# Patient Record
Sex: Male | Born: 1935 | Race: White | Hispanic: No | Marital: Married | Smoking: Former smoker
Health system: Southern US, Community
[De-identification: ages and names within clinical notes are randomized; demographics above are authoritative.]

## PROBLEM LIST (undated history)

## (undated) DIAGNOSIS — Z9841 Cataract extraction status, right eye: Secondary | ICD-10-CM

## (undated) DIAGNOSIS — N138 Other obstructive and reflux uropathy: Secondary | ICD-10-CM

## (undated) DIAGNOSIS — N433 Hydrocele, unspecified: Secondary | ICD-10-CM

## (undated) DIAGNOSIS — M1990 Unspecified osteoarthritis, unspecified site: Secondary | ICD-10-CM

## (undated) DIAGNOSIS — N3941 Urge incontinence: Secondary | ICD-10-CM

## (undated) DIAGNOSIS — N401 Enlarged prostate with lower urinary tract symptoms: Secondary | ICD-10-CM

## (undated) DIAGNOSIS — I499 Cardiac arrhythmia, unspecified: Secondary | ICD-10-CM

## (undated) DIAGNOSIS — I219 Acute myocardial infarction, unspecified: Secondary | ICD-10-CM

## (undated) DIAGNOSIS — E785 Hyperlipidemia, unspecified: Secondary | ICD-10-CM

## (undated) DIAGNOSIS — N289 Disorder of kidney and ureter, unspecified: Secondary | ICD-10-CM

## (undated) DIAGNOSIS — Z9842 Cataract extraction status, left eye: Secondary | ICD-10-CM

## (undated) DIAGNOSIS — I200 Unstable angina: Secondary | ICD-10-CM

## (undated) DIAGNOSIS — N189 Chronic kidney disease, unspecified: Secondary | ICD-10-CM

## (undated) DIAGNOSIS — J810 Acute pulmonary edema: Secondary | ICD-10-CM

## (undated) DIAGNOSIS — R3915 Urgency of urination: Principal | ICD-10-CM

## (undated) DIAGNOSIS — I639 Cerebral infarction, unspecified: Secondary | ICD-10-CM

## (undated) DIAGNOSIS — I10 Essential (primary) hypertension: Secondary | ICD-10-CM

## (undated) DIAGNOSIS — I509 Heart failure, unspecified: Secondary | ICD-10-CM

## (undated) DIAGNOSIS — R0600 Dyspnea, unspecified: Secondary | ICD-10-CM

## (undated) DIAGNOSIS — F419 Anxiety disorder, unspecified: Secondary | ICD-10-CM

## (undated) HISTORY — DX: Cerebral infarction, unspecified: I63.9

## (undated) HISTORY — PX: HERNIA REPAIR: SHX51

## (undated) HISTORY — DX: Benign prostatic hyperplasia with lower urinary tract symptoms: N40.1

## (undated) HISTORY — PX: VASECTOMY: SHX75

## (undated) HISTORY — PX: TONSILLECTOMY: SUR1361

## (undated) HISTORY — DX: Heart failure, unspecified: I50.9

## (undated) HISTORY — DX: Essential (primary) hypertension: I10

## (undated) HISTORY — DX: Unstable angina: I20.0

## (undated) HISTORY — DX: Hyperlipidemia, unspecified: E78.5

## (undated) HISTORY — DX: Chronic kidney disease, unspecified: N18.9

## (undated) HISTORY — DX: Acute pulmonary edema: J81.0

## (undated) HISTORY — PX: EYE SURGERY: SHX253

## (undated) HISTORY — DX: Urge incontinence: N39.41

## (undated) HISTORY — DX: Urgency of urination: R39.15

## (undated) HISTORY — DX: Hydrocele, unspecified: N43.3

## (undated) HISTORY — PX: TONSILLECTOMY: SHX5217

## (undated) HISTORY — DX: Other obstructive and reflux uropathy: N13.8

---
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
## 1993-05-27 HISTORY — PX: UMBILICAL HERNIA REPAIR: SHX196

## 2004-05-27 HISTORY — PX: ANKLE FRACTURE SURGERY: SHX122

## 2006-09-04 ENCOUNTER — Ambulatory Visit: Payer: Self-pay | Admitting: Orthopaedic Surgery

## 2010-03-29 ENCOUNTER — Emergency Department: Payer: Self-pay | Admitting: Emergency Medicine

## 2012-02-17 ENCOUNTER — Ambulatory Visit: Payer: Self-pay

## 2012-02-24 DIAGNOSIS — N138 Other obstructive and reflux uropathy: Secondary | ICD-10-CM

## 2012-02-24 HISTORY — DX: Benign prostatic hyperplasia with lower urinary tract symptoms: N13.8

## 2012-02-25 DIAGNOSIS — N433 Hydrocele, unspecified: Secondary | ICD-10-CM

## 2012-02-25 HISTORY — DX: Hydrocele, unspecified: N43.3

## 2012-04-24 DIAGNOSIS — I639 Cerebral infarction, unspecified: Secondary | ICD-10-CM

## 2012-04-24 HISTORY — DX: Cerebral infarction, unspecified: I63.9

## 2012-04-28 ENCOUNTER — Encounter (INDEPENDENT_AMBULATORY_CARE_PROVIDER_SITE_OTHER): Payer: Medicare Other | Admitting: Ophthalmology

## 2012-04-28 DIAGNOSIS — H3400 Transient retinal artery occlusion, unspecified eye: Secondary | ICD-10-CM

## 2012-04-28 DIAGNOSIS — H43819 Vitreous degeneration, unspecified eye: Secondary | ICD-10-CM

## 2012-04-28 DIAGNOSIS — H35039 Hypertensive retinopathy, unspecified eye: Secondary | ICD-10-CM

## 2012-04-28 DIAGNOSIS — H34239 Retinal artery branch occlusion, unspecified eye: Secondary | ICD-10-CM

## 2012-04-28 DIAGNOSIS — I10 Essential (primary) hypertension: Secondary | ICD-10-CM

## 2012-04-30 ENCOUNTER — Ambulatory Visit: Payer: Self-pay

## 2012-05-04 ENCOUNTER — Ambulatory Visit: Payer: Self-pay

## 2012-05-04 LAB — CREATININE, SERUM: EGFR (Non-African Amer.): 40 — ABNORMAL LOW

## 2012-05-11 ENCOUNTER — Ambulatory Visit: Payer: Self-pay

## 2012-05-28 ENCOUNTER — Inpatient Hospital Stay: Payer: Self-pay

## 2012-05-28 LAB — COMPREHENSIVE METABOLIC PANEL
Alkaline Phosphatase: 82 U/L (ref 50–136)
BUN: 23 mg/dL — ABNORMAL HIGH (ref 7–18)
Bilirubin,Total: 0.8 mg/dL (ref 0.2–1.0)
Co2: 29 mmol/L (ref 21–32)
Creatinine: 1.61 mg/dL — ABNORMAL HIGH (ref 0.60–1.30)
EGFR (African American): 45 — ABNORMAL LOW
SGOT(AST): 27 U/L (ref 15–37)
SGPT (ALT): 30 U/L (ref 12–78)
Total Protein: 7.5 g/dL (ref 6.4–8.2)

## 2012-05-28 LAB — CBC
HCT: 45.9 % (ref 40.0–52.0)
HGB: 15.6 g/dL (ref 13.0–18.0)
MCV: 92 fL (ref 80–100)
RDW: 14.3 % (ref 11.5–14.5)
WBC: 10.3 10*3/uL (ref 3.8–10.6)

## 2012-05-28 LAB — URINALYSIS, COMPLETE
Bacteria: NONE SEEN
Glucose,UR: NEGATIVE mg/dL (ref 0–75)
Hyaline Cast: 3
Leukocyte Esterase: NEGATIVE
Nitrite: NEGATIVE
Ph: 5 (ref 4.5–8.0)
Protein: NEGATIVE
RBC,UR: NONE SEEN /HPF (ref 0–5)
Specific Gravity: 1.008 (ref 1.003–1.030)
Squamous Epithelial: NONE SEEN
WBC UR: 2 /HPF (ref 0–5)

## 2012-05-28 LAB — APTT: Activated PTT: >160 secs (ref 23.6–35.9)

## 2012-05-28 LAB — PROTIME-INR: Prothrombin Time: 13.3 secs (ref 11.5–14.7)

## 2012-05-28 LAB — TROPONIN I
Troponin-I: 0.09 ng/mL — ABNORMAL HIGH
Troponin-I: 0.06 ng/mL — ABNORMAL HIGH

## 2012-05-28 LAB — CK TOTAL AND CKMB (NOT AT ARMC): CK, Total: 157 U/L (ref 35–232)

## 2012-05-29 LAB — CBC WITH DIFFERENTIAL/PLATELET
Basophil #: 0 10*3/uL (ref 0.0–0.1)
Eosinophil #: 0.1 10*3/uL (ref 0.0–0.7)
HCT: 40.5 % (ref 40.0–52.0)
HGB: 13.7 g/dL (ref 13.0–18.0)
Lymphocyte #: 2 10*3/uL (ref 1.0–3.6)
MCH: 31.1 pg (ref 26.0–34.0)
MCV: 92 fL (ref 80–100)
Monocyte #: 0.8 x10 3/mm (ref 0.2–1.0)
Monocyte %: 12 %
Neutrophil %: 55.9 %
Platelet: 158 10*3/uL (ref 150–440)

## 2012-05-29 LAB — BASIC METABOLIC PANEL
Anion Gap: 7 (ref 7–16)
Calcium, Total: 8.4 mg/dL — ABNORMAL LOW (ref 8.5–10.1)
Chloride: 108 mmol/L — ABNORMAL HIGH (ref 98–107)
Co2: 27 mmol/L (ref 21–32)
Creatinine: 1.6 mg/dL — ABNORMAL HIGH (ref 0.60–1.30)
EGFR (African American): 45 — ABNORMAL LOW
EGFR (Non-African Amer.): 39 — ABNORMAL LOW
Glucose: 96 mg/dL (ref 65–99)
Potassium: 3.7 mmol/L (ref 3.5–5.1)

## 2012-05-29 LAB — LIPID PANEL
Cholesterol: 128 mg/dL (ref 0–200)
HDL Cholesterol: 54 mg/dL (ref 40–60)

## 2012-05-29 LAB — TROPONIN I: Troponin-I: 0.06 ng/mL — ABNORMAL HIGH

## 2012-05-29 LAB — PRO B NATRIURETIC PEPTIDE: B-Type Natriuretic Peptide: 3833 pg/mL — ABNORMAL HIGH (ref 0–450)

## 2012-05-30 LAB — BASIC METABOLIC PANEL
Co2: 28 mmol/L (ref 21–32)
EGFR (African American): 44 — ABNORMAL LOW
Osmolality: 285 (ref 275–301)
Sodium: 140 mmol/L (ref 136–145)

## 2012-07-20 ENCOUNTER — Encounter (INDEPENDENT_AMBULATORY_CARE_PROVIDER_SITE_OTHER): Payer: Medicare Other | Admitting: Ophthalmology

## 2012-07-20 DIAGNOSIS — H35039 Hypertensive retinopathy, unspecified eye: Secondary | ICD-10-CM

## 2012-07-20 DIAGNOSIS — I10 Essential (primary) hypertension: Secondary | ICD-10-CM

## 2012-07-20 DIAGNOSIS — H5340 Unspecified visual field defects: Secondary | ICD-10-CM

## 2012-07-20 DIAGNOSIS — H34219 Partial retinal artery occlusion, unspecified eye: Secondary | ICD-10-CM

## 2012-09-17 ENCOUNTER — Encounter (INDEPENDENT_AMBULATORY_CARE_PROVIDER_SITE_OTHER): Payer: Medicare Other | Admitting: Ophthalmology

## 2012-09-17 DIAGNOSIS — H43819 Vitreous degeneration, unspecified eye: Secondary | ICD-10-CM

## 2012-09-17 DIAGNOSIS — H35039 Hypertensive retinopathy, unspecified eye: Secondary | ICD-10-CM

## 2012-09-17 DIAGNOSIS — H34219 Partial retinal artery occlusion, unspecified eye: Secondary | ICD-10-CM

## 2012-09-17 DIAGNOSIS — I10 Essential (primary) hypertension: Secondary | ICD-10-CM

## 2013-01-08 ENCOUNTER — Ambulatory Visit: Payer: Self-pay

## 2013-02-26 DIAGNOSIS — I200 Unstable angina: Secondary | ICD-10-CM

## 2013-02-26 DIAGNOSIS — J810 Acute pulmonary edema: Secondary | ICD-10-CM | POA: Insufficient documentation

## 2013-02-26 HISTORY — DX: Acute pulmonary edema: J81.0

## 2013-02-26 HISTORY — DX: Unstable angina: I20.0

## 2014-02-16 DIAGNOSIS — N184 Chronic kidney disease, stage 4 (severe): Secondary | ICD-10-CM | POA: Insufficient documentation

## 2014-02-16 DIAGNOSIS — N189 Chronic kidney disease, unspecified: Secondary | ICD-10-CM

## 2014-02-16 HISTORY — DX: Chronic kidney disease, unspecified: N18.9

## 2014-05-30 DIAGNOSIS — N400 Enlarged prostate without lower urinary tract symptoms: Secondary | ICD-10-CM | POA: Insufficient documentation

## 2014-05-30 DIAGNOSIS — N3941 Urge incontinence: Secondary | ICD-10-CM

## 2014-05-30 HISTORY — DX: Urge incontinence: N39.41

## 2014-09-16 NOTE — Discharge Summary (Signed)
PATIENT NAME:  Ruben Hudson, Ruben Hudson MR#:  [id number] DATE OF BIRTH:  10-Apr-1926  DATE OF ADMISSION:  05/28/2012 DATE OF DISCHARGE:  05/30/2012  DISCHARGE DIAGNOSES:   1.  Acute exacerbation of congestive heart failure.  2.  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  3.  History of duodenal ulcer.  4.  Renal insufficiency with baseline creatinine of 1.6.  5.  Hypertension.  6.  Osteoporosis.   DISCHARGE MEDICATIONS:  1.  Lasix 40 mg once daily.  2.  Atorvastatin 10 mg daily.  3.  Lisinopril 10 mg daily.  4.  Metoprolol succinate ER 25 mg daily.  5.  Multivitamin 1 tab daily.  6.  Aspirin 325 mg daily.  7.  Prilosec 20 mg daily.  8.  Flomax 0.4 mg daily.   HISTORY OF PRESENT ILLNESS: An 81 year old male who presented with acute onset shortness of breath. He was found to be hypoxic. He had a borderline elevation of troponin of 0.09. He was treated in the ED initially with aspirin, heparin drip, Lasix and nitroglycerin patch for evidence of congestive heart failure.   HOSPITAL COURSE: The patient was admitted. Cardiology was consulted. He had previously known CHF with an echocardiogram recently showing EF of 35% to 40% with anteroseptal hypokinesis, mild MR and mild AS. He was seen initially by Dr. Saralyn Pilar. It was recommended he be started on IV heparin, which he received for about 24 hours. He was continued on his beta blocker. With diuresis, he had symptomatic improvement. Trend of cardiac enzymes showed troponin of 0.06 and 0.06 on monitoring. Remainder of labs remained fairly stable. He does have chronic renal insufficiency with baseline creatinine of 1.6, which remained unchanged. On discharge, only new medication was Lasix.   DISCHARGE INSTRUCTIONS:  1.  Start Lasix 40 mg daily.  2.  Can resume all prior medicines.  3.  Follow up with Dr. Ubaldo Glassing in 1 to 2 weeks. The patient is to call to schedule appointment. 4.  Follow up with Dr. Ola Spurr in 1 to 2 weeks. The patient is to call  to schedule appointment. 5.  Follow up with Dr. Manuella Ghazi, neurology, at [facility] on January 22 as previously scheduled.    ____________________________ A. Lavone Orn, MD ams:jm D: 05/30/2012 09:22:48 ET T: 05/30/2012 15:31:51 ET JOB#: [id number]  cc: A. Lavone Orn, MD, <Dictator> Adrian Prows, MD Bartholome Bill, MD Gracy Bruins SOLUM MD ELECTRONICALLY SIGNED 06/01/2012 16:37

## 2014-09-16 NOTE — Consult Note (Signed)
PATIENT NAME:  Ruben Hudson, Ruben Hudson MR#:  [id number] DATE OF BIRTH:  1925/12/12  DATE OF CONSULTATION:  05/28/2012  REFERRING PHYSICIAN:   CONSULTING PHYSICIAN:  Isaias Cowman, MD PRIMARY CARE PHYSICIAN: Cheral Marker. Ola Spurr, MD  CHIEF COMPLAINT: Shortness of breath.   REASON FOR CONSULTATION: Consultation requested for evaluation of congestive heart failure.   HISTORY OF PRESENT ILLNESS: The patient is an 81 year old gentleman admitted at this time with symptoms consistent with congestive heart failure. The patient reports that he was in his usual state of health until approximately 2:00 a.m. when he noted shortness of breath. The patient got out of bed, sat up in a chair but continued to experience worsening shortness of breath. EMS was called. The patient was taken to [facility] ER. EKG revealed baseline left bundle branch block. The patient was in acute respiratory failure consistent with congestive heart failure. The patient was treated with intravenous furosemide with diuresis and clinical improvement. The patient did not experience any chest pain and denies chest pain. The patient was transferred to [facility] Emergency Room where he currently denies chest pain or shortness of breath. Troponin is 0.09.   PAST MEDICAL HISTORY:  1.  Status post CVA with loss of vision in the right eye.  2.  Hypertension.  3.  Chronic kidney disease.   MEDICATIONS: Aspirin 325 mg daily, atorvastatin 10 mg at bedtime, lisinopril 20 mg daily, metoprolol succinate 25 mg daily, multivitamin 1 daily, omeprazole 20 mg daily, Flomax 0.4 mg daily.   SOCIAL HISTORY: The patient has a remote tobacco abuse history. He is married, lives with his wife. He is a retired Chief Financial Officer.   FAMILY HISTORY: No immediate family history for coronary disease or myocardial infarction.   REVIEW OF SYSTEMS:    CONSTITUTIONAL: No fever or chills.  EYES: Decreased vision in right eye as stated above.  EARS: No hearing loss.   RESPIRATORY: Shortness of breath and orthopnea as described above.  CARDIOVASCULAR: No chest pain.  GASTROINTESTINAL: No nausea, vomiting or diarrhea.  GENITOURINARY: No dysuria or hematuria.  ENDOCRINE: No polyuria or polydipsia.  INTEGUMENTARY: No rash.  MUSCULOSKELETAL: No arthralgias or myalgias.  NEUROLOGICAL: Loss of vision in right eye as described above.  PSYCHOLOGICAL: No depression or anxiety.   PHYSICAL EXAMINATION:  VITAL SIGNS: Blood pressure 148/60, pulse 75, respirations 20, temperature 97.6, pulse oximetry 98%.  HEENT: Pupils equal and reactive to light and accommodation.  NECK: Supple without thyromegaly.  LUNGS: Clear.  HEART: Normal JVP. Normal PMI. Regular rate and rhythm. Normal S1, S2. No appreciable gallop, murmur or rub.  ABDOMEN: Soft and nontender.  EXTREMITIES: Pulses were intact bilaterally.  MUSCULOSKELETAL: Normal muscle tone.  NEUROLOGICAL: The patient is alert and oriented x 3. Motor and sensory both grossly intact.   IMPRESSION: This is an 81 year old gentleman with recent loss of vision in right eye presumed to be due to cerebrovascular accident, with recent echocardiogram showing moderately reduced left ventricular function with a left ventricular ejection fraction of 35% to 40%, who presents with symptoms consistent with congestive heart failure which appear to have resolved after intravenous dose of furosemide, with borderline elevated troponin in the absence of chest pain, ECG shows baseline left bundle branch block. Discussed at length with the patient and the patient's wife about conservative versus aggressive approach. At this time, will pursue initial conservative management, continue angiotensin-converting enzyme inhibitor and beta blocker with the addition of maintenance furosemide. May consider further cardiac workup as outpatient in 1 to 2 weeks per Dr.  Fath.   RECOMMENDATIONS:  1.  Agree with overall current therapy.  2.  Continue heparin for  24 to 48 hours.  3.  Continue diuresis with furosemide.  4.  Up-titrate metoprolol succinate.  5.  Defer initial invasive strategy.    ____________________________ Isaias Cowman, MD ap:jm D: 05/28/2012 16:25:43 ET T: 05/28/2012 18:24:16 ET JOB#: [id number]  cc: Isaias Cowman, MD, <Dictator> Isaias Cowman MD ELECTRONICALLY SIGNED 06/12/2012 8:47

## 2014-09-16 NOTE — H&P (Signed)
PATIENT NAME:  Ruben, Hudson MR#:  [id number] DATE OF BIRTH:  Apr 04, 1926  DATE OF ADMISSION:  05/28/2012  PRIMARY CARE PHYSICIAN: Ruben Prows, MD  CARDIOLOGIST: Ruben Bill, MD  CHIEF COMPLAINT: Shortness of breath.   HISTORY OF PRESENT ILLNESS: This is an 81 year old man who last night started having trouble breathing. He could not sleep. He got up around 3 or 4:00 a.m. It got worse and worse. He was gurgling on secretions. His respiratory rate got faster and faster. He called 911. He was taken over to [facility]. There he was given aspirin, heparin drip, Lasix and nitro patch and felt to be in acute respiratory failure and treated for congestive heart failure and found to have an elevated troponin. Since his cardiologist was here, and ER to ER transfer was set up. The patient did not complain of any chest pain, no sweating and no nausea or vomiting. He just feels tired now. His breathing is much improved. Here in the Emergency Room, he had a borderline troponin of 0.09 and when I walked in he was on 4 liters of oxygen, now breathing more comfortably.   PAST MEDICAL HISTORY: 1. 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 2. History of duodenal ulcer in the past.  3. Varicocele. 4. Chronic kidney disease. 5. Hypertension. 6. Osteoporosis.   PAST SURGICAL HISTORY:  1. Cataracts. 2. Right ankle surgery. 3. Hernia surgery. 4. Vasectomy.   ALLERGIES: No known drug allergies.   MEDICATIONS: (As per prescription writer)  1. Aspirin 325 mg daily.  2. Atorvastatin 10 mg at bedtime.  3. Lisinopril 20 mg daily.  4. Metoprolol tartrate ER 25 mg daily.  5. Multivitamin daily.  6. Omeprazole 20 mg daily.  7. Flomax 0.4 mg daily.   SOCIAL HISTORY: Quit smoking 58 years ago. Does drink a shot of alcohol per day. 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   FAMILY HISTORY: Father died at 62 of heart related issues. Mother died about age 18 of Alzheimer's.   REVIEW OF  SYSTEMS:   CONSTITUTIONAL: Positive for fatigue. No fever, chills, or sweats. No weight loss. No weight gain.   EYES: Right eye CVA with decreased vision.   EARS, NOSE, MOUTH, AND THROAT: Positive for postnasal drip and nasal congestion. No sore throat. No difficulty swallowing.   CARDIOVASCULAR: No chest pain. Occasional palpitations.   RESPIRATORY: Positive for shortness of breath. No cough. No sputum. No hemoptysis.   GASTROINTESTINAL: No nausea. No vomiting. No abdominal pain. No diarrhea. No constipation. No bright red blood per rectum. No melena.  GENITOURINARY: Constant going to the bathroom and dribbling.   MUSCULOSKELETAL: Positive for cramp in the right leg.   INTEGUMENTARY: Positive for itching on his back.   NEUROLOGIC: No fainting or blackouts.   PSYCHIATRIC: No anxiety or depression.   ENDOCRINE: No thyroid problems.   HEMATOLOGIC/LYMPHATIC: No anemia. No easy bruising or bleeding.   PHYSICAL EXAMINATION:  VITAL SIGNS: On presentation pulse was 73, respirations 18, blood pressure 127/77 and pulse oximetry 99% on 4 liters of oxygen.   GENERAL: Now currently no respiratory distress.   EYES: Conjunctivae and lids normal. Pupils equal, round, and reactive to light. Extraocular muscles intact. No nystagmus.   EARS, NOSE, MOUTH, AND THROAT: Tympanic membranes no erythema. Nasal mucosa no erythema. Throat no erythema. No exudate seen. Lips and gums no lesions.   NECK: No JVD. No bruits. No lymphadenopathy. No thyromegaly. No thyroid nodules palpated.  RESPIRATORY: Decreased breath sounds in bilateral bases. No rhonchi, rales or wheeze heard. Currently not using accessory muscles to breathe.   CARDIOVASCULAR: S1, S2 normal. No gallops or rubs heard. II/VI systolic ejection murmur. Carotid upstroke 2+ bilaterally. No bruits. Dorsalis pedis pulses 2+ bilaterally. No edema of the lower extremities.   ABDOMEN: Soft, nontender. No organosplenomegaly. Normoactive bowel  sounds. No masses felt.   LYMPHATIC: No lymph nodes in the neck.   MUSCULOSKELETAL: No clubbing. No cyanosis on oxygen. No edema.   SKIN: No rashes or ulcers seen.   NEUROLOGIC: Cranial nerves II through XII grossly intact. Deep tendon reflexes 2+ bilateral lower extremities.   PSYCHIATRIC: The patient is oriented to person, place and time.   LABORATORY AND RADIOLOGICAL DATA: Chest x-ray showed hyperinflation. No infiltrate.  PTT greater than 160. Glucose 102, BUN 23, creatinine 1.61, sodium 143, potassium 3.8, chloride 108, CO2 29 and calcium 8.9. Liver function tests normal range. White blood cell count 10.3, hemoglobin and hematocrit 15.6 and 45.9 and platelet count 199. INR 1.0. Troponin borderline at 0.09.   Urinalysis: 1+ blood, otherwise negative.   EKG: Left bundle branch block.   ASSESSMENT AND PLAN: 1. Acute respiratory failure on presentation to [facility]. Arrived here at [facility] on 4 liters of oxygen. We will try to taper off oxygen since saturations are better now.  2. Acute systolic congestive heart failure. The patient did have an echocardiogram recently at Ruben Hudson office and showed an EF of 35% to 40% with anteroseptal hypokinesis, right ventricular systolic pressure elevated, mild to moderate MR and mild aortic stenosis. We will give IV Lasix 20 mg IV 2 times a day, continue Toprol and lisinopril and continue to monitor clinically.  3. Elevated troponin. We will cycle cardiac enzymes. Could be secondary to the respiratory failure and heart failure.  4. Chronic kidney disease. Creatinine at [facility] Hudson office recently was 1.6; right now it is 1.61. We will continue to watch closely with diuresis.  5. Hypertension. Blood pressure is currently controlled. Nitro paste was added for the heart failure also. We will continue to watch blood pressure.  6. History of cerebrovascular accident with right visual loss. Continue aspirin.         7. Hudson consultation will be obtained from [facility]. I will not repeat an echocardiogram since it was recently done in their office.  TIME SPENT ON ADMISSION: 55 minutes.  ____________________________ Tana Conch. Leslye Peer, MD rjw:sb D: 05/28/2012 13:43:45 ET T: 05/28/2012 14:11:55 ET JOB#: [id number]  cc: Tana Conch. Leslye Peer, MD, <Dictator> Cheral Marker. Ola Spurr, MD Javier Docker Ubaldo Glassing, MD Marisue Brooklyn MD ELECTRONICALLY SIGNED 05/28/2012 19:09

## 2014-09-16 NOTE — Consult Note (Signed)
Brief Consult Note: Diagnosis: CHF, borderline elevated troponin, no CP.   Patient was seen by consultant.   Consult note dictated.   Comments: REC  Agree with current therapy, cont hep 24-48h, uptitrate metop succ, cont diuresis, defer initial invasive strategy.  Electronic Signatures: Isaias Cowman (MD)  (Signed 02-Jan-14 16:26)  Authored: Brief Consult Note   Last Updated: 02-Jan-14 16:26 by Isaias Cowman (MD)

## 2015-10-31 ENCOUNTER — Telehealth: Payer: Self-pay | Admitting: Radiology

## 2015-10-31 ENCOUNTER — Ambulatory Visit (INDEPENDENT_AMBULATORY_CARE_PROVIDER_SITE_OTHER): Payer: Medicare Other | Admitting: Urology

## 2015-10-31 ENCOUNTER — Encounter: Payer: Self-pay | Admitting: Urology

## 2015-10-31 VITALS — BP 116/60 | HR 71 | Ht 69.0 in | Wt 151.0 lb

## 2015-10-31 DIAGNOSIS — N183 Chronic kidney disease, stage 3 unspecified: Secondary | ICD-10-CM

## 2015-10-31 DIAGNOSIS — N400 Enlarged prostate without lower urinary tract symptoms: Secondary | ICD-10-CM | POA: Diagnosis not present

## 2015-10-31 DIAGNOSIS — N433 Hydrocele, unspecified: Secondary | ICD-10-CM

## 2015-10-31 DIAGNOSIS — N528 Other male erectile dysfunction: Secondary | ICD-10-CM

## 2015-10-31 DIAGNOSIS — I639 Cerebral infarction, unspecified: Secondary | ICD-10-CM | POA: Insufficient documentation

## 2015-10-31 DIAGNOSIS — I10 Essential (primary) hypertension: Secondary | ICD-10-CM

## 2015-10-31 DIAGNOSIS — E785 Hyperlipidemia, unspecified: Secondary | ICD-10-CM

## 2015-10-31 DIAGNOSIS — R3915 Urgency of urination: Secondary | ICD-10-CM

## 2015-10-31 DIAGNOSIS — N529 Male erectile dysfunction, unspecified: Secondary | ICD-10-CM

## 2015-10-31 DIAGNOSIS — N471 Phimosis: Secondary | ICD-10-CM | POA: Insufficient documentation

## 2015-10-31 DIAGNOSIS — I509 Heart failure, unspecified: Secondary | ICD-10-CM

## 2015-10-31 HISTORY — DX: Hyperlipidemia, unspecified: E78.5

## 2015-10-31 HISTORY — DX: Essential (primary) hypertension: I10

## 2015-10-31 HISTORY — DX: Urgency of urination: R39.15

## 2015-10-31 HISTORY — DX: Heart failure, unspecified: I50.9

## 2015-10-31 NOTE — Progress Notes (Addendum)
10/31/2015 9:49 AM   Ruben Hudson 05/14/1926 [id number]  Referring provider: No referring provider defined for this encounter.  Chief Complaint  Patient presents with  . Phimosis    New Patient    HPI:  1 - Lower Urinary Tract Sympotms -  Pt with well controlled modest obstructive LUTS on daily tamsulosin. Now minimal bother.   2 - Stage 3 Chronic Kidney Disease - Cr 1.5 / GFR 40s by PCP labs 2017. Prior renal imaging w/o hydro  3 - Phimosis - pt with increasing bother from moderate phimosis x few years. Interfearing with hygeine and ability to void from standing. Exam with approx 56mm open phimotic ring. He is bothored and wants therapy.  4 - Left > Right Hydroceles - about 500 mL left, 100 mL right estimated vol hydroceles present x years. Remote h/o left inguinal hernia repair. Prior US many years ago that he thinks confirmed hydrocle but not sure. Also significnat bother and has to wear compression underwear in order to do yard work. Also wante repair.  5 - Erectile Dysfunction  - years of progressive decline in ability to achieve and maintain erection. Unaided gets essentially zero erection even with intense stimulation. Libido excellent. No prior therapy. After discussion of options h e opts for observation.   PMH sig for CHF/Lasix (not limiting whatsoever). His PCP is Adrian Prows MD with [facility].   Today  " Ruben Hudson " is seen as new patient for above.    PMH: No past medical history on file.  Surgical History: No past surgical history on file.  Home Medications:    Medication List    Notice  As of 10/31/2015  9:49 AM   You have not been prescribed any medications.      Allergies: Allergies not on file  Family History: No family history on file.  Social History:  has no tobacco, alcohol, and drug history on file.     Review of Systems  Gastrointestinal (upper)  : Negative for upper GI symptoms  Gastrointestinal (lower) : Negative for lower GI  symptoms  Constitutional : Negative for symptoms  Skin: Negative for skin symptoms  Eyes: Negative for eye symptoms  Ear/Nose/Throat : Negative for Ear/Nose/Throat symptoms  Hematologic/Lymphatic: Negative for Hematologic/Lymphatic symptoms  Cardiovascular : Negative for cardiovascular symptoms  Respiratory : Negative for respiratory symptoms  Endocrine: Negative for endocrine symptoms  Musculoskeletal: Negative for musculoskeletal symptoms  Neurological: Negative for neurological symptoms  Psychologic: Negative for psychiatric symptoms    Physical Exam: There were no vitals taken for this visit.  Constitutional:  Alert and oriented, No acute distress. Very vigorous for age.  HEENT: [place] AT, moist mucus membranes.  Trachea midline, no masses. Cardiovascular: No clubbing, cyanosis, or edema. Respiratory: Normal respiratory effort, no increased work of breathing. GI: Abdomen is soft, nontender, nondistended, no abdominal masses GU: No CVA tenderness. Phiosis with 80mm ring. Good hygeine at present. Left > Right hydroceles. No palpable firm scortal masses or bowel contents.  Skin: No rashes, bruises or suspicious lesions. Lymph: No cervical or inguinal adenopathy. Neurologic: Grossly intact, no focal deficits, moving all 4 extremities. Psychiatric: Normal mood and affect.  Laboratory Data: Lab Results  Component Value Date   WBC 6.7 05/29/2012   HGB 13.7 05/29/2012   HCT 40.5 05/29/2012   MCV 92 05/29/2012   PLT 158 05/29/2012    Lab Results  Component Value Date   CREATININE 1.63* 05/30/2012    No results found for: PSA  No results  found for: TESTOSTERONE  No results found for: HGBA1C  Urinalysis    Component Value Date/Time   COLORURINE Straw 05/28/2012 1120   APPEARANCEUR Clear 05/28/2012 1120   LABSPEC 1.008 05/28/2012 1120   PHURINE 5.0 05/28/2012 1120   GLUCOSEU Negative 05/28/2012 1120   HGBUR 1+ 05/28/2012 1120   BILIRUBINUR Negative  05/28/2012 1120   KETONESUR Negative 05/28/2012 1120   PROTEINUR Negative 05/28/2012 1120   NITRITE Negative 05/28/2012 1120   LEUKOCYTESUR Negative 05/28/2012 1120      Assessment & Plan:    1 - Lower Urinary Tract Sympotms -  Well controlled on tamsulosin, continue.   2 - Stage 3 Chronic Kidney Disease - likely medical renal disease. Reinforced importance of BP control.  3 - Phimosis - discussed optoins of observation / steroid cream v. Dorsal slit v. circ and he wants circ. I agree given his level of bother. Risks, benefits, alternatives, peri-op course discussed in detail as well as possible chance in penile sensation. Will stop ASA piror as for primary prevention.   4 - Left > Right Hydroceles - discussed observation, cleroterapy, hydrocelectomy and he would like bilateral hydrocelectomy at time of circ above. i agree. Given size discussed he will likely have peri-op penrose drain to be removed in ofice 3-5 days post-op and he is agreeable. [place] prior to confirm etiology.   5 - Erectile Dysfunction  - severe. Discussed frankly that it would likely take injeciton meds (trimix) or surgery with prosthesis to allow coitus. He declines.   6 - RTC few days after circumcision / hydrocelectomy.       No Follow-up on file.  Alexis Frock, MD  [facility] [address] [place], [place] [address] [phone]    SCROTAL ULTRASOUND: Surface probe images obtained of scrotum. Confirms large left, small right hydrocele. NO hernias / bowel contents. NO large varicose veins.

## 2015-10-31 NOTE — Telephone Encounter (Signed)
Notified pt's wife of surgery scheduled 11/13/15, pre-admit testing appt on 6/9 @11 :15 and to call Friday prior to surgery for arrival time to SDS. Wife voices understanding.

## 2015-11-02 ENCOUNTER — Ambulatory Visit: Payer: Medicare Other

## 2015-11-03 ENCOUNTER — Encounter
Admission: RE | Admit: 2015-11-03 | Discharge: 2015-11-03 | Disposition: A | Discharge status: Home / Self Care | Payer: Medicare Other | Source: Ambulatory Visit | Attending: Urology | Admitting: Urology

## 2015-11-03 DIAGNOSIS — Z01812 Encounter for preprocedural laboratory examination: Secondary | ICD-10-CM | POA: Diagnosis present

## 2015-11-03 HISTORY — DX: Cataract extraction status, right eye: Z98.41

## 2015-11-03 HISTORY — DX: Cataract extraction status, left eye: Z98.42

## 2015-11-03 LAB — CBC
HCT: 39.9 % — ABNORMAL LOW (ref 40.0–52.0)
Hemoglobin: 13.2 g/dL (ref 13.0–18.0)
MCH: 31.3 pg (ref 26.0–34.0)
MCHC: 33 g/dL (ref 32.0–36.0)
MCV: 95 fL (ref 80.0–100.0)
PLATELETS: 167 10*3/uL (ref 150–440)
RBC: 4.21 MIL/uL — AB (ref 4.40–5.90)
RDW: 15 % — ABNORMAL HIGH (ref 11.5–14.5)
WBC: 5.2 10*3/uL (ref 3.8–10.6)

## 2015-11-03 LAB — BASIC METABOLIC PANEL
Anion gap: 6 (ref 5–15)
BUN: 28 mg/dL — AB (ref 6–20)
CO2: 28 mmol/L (ref 22–32)
Calcium: 9.6 mg/dL (ref 8.9–10.3)
Chloride: 106 mmol/L (ref 101–111)
Creatinine, Ser: 1.73 mg/dL — ABNORMAL HIGH (ref 0.61–1.24)
GFR calc Af Amer: 39 mL/min — ABNORMAL LOW (ref >60)
GFR, EST NON AFRICAN AMERICAN: 34 mL/min — AB (ref >60)
GLUCOSE: 97 mg/dL (ref 65–99)
POTASSIUM: 5.5 mmol/L — AB (ref 3.5–5.1)
Sodium: 140 mmol/L (ref 135–145)

## 2015-11-03 NOTE — Pre-Procedure Instructions (Signed)
Sinus bradycardia Nonspecific intraventricular block Cannot rule out Septal infarct , age undetermined T wave abnormality, consider inferior ischemia Abnormal ECG No previous ECGs available I reviewed and concur with this report. Electronically signed [id number] MD, KEN ([id number]) on 11/23/2014 8:39:38 PM   58 Hr Holter Monitor7/10/2014  [place]  Result Narrative  Procedure:  48 hour Holter monitor  Indication:  Syncope  Ordering Provider:  Sydnee Levans, MD Interpreting Physician:  Sydnee Levans, MD  Clinical History: 82 y.o. year old male  Hookup Date/Time: 11/17/2014/    Total Duration Recorded: 48 hours 44 minutes  Findings: The minimum heart rate was approximately 52 bpm The maximum heart rate was approximately 114 bpm  The average heart rate was 67 bpm.   There were approximately 3000 premature ventricular beat(s) There were 1 ventricular run(s) noted. The longest ventricular run was 4 beats  There were approximately 300 supraventricular beat(s) There were 1 SVT run(s) noted The longest SVT run was 5 beats at 12:55   Reported symptoms:  No symptoms reported  IMPRESSION:  Sinus rhythm with frequent PACs PVCs.  No sustained arrhythmias.  No pauses.  No symptoms reported. Sydnee Levans, MD

## 2015-11-03 NOTE — Pre-Procedure Instructions (Signed)
Spoke with Ruben Hudson at Dr. Blane Ohara office to confirm the medical clearance has been received, yes it had.

## 2015-11-03 NOTE — Pre-Procedure Instructions (Signed)
Pt's potassium level= 5.5.  Spoke with Dr. Kayleen Memos, medical clearance requested.  Clearance and lab results faxed to Dr. Blane Ohara office.

## 2015-11-03 NOTE — Patient Instructions (Signed)
  Your procedure is scheduled [id number] June 19 , 2017. Report to Same Day Surgery. To find out your arrival time please call [phone] between 1PM - 3PM on Friday November 10, 2015.  Remember: Instructions that are not followed completely may result in serious medical risk, up to and including death, or upon the discretion of your surgeon and anesthesiologist your surgery may need to be rescheduled.    _x___ 1. Do not eat food or drink liquids after midnight. No gum chewing or hard candies.     _x___ 2. No Alcohol for 24 hours before or after surgery.   ____ 3. Bring all medications with you on the day of surgery if instructed.    __x__ 4. Notify your doctor if there is any change in your medical condition     (cold, fever, infections).     Do not wear jewelry, make-up, hairpins, clips or nail polish.  Do not wear lotions, powders, or perfumes. You may wear deodorant.  Do not shave 48 hours prior to surgery. Men may shave face and neck.  Do not bring valuables to the hospital.    [facility] is not responsible for any belongings or valuables.               Contacts, dentures or bridgework may not be worn into surgery.  Leave your suitcase in the car. After surgery it may be brought to your room.  For patients admitted to the hospital, discharge time is determined by your treatment team.   Patients discharged the day of surgery will not be allowed to drive home.    Please read over the following fact sheets that you were given:   [facility] Preparing for Surgery  _x___ Take these medicines the morning of surgery with A SIP OF WATER:    1. atorvastatin (LIPITOR)  2. lisinopril (PRINIVIL,ZESTRIL)  3.   4.  5.  6.  ____ Fleet Enema (as directed)   _x___ Use CHG Soap as directed on instruction sheet  ____ Use inhalers on the day of surgery and bring to hospital day of surgery  ____ Stop metformin 2 days prior to surgery    ____ Take 1/2 of usual insulin dose the night  before surgery and none on the morning of  surgery.   __x__ Stop aspirin 7 days prior to surgery per Dr. Ubaldo Glassing.  ____ Stop Anti-inflammatories such as Advil, Aleve, Ibuprofen, Motrin, Naproxen,  Naprosyn, Goodies powders or aspirin products.   ____ Stop supplements until after surgery.    ____ Bring C-Pap to the hospital.

## 2015-11-03 NOTE — Pre-Procedure Instructions (Signed)
Refaxed medical clearance to Dr. Blane Ohara office, did not receive the 1st fax.

## 2015-11-06 DIAGNOSIS — E875 Hyperkalemia: Secondary | ICD-10-CM | POA: Insufficient documentation

## 2015-11-09 NOTE — Pre-Procedure Instructions (Signed)
RECHECK POTASSIUM 11/08/15 4.5 BY DR Ola Spurr

## 2015-11-10 MED ORDER — LACTATED RINGERS IV SOLN: INTRAVENOUS | Status: DC

## 2015-11-10 NOTE — Pre-Procedure Instructions (Signed)
Called Dr. Bethanne Ginger office for update on cardiac clearance, [facility] is waiting for Dr. Ubaldo Glassing to sign clearance then she will fax it to PAT today.

## 2015-11-10 NOTE — Pre-Procedure Instructions (Signed)
Good exercise tolerance with no exercise-induced arrhythmia or  ischemia.  Moderate reduced LV function EF 32% with a fixed inferior  defect and no reversible ischemia.  Moderate risk study. Result Narrative  CARDIOLOGY DEPARTMENT [facility] [address] [place], [place]  [address] [phone]  Procedure: Exercise Myocardial Perfusion Imaging   ONE day procedure  Indication: Pre-op exam Plan: NM myocardial perfusion SPECT multiple (stress        and rest), ECG stress test only  Chronic systolic congestive heart failure (CMS-HCC) Plan: NM myocardial perfusion SPECT multiple (stress        and rest), ECG stress test only  Ordering Physician:   Dr. Bartholome Bill   Clinical History: 82 y.o. year old male Vitals: Height: 4 in  Weight: 147 lb Cardiac risk factors include:    CVA, CHF, Previous MI and HTN    Procedure: The patient performed treadmill exercise using a Bruce protocol for 4:00  minutes. The exercise test was stopped due to fatigue.  Blood pressure  response was normal.   Rest HR: 63bpm Rest BP: 122/34mmHg Max HR: 125bpm Max BP: 150/56mmHg Mets:     5.20 % MAX HR:   93%  Stress Test Administered by: Oswald Hillock, CMA  ECG Interpretation: Rest ECG:  normal sinus rhythm, none Stress ECG:  sinus tachycardia, nonspecific ST-T wave changes Recovery ECG:  normal sinus rhythm ECG Interpretation:  negative, not assessable due to baseline changes.   Administrations This Visit    technetium Tc40m sestamibi (CARDIOLITE) injection [id number] millicurie    Admin Date Action Dose Route Administered By      [id number] Given [id number] millicurie Intravenous Scott N Goard, CNMT            technetium Tc15m sestamibi (CARDIOLITE) injection 0000000 millicurie    Admin Date Action Dose Route Administered By      [id number] Given 0000000 millicurie Intravenous Scott N Goard, CNMT              Gated post-stress perfusion imaging was  performed 30 minutes after stress.  Rest images were performed 30 minutes after injection.  Gated LV Analysis:  TID:  1.10  LVEF= 32%  FINDINGS: Regional wall motion:  demonstrates  hypokinesis of the Inferior wall. The overall quality of the study is good.   Artifacts noted: no Left ventricular cavity: normal.  Perfusion Analysis:  SPECT images demonstrate small perfusion abnormality  of mild intensity is present in the inferior region on the stress images.   No redistribution   Status     ECG 12-lead6/01/2016  [place]  Component Name Value Range  Vent Rate (bpm) 60   PR Interval (msec) 170   QRS Interval (msec) 144   QT Interval (msec) 456   QTc (msec) 456    Result Narrative  Sinus rhythm with premature atrial complexes Left bundle branch block Abnormal ECG When compared with ECG of 16-Nov-2014 15:11, premature atrial complexes are now present I reviewed and concur with this report. Electronically signed [id number] MD, KEN [id number]) on 11/06/2015 4:47:06 PM    Status Results Details

## 2015-11-10 NOTE — Pre-Procedure Instructions (Signed)
Both cardiac and medical clearances are on the front of pt's chart.

## 2015-11-13 ENCOUNTER — Encounter: Payer: Self-pay | Admitting: *Deleted

## 2015-11-13 ENCOUNTER — Ambulatory Visit: Payer: Medicare Other | Admitting: Anesthesiology

## 2015-11-13 ENCOUNTER — Ambulatory Visit
Admission: RE | Admit: 2015-11-13 | Discharge: 2015-11-13 | Disposition: A | Discharge status: Home / Self Care | Payer: Medicare Other | Source: Ambulatory Visit | Attending: Urology | Admitting: Urology

## 2015-11-13 ENCOUNTER — Encounter
Admission: RE | Disposition: A | Discharge status: Home / Self Care | Payer: Self-pay | Source: Ambulatory Visit | Attending: Urology

## 2015-11-13 DIAGNOSIS — N183 Chronic kidney disease, stage 3 (moderate): Secondary | ICD-10-CM | POA: Diagnosis not present

## 2015-11-13 DIAGNOSIS — I130 Hypertensive heart and chronic kidney disease with heart failure and stage 1 through stage 4 chronic kidney disease, or unspecified chronic kidney disease: Secondary | ICD-10-CM | POA: Diagnosis not present

## 2015-11-13 DIAGNOSIS — Z87891 Personal history of nicotine dependence: Secondary | ICD-10-CM | POA: Diagnosis not present

## 2015-11-13 DIAGNOSIS — Z79899 Other long term (current) drug therapy: Secondary | ICD-10-CM | POA: Diagnosis not present

## 2015-11-13 DIAGNOSIS — N529 Male erectile dysfunction, unspecified: Secondary | ICD-10-CM | POA: Insufficient documentation

## 2015-11-13 DIAGNOSIS — Z79891 Long term (current) use of opiate analgesic: Secondary | ICD-10-CM | POA: Diagnosis not present

## 2015-11-13 DIAGNOSIS — I509 Heart failure, unspecified: Secondary | ICD-10-CM | POA: Diagnosis not present

## 2015-11-13 DIAGNOSIS — Z8673 Personal history of transient ischemic attack (TIA), and cerebral infarction without residual deficits: Secondary | ICD-10-CM | POA: Diagnosis not present

## 2015-11-13 DIAGNOSIS — N471 Phimosis: Secondary | ICD-10-CM | POA: Insufficient documentation

## 2015-11-13 DIAGNOSIS — N433 Hydrocele, unspecified: Secondary | ICD-10-CM | POA: Insufficient documentation

## 2015-11-13 DIAGNOSIS — Z7982 Long term (current) use of aspirin: Secondary | ICD-10-CM | POA: Diagnosis not present

## 2015-11-13 HISTORY — PX: HYDROCELE EXCISION: SHX482

## 2015-11-13 HISTORY — PX: CIRCUMCISION: SHX1350

## 2015-11-13 LAB — POCT I-STAT 4, (NA,K, GLUC, HGB,HCT)
GLUCOSE: 94 mg/dL (ref 65–99)
HCT: 42 % (ref 39.0–52.0)
Hemoglobin: 14.3 g/dL (ref 13.0–17.0)
Potassium: 4.7 mmol/L (ref 3.5–5.1)
Sodium: 141 mmol/L (ref 135–145)

## 2015-11-13 SURGERY — CIRCUMCISION, ADULT
Anesthesia: General | Wound class: Clean Contaminated

## 2015-11-13 MED ORDER — HYDROCODONE-ACETAMINOPHEN 5-325 MG PO TABS: 1.0000 | ORAL_TABLET | Freq: Four times a day (QID) | ORAL | Status: DC | PRN

## 2015-11-13 MED ORDER — HYDROCODONE-ACETAMINOPHEN 5-325 MG PO TABS
1.0000 | ORAL_TABLET | Freq: Four times a day (QID) | ORAL | Status: DC | PRN
  Administered 2015-11-13: 1 via ORAL

## 2015-11-13 MED ORDER — FENTANYL CITRATE (PF) 100 MCG/2ML IJ SOLN
INTRAMUSCULAR | Status: DC | PRN
  Administered 2015-11-13 (×2): 25 ug via INTRAVENOUS
  Administered 2015-11-13: 50 ug via INTRAVENOUS
  Administered 2015-11-13 (×2): 25 ug via INTRAVENOUS

## 2015-11-13 MED ORDER — BUPIVACAINE HCL (PF) 0.5 % IJ SOLN
INTRAMUSCULAR | Status: AC
  Filled 2015-11-13: qty 30

## 2015-11-13 MED ORDER — FENTANYL CITRATE (PF) 100 MCG/2ML IJ SOLN: 25.0000 ug | INTRAMUSCULAR | Status: DC | PRN

## 2015-11-13 MED ORDER — LIDOCAINE HCL (PF) 1 % IJ SOLN
INTRAMUSCULAR | Status: AC
  Filled 2015-11-13: qty 30

## 2015-11-13 MED ORDER — CLINDAMYCIN PHOSPHATE 900 MG/50ML IV SOLN
INTRAVENOUS | Status: AC
  Filled 2015-11-13: qty 50

## 2015-11-13 MED ORDER — LIDOCAINE HCL 1 % IJ SOLN
INTRAMUSCULAR | Status: DC | PRN
  Administered 2015-11-13: 20 mL

## 2015-11-13 MED ORDER — ONDANSETRON HCL 4 MG/2ML IJ SOLN: 4.0000 mg | Freq: Once | INTRAMUSCULAR | Status: DC | PRN

## 2015-11-13 MED ORDER — PROPOFOL 10 MG/ML IV BOLUS
INTRAVENOUS | Status: DC | PRN
  Administered 2015-11-13: 150 mg via INTRAVENOUS

## 2015-11-13 MED ORDER — FAMOTIDINE 20 MG PO TABS
ORAL_TABLET | ORAL | Status: AC
  Filled 2015-11-13: qty 1

## 2015-11-13 MED ORDER — HYDROCODONE-ACETAMINOPHEN 5-325 MG PO TABS
ORAL_TABLET | ORAL | Status: AC
  Filled 2015-11-13: qty 1

## 2015-11-13 MED ORDER — ONDANSETRON HCL 4 MG/2ML IJ SOLN
INTRAMUSCULAR | Status: DC | PRN
  Administered 2015-11-13: 4 mg via INTRAVENOUS

## 2015-11-13 MED ORDER — SODIUM CHLORIDE 0.9 % IV SOLN
INTRAVENOUS | Status: DC
  Administered 2015-11-13: 11:00:00 via INTRAVENOUS

## 2015-11-13 MED ORDER — CLINDAMYCIN PHOSPHATE 900 MG/50ML IV SOLN
900.0000 mg | Freq: Once | INTRAVENOUS | Status: AC
  Administered 2015-11-13: 900 mg via INTRAVENOUS

## 2015-11-13 MED ORDER — DOCUSATE SODIUM 100 MG PO CAPS: 100.0000 mg | ORAL_CAPSULE | Freq: Two times a day (BID) | ORAL | Status: DC

## 2015-11-13 MED ORDER — DEXAMETHASONE SODIUM PHOSPHATE 10 MG/ML IJ SOLN
INTRAMUSCULAR | Status: DC | PRN
  Administered 2015-11-13: 10 mg via INTRAVENOUS

## 2015-11-13 MED ORDER — EPHEDRINE SULFATE 50 MG/ML IJ SOLN
INTRAMUSCULAR | Status: DC | PRN
  Administered 2015-11-13: 5 mg via INTRAVENOUS
  Administered 2015-11-13: 10 mg via INTRAVENOUS

## 2015-11-13 MED ORDER — FAMOTIDINE 20 MG PO TABS
20.0000 mg | ORAL_TABLET | Freq: Once | ORAL | Status: AC
  Administered 2015-11-13: 20 mg via ORAL

## 2015-11-13 MED ORDER — PHENYLEPHRINE HCL 10 MG/ML IJ SOLN
INTRAMUSCULAR | Status: DC | PRN
  Administered 2015-11-13: 100 ug via INTRAVENOUS
  Administered 2015-11-13: 50 ug via INTRAVENOUS

## 2015-11-13 SURGICAL SUPPLY — 38 items
BLADE SURG 15 STRL LF DISP TIS (BLADE) ×2 IMPLANT
BLADE SURG 15 STRL SS (BLADE) ×1
CANISTER SUCT 1200ML W/VALVE (MISCELLANEOUS) ×3 IMPLANT
CHLORAPREP W/TINT 26ML (MISCELLANEOUS) ×3 IMPLANT
DRAIN PENROSE 1/4X12 LTX (DRAIN) ×3 IMPLANT
DRAPE LAPAROTOMY 77X122 PED (DRAPES) ×3 IMPLANT
ELECT CAUTERY NEEDLE TIP 1.0 (MISCELLANEOUS) ×3
ELECT REM PT RETURN 9FT ADLT (ELECTROSURGICAL) ×3
ELECTRODE CAUTERY NEDL TIP 1.0 (MISCELLANEOUS) ×2 IMPLANT
ELECTRODE REM PT RTRN 9FT ADLT (ELECTROSURGICAL) ×2 IMPLANT
GAUZE FLUFF 18X24 1PLY STRL (GAUZE/BANDAGES/DRESSINGS) IMPLANT
GAUZE PETROLATUM 1 X8 (GAUZE/BANDAGES/DRESSINGS) IMPLANT
GAUZE SPONGE 4X4 12PLY STRL (GAUZE/BANDAGES/DRESSINGS) ×3 IMPLANT
GAUZE STRETCH 2X75IN STRL (MISCELLANEOUS) IMPLANT
GLOVE BIO SURGEON STRL SZ 6.5 (GLOVE) ×3 IMPLANT
GLOVE BIO SURGEON STRL SZ7 (GLOVE) ×3 IMPLANT
GOWN STRL REUS W/ TWL LRG LVL3 (GOWN DISPOSABLE) ×4 IMPLANT
GOWN STRL REUS W/TWL LRG LVL3 (GOWN DISPOSABLE) ×2
KIT RM TURNOVER STRD PROC AR (KITS) ×3 IMPLANT
LABEL OR SOLS (LABEL) IMPLANT
LIQUID BAND (GAUZE/BANDAGES/DRESSINGS) ×3 IMPLANT
NEEDLE HYPO 25X1 1.5 SAFETY (NEEDLE) ×3 IMPLANT
NS IRRIG 500ML POUR BTL (IV SOLUTION) ×3 IMPLANT
PACK BASIN MINOR ARMC (MISCELLANEOUS) ×3 IMPLANT
PREP PVP WINGED SPONGE (MISCELLANEOUS) IMPLANT
SOL PREP PVP 2OZ (MISCELLANEOUS)
SOLUTION PREP PVP 2OZ (MISCELLANEOUS) IMPLANT
SPONGE XRAY 4X4 16PLY STRL (MISCELLANEOUS) ×3 IMPLANT
SUPPORETR ATHLETIC LG (MISCELLANEOUS) ×2 IMPLANT
SUPPORTER ATHLETIC LG (MISCELLANEOUS) ×3
SUT CHROMIC 3 0 SH 27 (SUTURE) ×6 IMPLANT
SUT ETHILON 3-0 FS-10 30 BLK (SUTURE)
SUT VIC AB 3-0 SH 27 (SUTURE) ×2
SUT VIC AB 3-0 SH 27X BRD (SUTURE) ×4 IMPLANT
SUT VIC AB 4-0 SH 27 (SUTURE) ×1
SUT VIC AB 4-0 SH 27XANBCTRL (SUTURE) ×2 IMPLANT
SUTURE EHLN 3-0 FS-10 30 BLK (SUTURE) IMPLANT
SYRINGE 10CC LL (SYRINGE) ×3 IMPLANT

## 2015-11-13 NOTE — Interval H&P Note (Signed)
History and Physical Interval Note:  11/13/2015 11:05 AM  Ruben Pandy Sr.  has presented today for surgery, with the diagnosis of PHIMOSIS,BILATERAL HYDROCELE  The various methods of treatment have been discussed with the patient and family. After consideration of risks, benefits and other options for treatment, the patient has consented to  Procedure(s): CIRCUMCISION ADULT (N/A) HYDROCELECTOMY ADULT (N/A) as a surgical intervention .  The patient's history has been reviewed, patient examined, no change in status, stable for surgery.  I have reviewed the patient's chart and labs.  Questions were answered to the patient's satisfaction.    RRR CTAB  Hollice Espy

## 2015-11-13 NOTE — Op Note (Signed)
Date of procedure: 11/13/2015  Preoperative diagnosis:  1. Phimosis 2. Bilateral hydrocele   Postoperative diagnosis:  1. same   Procedure: 1. Bilateral hydrocelectomy 2. Circumcision  Surgeon: Hollice Espy, MD  Anesthesia: General  Complications: None  Intraoperative findings: 300 cc left hydrocele, 75 cc right hydrocele, significant phimosis  EBL: minimal  Specimens: none  Drains: penrose drain in dependent scrotum  Indication: Ruben Rashed Sr. is a 82 y.o. patient with bilateral hydroceles, left greater than right and severe phimosis..  After reviewing the management options for treatment, he elected to proceed with the above surgical procedure(s). We have discussed the potential benefits and risks of the procedure, side effects of the proposed treatment, the likelihood of the patient achieving the goals of the procedure, and any potential problems that might occur during the procedure or recuperation. Informed consent has been obtained.  Description of procedure:  The patient was taken to the operating room and general anesthesia was induced.  The patient was placed in the supine position, prepped and draped in the usual sterile fashion, and preoperative antibiotics were administered. A preoperative time-out was performed.   At this point in time, the scrotum was carefully inspected. This revealed a large left-sided hydrocele along with a smaller right-sided hydrocele. On exam, there is no evidence of hernias bilaterally and the hydrocele sacs appeared to be noncommunicating. He did also have a severe phimosis and the glans was unable to be exposed.  Attention was first turned to the scrotum. One percent lidocaine was instilled along the median raphae for local anesthetic. Approximately 5 cm long incision was made in the midline along the raphae and the incision was carried down through the dartos layers towards the left hydrocele sac. The left testicle and hydrocele sac was  delivered into the field and the hydrocele layers were peeled off until only a thin, blue fluid containing sac was appreciated. A knife is used to incise the hydrocele sac at which time 300 cc of straw-colored fluid was evacuated. The hydrocele sac was opened superiorly and the edges of the sac were excised. A small portion of a long looping tortuous vas was intimately adjacent to the thickened area of the hydrocele sac and incidentally excised.  Hemostasis was achieved by oversewing the surface of the cut hydrocele sac with a 3-0 Vicryl the majority of the areas.  The remainder of hydrocele sac was cauterized using Bovie electrocautery. Care was taken to ensure that there was no injury to the testicular vasculature and the testicle itself appeared normal and well perfused throughout. Careful hemostasis was then achieved within the left hemiscrotum and bleeding areas of the dartos were fulgurated. Next, the septum was opened and the right testicle with hydrocele sac was then delivered through the incision. Again, blunt dissection was used to dissect out the hydrocele sac. It was then incised and 75 cc of straw-colored fluid was evacuated. Given the relatively small size of the sac, the sac was everted and the edges were sewn together using 3-0 Vicryl suture ensuring that the opening for the cord and cord structures was widely patent in order to avoid any vascular compromise. This testicle also appeared normal and well perfused. He was returned back into its normal anatomic position within the right hemiscrotum after adequate hemostasis was achieved. A small incision was created within the deep tendon scrotum and a Penrose drain was tunneled into the dependent portion of the scrotum. Finally, the doctors there was close using running 3-0 Vicryl suture. The skin  was closed using accommodation of Vicryl and chromic simple interrupted sutures.  Next, attention was turned to performing the circumcision.  At the  beginning of the case, a dorsal penile block along with a ring block was performed using 1% lidocaine. The foreskin was dilated bluntly using forceps in order to expose the glans. Additional Betadine solution was applied in a copious amount of smegma was cleaned from the coronal margin. 2 ring incisions were created one approximately 1 cm below the coronal margin and one at the mid shaft. The foreskin was then removed in a sleevelike fashion with care to avoid any injury to the underlying structures. Careful hemostasis was achieved with Bovie electrocautery. The skin edges were then brought together and closed using a series of simple interrupted 3-0 chromic sutures. The U stitch was used at the frenulum. Cosmesis was excellent. Both of the wounds were then cleaned and dried. Dermabond was applied to the scrotal incision. The circumcision was dressed using Vaseline solution, Coban, and cons form. Scrotal fluffs and a scrotal support device was applied. He was then reversed from anesthesia and taken to PACU in stable condition. There were no common locations of this case.   Pathology specimens including hydrocele sac and foreskin were not sent given concern for pathologic process.  Plan: Patient has a follow-up on Friday for drain removal. He will then be seen in the proximal one month for wound check.  Hollice Espy, M.D.

## 2015-11-13 NOTE — Discharge Instructions (Signed)
Circumcision Information and Post Care Instructions  Preparation: Pubic Hair There is no need to completely shave your pubic hair but it is desirable to trim it fairly short. Trim your pubic hair a few days in advance of the operation to allow time for the cut ends to soften again. Hygiene On the morning of the circumcision ensure that you take a good bath or shower and pay particular attention to your genitals. Retract your foreskin as far as you can and clean well under it. Immediately before the time of the procedure empty your bowels and bladder.   After-care: After the procedure your whole penis will be swollen and look very bruised. This is a normal effect of both the injected anaesthetic and the handling it necessarily receives during the operation. These will gradually reduce over the next week or two. Underwear If you normally wear boxers you may find that they give insufficient support immediately post procedure. You may wish to consider some form of briefs which will hold your penis in position to provide support and reduce the friction The Bandage The bandage will normally be wound tightly around the penis. Leave for 48hrs and keep clean and dry.    Promoting Healing Do not apply any antiseptic cream to your penis, nor add any antiseptic to bath water. Though they do help to kill germs, most are corrosive to new skin and actually slow down healing. In the rare cases where an infection develops, see a doctor as soon as possible. Smoking can delay healing and place you at higher risk of infection. You should quit or significantly reduce the amount of cigarettes you smoke prior to and after procedure.  Pain Killers Everyone reacts differently in respect of pain. For most people circumcision will not be truly painful, but a degree of discomfort is to be expected during the first few days. If you choose to take pain killing tablets like Tylenol then follow the instructions precisely.  Do not take more than the recommended maximum dose. Do not take Aspirin or any Aspirin based product since these thin the blood and have an anti-clotting action which can increase bleeding from a wound.  The Stitches Stitches need to remain in place long enough for the cut edges to knit together but not so long as to allow the skin around them to fully heal. In practice this usually means they should remain for between 1 and 2 weeks. Although the doctor will normally use soluble (or self-dissolving) stitches and will dissolve/ fall out on their own.    Time off School or Work There is no absolute need to take time off school or work after circumcision, but you may find it very hard to concentrate on work for the first few days and so may find it useful to take a week off. A week (or even two) off work is very desirable if you do heavy lifting or if your job keeps you seated and unable to move around freely for long periods. You should naturally avoid energetic or contact sports, sexual activity, cycling and swimming until your circumcision has fully healed.     Hydrocelectomy, Care After Refer to this sheet in the next few weeks. These instructions provide you with information about caring for yourself after your procedure. Your health care provider may also give you more specific instructions. Your treatment has been planned according to current medical practices, but problems sometimes occur. Call your health care provider if you have any problems or  questions after your procedure. WHAT TO EXPECT AFTER THE PROCEDURE After your procedure, it is common for the pouch that holds your testicles (scrotum) to be painful, swollen, and bruised. HOME CARE INSTRUCTIONS Bathing  Ask your health care provider when you can shower, take baths, or go swimming.  If you were told to wear an athletic support strap, take it off when you shower or take a bath. Incision Care  Follow instructions from your  health care provider about how to take care of your incision. Make sure you:  Wash your hands with soap and water before you change your bandage (dressing). If soap and water are not available, use hand sanitizer.  Change your dressing as told by your health care provider.  Leave stitches (sutures) in place.  Check your incision and scrotum every day for signs of infection. Check for:  More redness, swelling, or pain.  Blood or fluid.  Warmth.  Pus or a bad smell. Managing Pain, Stiffness, and Swelling  If directed, apply ice to the injured area:  Put ice in a plastic bag.  Place a towel between your skin and the bag.  Leave the ice on for 20 minutes, 2-3 times per day. Driving  Do not drive for 24 hours if you received a sedative.  Do not drive or operate heavy machinery while taking prescription pain medicine.  Ask your health care provider when it is safe to drive. Activity  Do not do any activities that require great strength and energy (are vigorous) for as long as told by your health care provider.  Return to your normal activities as told by your health care provider. Ask your health care provider what activities are safe for you.  Do not lift anything that is heavier than 10 lb (4.5 kg) until your health care provider says that it is safe. General Instructions  Take over-the-counter and prescription medicines only as told by your health care provider.  Keep all follow-up visits as told by your health care provider. This is important.  If you were given an athletic support strap, wear it as told by your health care provider.  If you had a drain put in during the procedure, you will need to return to have it removed. SEEK MEDICAL CARE IF:  Your pain gets worse.  You have more redness, swelling, or pain around your scrotum.  You have blood or fluid coming from your scrotum.  Your incision feels warm to the touch.  You have pus or a bad smell coming from  your scrotum.  You have a fever.   You may resume Aspirin in 7 days.    Hollice Espy, MD   AMBULATORY SURGERY  DISCHARGE INSTRUCTIONS   1) The drugs that you were given will stay in your system until tomorrow so for the next 24 hours you should not:  A) Drive an automobile B) Make any legal decisions C) Drink any alcoholic beverage   2) You may resume regular meals tomorrow.  Today it is better to start with liquids and gradually work up to solid foods.  You may eat anything you prefer, but it is better to start with liquids, then soup and crackers, and gradually work up to solid foods.   3) Please notify your doctor immediately if you have any unusual bleeding, trouble breathing, redness and pain at the surgery site, drainage, fever, or pain not relieved by medication.    4) Additional Instructions:  Additional Instructions: ° ° ° ° ° ° ° °Please contact your physician with any problems or Same Day Surgery at [phone], Monday through Friday 6 am to 4 pm, or [place] [facility] number at [phone]. ° ° ° ° °

## 2015-11-13 NOTE — Anesthesia Postprocedure Evaluation (Signed)
Anesthesia Post Note  Patient: Arul Barthol Sr.  Procedure(s) Performed: Procedure(s) (LRB): CIRCUMCISION ADULT (N/A) HYDROCELECTOMY ADULT (Bilateral)  Patient location during evaluation: PACU Anesthesia Type: General Level of consciousness: awake and alert Pain management: pain level controlled Vital Signs Assessment: post-procedure vital signs reviewed and stable Respiratory status: spontaneous breathing, nonlabored ventilation, respiratory function stable and patient connected to nasal cannula oxygen Cardiovascular status: blood pressure returned to baseline and stable Postop Assessment: no signs of nausea or vomiting Anesthetic complications: no    Last Vitals:  Filed Vitals:   11/13/15 1413 11/13/15 1500  BP: 126/54 128/54  Pulse: 73 74  Temp: 36.2 C   Resp: 14 14    Last Pain:  Filed Vitals:   11/13/15 1515  PainSc: 3                  Darionna Banke S

## 2015-11-13 NOTE — Transfer of Care (Signed)
Immediate Anesthesia Transfer of Care Note  Patient: Ruben Vink Sr.  Procedure(s) Performed: Procedure(s): CIRCUMCISION ADULT (N/A) HYDROCELECTOMY ADULT (Bilateral)  Patient Location: PACU  Anesthesia Type:General  Level of Consciousness: patient cooperative and lethargic  Airway & Oxygen Therapy: Patient Spontanous Breathing and Patient connected to face mask oxygen  Post-op Assessment: Report given to RN and Post -op Vital signs reviewed and stable  Post vital signs: Reviewed and stable  Last Vitals:  Filed Vitals:   11/13/15 1038 11/13/15 1325  BP: 116/57 108/43  Pulse: 64 83  Temp: 36.6 C 36.3 C  Resp: 16 17    Last Pain:  Filed Vitals:   11/13/15 1327  PainSc: 4          Complications: No apparent anesthesia complications

## 2015-11-13 NOTE — Anesthesia Preprocedure Evaluation (Addendum)
Anesthesia Evaluation  Patient identified by MRN, date of birth, ID band Patient awake    Reviewed: Allergy & Precautions, NPO status , Patient's Chart, lab work & pertinent test results, reviewed documented beta blocker date and time   Airway Mallampati: II  TM Distance: >3 FB     Dental  (+) Chipped, Missing   Pulmonary former smoker,           Cardiovascular hypertension, Pt. on medications + angina +CHF       Neuro/Psych CVA    GI/Hepatic   Endo/Other    Renal/GU Renal InsufficiencyRenal disease     Musculoskeletal   Abdominal   Peds  Hematology   Anesthesia Other Findings Denies chest pain or other cardiac symptoms. Active man. Did not have a CVA with weakness, but did have a TIA with eye problems. Has LBBB. Several missing teeth.  Reproductive/Obstetrics                           Anesthesia Physical Anesthesia Plan  ASA: III  Anesthesia Plan: General   Post-op Pain Management:    Induction: Intravenous  Airway Management Planned: LMA  Additional Equipment:   Intra-op Plan:   Post-operative Plan:   Informed Consent: I have reviewed the patients History and Physical, chart, labs and discussed the procedure including the risks, benefits and alternatives for the proposed anesthesia with the patient or authorized representative who has indicated his/her understanding and acceptance.     Plan Discussed with: CRNA  Anesthesia Plan Comments:         Anesthesia Quick Evaluation

## 2015-11-13 NOTE — OR Nursing (Signed)
300 ml of fluid from hydrocele drained.

## 2015-11-13 NOTE — Anesthesia Procedure Notes (Signed)
Procedure Name: LMA Insertion Date/Time: 11/13/2015 11:36 AM Performed by: Jonna Clark Pre-anesthesia Checklist: Patient identified, Patient being monitored, Timeout performed, Emergency Drugs available and Suction available Patient Re-evaluated:Patient Re-evaluated prior to inductionOxygen Delivery Method: Circle system utilized Preoxygenation: Pre-oxygenation with 100% oxygen Intubation Type: IV induction Ventilation: Mask ventilation without difficulty LMA: LMA inserted LMA Size: 3.5 Tube type: Oral Number of attempts: 1 Placement Confirmation: positive ETCO2 and breath sounds checked- equal and bilateral Tube secured with: Tape Dental Injury: Teeth and Oropharynx as per pre-operative assessment

## 2015-11-13 NOTE — H&P (View-Only) (Signed)
10/31/2015 9:49 AM   Ruben Hudson January 11, 1926 [id number]  Referring provider: No referring provider defined for this encounter.  Chief Complaint  Patient presents with  . Phimosis    New Patient    HPI:  1 - Lower Urinary Tract Sympotms -  Pt with well controlled modest obstructive LUTS on daily tamsulosin. Now minimal bother.   2 - Stage 3 Chronic Kidney Disease - Cr 1.5 / GFR 40s by PCP labs 2017. Prior renal imaging w/o hydro  3 - Phimosis - pt with increasing bother from moderate phimosis x few years. Interfearing with hygeine and ability to void from standing. Exam with approx 51mm open phimotic ring. He is bothored and wants therapy.  4 - Left > Right Hydroceles - about 500 mL left, 100 mL right estimated vol hydroceles present x years. Remote h/o left inguinal hernia repair. Prior US many years ago that he thinks confirmed hydrocle but not sure. Also significnat bother and has to wear compression underwear in order to do yard work. Also wante repair.  5 - Erectile Dysfunction  - years of progressive decline in ability to achieve and maintain erection. Unaided gets essentially zero erection even with intense stimulation. Libido excellent. No prior therapy. After discussion of options h e opts for observation.   PMH sig for CHF/Lasix (not limiting whatsoever). His PCP is Adrian Prows MD with [facility].   Today  " Ruben Hudson " is seen as new patient for above.    PMH: No past medical history on file.  Surgical History: No past surgical history on file.  Home Medications:    Medication List    Notice  As of 10/31/2015  9:49 AM   You have not been prescribed any medications.      Allergies: Allergies not on file  Family History: No family history on file.  Social History:  has no tobacco, alcohol, and drug history on file.     Review of Systems  Gastrointestinal (upper)  : Negative for upper GI symptoms  Gastrointestinal (lower) : Negative for lower GI  symptoms  Constitutional : Negative for symptoms  Skin: Negative for skin symptoms  Eyes: Negative for eye symptoms  Ear/Nose/Throat : Negative for Ear/Nose/Throat symptoms  Hematologic/Lymphatic: Negative for Hematologic/Lymphatic symptoms  Cardiovascular : Negative for cardiovascular symptoms  Respiratory : Negative for respiratory symptoms  Endocrine: Negative for endocrine symptoms  Musculoskeletal: Negative for musculoskeletal symptoms  Neurological: Negative for neurological symptoms  Psychologic: Negative for psychiatric symptoms    Physical Exam: There were no vitals taken for this visit.  Constitutional:  Alert and oriented, No acute distress. Very vigorous for age.  HEENT: [place] AT, moist mucus membranes.  Trachea midline, no masses. Cardiovascular: No clubbing, cyanosis, or edema. Respiratory: Normal respiratory effort, no increased work of breathing. GI: Abdomen is soft, nontender, nondistended, no abdominal masses GU: No CVA tenderness. Phiosis with 39mm ring. Good hygeine at present. Left > Right hydroceles. No palpable firm scortal masses or bowel contents.  Skin: No rashes, bruises or suspicious lesions. Lymph: No cervical or inguinal adenopathy. Neurologic: Grossly intact, no focal deficits, moving all 4 extremities. Psychiatric: Normal mood and affect.  Laboratory Data: Lab Results  Component Value Date   WBC 6.7 05/29/2012   HGB 13.7 05/29/2012   HCT 40.5 05/29/2012   MCV 92 05/29/2012   PLT 158 05/29/2012    Lab Results  Component Value Date   CREATININE 1.63* 05/30/2012    No results found for: PSA  No results  found for: TESTOSTERONE  No results found for: HGBA1C  Urinalysis    Component Value Date/Time   COLORURINE Straw 05/28/2012 1120   APPEARANCEUR Clear 05/28/2012 1120   LABSPEC 1.008 05/28/2012 1120   PHURINE 5.0 05/28/2012 1120   GLUCOSEU Negative 05/28/2012 1120   HGBUR 1+ 05/28/2012 1120   BILIRUBINUR Negative  05/28/2012 1120   KETONESUR Negative 05/28/2012 1120   PROTEINUR Negative 05/28/2012 1120   NITRITE Negative 05/28/2012 1120   LEUKOCYTESUR Negative 05/28/2012 1120      Assessment & Plan:    1 - Lower Urinary Tract Sympotms -  Well controlled on tamsulosin, continue.   2 - Stage 3 Chronic Kidney Disease - likely medical renal disease. Reinforced importance of BP control.  3 - Phimosis - discussed optoins of observation / steroid cream v. Dorsal slit v. circ and he wants circ. I agree given his level of bother. Risks, benefits, alternatives, peri-op course discussed in detail as well as possible chance in penile sensation. Will stop ASA piror as for primary prevention.   4 - Left > Right Hydroceles - discussed observation, cleroterapy, hydrocelectomy and he would like bilateral hydrocelectomy at time of circ above. i agree. Given size discussed he will likely have peri-op penrose drain to be removed in ofice 3-5 days post-op and he is agreeable. [place] prior to confirm etiology.   5 - Erectile Dysfunction  - severe. Discussed frankly that it would likely take injeciton meds (trimix) or surgery with prosthesis to allow coitus. He declines.   6 - RTC few days after circumcision / hydrocelectomy.       No Follow-up on file.  Alexis Frock, MD  [facility] [address] [place], [place] [address] [phone]    SCROTAL ULTRASOUND: Surface probe images obtained of scrotum. Confirms large left, small right hydrocele. NO hernias / bowel contents. NO large varicose veins.

## 2015-11-17 ENCOUNTER — Ambulatory Visit (INDEPENDENT_AMBULATORY_CARE_PROVIDER_SITE_OTHER): Payer: Medicare Other | Admitting: Urology

## 2015-11-17 ENCOUNTER — Encounter: Payer: Self-pay | Admitting: Urology

## 2015-11-17 VITALS — BP 98/62 | HR 83 | Ht 69.0 in | Wt 145.9 lb

## 2015-11-17 DIAGNOSIS — R351 Nocturia: Secondary | ICD-10-CM

## 2015-11-17 DIAGNOSIS — Z9889 Other specified postprocedural states: Secondary | ICD-10-CM

## 2015-11-17 LAB — URINALYSIS, COMPLETE
BILIRUBIN UA: NEGATIVE
GLUCOSE, UA: NEGATIVE
Nitrite, UA: NEGATIVE
PROTEIN UA: NEGATIVE
RBC, UA: NEGATIVE
SPEC GRAV UA: 1.015 (ref 1.005–1.030)
Urobilinogen, Ur: 0.2 mg/dL (ref 0.2–1.0)
pH, UA: 5.5 (ref 5.0–7.5)

## 2015-11-17 LAB — MICROSCOPIC EXAMINATION
Bacteria, UA: NONE SEEN
Epithelial Cells (non renal): NONE SEEN /hpf (ref 0–10)

## 2015-11-20 LAB — CULTURE, URINE COMPREHENSIVE

## 2015-11-21 ENCOUNTER — Telehealth: Payer: Self-pay

## 2015-11-21 ENCOUNTER — Ambulatory Visit: Payer: Self-pay

## 2015-11-21 DIAGNOSIS — N390 Urinary tract infection, site not specified: Secondary | ICD-10-CM

## 2015-11-21 NOTE — Telephone Encounter (Signed)
Spoke with pt in reference to ucx results. Pt will RTC today at 2 for cath specimen.

## 2015-11-21 NOTE — Telephone Encounter (Signed)
-----   Message from Nori Riis, PA-C sent at 11/20/2015  4:41 PM EDT ----- Patient will more than one organism.  He will need a CATH UA for culture.

## 2015-11-22 ENCOUNTER — Ambulatory Visit (INDEPENDENT_AMBULATORY_CARE_PROVIDER_SITE_OTHER): Payer: Medicare Other | Admitting: Urology

## 2015-11-22 VITALS — BP 102/50 | HR 68 | Ht 69.0 in | Wt 146.0 lb

## 2015-11-22 DIAGNOSIS — Z9889 Other specified postprocedural states: Secondary | ICD-10-CM | POA: Diagnosis not present

## 2015-11-22 DIAGNOSIS — N359 Urethral stricture, unspecified: Secondary | ICD-10-CM | POA: Diagnosis not present

## 2015-11-22 DIAGNOSIS — R351 Nocturia: Secondary | ICD-10-CM | POA: Diagnosis not present

## 2015-11-22 DIAGNOSIS — IMO0002 Reserved for concepts with insufficient information to code with codable children: Secondary | ICD-10-CM

## 2015-11-22 LAB — URINALYSIS, COMPLETE
BILIRUBIN UA: NEGATIVE
Glucose, UA: NEGATIVE
LEUKOCYTES UA: NEGATIVE
Nitrite, UA: NEGATIVE
PH UA: 5.5 (ref 5.0–7.5)
Protein, UA: NEGATIVE
RBC, UA: NEGATIVE
SPEC GRAV UA: 1.02 (ref 1.005–1.030)
UUROB: 0.2 mg/dL (ref 0.2–1.0)

## 2015-11-22 LAB — MICROSCOPIC EXAMINATION: BACTERIA UA: NONE SEEN

## 2015-11-23 NOTE — Progress Notes (Signed)
11/17/2015 11:36 AM   Ruben Pandy Sr. [birth year] [id number]  Referring provider: No referring provider defined for this encounter.  Chief Complaint  Patient presents with  . Routine Post Op    Circ    HPI: Patient is an 82 year old Caucasian male who is status post circumcision and bilateral hydrocelectomy performed on 11/13/2015 by Dr. Erlene Quan.  Background history Patient was found to have moderate phimosis with a proximate 6 mm opening the phimotic ring. He was having his urine stream spray in several directions and wanted therapy. He was also having bilateral scrotal swelling which was becoming bothersome and he wanted to undergo therapy for this condition as well.  Patient states his postoperative course was uneventful and as expected. He has not experienced purulent drainage or significant erythema.  He is having scrotal tenderness, but it is not unbearable.  He is not experiencing dysuria, gross hematuria or suprapubic pain.  He states he has noted an increase in nocturia since the surgery.  He has not noted significant drainage from his scrotal drain.  His UA today was unremarkable.  PMH: Past Medical History  Diagnosis Date  . Unstable angina pectoris ([place]) 02/26/2013  . BP (high blood pressure) 10/31/2015  . Acute pulmonary edema ([place]) 02/26/2013  . Hydrocele 02/25/2012  . Benign prostatic hyperplasia with urinary obstruction 02/24/2012  . Urinary urgency 10/31/2015  . Urge incontinence of urine 05/30/2014  . HLD (hyperlipidemia) 10/31/2015  . Congestive heart failure ([place]) 10/31/2015  . Cerebrovascular accident (CVA) ([place]) 04/24/2012  . Chronic kidney disease 02/16/2014    stage 2  . Cataract extraction status of left eye   . Cataract extraction status of right eye     Surgical History: Past Surgical History  Procedure Laterality Date  . Vasectomy    . Varicocelectomy  1948  . Tonsillectomy  1950's  . Umbilical hernia repair    . Ankle fracture surgery Right   .  Tonsillectomy    . Hernia repair    . Eye surgery    . Circumcision N/A 11/13/2015    Procedure: CIRCUMCISION ADULT;  Surgeon: Hollice Espy, MD;  Location: [facility] ORS;  Service: Urology;  Laterality: N/A;  . Hydrocele excision Bilateral 11/13/2015    Procedure: HYDROCELECTOMY ADULT;  Surgeon: Hollice Espy, MD;  Location: [facility] ORS;  Service: Urology;  Laterality: Bilateral;    Home Medications:    Medication List       This list is accurate as of: 11/17/15 11:59 PM.  Always use your most recent med list.               aspirin 325 MG tablet  Take 325 mg by mouth daily. Reported on 11/17/2015     atorvastatin 10 MG tablet  Commonly known as:  LIPITOR  Take 10 mg by mouth. In am.     CALCIUM 500/D 500-200 MG-UNIT tablet  Generic drug:  calcium-vitamin D  Take 1 tablet by mouth daily with breakfast.     docusate sodium 100 MG capsule  Commonly known as:  COLACE  Take 1 capsule (100 mg total) by mouth 2 (two) times daily.     furosemide 40 MG tablet  Commonly known as:  LASIX  Take 20 mg by mouth daily. In am.     HYDROcodone-acetaminophen 5-325 MG tablet  Commonly known as:  NORCO/VICODIN  Take 1-2 tablets by mouth every 6 (six) hours as needed for moderate pain.     lisinopril 10 MG tablet  Commonly known  as:  PRINIVIL,ZESTRIL  Take 10 mg by mouth daily. In am.     MULTI-VITAMINS Tabs  Take by mouth. In am.     tamsulosin 0.4 MG Caps capsule  Commonly known as:  FLOMAX  Take 0.4 mg by mouth. In am.        Allergies: No Known Allergies  Family History: Family History  Problem Relation Age of Onset  . Kidney cancer Neg Hx   . Prostate cancer Neg Hx     Social History:  reports that he quit smoking about 61 years ago. He does not have any smokeless tobacco history on file. He reports that he drinks about 4.8 oz of alcohol per week. He reports that he does not use illicit drugs.  ROS: UROLOGY Frequent Urination?: Yes Hard to postpone urination?:  No Burning/pain with urination?: No Get up at night to urinate?: Yes Leakage of urine?: No Urine stream starts and stops?: No Trouble starting stream?: No Do you have to strain to urinate?: No Blood in urine?: No Urinary tract infection?: No Sexually transmitted disease?: No Injury to kidneys or bladder?: No Painful intercourse?: No Weak stream?: No Erection problems?: No Penile pain?: No  Gastrointestinal Nausea?: No Vomiting?: No Indigestion/heartburn?: Yes Diarrhea?: No Constipation?: No  Constitutional Fever: No Night sweats?: No Weight loss?: Yes Fatigue?: No  Skin Skin rash/lesions?: No Itching?: No  Eyes Blurred vision?: No Double vision?: No  Ears/Nose/Throat Sore throat?: No Sinus problems?: No  Hematologic/Lymphatic Swollen glands?: No Easy bruising?: No  Cardiovascular Leg swelling?: No Chest pain?: No  Respiratory Cough?: Yes Shortness of breath?: No  Endocrine Excessive thirst?: No  Musculoskeletal Back pain?: No Joint pain?: No  Neurological Headaches?: No Dizziness?: No  Psychologic Depression?: No Anxiety?: No  Physical Exam: BP 98/62 mmHg  Pulse 83  Ht 5\' 9"  (1.753 m)  Wt 145 lb 14.4 oz (66.18 kg)  BMI 21.54 kg/m2  Constitutional: Well nourished. Alert and oriented, No acute distress. HEENT: [place] AT, moist mucus membranes. Trachea midline, no masses. Cardiovascular: No clubbing, cyanosis, or edema. Respiratory: Normal respiratory effort, no increased work of breathing. GI: Abdomen is soft, non tender, non distended, no abdominal masses. Liver and spleen not palpable.  No hernias appreciated.  Stool sample for occult testing is not indicated.   GU: No CVA tenderness.  No bladder fullness or masses.  Patient with circumcised phallus. Coronal sutures are clean and dry.  No penile swelling.  Urethral meatus is patent.  No penile discharge. No penile lesions or rashes. Scrotum with bruising, no lesions, cysts, rashes and/or  edema.  Sutures are clean and dry.  Drain is out of the scrotum and hanging by securing sutures.  Testicles are located scrotally bilaterally. No masses are appreciated in the testicles. Left and right epididymis are normal. Rectal: Deferred. Skin: No rashes, bruises or suspicious lesions. Lymph: No cervical or inguinal adenopathy. Neurologic: Grossly intact, no focal deficits, moving all 4 extremities. Psychiatric: Normal mood and affect.  Laboratory Data: Lab Results  Component Value Date   WBC 5.2 11/03/2015   HGB 14.3 11/13/2015   HCT 42.0 11/13/2015   MCV 95.0 11/03/2015   PLT 167 11/03/2015    Lab Results  Component Value Date   CREATININE 1.73* 11/03/2015       Component Value Date/Time   CHOL 128 05/29/2012 0421   HDL 54 05/29/2012 0421   VLDL 10 05/29/2012 0421   LDLCALC 64 05/29/2012 0421    Lab Results  Component Value Date  AST 27 05/28/2012   Lab Results  Component Value Date   ALT 30 05/28/2012    Urinalysis Results for orders placed or performed in visit on 11/17/15  CULTURE, URINE COMPREHENSIVE  Result Value Ref Range   Urine Culture, Comprehensive Final report    Result 1 Comment   Microscopic Examination  Result Value Ref Range   WBC, UA 0-5 0 -  5 /hpf   RBC, UA 0-2 0 -  2 /hpf   Epithelial Cells (non renal) None seen 0 - 10 /hpf   Mucus, UA Present (A) Not Estab.   Bacteria, UA None seen None seen/Few  Urinalysis, Complete  Result Value Ref Range   Specific Gravity, UA 1.015 1.005 - 1.030   pH, UA 5.5 5.0 - 7.5   Color, UA Yellow Yellow   Appearance Ur Clear Clear   Leukocytes, UA 1+ (A) Negative   Protein, UA Negative Negative/Trace   Glucose, UA Negative Negative   Ketones, UA Trace (A) Negative   RBC, UA Negative Negative   Bilirubin, UA Negative Negative   Urobilinogen, Ur 0.2 0.2 - 1.0 mg/dL   Nitrite, UA Negative Negative   Microscopic Examination See below:      Assessment & Plan:    1. Post-operative state:   Patient  is status post circumcision and bilateral hydrocelectomy.  His postoperative course has been uneventful and as expected.  Drain sutures are cut and drain and suture are removed.  2. Nocturia:   Patient has noticed an increase in nocturia since his operation.  His UA is unremarkable, but I will send the urine for culture to rule out possible infection as a cause of the new onset of nocturia.    - Urinalysis, Complete - CULTURE, URINE COMPREHENSIVE   Return in about 1 week (around 11/24/2015) for wound recheck.  These notes generated with voice recognition software. I apologize for typographical errors.  Zara Council, [facility] [address], [place], [place] [address] [phone]

## 2015-11-24 LAB — CULTURE, URINE COMPREHENSIVE

## 2015-11-25 ENCOUNTER — Encounter: Payer: Self-pay | Admitting: Urology

## 2015-11-25 NOTE — Progress Notes (Signed)
7:26 PM   Ruben Watry Sr. Aug 03, 1925 [id number]  Referring provider: Leonel Ramsay, MD [place], [place] [address]  Chief Complaint  Patient presents with  . Routine Post Op    HPI: Patient is an 82 year old Caucasian male who is status post circumcision and bilateral hydrocelectomy performed on 11/13/2015 by Dr. Erlene Quan.  Background history Patient was found to have moderate phimosis with a proximate 6 mm opening the phimotic ring. He was having his urine stream spray in several directions and wanted therapy. He was also having bilateral scrotal swelling which was becoming bothersome and he wanted to undergo therapy for this condition as well. Patient states his postoperative course was uneventful and as expected. He has not experienced purulent drainage or significant erythema.  He is having scrotal tenderness, but it is not unbearable.  He is not experiencing dysuria, gross hematuria or suprapubic pain.  He states he has noted an increase in nocturia since the surgery.  He has not noted significant drainage from his scrotal drain. His UA today was unremarkable.  His urine culture returned as multiple species identified.  Today, he is still experiencing urgency,  nocturia and a weak urinary stream.  He is still having to sit to urinate in order not to miss the toilet bowl.  He states his nocturia has improved. He is now only getting up once nightly.  His catheter UA specimen is unremarkable.    PMH: Past Medical History  Diagnosis Date  . Unstable angina pectoris ([place]) 02/26/2013  . BP (high blood pressure) 10/31/2015  . Acute pulmonary edema ([place]) 02/26/2013  . Hydrocele 02/25/2012  . Benign prostatic hyperplasia with urinary obstruction 02/24/2012  . Urinary urgency 10/31/2015  . Urge incontinence of urine 05/30/2014  . HLD (hyperlipidemia) 10/31/2015  . Congestive heart failure ([place]) 10/31/2015  . Cerebrovascular accident (CVA) ([place]) 04/24/2012  . Chronic kidney  disease 02/16/2014    stage 2  . Cataract extraction status of left eye   . Cataract extraction status of right eye     Surgical History: Past Surgical History  Procedure Laterality Date  . Vasectomy    . Varicocelectomy  1948  . Tonsillectomy  1950's  . Umbilical hernia repair    . Ankle fracture surgery Right   . Tonsillectomy    . Hernia repair    . Eye surgery    . Circumcision N/A 11/13/2015    Procedure: CIRCUMCISION ADULT;  Surgeon: Hollice Espy, MD;  Location: [facility] ORS;  Service: Urology;  Laterality: N/A;  . Hydrocele excision Bilateral 11/13/2015    Procedure: HYDROCELECTOMY ADULT;  Surgeon: Hollice Espy, MD;  Location: [facility] ORS;  Service: Urology;  Laterality: Bilateral;    Home Medications:    Medication List       This list is accurate as of: 11/22/15 11:59 PM.  Always use your most recent med list.               aspirin 325 MG tablet  Take 325 mg by mouth daily. Reported on 11/17/2015     atorvastatin 10 MG tablet  Commonly known as:  LIPITOR  Take 10 mg by mouth. In am.     CALCIUM 500/D 500-200 MG-UNIT tablet  Generic drug:  calcium-vitamin D  Take 1 tablet by mouth daily with breakfast.     docusate sodium 100 MG capsule  Commonly known as:  COLACE  Take 1 capsule (100 mg total) by mouth 2 (two) times daily.  furosemide 40 MG tablet  Commonly known as:  LASIX  Take 20 mg by mouth daily. In am.     lisinopril 10 MG tablet  Commonly known as:  PRINIVIL,ZESTRIL  Take 10 mg by mouth daily. In am.     MULTI-VITAMINS Tabs  Take by mouth. In am.     tamsulosin 0.4 MG Caps capsule  Commonly known as:  FLOMAX  Take 0.4 mg by mouth. In am.        Allergies: No Known Allergies  Family History: Family History  Problem Relation Age of Onset  . Kidney cancer Neg Hx   . Prostate cancer Neg Hx     Social History:  reports that he quit smoking about 61 years ago. He does not have any smokeless tobacco history on file. He reports that he  drinks about 4.8 oz of alcohol per week. He reports that he does not use illicit drugs.  ROS: UROLOGY Frequent Urination?: No Hard to postpone urination?: Yes Burning/pain with urination?: No Get up at night to urinate?: Yes Leakage of urine?: No Urine stream starts and stops?: No Trouble starting stream?: No Do you have to strain to urinate?: No Blood in urine?: No Urinary tract infection?: No Sexually transmitted disease?: No Injury to kidneys or bladder?: No Painful intercourse?: No Weak stream?: Yes Erection problems?: No Penile pain?: No  Gastrointestinal Nausea?: No Vomiting?: No Indigestion/heartburn?: Yes Diarrhea?: No Constipation?: No  Constitutional Fever: No Night sweats?: No Weight loss?: Yes Fatigue?: No  Skin Skin rash/lesions?: No Itching?: No  Eyes Blurred vision?: No Double vision?: No  Ears/Nose/Throat Sore throat?: No Sinus problems?: No  Hematologic/Lymphatic Swollen glands?: No Easy bruising?: Yes  Cardiovascular Leg swelling?: No Chest pain?: No  Respiratory Cough?: No Shortness of breath?: No  Endocrine Excessive thirst?: No  Musculoskeletal Back pain?: No Joint pain?: No  Neurological Headaches?: No Dizziness?: No  Psychologic Depression?: No Anxiety?: No  Physical Exam: BP 102/50 mmHg  Pulse 68  Ht 5\' 9"  (1.753 m)  Wt 146 lb (66.225 kg)  BMI 21.55 kg/m2  Constitutional: Well nourished. Alert and oriented, No acute distress. HEENT: [place] AT, moist mucus membranes. Trachea midline, no masses. Cardiovascular: No clubbing, cyanosis, or edema. Respiratory: Normal respiratory effort, no increased work of breathing. GI: Abdomen is soft, non tender, non distended, no abdominal masses. Liver and spleen not palpable.  No hernias appreciated.  Stool sample for occult testing is not indicated.   GU: No CVA tenderness.  No bladder fullness or masses.  Patient with circumcised phallus. Coronal sutures are clean and dry.   No penile swelling.  Urethral meatus is patent.  No penile discharge. No penile lesions or rashes. Scrotum with bruising, no lesions, cysts, rashes and/or edema.  Sutures are clean and dry.  Testicles are located scrotally bilaterally. No masses are appreciated in the testicles. Left and right epididymis are normal. Rectal: Deferred. Skin: No rashes, bruises or suspicious lesions. Lymph: No cervical or inguinal adenopathy. Neurologic: Grossly intact, no focal deficits, moving all 4 extremities. Psychiatric: Normal mood and affect.  Laboratory Data: Lab Results  Component Value Date   WBC 5.2 11/03/2015   HGB 14.3 11/13/2015   HCT 42.0 11/13/2015   MCV 95.0 11/03/2015   PLT 167 11/03/2015    Lab Results  Component Value Date   CREATININE 1.73* 11/03/2015       Component Value Date/Time   CHOL 128 05/29/2012 0421   HDL 54 05/29/2012 0421   VLDL 10 05/29/2012 0421  [place] 64 05/29/2012 0421    Lab Results  Component Value Date   AST 27 05/28/2012   Lab Results  Component Value Date   ALT 30 05/28/2012    Urinalysis Results for orders placed or performed in visit on 11/22/15  CULTURE, URINE COMPREHENSIVE  Result Value Ref Range   Urine Culture, Comprehensive Final report    Result 1 Comment   Microscopic Examination  Result Value Ref Range   WBC, UA 0-5 0 -  5 /hpf   RBC, UA 0-2 0 -  2 /hpf   Epithelial Cells (non renal) 0-10 0 - 10 /hpf   Bacteria, UA None seen None seen/Few  Urinalysis, Complete  Result Value Ref Range   Specific Gravity, UA 1.020 1.005 - 1.030   pH, UA 5.5 5.0 - 7.5   Color, UA Yellow Yellow   Appearance Ur Clear Clear   Leukocytes, UA Negative Negative   Protein, UA Negative Negative/Trace   Glucose, UA Negative Negative   Ketones, UA 1+ (A) Negative   RBC, UA Negative Negative   Bilirubin, UA Negative Negative   Urobilinogen, Ur 0.2 0.2 - 1.0 mg/dL   Nitrite, UA Negative Negative   Microscopic Examination See below:    In and Out  Catheterization  Patient is present today for a I & O catheterization due to a repeat culture. Patient was cleaned and prepped in a sterile fashion with betadine and Lidocaine 2% jelly was instilled into the urethra.  A 14 FR cath was inserted no complications were noted , 50 ml of urine return was noted, urine was yellow in color. A clean urine sample was collected for culture. Bladder was drained  and catheter was removed with out difficulty.    Preformed by: Zara Council, PA-C  Assessment & Plan:    1. Post-operative state:   Patient is status post circumcision and bilateral hydrocelectomy.  His postoperative course has been uneventful and as expected.    2. Nocturia:   Patient has noticed an increase in nocturia since his operation.  His UA was unremarkable.  The urine culture him and straight multiple species. He is catheted today for a specimen.      - Urinalysis, Complete - CULTURE, URINE COMPREHENSIVE  3. Meatal stenosis:   Patient's meatus was starting to fuse together. I was able to separate the membranes using the straight catheter while is obtaining the UA. I have instructed the patient to pull apart the meatus daily to prevent it from fusing.     Return in about 1 week (around 11/29/2015) for wound recheck.  These notes generated with voice recognition software. I apologize for typographical errors.  Zara Council, [facility] [address] [place], [place] [address] [phone]

## 2015-11-27 ENCOUNTER — Telehealth: Payer: Self-pay

## 2015-11-27 NOTE — Telephone Encounter (Signed)
-----   Message from Nori Riis, PA-C sent at 11/25/2015  7:27 PM EDT ----- Result the patient that his urine culture is negative. I will see him on the fifth.

## 2015-11-27 NOTE — Telephone Encounter (Signed)
Spoke with pt in reference to -ucx. Pt voiced understanding.  

## 2015-11-29 ENCOUNTER — Ambulatory Visit (INDEPENDENT_AMBULATORY_CARE_PROVIDER_SITE_OTHER): Payer: Medicare Other | Admitting: Urology

## 2015-11-29 ENCOUNTER — Encounter: Payer: Self-pay | Admitting: Urology

## 2015-11-29 VITALS — BP 90/49 | HR 73 | Ht 69.0 in | Wt 142.9 lb

## 2015-11-29 DIAGNOSIS — R351 Nocturia: Secondary | ICD-10-CM

## 2015-11-29 DIAGNOSIS — N359 Urethral stricture, unspecified: Secondary | ICD-10-CM | POA: Diagnosis not present

## 2015-11-29 DIAGNOSIS — Z9889 Other specified postprocedural states: Secondary | ICD-10-CM | POA: Diagnosis not present

## 2015-11-29 DIAGNOSIS — IMO0002 Reserved for concepts with insufficient information to code with codable children: Secondary | ICD-10-CM

## 2015-11-29 MED ORDER — BETAMETHASONE DIPROPIONATE 0.05 % EX CREA: TOPICAL_CREAM | Freq: Two times a day (BID) | CUTANEOUS | Status: DC

## 2015-11-29 NOTE — Progress Notes (Signed)
11:24 AM   Ruben Pandy Sr. [birth year] [id number]  Referring provider: Leonel Ramsay, MD [place], [place] [address]  Chief Complaint  Patient presents with  . Wound Check    HPI: Patient is an 82 year old WM who is status post circumcision and bilateral hydrocelectomy performed on 11/13/2015 by Dr. Erlene Quan.  Background history Patient was found to have moderate phimosis with a proximate 6 mm opening the phimotic ring. He was having his urine stream spray in several directions and wanted therapy. He was also having bilateral scrotal swelling which was becoming bothersome and he wanted to undergo therapy for this condition as well. Patient states his postoperative course was uneventful and as expected. He has not experienced purulent drainage or significant erythema.  He is having scrotal tenderness, but it is not unbearable.  He is not experiencing dysuria, gross hematuria or suprapubic pain.  He states he has noted an increase in nocturia since the surgery.  He has not noted significant drainage from his scrotal drain. His UA today was unremarkable.  His urine culture returned as multiple species identified.  Today, he is still experiencing a weak urinary stream.  He is still having to sit to urinate in order not to miss the toilet bowl.  He states his nocturia has improved. He is now only getting up once nightly.  He has been prying the meatus aprt.    He has had scant drainage from the scrotum over the last week.  He is not having scrotal redness, pain or purulent drainage.  He has not had fevers, chills, nausea or vomiting.    PMH: Past Medical History  Diagnosis Date  . Unstable angina pectoris ([place]) 02/26/2013  . BP (high blood pressure) 10/31/2015  . Acute pulmonary edema ([place]) 02/26/2013  . Hydrocele 02/25/2012  . Benign prostatic hyperplasia with urinary obstruction 02/24/2012  . Urinary urgency 10/31/2015  . Urge incontinence of urine 05/30/2014  . HLD  (hyperlipidemia) 10/31/2015  . Congestive heart failure ([place]) 10/31/2015  . Cerebrovascular accident (CVA) ([place]) 04/24/2012  . Chronic kidney disease 02/16/2014    stage 2  . Cataract extraction status of left eye   . Cataract extraction status of right eye     Surgical History: Past Surgical History  Procedure Laterality Date  . Vasectomy    . Varicocelectomy  1948  . Tonsillectomy  1950's  . Umbilical hernia repair    . Ankle fracture surgery Right   . Tonsillectomy    . Hernia repair    . Eye surgery    . Circumcision N/A 11/13/2015    Procedure: CIRCUMCISION ADULT;  Surgeon: Hollice Espy, MD;  Location: [facility] ORS;  Service: Urology;  Laterality: N/A;  . Hydrocele excision Bilateral 11/13/2015    Procedure: HYDROCELECTOMY ADULT;  Surgeon: Hollice Espy, MD;  Location: [facility] ORS;  Service: Urology;  Laterality: Bilateral;    Home Medications:    Medication List       This list is accurate as of: 11/29/15 11:24 AM.  Always use your most recent med list.               aspirin 325 MG tablet  Take 325 mg by mouth daily. Reported on 11/17/2015     atorvastatin 10 MG tablet  Commonly known as:  LIPITOR  Take 10 mg by mouth. In am.     betamethasone dipropionate 0.05 % cream  Commonly known as:  DIPROLENE  Apply topically 2 (two) times daily.  CALCIUM 500/D 500-200 MG-UNIT tablet  Generic drug:  calcium-vitamin D  Take 1 tablet by mouth daily with breakfast.     docusate sodium 100 MG capsule  Commonly known as:  COLACE  Take 1 capsule (100 mg total) by mouth 2 (two) times daily.     furosemide 40 MG tablet  Commonly known as:  LASIX  Take 20 mg by mouth daily. In am.     lisinopril 10 MG tablet  Commonly known as:  PRINIVIL,ZESTRIL  Take 10 mg by mouth daily. In am.     MULTI-VITAMINS Tabs  Take by mouth. In am.     tamsulosin 0.4 MG Caps capsule  Commonly known as:  FLOMAX  Take 0.4 mg by mouth. In am.        Allergies: No Known Allergies  Family  History: Family History  Problem Relation Age of Onset  . Kidney cancer Neg Hx   . Prostate cancer Neg Hx     Social History:  reports that he quit smoking about 61 years ago. He does not have any smokeless tobacco history on file. He reports that he drinks about 4.8 oz of alcohol per week. He reports that he does not use illicit drugs.  ROS: UROLOGY Frequent Urination?: No Hard to postpone urination?: Yes Burning/pain with urination?: No Get up at night to urinate?: No Leakage of urine?: No Urine stream starts and stops?: No Trouble starting stream?: No Do you have to strain to urinate?: No Blood in urine?: No Urinary tract infection?: No Sexually transmitted disease?: No Injury to kidneys or bladder?: No Painful intercourse?: No Weak stream?: No Erection problems?: No Penile pain?: No  Gastrointestinal Nausea?: No Vomiting?: No Indigestion/heartburn?: Yes Diarrhea?: No Constipation?: No  Constitutional Fever: No Night sweats?: No Weight loss?: Yes Fatigue?: No  Skin Skin rash/lesions?: No Itching?: No  Eyes Blurred vision?: No Double vision?: No  Ears/Nose/Throat Sore throat?: No Sinus problems?: No  Hematologic/Lymphatic Swollen glands?: No Easy bruising?: No  Cardiovascular Leg swelling?: No Chest pain?: No  Respiratory Cough?: No Shortness of breath?: No  Endocrine Excessive thirst?: No  Musculoskeletal Back pain?: No Joint pain?: Yes  Neurological Headaches?: No Dizziness?: Yes  Psychologic Depression?: No Anxiety?: No  Physical Exam: BP 90/49 mmHg  Pulse 73  Ht 5\' 9"  (1.753 m)  Wt 142 lb 14.4 oz (64.819 kg)  BMI 21.09 kg/m2  Constitutional: Well nourished. Alert and oriented, No acute distress. HEENT: [place] AT, moist mucus membranes. Trachea midline, no masses. Cardiovascular: No clubbing, cyanosis, or edema. Respiratory: Normal respiratory effort, no increased work of breathing. GI: Abdomen is soft, non tender, non  distended, no abdominal masses. Liver and spleen not palpable.  No hernias appreciated.  Stool sample for occult testing is not indicated.   GU: No CVA tenderness.  No bladder fullness or masses.  Patient with circumcised phallus. Coronal sutures are clean and dry.  No penile swelling.  Urethral meatus is patent.  No penile discharge. No penile lesions or rashes. Scrotum with bruising, no lesions, cysts, rashes and/or edema.  Sutures are clean and dry.  A 5 mm opening is noted in the scrotal suture.  Wound is clean and dry.  Testicles are located scrotally bilaterally. No masses are appreciated in the testicles. Left and right epididymis are normal. Rectal: Deferred. Skin: No rashes, bruises or suspicious lesions. Lymph: No cervical or inguinal adenopathy. Neurologic: Grossly intact, no focal deficits, moving all 4 extremities. Psychiatric: Normal mood and affect.  Laboratory Data: Lab  Results  Component Value Date   WBC 5.2 11/03/2015   HGB 14.3 11/13/2015   HCT 42.0 11/13/2015   MCV 95.0 11/03/2015   PLT 167 11/03/2015    Lab Results  Component Value Date   CREATININE 1.73* 11/03/2015       Component Value Date/Time   CHOL 128 05/29/2012 0421   HDL 54 05/29/2012 0421   VLDL 10 05/29/2012 0421   LDLCALC 64 05/29/2012 0421    Lab Results  Component Value Date   AST 27 05/28/2012   Lab Results  Component Value Date   ALT 30 05/28/2012     Assessment & Plan:    1. Post-operative state:   Patient is status post circumcision and bilateral hydrocelectomy.  His postoperative course has been uneventful and as expected.  He will RTC in one month for recheck.    2. Nocturia:   Resolved.    3. Meatal stenosis:   Patient's meatus was starting to fuse together.  I have instructed the patient to pull apart the meatus daily to prevent it from fusing.  I have prescribed Diprolene cream to apply to the head of the penis twice daily.     Return in about 1 month (around 12/30/2015) for  wound recheck.  These notes generated with voice recognition software. I apologize for typographical errors.  Zara Council, [facility] [address] [place], [place] [address] [phone]

## 2015-12-29 ENCOUNTER — Ambulatory Visit (INDEPENDENT_AMBULATORY_CARE_PROVIDER_SITE_OTHER): Payer: Medicare Other | Admitting: Urology

## 2015-12-29 ENCOUNTER — Encounter: Payer: Self-pay | Admitting: Urology

## 2015-12-29 VITALS — BP 119/57 | HR 60 | Ht 69.0 in | Wt 146.7 lb

## 2015-12-29 DIAGNOSIS — Z9889 Other specified postprocedural states: Secondary | ICD-10-CM

## 2015-12-29 MED ORDER — CLINDAMYCIN HCL 300 MG PO CAPS: 300.0000 mg | ORAL_CAPSULE | Freq: Three times a day (TID) | ORAL | 0 refills | Status: DC

## 2015-12-29 NOTE — Progress Notes (Signed)
12:22 PM   Ruben Bido Sr. 1925/12/20 [id number]  Referring provider: Leonel Ramsay, MD [place],  [id number]  Chief Complaint  Patient presents with  . Wound Check    1 month follow up    HPI: Patient is an 82 year old WM who is status post circumcision and bilateral hydrocelectomy performed on 11/13/2015 by Dr. Erlene Quan.  Background history Patient was found to have moderate phimosis with a proximate 6 mm opening the phimotic ring. He was having his urine stream spray in several directions and wanted therapy. He was also having bilateral scrotal swelling which was becoming bothersome and he wanted to undergo therapy for this condition as well. Patient states his postoperative course was uneventful and as expected. He has not experienced purulent drainage or significant erythema.  He is having scrotal tenderness, but it is not unbearable.  He is not experiencing dysuria, gross hematuria or suprapubic pain.  He states he has noted an increase in nocturia since the surgery.  He has not noted significant drainage from his scrotal drain. His UA today was unremarkable.  His urine culture returned as multiple species identified.  Today, he has had more drainage from the scrotum over the last week.  He has been working outside [employer].  He is not having scrotal redness, pain or purulent drainage.  He has not had fevers, chills, nausea or vomiting.    PMH: Past Medical History:  Diagnosis Date  . Acute pulmonary edema ([place]) 02/26/2013  . Benign prostatic hyperplasia with urinary obstruction 02/24/2012  . BP (high blood pressure) 10/31/2015  . Cataract extraction status of left eye   . Cataract extraction status of right eye   . Cerebrovascular accident (CVA) ([place]) 04/24/2012  . Chronic kidney disease 02/16/2014   stage 2  . Congestive heart failure ([place]) 10/31/2015  . HLD (hyperlipidemia) 10/31/2015  . Hydrocele 02/25/2012  . Unstable angina  pectoris ([place]) 02/26/2013  . Urge incontinence of urine 05/30/2014  . Urinary urgency 10/31/2015    Surgical History: Past Surgical History:  Procedure Laterality Date  . ANKLE FRACTURE SURGERY Right   . CIRCUMCISION N/A 11/13/2015   Procedure: CIRCUMCISION ADULT;  Surgeon: Hollice Espy, MD;  Location: [facility] ORS;  Service: Urology;  Laterality: N/A;  . EYE SURGERY    . HERNIA REPAIR    . HYDROCELE EXCISION Bilateral 11/13/2015   Procedure: HYDROCELECTOMY ADULT;  Surgeon: Hollice Espy, MD;  Location: [facility] ORS;  Service: Urology;  Laterality: Bilateral;  . TONSILLECTOMY  1950's  . TONSILLECTOMY    . UMBILICAL HERNIA REPAIR    . VARICOCELECTOMY  1948  . VASECTOMY      Home Medications:    Medication List       Accurate as of 12/29/15 12:22 PM. Always use your most recent med list.          aspirin EC 81 MG tablet Take 81 mg by mouth.   atorvastatin 10 MG tablet Commonly known as:  LIPITOR Take 10 mg by mouth. In am.   betamethasone dipropionate 0.05 % cream Commonly known as:  DIPROLENE Apply topically 2 (two) times daily.   CALCIUM 500/D 500-200 MG-UNIT tablet Generic drug:  calcium-vitamin D Take 1 tablet by mouth daily with breakfast.   Calcium-Vitamin D 500-125 MG-UNIT Tabs Take by mouth.   clindamycin 300 MG capsule Commonly known as:  CLEOCIN Take 1 capsule (300 mg total) by mouth 3 (three) times daily.   docusate sodium  100 MG capsule Commonly known as:  COLACE Take 1 capsule (100 mg total) by mouth 2 (two) times daily.   furosemide 40 MG tablet Commonly known as:  LASIX Take 20 mg by mouth daily. In am.   lisinopril 10 MG tablet Commonly known as:  PRINIVIL,ZESTRIL Take 10 mg by mouth daily. In am.   MULTI-VITAMINS Tabs Take by mouth. In am.   tamsulosin 0.4 MG Caps capsule Commonly known as:  FLOMAX Take 0.4 mg by mouth. In am.   Trospium Chloride 60 MG Cp24 Take 60 mg by mouth.       Allergies: No Known Allergies  Family  History: Family History  Problem Relation Age of Onset  . Kidney cancer Neg Hx   . Prostate cancer Neg Hx     Social History:  reports that he quit smoking about 61 years ago. He does not have any smokeless tobacco history on file. He reports that he drinks about 4.8 oz of alcohol per week . He reports that he does not use drugs.  ROS: UROLOGY Frequent Urination?: No Hard to postpone urination?: No Burning/pain with urination?: No Get up at night to urinate?: No Leakage of urine?: No Urine stream starts and stops?: No Trouble starting stream?: No Do you have to strain to urinate?: No Blood in urine?: No Urinary tract infection?: No Sexually transmitted disease?: No Injury to kidneys or bladder?: No Painful intercourse?: No Weak stream?: No Erection problems?: Yes Penile pain?: No  Gastrointestinal Nausea?: No Vomiting?: No Indigestion/heartburn?: Yes Diarrhea?: No Constipation?: No  Constitutional Fever: No Night sweats?: No Weight loss?: No Fatigue?: No  Skin Skin rash/lesions?: No Itching?: No  Eyes Blurred vision?: No Double vision?: No  Ears/Nose/Throat Sore throat?: No Sinus problems?: No  Hematologic/Lymphatic Swollen glands?: No Easy bruising?: No  Cardiovascular Leg swelling?: No Chest pain?: No  Respiratory Cough?: No Shortness of breath?: No  Endocrine Excessive thirst?: No  Musculoskeletal Back pain?: No Joint pain?: Yes  Neurological Headaches?: No Dizziness?: No  Psychologic Depression?: No Anxiety?: No  Physical Exam: BP (!) 119/57   Pulse 60   Ht 5\' 9"  (1.753 m)   Wt 146 lb 11.2 oz (66.5 kg)   BMI 21.66 kg/m   Constitutional: Well nourished. Alert and oriented, No acute distress. HEENT: [place] AT, moist mucus membranes. Trachea midline, no masses. Cardiovascular: No clubbing, cyanosis, or edema. Respiratory: Normal respiratory effort, no increased work of breathing. GI: Abdomen is soft, non tender, non distended,  no abdominal masses. Liver and spleen not palpable.  No hernias appreciated.  Stool sample for occult testing is not indicated.   GU: No CVA tenderness.  No bladder fullness or masses.  Patient with circumcised phallus. Coronal sutures absorbed.  No penile swelling.  Urethral meatus is patent.  No penile discharge. No penile lesions or rashes. Scrotum with bruising, no lesions, cysts, rashes and/or edema.  Right scrotum is enlarged, but it is non tender.  A 5 mm opening is noted in the scrotal suture.  A probe is inserted into the opening and dark blood is expelled.  There is a hematoma located in the right scrotum, 20 mm x 20 mm.  Testicles are located scrotally bilaterally. No masses are appreciated in the testicles. Left and right epididymis are normal. Rectal: Deferred. Skin: No rashes, bruises or suspicious lesions. Lymph: No cervical or inguinal adenopathy. Neurologic: Grossly intact, no focal deficits, moving all 4 extremities. Psychiatric: Normal mood and affect.  Laboratory Data: Lab Results  Component Value  Date   WBC 5.2 11/03/2015   HGB 14.3 11/13/2015   HCT 42.0 11/13/2015   MCV 95.0 11/03/2015   PLT 167 11/03/2015    Lab Results  Component Value Date   CREATININE 1.73 (H) 11/03/2015       Component Value Date/Time   CHOL 128 05/29/2012 0421   HDL 54 05/29/2012 0421   VLDL 10 05/29/2012 0421   LDLCALC 64 05/29/2012 0421    Lab Results  Component Value Date   AST 27 05/28/2012   Lab Results  Component Value Date   ALT 30 05/28/2012     Assessment & Plan:    1. Post-operative state:   Patient is status post circumcision and bilateral hydrocelectomy.  A hematoma has developed.  He is instructed to keep the area open and covered in [place].  He is not to take baths or soak in the tub.  A prescription for Clindamycin is sent to his pharmacy.  He will report any purulent drainage or fevers.  He will return in 2 weeks for a wound recheck.    2. Meatal stenosis:   Not  addressed at this visit.    Return in about 2 weeks (around 01/12/2016) for wound recheck.  These notes generated with voice recognition software. I apologize for typographical errors.  Zara Council, [facility] [address], [place], [place] [address] [phone]

## 2016-01-15 ENCOUNTER — Encounter: Payer: Self-pay | Admitting: Urology

## 2016-01-15 ENCOUNTER — Ambulatory Visit (INDEPENDENT_AMBULATORY_CARE_PROVIDER_SITE_OTHER): Payer: Medicare Other | Admitting: Urology

## 2016-01-15 VITALS — BP 114/63 | HR 66 | Ht 69.0 in | Wt 147.2 lb

## 2016-01-15 DIAGNOSIS — Z9889 Other specified postprocedural states: Secondary | ICD-10-CM

## 2016-01-15 DIAGNOSIS — N528 Other male erectile dysfunction: Secondary | ICD-10-CM

## 2016-01-15 DIAGNOSIS — N529 Male erectile dysfunction, unspecified: Secondary | ICD-10-CM

## 2016-01-15 NOTE — Progress Notes (Signed)
11:49 AM   Ruben Pandy Sr. 1926/03/09 [id number]  Referring provider: Leonel Ramsay, MD [place], [place] [address]  Chief Complaint  Patient presents with  . Routine Post Op    2 week follow up Circumscision    HPI: Patient is an 82 year old WM who is status post circumcision and bilateral hydrocelectomy performed on 11/13/2015 by Dr. Erlene Quan.  Background history Patient was found to have moderate phimosis with a proximate 6 mm opening the phimotic ring. He was having his urine stream spray in several directions and wanted therapy. He was also having bilateral scrotal swelling which was becoming bothersome and he wanted to undergo therapy for this condition as well. Patient states his postoperative course was uneventful and as expected. He has not experienced purulent drainage or significant erythema.  He is having scrotal tenderness, but it is not unbearable.  He is not experiencing dysuria, gross hematuria or suprapubic pain.  He states he has noted an increase in nocturia since the surgery.  He has not noted significant drainage from his scrotal drain. His UA today was unremarkable.  His urine culture returned as multiple species identified.  Today, he states that he has not had any more drainage from the scrotum since his last visit.  He has been following his restrictions.  He is not having scrotal redness, pain or purulent drainage.  He has not had fevers, chills, nausea or vomiting.    He is also complaining of erectile dysfunction. He states he has not been able to have an erection for several years and he would like to pursue treatment for this condition.  He believes he is tried Viagra in the past with some success.  He denied any curvature to erections or pain with erections in the past.  He is no longer having spontaneous erections at night.  PMH: Past Medical History:  Diagnosis Date  . Acute pulmonary  edema ([place]) 02/26/2013  . Benign prostatic hyperplasia with urinary obstruction 02/24/2012  . BP (high blood pressure) 10/31/2015  . Cataract extraction status of left eye   . Cataract extraction status of right eye   . Cerebrovascular accident (CVA) ([place]) 04/24/2012  . Chronic kidney disease 02/16/2014   stage 2  . Congestive heart failure ([place]) 10/31/2015  . HLD (hyperlipidemia) 10/31/2015  . Hydrocele 02/25/2012  . Unstable angina pectoris ([place]) 02/26/2013  . Urge incontinence of urine 05/30/2014  . Urinary urgency 10/31/2015    Surgical History: Past Surgical History:  Procedure Laterality Date  . ANKLE FRACTURE SURGERY Right   . CIRCUMCISION N/A 11/13/2015   Procedure: CIRCUMCISION ADULT;  Surgeon: Hollice Espy, MD;  Location: [facility] ORS;  Service: Urology;  Laterality: N/A;  . EYE SURGERY    . HERNIA REPAIR    . HYDROCELE EXCISION Bilateral 11/13/2015   Procedure: HYDROCELECTOMY ADULT;  Surgeon: Hollice Espy, MD;  Location: [facility] ORS;  Service: Urology;  Laterality: Bilateral;  . TONSILLECTOMY  1950's  . TONSILLECTOMY    . UMBILICAL HERNIA REPAIR    . VARICOCELECTOMY  1948  . VASECTOMY      Home Medications:    Medication List       Accurate as of 01/15/16 11:49 AM. Always use your most recent med list.          aspirin 325 MG tablet Take 325 mg by mouth daily.   aspirin EC 81 MG tablet Take 81 mg by mouth.   atorvastatin  10 MG tablet Commonly known as:  LIPITOR Take 10 mg by mouth. In am.   betamethasone dipropionate 0.05 % cream Commonly known as:  DIPROLENE Apply topically 2 (two) times daily.   CALCIUM 500/D 500-200 MG-UNIT tablet Generic drug:  calcium-vitamin D Take 1 tablet by mouth daily with breakfast.   clindamycin 300 MG capsule Commonly known as:  CLEOCIN Take 1 capsule (300 mg total) by mouth 3 (three) times daily.   docusate sodium 100 MG capsule Commonly known as:  COLACE Take 1 capsule (100 mg total) by mouth 2 (two) times daily.   furosemide  40 MG tablet Commonly known as:  LASIX Take 20 mg by mouth daily. In am.   lisinopril 10 MG tablet Commonly known as:  PRINIVIL,ZESTRIL Take 10 mg by mouth daily. In am.   MULTI-VITAMINS Tabs Take by mouth. In am.   tamsulosin 0.4 MG Caps capsule Commonly known as:  FLOMAX Take 0.4 mg by mouth. In am.   Trospium Chloride 60 MG Cp24 Take 60 mg by mouth.       Allergies: No Known Allergies  Family History: Family History  Problem Relation Age of Onset  . Kidney cancer Neg Hx   . Prostate cancer Neg Hx     Social History:  reports that he quit smoking about 61 years ago. He does not have any smokeless tobacco history on file. He reports that he drinks about 4.8 oz of alcohol per week . He reports that he does not use drugs.  ROS: UROLOGY Frequent Urination?: No Hard to postpone urination?: No Burning/pain with urination?: No Get up at night to urinate?: Yes Leakage of urine?: No Urine stream starts and stops?: No Trouble starting stream?: No Do you have to strain to urinate?: No Blood in urine?: No Urinary tract infection?: No Sexually transmitted disease?: No Injury to kidneys or bladder?: No Painful intercourse?: No Weak stream?: No Erection problems?: Yes Penile pain?: No  Gastrointestinal Nausea?: No Vomiting?: No Indigestion/heartburn?: No Diarrhea?: No Constipation?: No  Constitutional Fever: No Night sweats?: No Weight loss?: No Fatigue?: No  Skin Skin rash/lesions?: No Itching?: No  Eyes Blurred vision?: No Double vision?: No  Ears/Nose/Throat Sore throat?: No Sinus problems?: No  Hematologic/Lymphatic Swollen glands?: No Easy bruising?: Yes  Cardiovascular Leg swelling?: No Chest pain?: No  Respiratory Cough?: No Shortness of breath?: No  Endocrine Excessive thirst?: No  Musculoskeletal Back pain?: No Joint pain?: Yes  Neurological Headaches?: No Dizziness?: No  Psychologic Depression?: No Anxiety?:  No  Physical Exam: BP 114/63   Pulse 66   Ht 5\' 9"  (1.753 m)   Wt 147 lb 3.2 oz (66.8 kg)   BMI 21.74 kg/m   Constitutional: Well nourished. Alert and oriented, No acute distress. HEENT: [place] AT, moist mucus membranes. Trachea midline, no masses. Cardiovascular: No clubbing, cyanosis, or edema. Respiratory: Normal respiratory effort, no increased work of breathing. GI: Abdomen is soft, non tender, non distended, no abdominal masses. Liver and spleen not palpable.  No hernias appreciated.  Stool sample for occult testing is not indicated.   GU: No CVA tenderness.  No bladder fullness or masses.  Patient with circumcised phallus.   No penile swelling.  Urethral meatus is patent.  No penile discharge. No penile lesions or rashes. Scrotum with no bruising, no lesions, cysts, rashes and/or edema.  Right scrotum is enlarged, but it is non tender.  No opening is noted in the scrotal suture.  There is no hematoma is noted.  Testicles  are located scrotally bilaterally. No masses are appreciated in the testicles. Left and right epididymis are normal. Rectal: Deferred. Skin: No rashes, bruises or suspicious lesions. Lymph: No cervical or inguinal adenopathy. Neurologic: Grossly intact, no focal deficits, moving all 4 extremities. Psychiatric: Normal mood and affect.  Laboratory Data: Lab Results  Component Value Date   WBC 5.2 11/03/2015   HGB 14.3 11/13/2015   HCT 42.0 11/13/2015   MCV 95.0 11/03/2015   PLT 167 11/03/2015    Lab Results  Component Value Date   CREATININE 1.73 (H) 11/03/2015       Component Value Date/Time   CHOL 128 05/29/2012 0421   HDL 54 05/29/2012 0421   VLDL 10 05/29/2012 0421   LDLCALC 64 05/29/2012 0421    Lab Results  Component Value Date   AST 27 05/28/2012   Lab Results  Component Value Date   ALT 30 05/28/2012     Assessment & Plan:    1. Post-operative state:   Patient is status post circumcision and bilateral hydrocelectomy.  A hematoma has  resolved.   He will return in one month for a wound recheck.    2. Erectile dysfunction  - will give a retrial of Viagra, 100 mg samples are given  - RTC in one month for SHIM score and exam   Return in about 1 month (around 02/15/2016) for SHIM and exam.  These notes generated with voice recognition software. I apologize for typographical errors.  Zara Council, [facility] [address], [place], [place] [address] [phone]

## 2016-02-15 ENCOUNTER — Encounter: Payer: Self-pay | Admitting: Urology

## 2016-02-15 ENCOUNTER — Ambulatory Visit (INDEPENDENT_AMBULATORY_CARE_PROVIDER_SITE_OTHER): Payer: Medicare Other | Admitting: Urology

## 2016-02-15 VITALS — BP 109/55 | HR 66 | Ht 69.0 in | Wt 147.7 lb

## 2016-02-15 DIAGNOSIS — Z9889 Other specified postprocedural states: Secondary | ICD-10-CM | POA: Diagnosis not present

## 2016-02-15 DIAGNOSIS — N529 Male erectile dysfunction, unspecified: Secondary | ICD-10-CM

## 2016-02-15 DIAGNOSIS — N528 Other male erectile dysfunction: Secondary | ICD-10-CM

## 2016-02-15 MED ORDER — BETAMETHASONE DIPROPIONATE 0.05 % EX CREA: TOPICAL_CREAM | Freq: Two times a day (BID) | CUTANEOUS | 0 refills | Status: DC

## 2016-02-15 NOTE — Progress Notes (Signed)
2:45 PM   Ruben Lantigua Sr. 1926/05/15 [id number]  Referring provider: Leonel Ramsay, MD [place] [place], [place] [address]  Chief Complaint  Patient presents with  . Urinary Urgency    Follow up    HPI: Patient is an 82 year old WM who is status post circumcision and bilateral hydrocelectomy performed on 11/13/2015 by Dr. Erlene Quan who also complains of erectile dysfunction who presents today for a one month follow up.    Background history Patient was found to have moderate phimosis with a proximate 6 mm opening the phimotic ring. He was having his urine stream spray in several directions and wanted therapy. He was also having bilateral scrotal swelling which was becoming bothersome and he wanted to undergo therapy for this condition as well. Patient states his postoperative course was uneventful and as expected. He has not experienced purulent drainage or significant erythema.  He is having scrotal tenderness, but it is not unbearable.  He is not experiencing dysuria, gross hematuria or suprapubic pain.  He states he has noted an increase in nocturia since the surgery.  He has not noted significant drainage from his scrotal drain. His UA today was unremarkable.  His urine culture returned as multiple species identified.  He was found to have a hematoma about one month ago.  Today, he states that he has not had any more drainage from the scrotum since his last visit.  He has been following his restrictions.  He is not having scrotal redness, pain or purulent drainage.  He has not had fevers, chills, nausea or vomiting.    His SHIM score is 21, which is no ED.   Patient answered the questions on the SHIM regarding the last time he had sex and how that experience had been for him  He actually has been having difficulty with erections for several years.   His major complaint is no erections.   His libido is preserved.   His risk  factors for ED are age, BPH, stroke, HTN, HLD, CHF and blood pressure medications.   He denies any painful erections or curvatures with his erections.   He has tried Viagra in the past with success.  He was given Viagra samples one month ago, but he has not taken them.        SHIM    Row Name 02/15/16 1421         SHIM: Over the last 6 months:   How do you rate your confidence that you could get and keep an erection? Very Low     When you had erections with sexual stimulation, how often were your erections hard enough for penetration (entering your partner)? Almost Always or Always     During sexual intercourse, how often were you able to maintain your erection after you had penetrated (entered) your partner? Not Difficult     During sexual intercourse, how difficult was it to maintain your erection to completion of intercourse? Not Difficult     When you attempted sexual intercourse, how often was it satisfactory for you? Not Difficult       SHIM Total Score   SHIM 21        Score: 1-7 Severe ED 8-11 Moderate ED 12-16 Mild-Moderate ED 17-21 Mild ED 22-25 No ED  PMH: Past Medical History:  Diagnosis Date  . Acute pulmonary edema ([place]) 02/26/2013  . Benign prostatic hyperplasia with urinary obstruction 02/24/2012  . BP (  high blood pressure) 10/31/2015  . Cataract extraction status of left eye   . Cataract extraction status of right eye   . Cerebrovascular accident (CVA) ([place]) 04/24/2012  . Chronic kidney disease 02/16/2014   stage 2  . Congestive heart failure ([place]) 10/31/2015  . HLD (hyperlipidemia) 10/31/2015  . Hydrocele 02/25/2012  . Unstable angina pectoris ([place]) 02/26/2013  . Urge incontinence of urine 05/30/2014  . Urinary urgency 10/31/2015    Surgical History: Past Surgical History:  Procedure Laterality Date  . ANKLE FRACTURE SURGERY Right   . CIRCUMCISION N/A 11/13/2015   Procedure: CIRCUMCISION ADULT;  Surgeon: Hollice Espy, MD;  Location: [facility] ORS;  Service: Urology;   Laterality: N/A;  . EYE SURGERY    . HERNIA REPAIR    . HYDROCELE EXCISION Bilateral 11/13/2015   Procedure: HYDROCELECTOMY ADULT;  Surgeon: Hollice Espy, MD;  Location: [facility] ORS;  Service: Urology;  Laterality: Bilateral;  . TONSILLECTOMY  1950's  . TONSILLECTOMY    . UMBILICAL HERNIA REPAIR    . VARICOCELECTOMY  1948  . VASECTOMY      Home Medications:    Medication List       Accurate as of 02/15/16  2:45 PM. Always use your most recent med list.          aspirin 325 MG tablet Take 325 mg by mouth daily.   atorvastatin 10 MG tablet Commonly known as:  LIPITOR Take 10 mg by mouth. In am.   betamethasone dipropionate 0.05 % cream Commonly known as:  DIPROLENE Apply topically 2 (two) times daily.   betamethasone dipropionate 0.05 % cream Commonly known as:  DIPROLENE Apply topically 2 (two) times daily.   CALCIUM 500/D 500-200 MG-UNIT tablet Generic drug:  calcium-vitamin D Take 1 tablet by mouth daily with breakfast.   clindamycin 300 MG capsule Commonly known as:  CLEOCIN Take 1 capsule (300 mg total) by mouth 3 (three) times daily.   docusate sodium 100 MG capsule Commonly known as:  COLACE Take 1 capsule (100 mg total) by mouth 2 (two) times daily.   furosemide 40 MG tablet Commonly known as:  LASIX Take 20 mg by mouth daily. In am.   lisinopril 10 MG tablet Commonly known as:  PRINIVIL,ZESTRIL Take 10 mg by mouth daily. In am.   MULTI-VITAMINS Tabs Take by mouth. In am.   tamsulosin 0.4 MG Caps capsule Commonly known as:  FLOMAX Take 0.4 mg by mouth. In am.   Trospium Chloride 60 MG Cp24 Take 60 mg by mouth.       Allergies: No Known Allergies  Family History: Family History  Problem Relation Age of Onset  . Kidney cancer Neg Hx   . Prostate cancer Neg Hx     Social History:  reports that he quit smoking about 61 years ago. He has never used smokeless tobacco. He reports that he drinks about 4.8 oz of alcohol per week . He reports  that he does not use drugs.  ROS: UROLOGY Frequent Urination?: No Hard to postpone urination?: No Burning/pain with urination?: No Get up at night to urinate?: No Leakage of urine?: No Urine stream starts and stops?: No Trouble starting stream?: No Do you have to strain to urinate?: No Blood in urine?: No Urinary tract infection?: No Sexually transmitted disease?: No Injury to kidneys or bladder?: No Painful intercourse?: No Weak stream?: No Erection problems?: No Penile pain?: No  Gastrointestinal Nausea?: No Vomiting?: No Indigestion/heartburn?: Yes Diarrhea?: No Constipation?: No  Constitutional Fever: No  Night sweats?: No Weight loss?: No Fatigue?: No  Skin Skin rash/lesions?: No Itching?: No  Eyes Blurred vision?: No Double vision?: No  Ears/Nose/Throat Sore throat?: No Sinus problems?: No  Hematologic/Lymphatic Swollen glands?: No Easy bruising?: No  Cardiovascular Leg swelling?: No Chest pain?: No  Respiratory Cough?: Yes Shortness of breath?: No  Endocrine Excessive thirst?: No  Musculoskeletal Back pain?: No Joint pain?: No  Neurological Headaches?: No Dizziness?: No  Psychologic Depression?: No Anxiety?: No  Physical Exam: BP (!) 109/55 (BP Location: Left Arm, Patient Position: Sitting, Cuff Size: Normal)   Pulse 66   Ht 5\' 9"  (1.753 m)   Wt 147 lb 11.2 oz (67 kg)   BMI 21.81 kg/m   Constitutional: Well nourished. Alert and oriented, No acute distress. HEENT: [place] AT, moist mucus membranes. Trachea midline, no masses. Cardiovascular: No clubbing, cyanosis, or edema. Respiratory: Normal respiratory effort, no increased work of breathing. GI: Abdomen is soft, non tender, non distended, no abdominal masses. Liver and spleen not palpable.  No hernias appreciated.  Stool sample for occult testing is not indicated.   GU: No CVA tenderness.  No bladder fullness or masses.  Patient with circumcised phallus.   No penile swelling.   Urethral meatus is patent.  No penile discharge. No penile lesions or rashes. Scrotum with no bruising, no lesions, cysts, rashes and/or edema.  Right scrotum is enlarged, but it is non tender.  No opening is noted in the scrotal suture.  There is no hematoma is noted.  Testicles are located scrotally bilaterally. No masses are appreciated in the testicles. Left and right epididymis are normal. Rectal: Deferred. Skin: No rashes, bruises or suspicious lesions. Lymph: No cervical or inguinal adenopathy. Neurologic: Grossly intact, no focal deficits, moving all 4 extremities. Psychiatric: Normal mood and affect.  Laboratory Data: Lab Results  Component Value Date   WBC 5.2 11/03/2015   HGB 14.3 11/13/2015   HCT 42.0 11/13/2015   MCV 95.0 11/03/2015   PLT 167 11/03/2015    Lab Results  Component Value Date   CREATININE 1.73 (H) 11/03/2015       Component Value Date/Time   CHOL 128 05/29/2012 0421   HDL 54 05/29/2012 0421   VLDL 10 05/29/2012 0421   LDLCALC 64 05/29/2012 0421    Lab Results  Component Value Date   AST 27 05/28/2012   Lab Results  Component Value Date   ALT 30 05/28/2012     Assessment & Plan:    1. Post-operative state:   Patient is status post circumcision and bilateral hydrocelectomy.  A hematoma has resolved.   Follow up prn.    2. Erectile dysfunction  - will give a retrial of Viagra, 100 mg samples are given  - RTC prn   Return if symptoms worsen or fail to improve.  These notes generated with voice recognition software. I apologize for typographical errors.  Zara Council, [facility] [address], [place], [place] [address] [phone]

## 2016-08-23 ENCOUNTER — Inpatient Hospital Stay
Admission: EM | Admit: 2016-08-23 | Discharge: 2016-08-25 | DRG: 291 | Disposition: A | Discharge status: Home / Self Care | Payer: Medicare Other | Attending: Internal Medicine | Admitting: Internal Medicine

## 2016-08-23 ENCOUNTER — Emergency Department: Payer: Medicare Other

## 2016-08-23 ENCOUNTER — Encounter: Payer: Self-pay | Admitting: Emergency Medicine

## 2016-08-23 DIAGNOSIS — I420 Dilated cardiomyopathy: Secondary | ICD-10-CM | POA: Diagnosis present

## 2016-08-23 DIAGNOSIS — N179 Acute kidney failure, unspecified: Secondary | ICD-10-CM | POA: Diagnosis not present

## 2016-08-23 DIAGNOSIS — E785 Hyperlipidemia, unspecified: Secondary | ICD-10-CM | POA: Diagnosis not present

## 2016-08-23 DIAGNOSIS — J9601 Acute respiratory failure with hypoxia: Secondary | ICD-10-CM | POA: Diagnosis present

## 2016-08-23 DIAGNOSIS — N139 Obstructive and reflux uropathy, unspecified: Secondary | ICD-10-CM | POA: Diagnosis present

## 2016-08-23 DIAGNOSIS — R7989 Other specified abnormal findings of blood chemistry: Secondary | ICD-10-CM

## 2016-08-23 DIAGNOSIS — R778 Other specified abnormalities of plasma proteins: Secondary | ICD-10-CM

## 2016-08-23 DIAGNOSIS — Z7982 Long term (current) use of aspirin: Secondary | ICD-10-CM

## 2016-08-23 DIAGNOSIS — I4891 Unspecified atrial fibrillation: Secondary | ICD-10-CM | POA: Diagnosis not present

## 2016-08-23 DIAGNOSIS — N183 Chronic kidney disease, stage 3 unspecified: Secondary | ICD-10-CM

## 2016-08-23 DIAGNOSIS — N401 Enlarged prostate with lower urinary tract symptoms: Secondary | ICD-10-CM | POA: Diagnosis present

## 2016-08-23 DIAGNOSIS — D696 Thrombocytopenia, unspecified: Secondary | ICD-10-CM

## 2016-08-23 DIAGNOSIS — I509 Heart failure, unspecified: Secondary | ICD-10-CM

## 2016-08-23 DIAGNOSIS — Z79899 Other long term (current) drug therapy: Secondary | ICD-10-CM

## 2016-08-23 DIAGNOSIS — I130 Hypertensive heart and chronic kidney disease with heart failure and stage 1 through stage 4 chronic kidney disease, or unspecified chronic kidney disease: Principal | ICD-10-CM | POA: Diagnosis present

## 2016-08-23 DIAGNOSIS — I5023 Acute on chronic systolic (congestive) heart failure: Secondary | ICD-10-CM | POA: Diagnosis not present

## 2016-08-23 DIAGNOSIS — I248 Other forms of acute ischemic heart disease: Secondary | ICD-10-CM | POA: Diagnosis present

## 2016-08-23 DIAGNOSIS — N189 Chronic kidney disease, unspecified: Secondary | ICD-10-CM

## 2016-08-23 DIAGNOSIS — I2510 Atherosclerotic heart disease of native coronary artery without angina pectoris: Secondary | ICD-10-CM | POA: Diagnosis present

## 2016-08-23 DIAGNOSIS — Z7983 Long term (current) use of bisphosphonates: Secondary | ICD-10-CM

## 2016-08-23 DIAGNOSIS — Z8673 Personal history of transient ischemic attack (TIA), and cerebral infarction without residual deficits: Secondary | ICD-10-CM

## 2016-08-23 DIAGNOSIS — Z87891 Personal history of nicotine dependence: Secondary | ICD-10-CM

## 2016-08-23 LAB — BRAIN NATRIURETIC PEPTIDE: B Natriuretic Peptide: 494 pg/mL — ABNORMAL HIGH (ref 0.0–100.0)

## 2016-08-23 LAB — TROPONIN I
TROPONIN I: 0.03 ng/mL — AB (ref <0.03)
TROPONIN I: 0.03 ng/mL — AB (ref <0.03)

## 2016-08-23 LAB — CBC
HCT: 41 % (ref 40.0–52.0)
Hemoglobin: 13.7 g/dL (ref 13.0–18.0)
MCH: 31.7 pg (ref 26.0–34.0)
MCHC: 33.4 g/dL (ref 32.0–36.0)
MCV: 94.7 fL (ref 80.0–100.0)
PLATELETS: 145 10*3/uL — AB (ref 150–440)
RBC: 4.33 MIL/uL — AB (ref 4.40–5.90)
RDW: 15.4 % — ABNORMAL HIGH (ref 11.5–14.5)
WBC: 5.4 10*3/uL (ref 3.8–10.6)

## 2016-08-23 LAB — BASIC METABOLIC PANEL
Anion gap: 7 (ref 5–15)
BUN: 34 mg/dL — ABNORMAL HIGH (ref 6–20)
CALCIUM: 9.1 mg/dL (ref 8.9–10.3)
CO2: 24 mmol/L (ref 22–32)
CREATININE: 2.1 mg/dL — AB (ref 0.61–1.24)
Chloride: 108 mmol/L (ref 101–111)
GFR, EST AFRICAN AMERICAN: 31 mL/min — AB (ref >60)
GFR, EST NON AFRICAN AMERICAN: 26 mL/min — AB (ref >60)
Glucose, Bld: 100 mg/dL — ABNORMAL HIGH (ref 65–99)
Potassium: 4.9 mmol/L (ref 3.5–5.1)
SODIUM: 139 mmol/L (ref 135–145)

## 2016-08-23 LAB — TSH: TSH: 2.29 u[IU]/mL (ref 0.350–4.500)

## 2016-08-23 MED ORDER — FUROSEMIDE 10 MG/ML IJ SOLN
20.0000 mg | Freq: Once | INTRAMUSCULAR | Status: AC
Start: 2016-08-23 — End: 2016-08-23
  Administered 2016-08-23: 20 mg via INTRAVENOUS
  Filled 2016-08-23: qty 4

## 2016-08-23 MED ORDER — FUROSEMIDE 10 MG/ML IJ SOLN: 40.0000 mg | Freq: Two times a day (BID) | INTRAMUSCULAR | Status: DC

## 2016-08-23 MED ORDER — SODIUM CHLORIDE 0.9 % IV SOLN: 250.0000 mL | INTRAVENOUS | Status: DC | PRN

## 2016-08-23 MED ORDER — SODIUM CHLORIDE 0.9% FLUSH
3.0000 mL | Freq: Two times a day (BID) | INTRAVENOUS | Status: DC
  Administered 2016-08-24 – 2016-08-25 (×3): 3 mL via INTRAVENOUS

## 2016-08-23 MED ORDER — TAMSULOSIN HCL 0.4 MG PO CAPS
0.4000 mg | ORAL_CAPSULE | Freq: Every day | ORAL | Status: DC
  Administered 2016-08-24 – 2016-08-25 (×2): 0.4 mg via ORAL
  Filled 2016-08-23 (×2): qty 1

## 2016-08-23 MED ORDER — ATORVASTATIN CALCIUM 10 MG PO TABS
10.0000 mg | ORAL_TABLET | Freq: Every day | ORAL | Status: DC
  Administered 2016-08-24: 10 mg via ORAL
  Filled 2016-08-23: qty 1

## 2016-08-23 MED ORDER — APIXABAN 2.5 MG PO TABS
2.5000 mg | ORAL_TABLET | Freq: Two times a day (BID) | ORAL | Status: DC
  Administered 2016-08-23 – 2016-08-25 (×4): 2.5 mg via ORAL
  Filled 2016-08-23 (×4): qty 1

## 2016-08-23 MED ORDER — ACETAMINOPHEN 325 MG PO TABS: 650.0000 mg | ORAL_TABLET | Freq: Four times a day (QID) | ORAL | Status: DC | PRN

## 2016-08-23 MED ORDER — SODIUM CHLORIDE 0.9% FLUSH: 3.0000 mL | INTRAVENOUS | Status: DC | PRN

## 2016-08-23 MED ORDER — SODIUM CHLORIDE 0.9% FLUSH
3.0000 mL | Freq: Two times a day (BID) | INTRAVENOUS | Status: DC
  Administered 2016-08-23 – 2016-08-25 (×3): 3 mL via INTRAVENOUS

## 2016-08-23 MED ORDER — ALPRAZOLAM 0.25 MG PO TABS
0.2500 mg | ORAL_TABLET | Freq: Every evening | ORAL | Status: DC | PRN
  Administered 2016-08-24: 0.25 mg via ORAL
  Filled 2016-08-23: qty 1

## 2016-08-23 MED ORDER — ONDANSETRON HCL 4 MG/2ML IJ SOLN
4.0000 mg | Freq: Four times a day (QID) | INTRAMUSCULAR | Status: DC | PRN
Start: 2016-08-23 — End: 2016-08-25

## 2016-08-23 MED ORDER — CARVEDILOL 3.125 MG PO TABS
3.1250 mg | ORAL_TABLET | Freq: Two times a day (BID) | ORAL | Status: DC
  Administered 2016-08-24 – 2016-08-25 (×3): 3.125 mg via ORAL
  Filled 2016-08-23 (×3): qty 1

## 2016-08-23 MED ORDER — ONDANSETRON HCL 4 MG PO TABS: 4.0000 mg | ORAL_TABLET | Freq: Four times a day (QID) | ORAL | Status: DC | PRN

## 2016-08-23 MED ORDER — ACETAMINOPHEN 650 MG RE SUPP: 650.0000 mg | Freq: Four times a day (QID) | RECTAL | Status: DC | PRN

## 2016-08-23 MED ORDER — ASPIRIN EC 81 MG PO TBEC
81.0000 mg | DELAYED_RELEASE_TABLET | Freq: Every day | ORAL | Status: DC
  Administered 2016-08-24 – 2016-08-25 (×2): 81 mg via ORAL
  Filled 2016-08-23 (×2): qty 1

## 2016-08-23 MED ORDER — FUROSEMIDE 10 MG/ML IJ SOLN
20.0000 mg | Freq: Two times a day (BID) | INTRAMUSCULAR | Status: DC
  Administered 2016-08-24 – 2016-08-25 (×3): 20 mg via INTRAVENOUS
  Filled 2016-08-23 (×3): qty 2

## 2016-08-23 NOTE — ED Notes (Signed)
Pt up to bathroom in room with no difficulty, distress or shortness of breath.

## 2016-08-23 NOTE — Progress Notes (Signed)
Patient arrived to 2A Room 249. Patient denies pain and all questions answered. Patient oriented to unit and Fall Safety Plan signed. Skin assessment completed with Wendelyn Breslow RN and skin intact. A&Ox4, VSS, and Afib on verified tele-box #[id number]. Wife at bedside and patient is independent in the room. Nursing staff will continue to monitor for any changes in patient status. Earleen Reaper, RN

## 2016-08-23 NOTE — ED Notes (Signed)
Pt ambulated around nursing station, pt's HR afib, irregular and ranges 100-140. Pt reports increased shortness of breath upon exertion, oxygen sat down to 88% on RA upon exertion.

## 2016-08-23 NOTE — H&P (Signed)
[place] [facility] [place] NAME: Bladyn Tipps    MR#:  [id number]  DATE OF BIRTH:  13-May-1926  DATE OF ADMISSION:  08/23/2016  PRIMARY CARE PHYSICIAN: Leonel Ramsay, MD   REQUESTING/REFERRING PHYSICIAN:   CHIEF COMPLAINT:   Chief Complaint  Patient presents with  . Shortness of Breath    HISTORY OF PRESENT ILLNESS: Ruben Hudson  is a 83 y.o. male with a known history of Cardiomyopathy with ejection fraction of 15% as per March 2018. Echocardiogram, prior 32%. As per my review in June 2202, chronic systolic CHF, CAD stage III, BPH, hypertension, cataract, stroke, hyperlipidemia, who presents to the hospital with complaints of shortness of breath, inability to sleep flat, sleeping in the chair for the past 1-2 weeks. Patient endorses PND, feet and ankle edema. His shortness of breath is getting worse, he gained approximately 6 pounds since February, he lost his appetite. He feels somewhat dizzy, presyncopal. On arrival to emergency room, he was noted to be in atrial fibrillation, which is new, but rate controlled. His chest x-ray revealed bilateral pleural effusions. Troponin was noted to be mildly elevated. Hospitalist services were contacted for admission. Patient denies any chest pain.   PAST MEDICAL HISTORY:   Past Medical History:  Diagnosis Date  . Acute pulmonary edema ([place]) 02/26/2013  . Benign prostatic hyperplasia with urinary obstruction 02/24/2012  . BP (high blood pressure) 10/31/2015  . Cataract extraction status of left eye   . Cataract extraction status of right eye   . Cerebrovascular accident (CVA) ([place]) 04/24/2012  . Chronic kidney disease 02/16/2014   stage 2  . Congestive heart failure ([place]) 10/31/2015  . HLD (hyperlipidemia) 10/31/2015  . Hydrocele 02/25/2012  . Unstable angina pectoris ([place]) 02/26/2013  . Urge incontinence of urine 05/30/2014  . Urinary urgency 10/31/2015    PAST SURGICAL HISTORY: Past Surgical  History:  Procedure Laterality Date  . ANKLE FRACTURE SURGERY Right   . CIRCUMCISION N/A 11/13/2015   Procedure: CIRCUMCISION ADULT;  Surgeon: Hollice Espy, MD;  Location: [facility] ORS;  Service: Urology;  Laterality: N/A;  . EYE SURGERY    . HERNIA REPAIR    . HYDROCELE EXCISION Bilateral 11/13/2015   Procedure: HYDROCELECTOMY ADULT;  Surgeon: Hollice Espy, MD;  Location: [facility] ORS;  Service: Urology;  Laterality: Bilateral;  . TONSILLECTOMY  1950's  . TONSILLECTOMY    . UMBILICAL HERNIA REPAIR    . VARICOCELECTOMY  1948  . VASECTOMY      SOCIAL HISTORY:  Social History  Substance Use Topics  . Smoking status: Former Smoker    Quit date: 11/03/1954  . Smokeless tobacco: Never Used  . Alcohol use 4.8 oz/week    7 Shots of liquor, 1 Standard drinks or equivalent per week     Comment: 1 shot of gin a day.    FAMILY HISTORY:  Family History  Problem Relation Age of Onset  . Kidney cancer Neg Hx   . Prostate cancer Neg Hx     DRUG ALLERGIES: No Known Allergies  Review of Systems  Constitutional: Negative for chills, fever and weight loss.  HENT: Negative for congestion.   Eyes: Positive for blurred vision. Negative for double vision.  Respiratory: Positive for cough, sputum production, shortness of breath and wheezing.   Cardiovascular: Positive for orthopnea, leg swelling and PND. Negative for chest pain and palpitations.  Gastrointestinal: Positive for constipation, nausea and vomiting. Negative for abdominal pain, blood in stool and diarrhea.  Genitourinary: Negative for dysuria, frequency, hematuria and urgency.  Musculoskeletal: Negative for falls.  Neurological: Positive for weakness. Negative for dizziness, tremors, focal weakness and headaches.  Endo/Heme/Allergies: Does not bruise/bleed easily.  Psychiatric/Behavioral: Negative for depression. The patient does not have insomnia.     MEDICATIONS AT HOME:  Prior to Admission medications   Medication Sig Start Date  End Date Taking? Authorizing Provider  ALPRAZolam Duanne Moron) 0.25 MG tablet Take 0.25 mg by mouth at bedtime as needed for sleep. 08/19/16  Yes Historical Provider, MD  aspirin 325 MG tablet Take 325 mg by mouth daily.   Yes Historical Provider, MD  atorvastatin (LIPITOR) 10 MG tablet Take 10 mg by mouth. In am.   Yes Historical Provider, MD  calcium-vitamin D (CALCIUM 500/D) 500-200 MG-UNIT tablet Take 1 tablet by mouth daily with breakfast.    Yes Historical Provider, MD  carvedilol (COREG) 3.125 MG tablet Take 3.125 mg by mouth 2 (two) times daily with a meal. 08/19/16  Yes Historical Provider, MD  furosemide (LASIX) 20 MG tablet Take 20 mg by mouth daily. In am.   Yes Historical Provider, MD  lisinopril (PRINIVIL,ZESTRIL) 5 MG tablet Take 5 mg by mouth daily. In am.    Yes Historical Provider, MD  Multiple Vitamin (MULTI-VITAMINS) TABS Take by mouth. In am.   Yes Historical Provider, MD  tamsulosin (FLOMAX) 0.4 MG CAPS capsule Take 0.4 mg by mouth. In am.   Yes Historical Provider, MD  betamethasone dipropionate (DIPROLENE) 0.05 % cream Apply topically 2 (two) times daily. Patient not taking: Reported on 02/15/2016 11/29/15   Larene [place] A McGowan, PA-C  betamethasone dipropionate (DIPROLENE) 0.05 % cream Apply topically 2 (two) times daily. Patient not taking: Reported on 08/23/2016 02/15/16   Larene [place] A McGowan, PA-C  clindamycin (CLEOCIN) 300 MG capsule Take 1 capsule (300 mg total) by mouth 3 (three) times daily. Patient not taking: Reported on 02/15/2016 12/29/15   Larene [place] A McGowan, PA-C  docusate sodium (COLACE) 100 MG capsule Take 1 capsule (100 mg total) by mouth 2 (two) times daily. Patient not taking: Reported on 02/15/2016 11/13/15   Hollice Espy, MD      PHYSICAL EXAMINATION:   VITAL SIGNS: Blood pressure 99/75, pulse (!) 115, temperature 97.2 F (36.2 C), temperature source Oral, resp. rate (!) 25, weight 66.7 kg (147 lb), SpO2 100 %.  GENERAL:  83 y.o.-year-old patient lying in the bed  with no acute distress.  EYES: Pupils equal, round, reactive to light and accommodation. No scleral icterus. Extraocular muscles intact.  HEENT: Head atraumatic, normocephalic. Oropharynx and nasopharynx clear.  NECK:  Supple, positive for jugular venous distention. No thyroid enlargement, no tenderness.  LUNGS: Diminished breath sounds bilaterally, no wheezing, but bilateral basilar rales,rhonchi and crepitations noted. Intermittent use of accessory muscles of respiration.  CARDIOVASCULAR: S1, S2 normal. No murmurs, rubs, or gallops.  ABDOMEN: Soft, nontender, nondistended. Bowel sounds present. No organomegaly or mass.  EXTREMITIES: 2+ lower shin , ankle and pedal edema, no cyanosis, or clubbing.  NEUROLOGIC: Cranial nerves II through XII are intact. Muscle strength 5/5 in all extremities. Sensation intact. Gait not checked.  PSYCHIATRIC: The patient is alert and oriented x 2. Intermittently agitated SKIN: No obvious rash, lesion, or ulcer.   LABORATORY PANEL:   CBC  Recent Labs Lab 08/23/16 1436  WBC 5.4  HGB 13.7  HCT 41.0  PLT 145*  MCV 94.7  MCH 31.7  MCHC 33.4  RDW 15.4*   ------------------------------------------------------------------------------------------------------------------  Chemistries   Recent Labs Lab 08/23/16  1436  NA 139  K 4.9  CL 108  CO2 24  GLUCOSE 100*  BUN 34*  CREATININE 2.10*  CALCIUM 9.1   ------------------------------------------------------------------------------------------------------------------  Cardiac Enzymes  Recent Labs Lab 08/23/16 1436  TROPONINI 0.03*   ------------------------------------------------------------------------------------------------------------------  RADIOLOGY: Dg Chest 2 View  Result Date: 08/23/2016 CLINICAL DATA:  Dyspnea, history of CHF EXAM: CHEST  2 VIEW COMPARISON:  05/28/2012 CXR FINDINGS: There is cardiomegaly with aortic atherosclerosis. Mild interstitial edema with small bilateral  pleural effusions and adjacent bibasilar atelectasis is noted. No acute osseous abnormality. IMPRESSION: Cardiomegaly with aortic atherosclerosis. Mild interstitial edema with small bilateral pleural effusions and adjacent atelectasis. Electronically Signed   By: Ashley Royalty M.D.   On: 08/23/2016 15:02    EKG: Orders placed or performed during the hospital encounter of 08/23/16  . ED EKG  . ED EKG  EKG in the emergency room revealed A. fib at 90 bpm, normal axis, left bundle branch block, nonspecific ST-T changes  IMPRESSION AND PLAN:  Active Problems:   Acute on chronic systolic CHF (congestive heart failure) (HCC)   CKD (chronic kidney disease), stage III   Elevated troponin   Thrombocytopenia ([place])  #1. Acute on chronic systolic CHF, admitted to inpatient medical floor, initiate him on Lasix intravenously, following in's and outs, weight, continue oxygen therapy as needed, wean off oxygen as tolerated #2 CK D stage III, followed with diuresis #3. Atrial fibrillation, rate controlled on beta blocker, get cardiologist involved for recommendations, since the patient's blood pressure is relatively low, even a small dose of diuretics, start Eliquis #4. Elevated troponin, likely demand ischemia, continue aspirin, Coreg, statin, unable to use nitroglycerin due to hypotension   #5 thrombocytopenia, follow with therapy  All the records are reviewed and case discussed with ED provider. Management plans discussed with the patient, family and they are in agreement.  CODE STATUS: Code Status History    This patient does not have a recorded code status. Please follow your organizational policy for patients in this situation.       TOTAL TIME TAKING CARE OF THIS PATIENT50  minutes.    Theodoro Grist M.D on 08/23/2016 at 7:18 PM  Between 7am to 6pm - Pager - [phone] After 6pm go to [email or web address] - password [email or web address] [facility]  Office  [phone]  CC: Primary  care physician; Leonel Ramsay, MD

## 2016-08-23 NOTE — Progress Notes (Signed)
ANTICOAGULATION CONSULT NOTE - Initial Consult  Pharmacy Consult for apixaban Indication: atrial fibrillation  No Known Allergies  Patient Measurements: Height: 5\' 9"  (175.3 cm) Weight: 147 lb (66.7 kg) IBW/kg (Calculated) : 70.7 Heparin Dosing Weight: 66.7kg  Vital Signs: Temp: 97.2 F (36.2 C) (03/30 1434) Temp Source: Oral (03/30 1434) BP: 99/75 (03/30 1909) Pulse Rate: 115 (03/30 1909)  Labs:  Recent Labs  08/23/16 1436  HGB 13.7  HCT 41.0  PLT 145*  CREATININE 2.10*  TROPONINI 0.03*    Estimated Creatinine Clearance: 22.9 mL/min (A) (by C-G formula based on SCr of 2.1 mg/dL (H)).   Medical History: Past Medical History:  Diagnosis Date  . Acute pulmonary edema ([place]) 02/26/2013  . Benign prostatic hyperplasia with urinary obstruction 02/24/2012  . BP (high blood pressure) 10/31/2015  . Cataract extraction status of left eye   . Cataract extraction status of right eye   . Cerebrovascular accident (CVA) ([place]) 04/24/2012  . Chronic kidney disease 02/16/2014   stage 2  . Congestive heart failure ([place]) 10/31/2015  . HLD (hyperlipidemia) 10/31/2015  . Hydrocele 02/25/2012  . Unstable angina pectoris ([place]) 02/26/2013  . Urge incontinence of urine 05/30/2014  . Urinary urgency 10/31/2015    Assessment: 89yoM who presented with SOB found to have atrial fibrillation. Patient not previously on anticoagulation.   Baseline CBC ordered.   Goal of Therapy:  Monitor platelets by anticoagulation protocol: Yes   Plan:  Will initiate apixaban 2.5mg  PO BID based on patients age >85 and SCR >1.5.  CBC ordered for tomorrow.   Loree Fee, PharmD 08/23/2016,7:52 PM

## 2016-08-23 NOTE — ED Provider Notes (Signed)
[facility] Emergency Department Provider Note  ____________________________________________  Time seen: Approximately 5:55 PM  I have reviewed the triage vital signs and the nursing notes.   HISTORY  Chief Complaint Shortness of Breath   HPI Ruben Kimball Appleby. is a 83 y.o. male a history of CHF (EF of 15%), chronic kidney disease, hypertension, hyperlipidemia who presents for evaluation of shortness of breath. Patient has had progressively worsening shortness of breath over the course of the last 2 weeks. Markedly worse last night. Has been sleeping in his recliner, has had orthopnea, lower extremity edema. He denies chest pain. No URI symptoms, no fever or chills, no cough or congestion. No abdominal pain nausea, vomiting, diarrhea. Patient was seen by his cardiologist this week with a repeat echocardiogram 4 days ago showing EF of 15% which was down from 32% from 2017. He endorses compliance with his Lasix. Has been urinating normal.  Past Medical History:  Diagnosis Date  . Acute pulmonary edema ([place]) 02/26/2013  . Benign prostatic hyperplasia with urinary obstruction 02/24/2012  . BP (high blood pressure) 10/31/2015  . Cataract extraction status of left eye   . Cataract extraction status of right eye   . Cerebrovascular accident (CVA) ([place]) 04/24/2012  . Chronic kidney disease 02/16/2014   stage 2  . Congestive heart failure ([place]) 10/31/2015  . HLD (hyperlipidemia) 10/31/2015  . Hydrocele 02/25/2012  . Unstable angina pectoris ([place]) 02/26/2013  . Urge incontinence of urine 05/30/2014  . Urinary urgency 10/31/2015    Patient Active Problem List   Diagnosis Date Noted  . Hyperkalemia 11/06/2015  . Urinary urgency 10/31/2015  . Chronic kidney disease, stage 3 10/31/2015  . Congestive heart failure ([place]) 10/31/2015  . Hyperlipidemia 10/31/2015  . BP (high blood pressure) 10/31/2015  . Cerebrovascular accident (CVA) ([place]) 10/31/2015  . Phimosis 10/31/2015  .  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 Benign fibroma of prostate 05/30/2014  . Urge incontinence of urine 05/30/2014  . Chronic kidney disease 02/16/2014  . Acute pulmonary edema ([place]) 02/26/2013  . Unstable angina pectoris ([place]) 02/26/2013  . Hydrocele 02/25/2012  . Benign prostatic hyperplasia with urinary obstruction 02/24/2012    Past Surgical History:  Procedure Laterality Date  . ANKLE FRACTURE SURGERY Right   . CIRCUMCISION N/A 11/13/2015   Procedure: CIRCUMCISION ADULT;  Surgeon: Hollice Espy, MD;  Location: [facility] ORS;  Service: Urology;  Laterality: N/A;  . EYE SURGERY    . HERNIA REPAIR    . HYDROCELE EXCISION Bilateral 11/13/2015   Procedure: HYDROCELECTOMY ADULT;  Surgeon: Hollice Espy, MD;  Location: [facility] ORS;  Service: Urology;  Laterality: Bilateral;  . TONSILLECTOMY  1950's  . TONSILLECTOMY    . UMBILICAL HERNIA REPAIR    . VARICOCELECTOMY  1948  . VASECTOMY      Prior to Admission medications   Medication Sig Start Date End Date Taking? Authorizing Provider  ALPRAZolam Duanne Moron) 0.25 MG tablet Take 0.25 mg by mouth at bedtime as needed for sleep. 08/19/16  Yes Historical Provider, MD  aspirin 325 MG tablet Take 325 mg by mouth daily.   Yes Historical Provider, MD  atorvastatin (LIPITOR) 10 MG tablet Take 10 mg by mouth. In am.   Yes Historical Provider, MD  calcium-vitamin D (CALCIUM 500/D) 500-200 MG-UNIT tablet Take 1 tablet by mouth daily with breakfast.    Yes Historical Provider, MD  carvedilol (COREG) 3.125 MG tablet Take 3.125 mg by mouth 2 (two) times daily with a meal. 08/19/16  Yes  Historical Provider, MD  furosemide (LASIX) 20 MG tablet Take 20 mg by mouth daily. In am.   Yes Historical Provider, MD  lisinopril (PRINIVIL,ZESTRIL) 5 MG tablet Take 5 mg by mouth daily. In am.    Yes Historical Provider, MD  Multiple Vitamin (MULTI-VITAMINS) TABS Take by mouth. In am.   Yes Historical Provider, MD  tamsulosin (FLOMAX) 0.4 MG CAPS capsule Take  0.4 mg by mouth. In am.   Yes Historical Provider, MD  betamethasone dipropionate (DIPROLENE) 0.05 % cream Apply topically 2 (two) times daily. Patient not taking: Reported on 02/15/2016 11/29/15   Larene Beach A McGowan, PA-C  betamethasone dipropionate (DIPROLENE) 0.05 % cream Apply topically 2 (two) times daily. Patient not taking: Reported on 08/23/2016 02/15/16   Larene Beach A McGowan, PA-C  clindamycin (CLEOCIN) 300 MG capsule Take 1 capsule (300 mg total) by mouth 3 (three) times daily. Patient not taking: Reported on 02/15/2016 12/29/15   Larene Beach A McGowan, PA-C  docusate sodium (COLACE) 100 MG capsule Take 1 capsule (100 mg total) by mouth 2 (two) times daily. Patient not taking: Reported on 02/15/2016 11/13/15   Hollice Espy, MD    Allergies Patient has no known allergies.  Family History  Problem Relation Age of Onset  . Kidney cancer Neg Hx   . Prostate cancer Neg Hx     Social History Social History  Substance Use Topics  . Smoking status: Former Smoker    Quit date: 11/03/1954  . Smokeless tobacco: Never Used  . Alcohol use 4.8 oz/week    7 Shots of liquor, 1 Standard drinks or equivalent per week     Comment: 1 shot of gin a day.    Review of Systems  Constitutional: Negative for fever. Eyes: Negative for visual changes. ENT: Negative for sore throat. Neck: No neck pain  Cardiovascular: Negative for chest pain. Respiratory: + shortness of breath, orthopnea Gastrointestinal: Negative for abdominal pain, vomiting or diarrhea. Genitourinary: Negative for dysuria. Musculoskeletal: Negative for back pain. + b/l LE edema Skin: Negative for rash. Neurological: Negative for headaches, weakness or numbness. Psych: No SI or HI  ____________________________________________   PHYSICAL EXAM:  VITAL SIGNS: ED Triage Vitals  Enc Vitals Group     BP 08/23/16 1434 (!) 95/53     Pulse Rate 08/23/16 1434 79     Resp 08/23/16 1434 (!) 24     Temp 08/23/16 1434 97.2 F (36.2 C)      Temp Source 08/23/16 1434 Oral     SpO2 08/23/16 1434 95 %     Weight 08/23/16 1434 147 lb (66.7 kg)     Height --      Head Circumference --      Peak Flow --      Pain Score 08/23/16 1433 0     Pain Loc --      Pain Edu? --      Excl. in [place]? --     Constitutional: Alert and oriented. Well appearing and in no apparent distress. HEENT:      Head: Normocephalic and atraumatic.         Eyes: Conjunctivae are normal. Sclera is non-icteric. EOMI. PERRL      Mouth/Throat: Mucous membranes are moist.       Neck: Supple with no signs of meningismus. Cardiovascular: Irregularly irregular rhythm with tachycardic rate. No murmurs, gallops, or rubs. 2+ symmetrical distal pulses are present in all extremities. No JVD. Respiratory: Increased work of breathing, crackles on the left base, no  wheezing  Gastrointestinal: Soft, non tender, and non distended with positive bowel sounds. No rebound or guarding. Musculoskeletal: 1+ pitting edema bilateral lower extremities Neurologic: Normal speech and language. Face is symmetric. Moving all extremities. No gross focal neurologic deficits are appreciated. Skin: Skin is warm, dry and intact. No rash noted. Psychiatric: Mood and affect are normal. Speech and behavior are normal.  ____________________________________________   LABS (all labs ordered are listed, but only abnormal results are displayed)  Labs Reviewed  BASIC METABOLIC PANEL - Abnormal; Notable for the following:       Result Value   Glucose, Bld 100 (*)    BUN 34 (*)    Creatinine, Ser 2.10 (*)    GFR calc non Af Amer 26 (*)    GFR calc Af Amer 31 (*)    All other components within normal limits  CBC - Abnormal; Notable for the following:    RBC 4.33 (*)    RDW 15.4 (*)    Platelets 145 (*)    All other components within normal limits  TROPONIN I - Abnormal; Notable for the following:    Troponin I 0.03 (*)    All other components within normal limits  BRAIN NATRIURETIC PEPTIDE  - Abnormal; Notable for the following:    B Natriuretic Peptide 494.0 (*)    All other components within normal limits   ____________________________________________  EKG  ED ECG REPORT I, Rudene Re, the attending physician, personally viewed and interpreted this ECG.  Atrial fibrillation, rate of 90, prolonged QTC, left bundle branch block, no concordant ST elevations. A. fib is new when compared to prior. Left bundle branch block is old when compared 2014. ____________________________________________  RADIOLOGY  CXR: Cardiomegaly with aortic atherosclerosis. Mild interstitial edema with small bilateral pleural effusions and adjacent atelectasis. ____________________________________________   PROCEDURES  Procedure(s) performed: None Procedures Critical Care performed: yes  CRITICAL CARE Performed by: Rudene Re  ?  Total critical care time: 35 min  Critical care time was exclusive of separately billable procedures and treating other patients.  Critical care was necessary to treat or prevent imminent or life-threatening deterioration.  Critical care was time spent personally by me on the following activities: development of treatment plan with patient and/or surrogate as well as nursing, discussions with consultants, evaluation of patient's response to treatment, examination of patient, obtaining history from patient or surrogate, ordering and performing treatments and interventions, ordering and review of laboratory studies, ordering and review of radiographic studies, pulse oximetry and re-evaluation of patient's condition.  ____________________________________________   INITIAL IMPRESSION / ASSESSMENT AND PLAN / ED COURSE  83 y.o. male a history of CHF (EF of 15%), chronic kidney disease, hypertension, hyperlipidemia who presents for evaluation of shortness of breath, orthopnea lower extremity edema. Patient has crackles on exam. New A. fib. Chest x-ray  concerning for cardiomegaly, bilateral pleural effusions, pulmonary edema. Patient ambulated with heart rate of 40 and oxygenation dropping to 88% on room air. Patient has had a new echo done 4 days ago showing new EF of 15%. Patient's ventricular rate is well controlled at rest. Patient is on coreg. Will give lasix 20 mg IV and admit to hospitalist for further diuresis and management of new afib.     Pertinent labs & imaging results that were available during my care of the patient were reviewed by me and considered in my medical decision making (see chart for details).    ____________________________________________   FINAL CLINICAL IMPRESSION(S) / ED DIAGNOSES  Final  diagnoses:  Acute respiratory failure with hypoxia (HCC)  Acute on chronic congestive heart failure, unspecified congestive heart failure type ([place])  Acute renal failure superimposed on chronic kidney disease, unspecified CKD stage, unspecified acute renal failure type (HCC)  Atrial fibrillation, unspecified type ([place])      NEW MEDICATIONS STARTED DURING THIS VISIT:  New Prescriptions   No medications on file     Note:  This document was prepared using Dragon voice recognition software and may include unintentional dictation errors.    Rudene Re, MD 08/23/16 [phone]

## 2016-08-23 NOTE — ED Triage Notes (Signed)
Pt to ed with c/o sob intermittently.  Denies chest pain, but states labored respirations at rest and with any activity.

## 2016-08-24 DIAGNOSIS — I130 Hypertensive heart and chronic kidney disease with heart failure and stage 1 through stage 4 chronic kidney disease, or unspecified chronic kidney disease: Secondary | ICD-10-CM | POA: Diagnosis not present

## 2016-08-24 DIAGNOSIS — J9601 Acute respiratory failure with hypoxia: Secondary | ICD-10-CM | POA: Diagnosis not present

## 2016-08-24 LAB — BASIC METABOLIC PANEL
ANION GAP: 8 (ref 5–15)
BUN: 34 mg/dL — ABNORMAL HIGH (ref 6–20)
CALCIUM: 9.2 mg/dL (ref 8.9–10.3)
CO2: 25 mmol/L (ref 22–32)
CREATININE: 2.12 mg/dL — AB (ref 0.61–1.24)
Chloride: 107 mmol/L (ref 101–111)
GFR, EST AFRICAN AMERICAN: 30 mL/min — AB (ref >60)
GFR, EST NON AFRICAN AMERICAN: 26 mL/min — AB (ref >60)
GLUCOSE: 90 mg/dL (ref 65–99)
Potassium: 4.2 mmol/L (ref 3.5–5.1)
Sodium: 140 mmol/L (ref 135–145)

## 2016-08-24 LAB — CBC
HCT: 41.8 % (ref 40.0–52.0)
HEMOGLOBIN: 14 g/dL (ref 13.0–18.0)
MCH: 31.5 pg (ref 26.0–34.0)
MCHC: 33.6 g/dL (ref 32.0–36.0)
MCV: 93.7 fL (ref 80.0–100.0)
PLATELETS: 153 10*3/uL (ref 150–440)
RBC: 4.45 MIL/uL (ref 4.40–5.90)
RDW: 15.3 % — ABNORMAL HIGH (ref 11.5–14.5)
WBC: 5.9 10*3/uL (ref 3.8–10.6)

## 2016-08-24 LAB — GLUCOSE, CAPILLARY: GLUCOSE-CAPILLARY: 86 mg/dL (ref 65–99)

## 2016-08-24 LAB — TROPONIN I
TROPONIN I: 0.03 ng/mL — AB (ref <0.03)
Troponin I: 0.04 ng/mL (ref <0.03)

## 2016-08-24 NOTE — Progress Notes (Signed)
[facility] [place] NAME: Ruben Hudson    MR#:  [id number]  DATE OF BIRTH:  November 21, 1925  SUBJECTIVE:  CHIEF COMPLAINT:   Chief Complaint  Patient presents with  . Shortness of Breath   The patient still complains of shortness of breath, cough and orthopnea. Leg edema is better. REVIEW OF SYSTEMS:  Review of Systems  Constitutional: Negative for chills, fever and malaise/fatigue.  HENT: Negative for congestion.   Eyes: Negative for blurred vision and double vision.  Respiratory: Positive for cough and shortness of breath. Negative for hemoptysis, sputum production and stridor.   Cardiovascular: Positive for orthopnea. Negative for chest pain, palpitations, claudication and leg swelling.  Gastrointestinal: Negative for abdominal pain, blood in stool, constipation, diarrhea, melena, nausea and vomiting.  Genitourinary: Negative for dysuria and hematuria.  Musculoskeletal: Negative for back pain.  Neurological: Negative for dizziness, focal weakness, loss of consciousness and weakness.  Psychiatric/Behavioral: Negative for depression. The patient is not nervous/anxious.     DRUG ALLERGIES:  No Known Allergies VITALS:  Blood pressure (!) 108/54, pulse 76, temperature 97.9 F (36.6 C), resp. rate 14, height 5\' 9"  (1.753 m), weight 148 lb 4.8 oz (67.3 kg), SpO2 100 %. PHYSICAL EXAMINATION:  Physical Exam  Constitutional: He is oriented to person, place, and time and well-developed, well-nourished, and in no distress.  HENT:  Head: Normocephalic.  Mouth/Throat: Oropharynx is clear and moist.  Eyes: Conjunctivae and EOM are normal.  Neck: Normal range of motion. Neck supple. No JVD present. No tracheal deviation present.  Cardiovascular: Normal rate, regular rhythm and normal heart sounds.  Exam reveals no gallop.   No murmur heard. Pulmonary/Chest: Effort normal. No respiratory distress. He has no wheezes. He has rales.  Abdominal:  Soft. Bowel sounds are normal. He exhibits no distension. There is no tenderness.  Musculoskeletal: Normal range of motion. He exhibits no edema or tenderness.  Neurological: He is alert and oriented to person, place, and time. No cranial nerve deficit.  Skin: No rash noted. No erythema.  Psychiatric: Affect normal.   LABORATORY PANEL:  Male CBC  Recent Labs Lab 08/24/16 0740  WBC 5.9  HGB 14.0  HCT 41.8  PLT 153   ------------------------------------------------------------------------------------------------------------------ Chemistries   Recent Labs Lab 08/24/16 0740  NA 140  K 4.2  CL 107  CO2 25  GLUCOSE 90  BUN 34*  CREATININE 2.12*  CALCIUM 9.2   RADIOLOGY:  Dg Chest 2 View  Result Date: 08/23/2016 CLINICAL DATA:  Dyspnea, history of CHF EXAM: CHEST  2 VIEW COMPARISON:  05/28/2012 CXR FINDINGS: There is cardiomegaly with aortic atherosclerosis. Mild interstitial edema with small bilateral pleural effusions and adjacent bibasilar atelectasis is noted. No acute osseous abnormality. IMPRESSION: Cardiomegaly with aortic atherosclerosis. Mild interstitial edema with small bilateral pleural effusions and adjacent atelectasis. Electronically Signed   By: Ashley Royalty M.D.   On: 08/23/2016 15:02   ASSESSMENT AND PLAN:   #1. Acute on chronic systolic CHF, EF 60%. Continue  Lasix intravenously bid, following in's and outs, weight, continue oxygen therapy as needed. Continue diuresis per Dr. Saralyn Pilar.  #2 ARF on CKD stage III, follow up BMP while on diuresis #3. Atrial fibrillation, rate controlled on beta blocker, started Eliquis. Agree with Eliquis 2.5 mg twice a day per Dr. Saralyn Pilar.  #4. Elevated troponin, likely demand ischemia, continue aspirin, Coreg, statin, unable to use nitroglycerin due to hypotension    #5 thrombocytopenia, stable.  All the records are reviewed  and case discussed with Care Management/Social Worker. Management plans discussed with the  patient, wife and son and they are in agreement.  CODE STATUS: Full Code  TOTAL TIME TAKING CARE OF THIS PATIENT: 38 minutes.   More than 50% of the time was spent in counseling/coordination of care: YES  POSSIBLE D/C IN 2 DAYS, DEPENDING ON CLINICAL CONDITION.   [name] M.D on 08/24/2016 at 1:53 PM  Between 7am to 6pm - Pager - [phone]  After 6pm go to [email or web address] - Technical brewer [facility]  Office  [phone]  CC: Primary care physician; FITZGERALD, DAVID Mamie Nick, MD  Note: This dictation was prepared with Dragon dictation along with smaller phrase technology. Any transcriptional errors that result from this process are unintentional.

## 2016-08-24 NOTE — Consult Note (Signed)
[facility]  CARDIOLOGY CONSULT NOTE  Patient ID: Ruben Pandy Sr. MRN: [id number] DOB/AGE: 09/26/1925 83 y.o.  Admit date: 08/23/2016 Referring Physician Bridgett Larsson Primary Physician [facility] Primary Cardiologist Fath Reason for Consultation Congestive heart failure  HPI: 83 year old gentleman referred for evaluation of congestive heart failure. The patient has known history of nonischemic dilated cardiomyopathy and chronic systolic congestive heart failure. ETT sestamibi study 16/04/2016 revealed LVEF 32% with mild inferior scar without ischemia. 2-D echocardiogram 08/19/2016 revealed dilated cardiomyopathy, with LVEF less than 15%. The patient presents to [facility] with one to two-week history of increasing shortness of breath, peripheral edema, orthopnea, and weight gain. The patient denies chest pain. Admission labs were notable for negative troponin of 0.03. ECG revealed atrial fibrillation with left bundle-branch block at a rate of 90 bpm. The patient has no prior history of atrial fibrillation. He denies palpitations. Patient has a chadsvasc of 6, and was started on Eliquis for stroke prevention.  Review of systems complete and found to be negative unless listed above     Past Medical History:  Diagnosis Date  . Acute pulmonary edema ([place]) 02/26/2013  . Benign prostatic hyperplasia with urinary obstruction 02/24/2012  . BP (high blood pressure) 10/31/2015  . Cataract extraction status of left eye   . Cataract extraction status of right eye   . Cerebrovascular accident (CVA) ([place]) 04/24/2012  . Chronic kidney disease 02/16/2014   stage 2  . Congestive heart failure ([place]) 10/31/2015  . HLD (hyperlipidemia) 10/31/2015  . Hydrocele 02/25/2012  . Unstable angina pectoris ([place]) 02/26/2013  . Urge incontinence of urine 05/30/2014  . Urinary urgency 10/31/2015    Past Surgical History:  Procedure Laterality Date  . ANKLE FRACTURE SURGERY Right   . CIRCUMCISION N/A 11/13/2015   Procedure:  CIRCUMCISION ADULT;  Surgeon: Hollice Espy, MD;  Location: [facility] ORS;  Service: Urology;  Laterality: N/A;  . EYE SURGERY    . HERNIA REPAIR    . HYDROCELE EXCISION Bilateral 11/13/2015   Procedure: HYDROCELECTOMY ADULT;  Surgeon: Hollice Espy, MD;  Location: [facility] ORS;  Service: Urology;  Laterality: Bilateral;  . TONSILLECTOMY  1950's  . TONSILLECTOMY    . UMBILICAL HERNIA REPAIR    . VARICOCELECTOMY  1948  . VASECTOMY      Prescriptions Prior to Admission  Medication Sig Dispense Refill Last Dose  . ALPRAZolam (XANAX) 0.25 MG tablet Take 0.25 mg by mouth at bedtime as needed for sleep.  2 08/22/2016 at 2000  . aspirin 325 MG tablet Take 325 mg by mouth daily.   08/23/2016 at 0800  . atorvastatin (LIPITOR) 10 MG tablet Take 10 mg by mouth. In am.   08/23/2016 at 0800  . calcium-vitamin D (CALCIUM 500/D) 500-200 MG-UNIT tablet Take 1 tablet by mouth daily with breakfast.    Past Week at Unknown time  . carvedilol (COREG) 3.125 MG tablet Take 3.125 mg by mouth 2 (two) times daily with a meal.  11 08/23/2016 at 0800  . furosemide (LASIX) 20 MG tablet Take 20 mg by mouth daily. In am.   08/23/2016 at 0800  . lisinopril (PRINIVIL,ZESTRIL) 5 MG tablet Take 5 mg by mouth daily. In am.    08/23/2016 at 0800  . Multiple Vitamin (MULTI-VITAMINS) TABS Take by mouth. In am.   Past Week at Unknown time  . tamsulosin (FLOMAX) 0.4 MG CAPS capsule Take 0.4 mg by mouth. In am.   08/23/2016 at 0800  . betamethasone dipropionate (DIPROLENE) 0.05 % cream Apply topically 2 (two)  times daily. (Patient not taking: Reported on 02/15/2016) 30 g 0 Not Taking  . betamethasone dipropionate (DIPROLENE) 0.05 % cream Apply topically 2 (two) times daily. (Patient not taking: Reported on 08/23/2016) 30 g 0 Completed Course at Unknown time  . clindamycin (CLEOCIN) 300 MG capsule Take 1 capsule (300 mg total) by mouth 3 (three) times daily. (Patient not taking: Reported on 02/15/2016) 30 capsule 0 Not Taking  . docusate sodium  (COLACE) 100 MG capsule Take 1 capsule (100 mg total) by mouth 2 (two) times daily. (Patient not taking: Reported on 02/15/2016) 60 capsule 0 Not Taking   Social History   Social History  . Marital status: Married    Spouse name: N/A  . Number of children: N/A  . Years of education: N/A   Occupational History  . Not on file.   Social History Main Topics  . Smoking status: Former Smoker    Quit date: 11/03/1954  . Smokeless tobacco: Never Used  . Alcohol use 4.8 oz/week    7 Shots of liquor, 1 Standard drinks or equivalent per week     Comment: 1 shot of gin a day.  . Drug use: No  . Sexual activity: Not on file   Other Topics Concern  . Not on file   Social History Narrative  . No narrative on file    Family History  Problem Relation Age of Onset  . Kidney cancer Neg Hx   . Prostate cancer Neg Hx       Review of systems complete and found to be negative unless listed above      PHYSICAL EXAM  General: Well developed, well nourished, in no acute distress HEENT:  Normocephalic and atramatic Neck:  No JVD.  Lungs: Clear bilaterally to auscultation and percussion. Heart: HRRR . Normal S1 and S2 without gallops or murmurs.  Abdomen: Bowel sounds are positive, abdomen soft and non-tender  Msk:  Back normal, normal gait. Normal strength and tone for age. Extremities: No clubbing, cyanosis or edema.   Neuro: Alert and oriented X 3. Psych:  Good affect, responds appropriately  Labs:   Lab Results  Component Value Date   WBC 5.9 08/24/2016   HGB 14.0 08/24/2016   HCT 41.8 08/24/2016   MCV 93.7 08/24/2016   PLT 153 08/24/2016    Recent Labs Lab 08/24/16 0740  NA 140  K 4.2  CL 107  CO2 25  BUN 34*  CREATININE 2.12*  CALCIUM 9.2  GLUCOSE 90   Lab Results  Component Value Date   CKTOTAL 157 05/28/2012   CKMB 4.3 (H) 05/28/2012   TROPONINI 0.03 (HH) 08/24/2016    Lab Results  Component Value Date   CHOL 128 05/29/2012   Lab Results  Component  Value Date   HDL 54 05/29/2012   Lab Results  Component Value Date   LDLCALC 64 05/29/2012   Lab Results  Component Value Date   TRIG 52 05/29/2012   No results found for: CHOLHDL No results found for: LDLDIRECT    Radiology: Dg Chest 2 View  Result Date: 08/23/2016 CLINICAL DATA:  Dyspnea, history of CHF EXAM: CHEST  2 VIEW COMPARISON:  05/28/2012 CXR FINDINGS: There is cardiomegaly with aortic atherosclerosis. Mild interstitial edema with small bilateral pleural effusions and adjacent bibasilar atelectasis is noted. No acute osseous abnormality. IMPRESSION: Cardiomegaly with aortic atherosclerosis. Mild interstitial edema with small bilateral pleural effusions and adjacent atelectasis. Electronically Signed   By: Ashley Royalty M.D.   On:  08/23/2016 15:02    EKG: Atrial fibrillation with left bundle-branch block  ASSESSMENT AND PLAN:   1. Acute on chronic systolic congestive heart failure, improved after initial diuresis 2. Known nonischemic dilated cardiomyopathy 3. Atrial fibrillation, rate controlled, chadsVasc 6  Recommendations  1. Agree with overall current therapy 2. Continue diuresis 3. Carefully monitor renal status 4. Agree with Eliquis 2.5 mg twice a day 5. Defer further cardiac diagnostics at this time   Signed: Isaias Cowman MD,PhD, [facility] 08/24/2016, 9:03 AM

## 2016-08-25 DIAGNOSIS — Z87891 Personal history of nicotine dependence: Secondary | ICD-10-CM | POA: Diagnosis not present

## 2016-08-25 DIAGNOSIS — N183 Chronic kidney disease, stage 3 (moderate): Secondary | ICD-10-CM | POA: Diagnosis present

## 2016-08-25 DIAGNOSIS — I420 Dilated cardiomyopathy: Secondary | ICD-10-CM | POA: Diagnosis present

## 2016-08-25 DIAGNOSIS — N179 Acute kidney failure, unspecified: Secondary | ICD-10-CM | POA: Diagnosis present

## 2016-08-25 DIAGNOSIS — N401 Enlarged prostate with lower urinary tract symptoms: Secondary | ICD-10-CM | POA: Diagnosis present

## 2016-08-25 DIAGNOSIS — I5023 Acute on chronic systolic (congestive) heart failure: Secondary | ICD-10-CM | POA: Diagnosis present

## 2016-08-25 DIAGNOSIS — I248 Other forms of acute ischemic heart disease: Secondary | ICD-10-CM | POA: Diagnosis present

## 2016-08-25 DIAGNOSIS — I4891 Unspecified atrial fibrillation: Secondary | ICD-10-CM | POA: Diagnosis present

## 2016-08-25 DIAGNOSIS — I130 Hypertensive heart and chronic kidney disease with heart failure and stage 1 through stage 4 chronic kidney disease, or unspecified chronic kidney disease: Secondary | ICD-10-CM | POA: Diagnosis present

## 2016-08-25 DIAGNOSIS — J9601 Acute respiratory failure with hypoxia: Secondary | ICD-10-CM | POA: Diagnosis present

## 2016-08-25 DIAGNOSIS — Z7983 Long term (current) use of bisphosphonates: Secondary | ICD-10-CM | POA: Diagnosis not present

## 2016-08-25 DIAGNOSIS — Z79899 Other long term (current) drug therapy: Secondary | ICD-10-CM | POA: Diagnosis not present

## 2016-08-25 DIAGNOSIS — Z8673 Personal history of transient ischemic attack (TIA), and cerebral infarction without residual deficits: Secondary | ICD-10-CM | POA: Diagnosis not present

## 2016-08-25 DIAGNOSIS — I2510 Atherosclerotic heart disease of native coronary artery without angina pectoris: Secondary | ICD-10-CM | POA: Diagnosis present

## 2016-08-25 DIAGNOSIS — D696 Thrombocytopenia, unspecified: Secondary | ICD-10-CM | POA: Diagnosis present

## 2016-08-25 DIAGNOSIS — E785 Hyperlipidemia, unspecified: Secondary | ICD-10-CM | POA: Diagnosis present

## 2016-08-25 DIAGNOSIS — Z7982 Long term (current) use of aspirin: Secondary | ICD-10-CM | POA: Diagnosis not present

## 2016-08-25 DIAGNOSIS — N139 Obstructive and reflux uropathy, unspecified: Secondary | ICD-10-CM | POA: Diagnosis present

## 2016-08-25 LAB — BASIC METABOLIC PANEL
Anion gap: 8 (ref 5–15)
BUN: 31 mg/dL — AB (ref 6–20)
CHLORIDE: 108 mmol/L (ref 101–111)
CO2: 24 mmol/L (ref 22–32)
Calcium: 8.8 mg/dL — ABNORMAL LOW (ref 8.9–10.3)
Creatinine, Ser: 1.92 mg/dL — ABNORMAL HIGH (ref 0.61–1.24)
GFR, EST AFRICAN AMERICAN: 34 mL/min — AB (ref >60)
GFR, EST NON AFRICAN AMERICAN: 30 mL/min — AB (ref >60)
Glucose, Bld: 83 mg/dL (ref 65–99)
POTASSIUM: 3.7 mmol/L (ref 3.5–5.1)
SODIUM: 140 mmol/L (ref 135–145)

## 2016-08-25 LAB — MAGNESIUM: MAGNESIUM: 2.1 mg/dL (ref 1.7–2.4)

## 2016-08-25 LAB — HEMOGLOBIN A1C
Hgb A1c MFr Bld: 5.7 % — ABNORMAL HIGH (ref 4.8–5.6)
MEAN PLASMA GLUCOSE: 117 mg/dL

## 2016-08-25 LAB — GLUCOSE, CAPILLARY: GLUCOSE-CAPILLARY: 84 mg/dL (ref 65–99)

## 2016-08-25 MED ORDER — ASPIRIN 81 MG PO TBEC: 81.0000 mg | DELAYED_RELEASE_TABLET | Freq: Every day | ORAL | 2 refills | Status: DC

## 2016-08-25 MED ORDER — FUROSEMIDE 20 MG PO TABS: 20.0000 mg | ORAL_TABLET | Freq: Two times a day (BID) | ORAL | 2 refills | Status: DC

## 2016-08-25 MED ORDER — APIXABAN 2.5 MG PO TABS: 2.5000 mg | ORAL_TABLET | Freq: Two times a day (BID) | ORAL | 2 refills | Status: DC

## 2016-08-25 MED ORDER — FUROSEMIDE 20 MG PO TABS: 20.0000 mg | ORAL_TABLET | Freq: Every day | ORAL | 2 refills | Status: DC

## 2016-08-25 NOTE — Progress Notes (Signed)
[facility]  SUBJECTIVE: I feel better   Vitals:   08/24/16 1718 08/24/16 1957 08/25/16 0358 08/25/16 0808  BP: 108/63 101/71 (!) 102/58 110/61  Pulse: 85 (!) 163 94 97  Resp:  18 18 12   Temp:  97.6 F (36.4 C) 97.3 F (36.3 C) 97.7 F (36.5 C)  TempSrc:  Oral Oral Oral  SpO2:  96% 98% 99%  Weight:   67.2 kg (148 lb 3.2 oz)   Height:         Intake/Output Summary (Last 24 hours) at 08/25/16 1023 Last data filed at 08/25/16 0900  Gross per 24 hour  Intake              480 ml  Output             1750 ml  Net            -1270 ml      PHYSICAL EXAM  General: Well developed, well nourished, in no acute distress HEENT:  Normocephalic and atramatic Neck:  No JVD.  Lungs: Clear bilaterally to auscultation and percussion. Heart: HRRR . Normal S1 and S2 without gallops or murmurs.  Abdomen: Bowel sounds are positive, abdomen soft and non-tender  Msk:  Back normal, normal gait. Normal strength and tone for age. Extremities: No clubbing, cyanosis or edema.   Neuro: Alert and oriented X 3. Psych:  Good affect, responds appropriately   LABS: Basic Metabolic Panel:  Recent Labs  08/24/16 0740 08/25/16 0521  NA 140 140  K 4.2 3.7  CL 107 108  CO2 25 24  GLUCOSE 90 83  BUN 34* 31*  CREATININE 2.12* 1.92*  CALCIUM 9.2 8.8*  MG  --  2.1   Liver Function Tests: No results for input(s): AST, ALT, ALKPHOS, BILITOT, PROT, ALBUMIN in the last 72 hours. No results for input(s): LIPASE, AMYLASE in the last 72 hours. CBC:  Recent Labs  08/23/16 1436 08/24/16 0740  WBC 5.4 5.9  HGB 13.7 14.0  HCT 41.0 41.8  MCV 94.7 93.7  PLT 145* 153   Cardiac Enzymes:  Recent Labs  08/23/16 2003 08/24/16 0131 08/24/16 0740  TROPONINI 0.03* 0.04* 0.03*   BNP: Invalid input(s): POCBNP D-Dimer: No results for input(s): DDIMER in the last 72 hours. Hemoglobin A1C: No results for input(s): HGBA1C in the last 72 hours. Fasting Lipid Panel: No results for input(s): CHOL,  HDL, LDLCALC, TRIG, CHOLHDL, LDLDIRECT in the last 72 hours. Thyroid Function Tests:  Recent Labs  08/23/16 2005  TSH 2.290   Anemia Panel: No results for input(s): VITAMINB12, FOLATE, FERRITIN, TIBC, IRON, RETICCTPCT in the last 72 hours.  Dg Chest 2 View  Result Date: 08/23/2016 CLINICAL DATA:  Dyspnea, history of CHF EXAM: CHEST  2 VIEW COMPARISON:  05/28/2012 CXR FINDINGS: There is cardiomegaly with aortic atherosclerosis. Mild interstitial edema with small bilateral pleural effusions and adjacent bibasilar atelectasis is noted. No acute osseous abnormality. IMPRESSION: Cardiomegaly with aortic atherosclerosis. Mild interstitial edema with small bilateral pleural effusions and adjacent atelectasis. Electronically Signed   By: Ashley Royalty M.D.   On: 08/23/2016 15:02     Echo   TELEMETRY: Atrial fibrillation:  ASSESSMENT AND PLAN:  Active Problems:   Acute on chronic systolic CHF (congestive heart failure) (HCC)   CKD (chronic kidney disease), stage III   Elevated troponin   Thrombocytopenia (HCC)    1. Acute on chronic systolic congestive heart failure, improved after diuresis 2. Known nonischemic dilated cardiomyopathy 3. Atrial fibrillation, rate  controlled, chasVasc 6, on Eliquis  Recommendations  1. Agree with current therapy 2. Continue Eliquis for stroke prevention 3. Defer further cardiac diagnostics at this time 4. Follow-up with Dr. Ubaldo Glassing  Sign off for now, please call if any questions   Isaias Cowman, MD, PhD, [facility] 08/25/2016 10:23 AM

## 2016-08-25 NOTE — Care Management Note (Signed)
Case Management Note  Patient Details  Name: Tavaras Goody Sr. MRN: [id number] Date of Birth: 09-14-1925  Subjective/Objective:      Eliquis coupon was provided for Mr Haynesworth.              Action/Plan:   Expected Discharge Date:  08/25/16               Expected Discharge Plan:     In-House Referral:     Discharge planning Services     Post Acute Care Choice:    Choice offered to:     DME Arranged:    DME Agency:     HH Arranged:    HH Agency:     Status of Service:     If discussed at [employer], dates discussed:    Additional Comments:  Charlee Squibb A, RN 08/25/2016, 11:18 AM

## 2016-08-25 NOTE — Progress Notes (Signed)
Discharged to home with his wife. Instructions given about taking eliquis, including the patient ed sheet.  He is aware of the importance of weighing himself daily for the CHF.

## 2016-08-25 NOTE — Discharge Instructions (Signed)
Heart healthy diet

## 2016-08-25 NOTE — Discharge Summary (Signed)
[facility] NAME: Ruben Hudson    MR#:  [id number]  DATE OF BIRTH:  12/18/1925  DATE OF ADMISSION:  08/23/2016   ADMITTING PHYSICIAN: Theodoro Grist, MD  DATE OF DISCHARGE: 08/25/2016  PRIMARY CARE PHYSICIAN: Leonel Ramsay, MD   ADMISSION DIAGNOSIS:  Acute respiratory failure with hypoxia (HCC) [J96.01] Acute on chronic congestive heart failure, unspecified congestive heart failure type ([place]) [I50.9] Atrial fibrillation, unspecified type (HCC) [I48.91] Acute renal failure superimposed on chronic kidney disease, unspecified CKD stage, unspecified acute renal failure type ([place]) [N17.9, N18.9] DISCHARGE DIAGNOSIS:  Active Problems:   Acute on chronic systolic CHF (congestive heart failure) (HCC)   CKD (chronic kidney disease), stage III   Elevated troponin   Thrombocytopenia ([place])  SECONDARY DIAGNOSIS:   Past Medical History:  Diagnosis Date  . Acute pulmonary edema ([place]) 02/26/2013  . Benign prostatic hyperplasia with urinary obstruction 02/24/2012  . BP (high blood pressure) 10/31/2015  . Cataract extraction status of left eye   . Cataract extraction status of right eye   . Cerebrovascular accident (CVA) ([place]) 04/24/2012  . Chronic kidney disease 02/16/2014   stage 2  . Congestive heart failure ([place]) 10/31/2015  . HLD (hyperlipidemia) 10/31/2015  . Hydrocele 02/25/2012  . Unstable angina pectoris ([place]) 02/26/2013  . Urge incontinence of urine 05/30/2014  . Urinary urgency 10/31/2015   HOSPITAL COURSE:   #1. Acute on chronic systolic CHF, EF 02%. The patient has been treated with  Lasix intravenously bid, changed to 20 mg by mouth twice a day, Continue diuresis per Dr. Saralyn Pilar.  #2 ARF on CKD stage III, follow up BMP while on diuresis. Improving to near baseline. #3. Atrial fibrillation, rate controlled on beta blocker, started Eliquis. Agree with Eliquis 2.5 mg twice a day per Dr. Saralyn Pilar. The benefits and side effect of  bleeding of Eliquis were explained.  #4. Elevated troponin, likely demand ischemia, continue aspirin, Coreg, statin, unable to use nitroglycerin due to hypotension BP is normal.  #5 thrombocytopenia, stable.  DISCHARGE CONDITIONS:  Stable, discharge to home today. CONSULTS OBTAINED:  Treatment Team:  Teodoro Spray, MD Isaias Cowman, MD DRUG ALLERGIES:  No Known Allergies DISCHARGE MEDICATIONS:   Allergies as of 08/25/2016   No Known Allergies     Medication List    STOP taking these medications   aspirin 325 MG tablet Replaced by:  aspirin 81 MG EC tablet   clindamycin 300 MG capsule Commonly known as:  CLEOCIN     TAKE these medications   ALPRAZolam 0.25 MG tablet Commonly known as:  XANAX Take 0.25 mg by mouth at bedtime as needed for sleep.   apixaban 2.5 MG Tabs tablet Commonly known as:  ELIQUIS Take 1 tablet (2.5 mg total) by mouth 2 (two) times daily.   aspirin 81 MG EC tablet Take 1 tablet (81 mg total) by mouth daily. Replaces:  aspirin 325 MG tablet   atorvastatin 10 MG tablet Commonly known as:  LIPITOR Take 10 mg by mouth. In am.   betamethasone dipropionate 0.05 % cream Commonly known as:  DIPROLENE Apply topically 2 (two) times daily.   betamethasone dipropionate 0.05 % cream Commonly known as:  DIPROLENE Apply topically 2 (two) times daily.   CALCIUM 500/D 500-200 MG-UNIT tablet Generic drug:  calcium-vitamin D Take 1 tablet by mouth daily with breakfast. Notes to patient:  None given today   carvedilol 3.125 MG tablet Commonly known as:  COREG Take 3.125  mg by mouth 2 (two) times daily with a meal.   docusate sodium 100 MG capsule Commonly known as:  COLACE Take 1 capsule (100 mg total) by mouth 2 (two) times daily.   furosemide 20 MG tablet Commonly known as:  LASIX Take 1 tablet (20 mg total) by mouth 2 (two) times daily. What changed:  when to take this  additional instructions   lisinopril 5 MG tablet Commonly  known as:  PRINIVIL,ZESTRIL Take 5 mg by mouth daily. In am. Notes to patient:  When you get home   MULTI-VITAMINS Tabs Take by mouth. In am. Notes to patient:  None given today   tamsulosin 0.4 MG Caps capsule Commonly known as:  FLOMAX Take 0.4 mg by mouth. In am.        DISCHARGE INSTRUCTIONS:  See AVS.  If you experience worsening of your admission symptoms, develop shortness of breath, life threatening emergency, suicidal or homicidal thoughts you must seek medical attention immediately by calling 911 or calling your MD immediately  if symptoms less severe.  You Must read complete instructions/literature along with all the possible adverse reactions/side effects for all the Medicines you take and that have been prescribed to you. Take any new Medicines after you have completely understood and accpet all the possible adverse reactions/side effects.   Please note  You were cared for by a hospitalist during your hospital stay. If you have any questions about your discharge medications or the care you received while you were in the hospital after you are discharged, you can call the unit and asked to speak with the hospitalist on call if the hospitalist that took care of you is not available. Once you are discharged, your primary care physician will handle any further medical issues. Please note that NO REFILLS for any discharge medications will be authorized once you are discharged, as it is imperative that you return to your primary care physician (or establish a relationship with a primary care physician if you do not have one) for your aftercare needs so that they can reassess your need for medications and monitor your lab values.    On the day of Discharge:  VITAL SIGNS:  Blood pressure 110/61, pulse 97, temperature 97.7 F (36.5 C), temperature source Oral, resp. rate 12, height 5\' 9"  (1.753 m), weight 148 lb 3.2 oz (67.2 kg), SpO2 99 %. PHYSICAL EXAMINATION:  GENERAL:  83  y.o.-year-old patient lying in the bed with no acute distress.  EYES: Pupils equal, round, reactive to light and accommodation. No scleral icterus. Extraocular muscles intact.  HEENT: Head atraumatic, normocephalic. Oropharynx and nasopharynx clear.  NECK:  Supple, no jugular venous distention. No thyroid enlargement, no tenderness.  LUNGS: Normal breath sounds bilaterally, no wheezing, rales,rhonchi or crepitation. No use of accessory muscles of respiration.  CARDIOVASCULAR: S1, S2 normal. No murmurs, rubs, or gallops.  ABDOMEN: Soft, non-tender, non-distended. Bowel sounds present. No organomegaly or mass.  EXTREMITIES: No pedal edema, cyanosis, or clubbing.  NEUROLOGIC: Cranial nerves II through XII are intact. Muscle strength 5/5 in all extremities. Sensation intact. Gait not checked.  PSYCHIATRIC: The patient is alert and oriented x 3.  SKIN: No obvious rash, lesion, or ulcer.  DATA REVIEW:   CBC  Recent Labs Lab 08/24/16 0740  WBC 5.9  HGB 14.0  HCT 41.8  PLT 153    Chemistries   Recent Labs Lab 08/25/16 0521  NA 140  K 3.7  CL 108  CO2 24  GLUCOSE 83  BUN 31*  CREATININE 1.92*  CALCIUM 8.8*  MG 2.1     Microbiology Results  Results for orders placed or performed in visit on 11/22/15  CULTURE, URINE COMPREHENSIVE     Status: None   Collection Time: 11/22/15  3:14 PM  Result Value Ref Range Status   Urine Culture, Comprehensive Final report  Final   Result 1 Comment  Final    Comment: No growth in 36 - 48 hours.  Microscopic Examination     Status: None   Collection Time: 11/22/15  3:14 PM  Result Value Ref Range Status   WBC, UA 0-5 0 - 5 /hpf Final   RBC, UA 0-2 0 - 2 /hpf Final   Epithelial Cells (non renal) 0-10 0 - 10 /hpf Final   Bacteria, UA None seen None seen/Few Final    RADIOLOGY:  No results found.   Management plans discussed with the patient, his wife and they are in agreement.  CODE STATUS: Full Code   TOTAL TIME TAKING CARE OF  THIS PATIENT: 35 minutes.    [name] M.D on 08/25/2016 at 11:35 AM  Between 7am to 6pm - Pager - [phone]  After 6pm go to [email or web address] - [employer] [place] [facility]  Office  [phone]  CC: Primary care physician; FITZGERALD, DAVID Mamie Nick, MD   Note: This dictation was prepared with Dragon dictation along with smaller phrase technology. Any transcriptional errors that result from this process are unintentional.

## 2016-08-26 ENCOUNTER — Telehealth: Payer: Self-pay

## 2016-08-26 NOTE — Telephone Encounter (Signed)
-----   Message from Alisa Graff, [place] sent at 08/26/2016  8:54 AM EDT ----- Regarding: Please call Contact: [phone] Discharged 4/1

## 2016-09-04 NOTE — Telephone Encounter (Signed)
Spoke with patients wife and she states that they just saw the cardiologist and he has a lot going on right now. She states that she has our number and will call back if they wish to establish care once things settle down.

## 2016-09-23 DIAGNOSIS — R0602 Shortness of breath: Secondary | ICD-10-CM | POA: Insufficient documentation

## 2016-11-20 ENCOUNTER — Emergency Department
Admission: EM | Admit: 2016-11-20 | Discharge: 2016-11-20 | Disposition: A | Discharge status: Home / Self Care | Payer: Medicare Other | Attending: Emergency Medicine | Admitting: Emergency Medicine

## 2016-11-20 ENCOUNTER — Encounter: Payer: Self-pay | Admitting: Emergency Medicine

## 2016-11-20 DIAGNOSIS — Z79899 Other long term (current) drug therapy: Secondary | ICD-10-CM | POA: Insufficient documentation

## 2016-11-20 DIAGNOSIS — R1030 Lower abdominal pain, unspecified: Secondary | ICD-10-CM | POA: Diagnosis present

## 2016-11-20 DIAGNOSIS — I5023 Acute on chronic systolic (congestive) heart failure: Secondary | ICD-10-CM | POA: Diagnosis not present

## 2016-11-20 DIAGNOSIS — R339 Retention of urine, unspecified: Secondary | ICD-10-CM | POA: Diagnosis not present

## 2016-11-20 DIAGNOSIS — N183 Chronic kidney disease, stage 3 (moderate): Secondary | ICD-10-CM | POA: Insufficient documentation

## 2016-11-20 DIAGNOSIS — Z7901 Long term (current) use of anticoagulants: Secondary | ICD-10-CM | POA: Diagnosis not present

## 2016-11-20 DIAGNOSIS — Z87891 Personal history of nicotine dependence: Secondary | ICD-10-CM | POA: Diagnosis not present

## 2016-11-20 DIAGNOSIS — Z7982 Long term (current) use of aspirin: Secondary | ICD-10-CM | POA: Diagnosis not present

## 2016-11-20 DIAGNOSIS — R338 Other retention of urine: Secondary | ICD-10-CM

## 2016-11-20 LAB — BASIC METABOLIC PANEL
ANION GAP: 12 (ref 5–15)
BUN: 44 mg/dL — AB (ref 6–20)
CHLORIDE: 98 mmol/L — AB (ref 101–111)
CO2: 24 mmol/L (ref 22–32)
Calcium: 9.4 mg/dL (ref 8.9–10.3)
Creatinine, Ser: 2.28 mg/dL — ABNORMAL HIGH (ref 0.61–1.24)
GFR calc Af Amer: 28 mL/min — ABNORMAL LOW (ref >60)
GFR, EST NON AFRICAN AMERICAN: 24 mL/min — AB (ref >60)
Glucose, Bld: 119 mg/dL — ABNORMAL HIGH (ref 65–99)
POTASSIUM: 4.1 mmol/L (ref 3.5–5.1)
SODIUM: 134 mmol/L — AB (ref 135–145)

## 2016-11-20 LAB — URINALYSIS, COMPLETE (UACMP) WITH MICROSCOPIC
Bacteria, UA: NONE SEEN
Bilirubin Urine: NEGATIVE
Glucose, UA: NEGATIVE mg/dL
HGB URINE DIPSTICK: NEGATIVE
Ketones, ur: NEGATIVE mg/dL
LEUKOCYTES UA: NEGATIVE
Nitrite: NEGATIVE
Protein, ur: NEGATIVE mg/dL
SPECIFIC GRAVITY, URINE: 1.013 (ref 1.005–1.030)
SQUAMOUS EPITHELIAL / LPF: NONE SEEN
pH: 5 (ref 5.0–8.0)

## 2016-11-20 LAB — CBC
HCT: 42.5 % (ref 40.0–52.0)
Hemoglobin: 14.3 g/dL (ref 13.0–18.0)
MCH: 31.4 pg (ref 26.0–34.0)
MCHC: 33.6 g/dL (ref 32.0–36.0)
MCV: 93.4 fL (ref 80.0–100.0)
Platelets: 157 10*3/uL (ref 150–440)
RBC: 4.55 MIL/uL (ref 4.40–5.90)
RDW: 16.2 % — ABNORMAL HIGH (ref 11.5–14.5)
WBC: 10.6 10*3/uL (ref 3.8–10.6)

## 2016-11-20 NOTE — ED Triage Notes (Signed)
Patient presents to the ED with dysuria and urinary frequency with halting urination that began at 2am today.  Patient also reports diarrhea that started after using a suppository due to constipation.  Patient reports feeling very uncomfortable with difficulty urinating.

## 2016-11-20 NOTE — ED Notes (Signed)
Bladder scanned patient and results were greater than 56ml.  MD notified.  Will continue to monitor.

## 2016-11-20 NOTE — ED Notes (Signed)
Patient and family given information regarding home catheter care.  [name] and patient verbalized understanding.

## 2016-11-20 NOTE — ED Notes (Signed)
Hooked patient up to 2 liters of O2 and up to the monitor.

## 2016-11-20 NOTE — ED Provider Notes (Signed)
[facility] Emergency Department Provider Note   ____________________________________________    I have reviewed the triage vital signs and the nursing notes.   HISTORY  Chief Complaint Urinary Retention and Diarrhea     HPI Ruben Albornoz Sr. is a 83 y.o. male who presents with complaints of difficulty urinating. Patient reports he woke up this morning with pressure in his lower abdomen and unable to urinate. Patient reports at baseline he has a weak stream and has some difficulty urinating but has never not been able to before. He reports a history of BPH. No nausea or vomiting. Had an episode of loose stool but he attributes this to using a laxative. No fevers or chills.  Past Medical History:  Diagnosis Date  . Acute pulmonary edema ([place]) 02/26/2013  . Benign prostatic hyperplasia with urinary obstruction 02/24/2012  . BP (high blood pressure) 10/31/2015  . Cataract extraction status of left eye   . Cataract extraction status of right eye   . Cerebrovascular accident (CVA) ([place]) 04/24/2012  . Chronic kidney disease 02/16/2014   stage 2  . Congestive heart failure ([place]) 10/31/2015  . HLD (hyperlipidemia) 10/31/2015  . Hydrocele 02/25/2012  . Unstable angina pectoris ([place]) 02/26/2013  . Urge incontinence of urine 05/30/2014  . Urinary urgency 10/31/2015    Patient Active Problem List   Diagnosis Date Noted  . Acute on chronic systolic CHF (congestive heart failure) ([place]) 08/23/2016  . CKD (chronic kidney disease), stage III 08/23/2016  . Elevated troponin 08/23/2016  . Thrombocytopenia ([place]) 08/23/2016  . Hyperkalemia 11/06/2015  . Urinary urgency 10/31/2015  . Chronic kidney disease, stage 3 10/31/2015  . Congestive heart failure ([place]) 10/31/2015  . Hyperlipidemia 10/31/2015  . BP (high blood pressure) 10/31/2015  . Cerebrovascular accident (CVA) ([place]) 10/31/2015  . Phimosis 10/31/2015  . 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 Benign  fibroma of prostate 05/30/2014  . Urge incontinence of urine 05/30/2014  . Chronic kidney disease 02/16/2014  . Acute pulmonary edema ([place]) 02/26/2013  . Unstable angina pectoris ([place]) 02/26/2013  . Hydrocele 02/25/2012  . Benign prostatic hyperplasia with urinary obstruction 02/24/2012    Past Surgical History:  Procedure Laterality Date  . ANKLE FRACTURE SURGERY Right   . CIRCUMCISION N/A 11/13/2015   Procedure: CIRCUMCISION ADULT;  Surgeon: Hollice Espy, MD;  Location: [facility] ORS;  Service: Urology;  Laterality: N/A;  . EYE SURGERY    . HERNIA REPAIR    . HYDROCELE EXCISION Bilateral 11/13/2015   Procedure: HYDROCELECTOMY ADULT;  Surgeon: Hollice Espy, MD;  Location: [facility] ORS;  Service: Urology;  Laterality: Bilateral;  . TONSILLECTOMY  1950's  . TONSILLECTOMY    . UMBILICAL HERNIA REPAIR    . VARICOCELECTOMY  1948  . VASECTOMY      Prior to Admission medications   Medication Sig Start Date End Date Taking? Authorizing Provider  ALPRAZolam Duanne Moron) 0.25 MG tablet Take 0.25 mg by mouth at bedtime as needed for sleep. 08/19/16   [provider]  apixaban (ELIQUIS) 2.5 MG TABS tablet Take 1 tablet (2.5 mg total) by mouth 2 (two) times daily. 08/25/16   [name], MD  aspirin EC 81 MG EC tablet Take 1 tablet (81 mg total) by mouth daily. 08/25/16   [name], MD  atorvastatin (LIPITOR) 10 MG tablet Take 10 mg by mouth. In am.    [provider]  betamethasone dipropionate (DIPROLENE) 0.05 % cream Apply topically 2 (two) times daily. Patient not taking: Reported  on 02/15/2016 11/29/15   Zara Council A, PA-C  betamethasone dipropionate (DIPROLENE) 0.05 % cream Apply topically 2 (two) times daily. Patient not taking: Reported on 08/23/2016 02/15/16   Zara Council A, PA-C  calcium-vitamin D (CALCIUM 500/D) 500-200 MG-UNIT tablet Take 1 tablet by mouth daily with breakfast.     [provider]  carvedilol (COREG) 3.125 MG tablet Take 3.125 mg by mouth 2 (two)  times daily with a meal. 08/19/16   [provider]  docusate sodium (COLACE) 100 MG capsule Take 1 capsule (100 mg total) by mouth 2 (two) times daily. Patient not taking: Reported on 02/15/2016 11/13/15   Hollice Espy, MD  furosemide (LASIX) 20 MG tablet Take 1 tablet (20 mg total) by mouth 2 (two) times daily. 08/25/16   [name], MD  lisinopril (PRINIVIL,ZESTRIL) 5 MG tablet Take 5 mg by mouth daily. In am.     [provider]  Multiple Vitamin (MULTI-VITAMINS) TABS Take by mouth. In am.    [provider]  tamsulosin (FLOMAX) 0.4 MG CAPS capsule Take 0.4 mg by mouth. In am.    [provider]     Allergies Patient has no known allergies.  Family History  Problem Relation Age of Onset  . Kidney cancer Neg Hx   . Prostate cancer Neg Hx     Social History Social History  Substance Use Topics  . Smoking status: Former Smoker    Quit date: 11/03/1954  . Smokeless tobacco: Never Used  . Alcohol use 4.8 oz/week    7 Shots of liquor, 1 Standard drinks or equivalent per week     Comment: 1 shot of gin a day.    Review of Systems  Constitutional: No fever/chills Eyes: No visual changes.  ENT: No sore throat. Cardiovascular: Denies chest pain. Respiratory: Denies shortness of breath. Gastrointestinal: No abdominal pain.  No nausea, no vomiting.   Genitourinary: As above Musculoskeletal: Negative for back pain. Skin: Negative for rash. Neurological: Negative for headaches or weakness   ____________________________________________   PHYSICAL EXAM:  VITAL SIGNS: ED Triage Vitals  Enc Vitals Group     BP 11/20/16 1158 107/71     Pulse Rate 11/20/16 1158 (!) 133     Resp 11/20/16 1158 18     Temp 11/20/16 1158 98.4 F (36.9 C)     Temp Source 11/20/16 1158 Axillary     SpO2 11/20/16 1158 97 %     Weight 11/20/16 1202 68.8 kg (151 lb 9.6 oz)     Height 11/20/16 1202 1.753 m (5\' 9" )     Head Circumference --      Peak Flow --       Pain Score 11/20/16 1157 5     Pain Loc --      Pain Edu? --      Excl. in [place]? --     Constitutional: Alert and oriented. No acute distress. Pleasant and interactive  Nose: No congestion/rhinnorhea. Mouth/Throat: Mucous membranes are moist.    Cardiovascular: Irregular rhythm. Grossly normal heart sounds.  Good peripheral circulation. Respiratory: Normal respiratory effort.  No retractions. Lungs CTAB. Gastrointestinal: Soft and nontender. No distention.  No CVA tenderness. Genitourinary: deferred Musculoskeletal: No lower extremity tenderness nor edema.  Warm and well perfused Neurologic:  Normal speech and language. No gross focal neurologic deficits are appreciated.  Skin:  Skin is warm, dry and intact. No rash noted. Psychiatric: Mood and affect are normal. Speech and behavior are normal.  ____________________________________________  LABS (all labs ordered are listed, but only abnormal results are displayed)  Labs Reviewed  URINALYSIS, COMPLETE (UACMP) WITH MICROSCOPIC - Abnormal; Notable for the following:       Result Value   Color, Urine YELLOW (*)    APPearance HAZY (*)    All other components within normal limits  BASIC METABOLIC PANEL - Abnormal; Notable for the following:    Sodium 134 (*)    Chloride 98 (*)    Glucose, Bld 119 (*)    BUN 44 (*)    Creatinine, Ser 2.28 (*)    GFR calc non Af Amer 24 (*)    GFR calc Af Amer 28 (*)    All other components within normal limits  CBC - Abnormal; Notable for the following:    RDW 16.2 (*)    All other components within normal limits   ____________________________________________  EKG  None ____________________________________________  RADIOLOGY  None ____________________________________________   PROCEDURES  Procedure(s) performed: No    Critical Care performed: No ____________________________________________   INITIAL IMPRESSION / ASSESSMENT AND PLAN / ED COURSE  Pertinent labs & imaging  results that were available during my care of the patient were reviewed by me and considered in my medical decision making (see chart for details).  C scan shows greater than 500 cc in bladder, patient unable to urinate. We will place Foley catheter, check urinalysis and kidney function  Kidney function is not significantly changed from prior. Patient had relief with Foley insertion. Patient trained on how to use leg bag by RN, he will follow up with urology next week. No evidence of infection on urinalysis    ____________________________________________   FINAL CLINICAL IMPRESSION(S) / ED DIAGNOSES  Final diagnoses:  Acute urinary retention      NEW MEDICATIONS STARTED DURING THIS VISIT:  New Prescriptions   No medications on file     Note:  This document was prepared using Dragon voice recognition software and may include unintentional dictation errors.    Lavonia Drafts, MD 11/20/16 1430

## 2016-11-22 ENCOUNTER — Telehealth: Payer: Self-pay | Admitting: Urology

## 2016-11-22 NOTE — Telephone Encounter (Signed)
Pt was in the ER last weekend and has a foley.  His wife called and wanted to schedule an appt next week for a foley removal.  Please advise.

## 2016-11-22 NOTE — Telephone Encounter (Signed)
Spoke to spouse. Advised pt would need to put on provider's schedule since last OV was 02/15/2016.

## 2016-11-28 ENCOUNTER — Ambulatory Visit (INDEPENDENT_AMBULATORY_CARE_PROVIDER_SITE_OTHER): Payer: Medicare Other | Admitting: Urology

## 2016-11-28 ENCOUNTER — Encounter: Payer: Self-pay | Admitting: Urology

## 2016-11-28 VITALS — BP 82/55 | HR 118 | Ht 69.0 in | Wt 155.0 lb

## 2016-11-28 DIAGNOSIS — R338 Other retention of urine: Secondary | ICD-10-CM | POA: Diagnosis not present

## 2016-11-28 DIAGNOSIS — N401 Enlarged prostate with lower urinary tract symptoms: Secondary | ICD-10-CM | POA: Diagnosis not present

## 2016-11-28 LAB — BLADDER SCAN AMB NON-IMAGING: SCAN RESULT: 45

## 2016-11-28 NOTE — Progress Notes (Signed)
Catheter Removal  Patient is present today for a catheter removal.  68ml of water was drained from the balloon. A 16FR foley cath was removed from the bladder no complications were noted . Patient tolerated well.  Preformed by: Elberta Leatherwood, CMA  Follow up/ Additional notes: Return this afternoon for PVR

## 2016-11-28 NOTE — Progress Notes (Signed)
11/28/2016 11:41 AM   Ruben Pandy Sr. 10-01-1925 [id number]  Referring provider: Leonel Ramsay, MD [place],  [id number]  Chief Complaint  Patient presents with  . Urinary Retention    HPI: The patient is an 83 year old gentleman with a past medical history of BPH on tamsulosin who presents today for follow-up after having a Foley catheter placed for urinary retention for a volume of 500 cc. He notes that initially he presented to the emergency department because he had a strain to urinate and it was uncomfortable. Only small amounts would come out. She felt like his bladder was also full. He has never had this problem prior to this event. He notes that at baseline he has minimal nocturia with a good urinary stream. He feels that he empties his bladder. He has been on Flomax for many years. His dose was increased to 0.8 mg at the time that he developed acute urinary retention. He has no previous history of urinary retention.  An attempt was made in the office to instill fluid in his bladder and have the patient undergo a trial of void. However, he had a bladder spasm and was unable to have a true fill and pull study to ensure he is now at able to empty his bladder.      PMH: Past Medical History:  Diagnosis Date  . Acute pulmonary edema ([place]) 02/26/2013  . Benign prostatic hyperplasia with urinary obstruction 02/24/2012  . BP (high blood pressure) 10/31/2015  . Cataract extraction status of left eye   . Cataract extraction status of right eye   . Cerebrovascular accident (CVA) ([place]) 04/24/2012  . Chronic kidney disease 02/16/2014   stage 2  . Congestive heart failure ([place]) 10/31/2015  . HLD (hyperlipidemia) 10/31/2015  . Hydrocele 02/25/2012  . Unstable angina pectoris ([place]) 02/26/2013  . Urge incontinence of urine 05/30/2014  . Urinary urgency 10/31/2015    Surgical History: Past Surgical History:  Procedure Laterality Date  . ANKLE FRACTURE SURGERY Right    . CIRCUMCISION N/A 11/13/2015   Procedure: CIRCUMCISION ADULT;  Surgeon: Hollice Espy, MD;  Location: [facility] ORS;  Service: Urology;  Laterality: N/A;  . EYE SURGERY    . HERNIA REPAIR    . HYDROCELE EXCISION Bilateral 11/13/2015   Procedure: HYDROCELECTOMY ADULT;  Surgeon: Hollice Espy, MD;  Location: [facility] ORS;  Service: Urology;  Laterality: Bilateral;  . TONSILLECTOMY  1950's  . TONSILLECTOMY    . UMBILICAL HERNIA REPAIR    . VARICOCELECTOMY  1948  . VASECTOMY      Home Medications:  Allergies as of 11/28/2016   No Known Allergies     Medication List       Accurate as of 11/28/16 11:41 AM. Always use your most recent med list.          ALPRAZolam 0.25 MG tablet Commonly known as:  XANAX Take 0.25 mg by mouth at bedtime as needed for sleep.   apixaban 2.5 MG Tabs tablet Commonly known as:  ELIQUIS Take 1 tablet (2.5 mg total) by mouth 2 (two) times daily.   aspirin 81 MG EC tablet Take 1 tablet (81 mg total) by mouth daily.   atorvastatin 10 MG tablet Commonly known as:  LIPITOR Take 10 mg by mouth. In am.   betamethasone dipropionate 0.05 % cream Commonly known as:  DIPROLENE Apply topically 2 (two) times daily.   betamethasone dipropionate 0.05 % cream Commonly known as:  DIPROLENE Apply topically 2 (two)  times daily.   CALCIUM 500/D 500-200 MG-UNIT tablet Generic drug:  calcium-vitamin D Take 1 tablet by mouth daily with breakfast.   carvedilol 3.125 MG tablet Commonly known as:  COREG Take 3.125 mg by mouth 2 (two) times daily with a meal.   docusate sodium 100 MG capsule Commonly known as:  COLACE Take 1 capsule (100 mg total) by mouth 2 (two) times daily.   furosemide 20 MG tablet Commonly known as:  LASIX Take 1 tablet (20 mg total) by mouth 2 (two) times daily.   lisinopril 5 MG tablet Commonly known as:  PRINIVIL,ZESTRIL Take 5 mg by mouth daily. In am.   MULTI-VITAMINS Tabs Take by mouth. In am.   tamsulosin 0.4 MG Caps  capsule Commonly known as:  FLOMAX Take 0.4 mg by mouth. In am.       Allergies: No Known Allergies  Family History: Family History  Problem Relation Age of Onset  . Kidney cancer Neg Hx   . Prostate cancer Neg Hx     Social History:  reports that he quit smoking about 62 years ago. He has never used smokeless tobacco. He reports that he drinks about 4.8 oz of alcohol per week . He reports that he does not use drugs.  ROS: UROLOGY Frequent Urination?: Yes Hard to postpone urination?: Yes Burning/pain with urination?: Yes Get up at night to urinate?: Yes Leakage of urine?: Yes Urine stream starts and stops?: No Trouble starting stream?: No Do you have to strain to urinate?: No Blood in urine?: No Urinary tract infection?: No Sexually transmitted disease?: No Injury to kidneys or bladder?: No Painful intercourse?: No Weak stream?: No Erection problems?: No Penile pain?: No  Gastrointestinal Nausea?: No Vomiting?: No Indigestion/heartburn?: No Diarrhea?: No Constipation?: No  Constitutional Fever: No Night sweats?: No Weight loss?: No Fatigue?: No  Skin Skin rash/lesions?: No Itching?: No  Eyes Blurred vision?: No Double vision?: No  Ears/Nose/Throat Sore throat?: No Sinus problems?: No  Hematologic/Lymphatic Swollen glands?: No Easy bruising?: No  Cardiovascular Leg swelling?: No Chest pain?: No  Respiratory Cough?: No Shortness of breath?: No  Endocrine Excessive thirst?: No  Musculoskeletal Back pain?: No Joint pain?: No  Neurological Headaches?: No Dizziness?: No  Psychologic Depression?: No Anxiety?: No  Physical Exam: BP (!) 82/55 (BP Location: Left Arm, Patient Position: Sitting, Cuff Size: Normal)   Pulse (!) 118   Ht 5\' 9"  (1.753 m)   Wt 155 lb (70.3 kg)   BMI 22.89 kg/m   Constitutional:  Alert and oriented, No acute distress. HEENT: [place] AT, moist mucus membranes.  Trachea midline, no masses. Cardiovascular: No  clubbing, cyanosis, or edema. Respiratory: Normal respiratory effort, no increased work of breathing. GI: Abdomen is soft, nontender, nondistended, no abdominal masses GU: No CVA tenderness.  Skin: No rashes, bruises or suspicious lesions. Lymph: No cervical or inguinal adenopathy. Neurologic: Grossly intact, no focal deficits, moving all 4 extremities. Psychiatric: Normal mood and affect.  Laboratory Data: Lab Results  Component Value Date   WBC 10.6 11/20/2016   HGB 14.3 11/20/2016   HCT 42.5 11/20/2016   MCV 93.4 11/20/2016   PLT 157 11/20/2016    Lab Results  Component Value Date   CREATININE 2.28 (H) 11/20/2016    No results found for: PSA  No results found for: TESTOSTERONE  Lab Results  Component Value Date   HGBA1C 5.7 (H) 08/23/2016    Urinalysis    Component Value Date/Time   COLORURINE YELLOW (A) 11/20/2016 1207  APPEARANCEUR HAZY (A) 11/20/2016 1207   APPEARANCEUR Clear 11/22/2015 1514   LABSPEC 1.013 11/20/2016 1207   LABSPEC 1.008 05/28/2012 1120   PHURINE 5.0 11/20/2016 1207   GLUCOSEU NEGATIVE 11/20/2016 1207   GLUCOSEU Negative 05/28/2012 1120   HGBUR NEGATIVE 11/20/2016 [place] 11/20/2016 1207   BILIRUBINUR Negative 11/22/2015 1514   BILIRUBINUR Negative 05/28/2012 1120   [place] 11/20/2016 [place] 11/20/2016 1207   NITRITE NEGATIVE 11/20/2016 [place] 11/20/2016 1207   LEUKOCYTESUR Negative 11/22/2015 1514   LEUKOCYTESUR Negative 05/28/2012 1120     Assessment & Plan:    1. BPH The patient will return this afternoon for a PVR. He will continue his Flomax 0.8 mg daily. Assuming his PVR is normal, he can follow up with [place] annually.  Nickie Retort, MD  [facility] [address], [place], [place] [address] [phone]

## 2016-12-23 ENCOUNTER — Telehealth: Payer: Self-pay | Admitting: Urology

## 2016-12-23 NOTE — Telephone Encounter (Signed)
Pt was seen by Pilar Jarvis and took catheter on 7/5.  Pt had blood in urine this past Saturday.  He still has some blood in urine.  Please call pt's spouse, Vickii Chafe.  [phone]

## 2016-12-25 NOTE — Telephone Encounter (Signed)
Spoke with patient's wife and she states patient passes random small clots at times and is having no other urinary symptoms. Denies dysuria, frequency, fever. She was told that he should increase water intake and if symptoms persist or worsen to call the office and schedule a nurse visit urine drop off. [name] verbalized agreement with this plan

## 2017-03-09 ENCOUNTER — Inpatient Hospital Stay
Admission: EM | Admit: 2017-03-09 | Discharge: 2017-03-20 | DRG: 393 | Disposition: A | Discharge status: Home Health | Payer: Medicare Other | Attending: Specialist | Admitting: Specialist

## 2017-03-09 ENCOUNTER — Encounter: Payer: Self-pay | Admitting: *Deleted

## 2017-03-09 ENCOUNTER — Emergency Department: Payer: Medicare Other

## 2017-03-09 ENCOUNTER — Inpatient Hospital Stay: Payer: Medicare Other

## 2017-03-09 DIAGNOSIS — Z7189 Other specified counseling: Secondary | ICD-10-CM | POA: Diagnosis not present

## 2017-03-09 DIAGNOSIS — N184 Chronic kidney disease, stage 4 (severe): Secondary | ICD-10-CM | POA: Diagnosis present

## 2017-03-09 DIAGNOSIS — N186 End stage renal disease: Secondary | ICD-10-CM | POA: Diagnosis not present

## 2017-03-09 DIAGNOSIS — Z515 Encounter for palliative care: Secondary | ICD-10-CM | POA: Diagnosis present

## 2017-03-09 DIAGNOSIS — I4891 Unspecified atrial fibrillation: Secondary | ICD-10-CM | POA: Diagnosis not present

## 2017-03-09 DIAGNOSIS — N139 Obstructive and reflux uropathy, unspecified: Secondary | ICD-10-CM

## 2017-03-09 DIAGNOSIS — J9611 Chronic respiratory failure with hypoxia: Secondary | ICD-10-CM | POA: Diagnosis present

## 2017-03-09 DIAGNOSIS — E785 Hyperlipidemia, unspecified: Secondary | ICD-10-CM | POA: Diagnosis present

## 2017-03-09 DIAGNOSIS — I5023 Acute on chronic systolic (congestive) heart failure: Secondary | ICD-10-CM | POA: Diagnosis present

## 2017-03-09 DIAGNOSIS — I447 Left bundle-branch block, unspecified: Secondary | ICD-10-CM | POA: Diagnosis present

## 2017-03-09 DIAGNOSIS — I480 Paroxysmal atrial fibrillation: Secondary | ICD-10-CM | POA: Diagnosis present

## 2017-03-09 DIAGNOSIS — I429 Cardiomyopathy, unspecified: Secondary | ICD-10-CM | POA: Diagnosis present

## 2017-03-09 DIAGNOSIS — Z992 Dependence on renal dialysis: Secondary | ICD-10-CM

## 2017-03-09 DIAGNOSIS — J9690 Respiratory failure, unspecified, unspecified whether with hypoxia or hypercapnia: Secondary | ICD-10-CM

## 2017-03-09 DIAGNOSIS — Z9981 Dependence on supplemental oxygen: Secondary | ICD-10-CM

## 2017-03-09 DIAGNOSIS — Z7901 Long term (current) use of anticoagulants: Secondary | ICD-10-CM

## 2017-03-09 DIAGNOSIS — I679 Cerebrovascular disease, unspecified: Secondary | ICD-10-CM | POA: Diagnosis present

## 2017-03-09 DIAGNOSIS — K420 Umbilical hernia with obstruction, without gangrene: Principal | ICD-10-CM | POA: Diagnosis present

## 2017-03-09 DIAGNOSIS — N170 Acute kidney failure with tubular necrosis: Secondary | ICD-10-CM | POA: Diagnosis present

## 2017-03-09 DIAGNOSIS — N183 Chronic kidney disease, stage 3 unspecified: Secondary | ICD-10-CM | POA: Diagnosis present

## 2017-03-09 DIAGNOSIS — K56609 Unspecified intestinal obstruction, unspecified as to partial versus complete obstruction: Secondary | ICD-10-CM | POA: Diagnosis present

## 2017-03-09 DIAGNOSIS — I130 Hypertensive heart and chronic kidney disease with heart failure and stage 1 through stage 4 chronic kidney disease, or unspecified chronic kidney disease: Secondary | ICD-10-CM | POA: Diagnosis present

## 2017-03-09 DIAGNOSIS — N179 Acute kidney failure, unspecified: Secondary | ICD-10-CM

## 2017-03-09 DIAGNOSIS — Z87891 Personal history of nicotine dependence: Secondary | ICD-10-CM

## 2017-03-09 DIAGNOSIS — E8770 Fluid overload, unspecified: Secondary | ICD-10-CM

## 2017-03-09 DIAGNOSIS — R3915 Urgency of urination: Secondary | ICD-10-CM | POA: Diagnosis present

## 2017-03-09 DIAGNOSIS — R579 Shock, unspecified: Secondary | ICD-10-CM | POA: Diagnosis present

## 2017-03-09 DIAGNOSIS — R601 Generalized edema: Secondary | ICD-10-CM | POA: Diagnosis not present

## 2017-03-09 DIAGNOSIS — E43 Unspecified severe protein-calorie malnutrition: Secondary | ICD-10-CM | POA: Diagnosis not present

## 2017-03-09 DIAGNOSIS — F419 Anxiety disorder, unspecified: Secondary | ICD-10-CM | POA: Diagnosis present

## 2017-03-09 DIAGNOSIS — Z66 Do not resuscitate: Secondary | ICD-10-CM | POA: Diagnosis present

## 2017-03-09 DIAGNOSIS — R748 Abnormal levels of other serum enzymes: Secondary | ICD-10-CM | POA: Diagnosis present

## 2017-03-09 DIAGNOSIS — Z4659 Encounter for fitting and adjustment of other gastrointestinal appliance and device: Secondary | ICD-10-CM

## 2017-03-09 DIAGNOSIS — Z7982 Long term (current) use of aspirin: Secondary | ICD-10-CM

## 2017-03-09 DIAGNOSIS — R040 Epistaxis: Secondary | ICD-10-CM | POA: Diagnosis not present

## 2017-03-09 DIAGNOSIS — R0902 Hypoxemia: Secondary | ICD-10-CM

## 2017-03-09 DIAGNOSIS — N401 Enlarged prostate with lower urinary tract symptoms: Secondary | ICD-10-CM | POA: Diagnosis present

## 2017-03-09 DIAGNOSIS — Z8673 Personal history of transient ischemic attack (TIA), and cerebral infarction without residual deficits: Secondary | ICD-10-CM

## 2017-03-09 DIAGNOSIS — I482 Chronic atrial fibrillation: Secondary | ICD-10-CM | POA: Diagnosis not present

## 2017-03-09 DIAGNOSIS — R410 Disorientation, unspecified: Secondary | ICD-10-CM | POA: Diagnosis not present

## 2017-03-09 DIAGNOSIS — I959 Hypotension, unspecified: Secondary | ICD-10-CM

## 2017-03-09 LAB — URINALYSIS, COMPLETE (UACMP) WITH MICROSCOPIC
BILIRUBIN URINE: NEGATIVE
GLUCOSE, UA: NEGATIVE mg/dL
KETONES UR: NEGATIVE mg/dL
LEUKOCYTES UA: NEGATIVE
Nitrite: NEGATIVE
PH: 5 (ref 5.0–8.0)
Protein, ur: 30 mg/dL — AB
SPECIFIC GRAVITY, URINE: 1.014 (ref 1.005–1.030)
SQUAMOUS EPITHELIAL / LPF: NONE SEEN

## 2017-03-09 LAB — PROTIME-INR
INR: 1.29
Prothrombin Time: 16 seconds — ABNORMAL HIGH (ref 11.4–15.2)

## 2017-03-09 LAB — BLOOD GAS, ARTERIAL
ACID-BASE DEFICIT: 0.6 mmol/L (ref 0.0–2.0)
Bicarbonate: 24.9 mmol/L (ref 20.0–28.0)
FIO2: 0.28
O2 SAT: 96.3 %
Patient temperature: 37
pCO2 arterial: 43 mmHg (ref 32.0–48.0)
pH, Arterial: 7.37 (ref 7.350–7.450)
pO2, Arterial: 87 mmHg (ref 83.0–108.0)

## 2017-03-09 LAB — COMPREHENSIVE METABOLIC PANEL
ALK PHOS: 105 U/L (ref 38–126)
ALT: 23 U/L (ref 17–63)
AST: 38 U/L (ref 15–41)
Albumin: 4.1 g/dL (ref 3.5–5.0)
Anion gap: 15 (ref 5–15)
BILIRUBIN TOTAL: 2.7 mg/dL — AB (ref 0.3–1.2)
BUN: 56 mg/dL — AB (ref 6–20)
CALCIUM: 9.9 mg/dL (ref 8.9–10.3)
CHLORIDE: 100 mmol/L — AB (ref 101–111)
CO2: 26 mmol/L (ref 22–32)
CREATININE: 2.59 mg/dL — AB (ref 0.61–1.24)
GFR, EST AFRICAN AMERICAN: 24 mL/min — AB (ref >60)
GFR, EST NON AFRICAN AMERICAN: 20 mL/min — AB (ref >60)
Glucose, Bld: 141 mg/dL — ABNORMAL HIGH (ref 65–99)
Potassium: 4.6 mmol/L (ref 3.5–5.1)
Sodium: 141 mmol/L (ref 135–145)
TOTAL PROTEIN: 8.3 g/dL — AB (ref 6.5–8.1)

## 2017-03-09 LAB — CBC
HCT: 41.9 % (ref 40.0–52.0)
Hemoglobin: 13.8 g/dL (ref 13.0–18.0)
MCH: 31.7 pg (ref 26.0–34.0)
MCHC: 32.9 g/dL (ref 32.0–36.0)
MCV: 96.5 fL (ref 80.0–100.0)
Platelets: 140 10*3/uL — ABNORMAL LOW (ref 150–440)
RBC: 4.35 MIL/uL — AB (ref 4.40–5.90)
RDW: 16.3 % — AB (ref 11.5–14.5)
WBC: 6.4 10*3/uL (ref 3.8–10.6)

## 2017-03-09 LAB — LIPASE, BLOOD: Lipase: 37 U/L (ref 11–51)

## 2017-03-09 LAB — TYPE AND SCREEN
ABO/RH(D): A POS
ANTIBODY SCREEN: NEGATIVE

## 2017-03-09 LAB — LACTIC ACID, PLASMA
LACTIC ACID, VENOUS: 3 mmol/L — AB (ref 0.5–1.9)
LACTIC ACID, VENOUS: 3.3 mmol/L — AB (ref 0.5–1.9)

## 2017-03-09 LAB — MRSA PCR SCREENING: MRSA by PCR: NEGATIVE

## 2017-03-09 LAB — GLUCOSE, CAPILLARY: GLUCOSE-CAPILLARY: 146 mg/dL — AB (ref 65–99)

## 2017-03-09 LAB — TROPONIN I: Troponin I: 0.05 ng/mL (ref <0.03)

## 2017-03-09 LAB — PHOSPHORUS: PHOSPHORUS: 6 mg/dL — AB (ref 2.5–4.6)

## 2017-03-09 LAB — MAGNESIUM: Magnesium: 2.6 mg/dL — ABNORMAL HIGH (ref 1.7–2.4)

## 2017-03-09 LAB — BRAIN NATRIURETIC PEPTIDE: B NATRIURETIC PEPTIDE 5: 3413 pg/mL — AB (ref 0.0–100.0)

## 2017-03-09 LAB — APTT: APTT: 48 s — AB (ref 24–36)

## 2017-03-09 MED ORDER — HEPARIN SODIUM (PORCINE) 5000 UNIT/ML IJ SOLN
5000.0000 [IU] | Freq: Three times a day (TID) | INTRAMUSCULAR | Status: DC
  Administered 2017-03-09 – 2017-03-13 (×12): 5000 [IU] via SUBCUTANEOUS
  Filled 2017-03-09 (×12): qty 1

## 2017-03-09 MED ORDER — FENTANYL CITRATE (PF) 100 MCG/2ML IJ SOLN
12.5000 ug | Freq: Once | INTRAMUSCULAR | Status: AC
  Administered 2017-03-09: 12.5 ug via INTRAVENOUS
  Filled 2017-03-09: qty 2

## 2017-03-09 MED ORDER — FENTANYL CITRATE (PF) 100 MCG/2ML IJ SOLN: 25.0000 ug | INTRAMUSCULAR | Status: DC | PRN

## 2017-03-09 MED ORDER — BENZOCAINE 20 % MT SOLN
OROMUCOSAL | Status: AC
  Filled 2017-03-09: qty 5

## 2017-03-09 MED ORDER — ONDANSETRON HCL 4 MG PO TABS: 4.0000 mg | ORAL_TABLET | Freq: Four times a day (QID) | ORAL | Status: DC | PRN

## 2017-03-09 MED ORDER — AMIODARONE HCL IN DEXTROSE 360-4.14 MG/200ML-% IV SOLN
60.0000 mg/h | INTRAVENOUS | Status: AC
  Administered 2017-03-09 (×2): 60 mg/h via INTRAVENOUS
  Filled 2017-03-09 (×2): qty 200

## 2017-03-09 MED ORDER — CARVEDILOL 3.125 MG PO TABS
3.1250 mg | ORAL_TABLET | Freq: Two times a day (BID) | ORAL | Status: DC
  Administered 2017-03-10: 3.125 mg via ORAL
  Filled 2017-03-09 (×2): qty 1

## 2017-03-09 MED ORDER — ONDANSETRON HCL 4 MG/2ML IJ SOLN
4.0000 mg | Freq: Once | INTRAMUSCULAR | Status: AC
  Administered 2017-03-09: 4 mg via INTRAVENOUS
  Filled 2017-03-09: qty 2

## 2017-03-09 MED ORDER — FUROSEMIDE 10 MG/ML IJ SOLN
20.0000 mg | Freq: Once | INTRAMUSCULAR | Status: AC
  Administered 2017-03-09: 20 mg via INTRAVENOUS
  Filled 2017-03-09: qty 4

## 2017-03-09 MED ORDER — PANTOPRAZOLE SODIUM 40 MG IV SOLR
40.0000 mg | INTRAVENOUS | Status: DC
  Administered 2017-03-09: 40 mg via INTRAVENOUS
  Filled 2017-03-09: qty 40

## 2017-03-09 MED ORDER — TAMSULOSIN HCL 0.4 MG PO CAPS
0.4000 mg | ORAL_CAPSULE | Freq: Every day | ORAL | Status: DC
  Administered 2017-03-10 – 2017-03-12 (×3): 0.4 mg via ORAL
  Filled 2017-03-09 (×4): qty 1

## 2017-03-09 MED ORDER — FUROSEMIDE 10 MG/ML IJ SOLN
40.0000 mg | Freq: Two times a day (BID) | INTRAMUSCULAR | Status: DC
  Filled 2017-03-09: qty 4

## 2017-03-09 MED ORDER — AMIODARONE HCL IN DEXTROSE 360-4.14 MG/200ML-% IV SOLN
30.0000 mg/h | INTRAVENOUS | Status: DC
  Administered 2017-03-10 – 2017-03-12 (×5): 30 mg/h via INTRAVENOUS
  Filled 2017-03-09 (×5): qty 200

## 2017-03-09 MED ORDER — AMIODARONE LOAD VIA INFUSION
150.0000 mg | Freq: Once | INTRAVENOUS | Status: AC
  Administered 2017-03-09: 150 mg via INTRAVENOUS
  Filled 2017-03-09: qty 83.34

## 2017-03-09 MED ORDER — ONDANSETRON HCL 4 MG/2ML IJ SOLN
4.0000 mg | Freq: Four times a day (QID) | INTRAMUSCULAR | Status: DC | PRN
  Administered 2017-03-17 – 2017-03-18 (×2): 4 mg via INTRAVENOUS
  Filled 2017-03-09 (×2): qty 2

## 2017-03-09 NOTE — ED Notes (Signed)
2nd surgeon at bedside

## 2017-03-09 NOTE — ED Notes (Signed)
After multiple attempts by 3 different nurses, NGT was replaced to right nare; pt tolerated all very well

## 2017-03-09 NOTE — ED Provider Notes (Signed)
[facility] Emergency Department Provider Note  ____________________________________________   First MD Initiated Contact with Patient 03/09/17 1658     (approximate)  I have reviewed the triage vital signs and the nursing notes.   HISTORY  Chief Complaint Abdominal Pain   HPI Ruben Schrieber Sr. is a 83 y.o. male here for evaluation of abdominal pain. Reports he began having severe abdominal pain this morning, associated with vomiting up some coffee ground-like stuff. He also reports that he's been slightly short of breath for a few days, he's had increase his Lasix.  Of note he is on eliquis. Has a history of A. fib. He reports the reason he came is not for the shortness of breath which has been relatively stable, but rather for significantly increasing abdominal pain with vomiting. He is also noticed his abdomen feels swollen  reports pain is 10 out of 10 in the mid-upper abdomen. Particular around the bellybutton area.   Past Medical History:  Diagnosis Date  . Acute pulmonary edema ([place]) 02/26/2013  . Benign prostatic hyperplasia with urinary obstruction 02/24/2012  . BP (high blood pressure) 10/31/2015  . Cataract extraction status of left eye   . Cataract extraction status of right eye   . Cerebrovascular accident (CVA) ([place]) 04/24/2012  . Chronic kidney disease 02/16/2014   stage 2  . Congestive heart failure ([place]) 10/31/2015  . HLD (hyperlipidemia) 10/31/2015  . Hydrocele 02/25/2012  . Unstable angina pectoris ([place]) 02/26/2013  . Urge incontinence of urine 05/30/2014  . Urinary urgency 10/31/2015    Patient Active Problem List   Diagnosis Date Noted  . Atrial fibrillation with RVR ([place]) 03/09/2017  . Recurrent umbilical hernia with incarceration   . Small bowel obstruction ([place])   . Acute on chronic systolic CHF (congestive heart failure) ([place]) 08/23/2016  . CKD (chronic kidney disease), stage III ([place]) 08/23/2016  . Elevated troponin 08/23/2016  .  Thrombocytopenia ([place]) 08/23/2016  . Hyperkalemia 11/06/2015  . Urinary urgency 10/31/2015  . Chronic kidney disease, stage 3 ([place]) 10/31/2015  . Congestive heart failure ([place]) 10/31/2015  . Hyperlipidemia 10/31/2015  . BP (high blood pressure) 10/31/2015  . Cerebrovascular accident (CVA) ([place]) 10/31/2015  . Phimosis 10/31/2015  . 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 Benign fibroma of prostate 05/30/2014  . Urge incontinence of urine 05/30/2014  . Chronic kidney disease 02/16/2014  . Acute pulmonary edema ([place]) 02/26/2013  . Unstable angina pectoris ([place]) 02/26/2013  . Hydrocele 02/25/2012  . Benign prostatic hyperplasia with urinary obstruction 02/24/2012    Past Surgical History:  Procedure Laterality Date  . ANKLE FRACTURE SURGERY Right   . CIRCUMCISION N/A 11/13/2015   Procedure: CIRCUMCISION ADULT;  Surgeon: Hollice Espy, MD;  Location: [facility] ORS;  Service: Urology;  Laterality: N/A;  . EYE SURGERY    . HERNIA REPAIR    . HYDROCELE EXCISION Bilateral 11/13/2015   Procedure: HYDROCELECTOMY ADULT;  Surgeon: Hollice Espy, MD;  Location: [facility] ORS;  Service: Urology;  Laterality: Bilateral;  . TONSILLECTOMY  1950's  . TONSILLECTOMY    . UMBILICAL HERNIA REPAIR    . VARICOCELECTOMY  1948  . VASECTOMY      Prior to Admission medications   Medication Sig Start Date End Date Taking? Authorizing Provider  ALPRAZolam Duanne Moron) 0.25 MG tablet Take 0.25 mg by mouth at bedtime as needed for sleep. 08/19/16  Yes [provider]  apixaban (ELIQUIS) 2.5 MG TABS tablet Take 1 tablet (2.5 mg total) by mouth 2 (two)  times daily. 08/25/16  Yes [name], MD  aspirin EC 81 MG EC tablet Take 1 tablet (81 mg total) by mouth daily. 08/25/16  Yes [name], MD  atorvastatin (LIPITOR) 10 MG tablet Take 10 mg by mouth daily.    Yes [provider]  carvedilol (COREG) 3.125 MG tablet Take 3.125 mg by mouth 2 (two) times daily with a meal. 08/19/16  Yes [provider]  ENTRESTO 24-26 MG Take 1 tablet by mouth every 12 (twelve) hours. 02/01/17  Yes [provider]  furosemide (LASIX) 20 MG tablet Take 1 tablet (20 mg total) by mouth 2 (two) times daily. Patient taking differently: Take 40 mg by mouth daily.  08/25/16  Yes [name], MD  KLOR-CON M10 10 MEQ tablet Take 10 mEq by mouth daily. 03/01/17  Yes [provider]  tamsulosin (FLOMAX) 0.4 MG CAPS capsule Take 0.4 mg by mouth. In am.   Yes [provider]  betamethasone dipropionate (DIPROLENE) 0.05 % cream Apply topically 2 (two) times daily. Patient not taking: Reported on 02/15/2016 11/29/15   Zara Council A, PA-C  betamethasone dipropionate (DIPROLENE) 0.05 % cream Apply topically 2 (two) times daily. Patient not taking: Reported on 08/23/2016 02/15/16   Zara Council A, PA-C  docusate sodium (COLACE) 100 MG capsule Take 1 capsule (100 mg total) by mouth 2 (two) times daily. Patient not taking: Reported on 02/15/2016 11/13/15   Hollice Espy, MD    Allergies Patient has no known allergies.  Family History  Problem Relation Age of Onset  . Kidney cancer Neg Hx   . Prostate cancer Neg Hx     Social History Social History  Substance Use Topics  . Smoking status: Former Smoker    Quit date: 11/03/1954  . Smokeless tobacco: Never Used  . Alcohol use 4.8 oz/week    7 Shots of liquor, 1 Standard drinks or equivalent per week     Comment: 1 shot of gin a day.    Review of Systems Constitutional: No fever/chills Eyes: No visual changes. ENT: No sore throat. Cardiovascular: Denies chest pain. Respiratory: Denies shortness of breath. Gastrointestinal: No abdominal pain.  No nausea, no vomiting.  No diarrhea.  No constipation. Genitourinary: Negative for dysuria. Musculoskeletal: Negative for back pain. Skin: Negative for rash. Neurological: Negative for headaches, focal weakness or  numbness.    ____________________________________________   PHYSICAL EXAM:  VITAL SIGNS: ED Triage Vitals  Enc Vitals Group     BP 03/09/17 1626 (!) 125/48     Pulse Rate 03/09/17 1626 (!) 123     Resp 03/09/17 1626 (!) 22     Temp 03/09/17 1626 97.6 F (36.4 C)     Temp Source 03/09/17 1626 Oral     SpO2 03/09/17 1626 (!) 85 %     Weight 03/09/17 1628 152 lb (68.9 kg)     Height 03/09/17 1628 5\' 9"  (1.753 m)     Head Circumference --      Peak Flow --      Pain Score 03/09/17 1631 8     Pain Loc --      Pain Edu? --      Excl. in [place]? --     Constitutional: Alert and oriented. Well appearing and in no acute distress. Eyes: Conjunctivae are normal. Head: Atraumatic. Nose: No congestion/rhinnorhea. Mouth/Throat: Mucous membranes are moist. Neck: No stridor.   Cardiovascular: Normal rate, regular rhythm. Grossly normal heart sounds.  Good peripheral circulation. Respiratory: Normal respiratory  effort.  No retractions. Lungs CTAB. Gastrointestinal: Soft and nontender. No distention. Musculoskeletal: No lower extremity tenderness nor edema. Neurologic:  Normal speech and language. No gross focal neurologic deficits are appreciated.  Skin:  Skin is warm, dry and intact. No rash noted. Psychiatric: Mood and affect are normal. Speech and behavior are normal.  ____________________________________________   LABS (all labs ordered are listed, but only abnormal results are displayed)  Labs Reviewed  COMPREHENSIVE METABOLIC PANEL - Abnormal; Notable for the following:       Result Value   Chloride 100 (*)    Glucose, Bld 141 (*)    BUN 56 (*)    Creatinine, Ser 2.59 (*)    Total Protein 8.3 (*)    Total Bilirubin 2.7 (*)    GFR calc non Af Amer 20 (*)    GFR calc Af Amer 24 (*)    All other components within normal limits  CBC - Abnormal; Notable for the following:    RBC 4.35 (*)    RDW 16.3 (*)    Platelets 140 (*)    All other components within normal limits   URINALYSIS, COMPLETE (UACMP) WITH MICROSCOPIC - Abnormal; Notable for the following:    Color, Urine AMBER (*)    APPearance HAZY (*)    Hgb urine dipstick SMALL (*)    Protein, ur 30 (*)    Bacteria, UA RARE (*)    All other components within normal limits  TROPONIN I - Abnormal; Notable for the following:    Troponin I 0.05 (*)    All other components within normal limits  PROTIME-INR - Abnormal; Notable for the following:    Prothrombin Time 16.0 (*)    All other components within normal limits  APTT - Abnormal; Notable for the following:    aPTT 48 (*)    All other components within normal limits  BRAIN NATRIURETIC PEPTIDE - Abnormal; Notable for the following:    B Natriuretic Peptide 3,413.0 (*)    All other components within normal limits  LACTIC ACID, PLASMA - Abnormal; Notable for the following:    Lactic Acid, Venous 3.0 (*)    All other components within normal limits  LIPASE, BLOOD  BLOOD GAS, ARTERIAL  LACTIC ACID, PLASMA  TYPE AND SCREEN  TYPE AND SCREEN   ____________________________________________  EKG  reviewed and interpreted by me at 1640 Ventricular rate 150 QRS 100 QTC 5:30 atrial fibrillation with underlying left bundle branch block.rapid ventricular response ____________________________________________  RADIOLOGY  Ct Abdomen Pelvis Wo Contrast  Result Date: 03/09/2017 CLINICAL DATA:  83 y/o M; lower abdominal pain with 1 episode of vomiting and coffee-ground emesis. EXAM: CT ABDOMEN AND PELVIS WITHOUT CONTRAST TECHNIQUE: Multidetector CT imaging of the abdomen and pelvis was performed following the standard protocol without IV contrast. COMPARISON:  03/29/2010 CT of the abdomen and pelvis. FINDINGS: Lower chest: Mild coronary artery calcification. Mitral annular calcification. Mild cardiomegaly. Small bilateral pleural effusions. Hepatobiliary: No focal liver abnormality is seen. Mild decreased attenuation of liver. No gallstones, gallbladder wall  thickening, or biliary dilatation. Pancreas: Unremarkable. No pancreatic ductal dilatation or surrounding inflammatory changes. Spleen: Normal in size without focal abnormality. Adrenals/Urinary Tract: Normal adrenal glands. Punctate densities in the left kidney may represent nonobstructing nephrolithiasis. No hydronephrosis. Normal bladder. Stomach/Bowel: Small bowel obstruction with transition of bowel caliber and a small paraumbilical hernia where there is herniation of small bowel. The hernia neck measures 14 mm (series 6, image 61 and series 2 image 50 [id number]).  Normal colon. Normal stomach. Vascular/Lymphatic: Aortic atherosclerosis. No enlarged abdominal or pelvic lymph nodes. Reproductive: Prostate enlargement. Other: Small volume of ascites. Musculoskeletal: No fracture is seen. Multilevel degenerative changes of the spine greatest at L5-S1 where there is moderate disc space narrowing. IMPRESSION: 1. Small bowel obstruction with transition in bowel caliber at a small paraumbilical hernia where there is herniation of a short loop of small bowel. 2. Small volume of ascites. 3. Small bilateral pleural effusions. 4. Cardiomegaly.  Coronary and aortic calcific atherosclerosis. 5. Mild decrease in liver attenuation may represent steatosis or vascular congestion from heart failure. Electronically Signed   By: Kristine Garbe M.D.   On: 03/09/2017 17:40   Dg Abd 1 View  Result Date: 03/09/2017 CLINICAL DATA:  Nasogastric tube placement. EXAM: ABDOMEN - 1 VIEW COMPARISON:  Abdominal CT 03/09/2017 FINDINGS: Enteric catheter projects over the mid upper abdomen, tip in uncertain location. Abdominal ascites. Bilateral pleural effusions. IMPRESSION: Uncertain position of the enteric catheter. Electronically Signed   By: Fidela Salisbury M.D.   On: 03/09/2017 19:05   Dg Chest Portable 1 View  Result Date: 03/09/2017 CLINICAL DATA:  83 y/o  M; abdominal pain and mild hypoxia. EXAM: PORTABLE CHEST 1  VIEW COMPARISON:  08/23/2016 chest radiograph FINDINGS: Stable cardiomegaly given projection and technique. Aortic atherosclerosis with calcification. Small bilateral pleural effusions and bibasilar opacities. Pulmonary vascular congestion. No acute osseous abnormality is evident. IMPRESSION: Stable cardiomegaly. Small bilateral pleural effusions and bibasilar opacities probably representing associated atelectasis. Pulmonary vascular congestion. Electronically Signed   By: Kristine Garbe M.D.   On: 03/09/2017 17:47   reviewed, notable for small bowel obstruction as well as multiple other findings. Please see reports by radiology. ____________________________________________   PROCEDURES  Procedure(s) performed: None  Procedures  Critical Care performed: Yes, see critical care note(s)  CRITICAL CARE Performed by: Delman Kitten   Total critical care time: 50 minutes  Critical care time was exclusive of separately billable procedures and treating other patients.  Critical care was necessary to treat or prevent imminent or life-threatening deterioration.  Critical care was time spent personally by me on the following activities: development of treatment plan with patient and/or surrogate as well as nursing, discussions with consultants, evaluation of patient's response to treatment, examination of patient, obtaining history from patient or surrogate, ordering and performing treatments and interventions, ordering and review of laboratory studies, ordering and review of radiographic studies, pulse oximetry and re-evaluation of patient's condition.  patient presents with an acute abdomen, significant tachycardia, A. fib with RVR. Patient required complex medical care including a stat surgical consultation and CT with associated bowel obstruction ____________________________________________   INITIAL IMPRESSION / ASSESSMENT AND PLAN / ED COURSE  Pertinent labs & imaging results that  were available during my care of the patient were reviewed by me and considered in my medical decision making (see chart for details).  acute abdomen detected by exam. General surgery was called and CT ordered. Patient was seen by general surgery after return from CT, his concern for acute bowel obstruction.  ----------------------------------------- 6:29 PM on 03/09/2017 -----------------------------------------  An patient heart rate is improved slightly after pain medication. He does report his pain is starting to improve. General surgery is seen and evaluated the patient, they're planning to take the patient to the operating room and have requested medical consultation for recommendations regarding the patient's other comorbidities including his A. fib with RVR. At this point, initial surgery of diltiazem, the patient has mild hypotension at times, and  it also appears to be volume overloaded. This will present significant challenge and I suspect for operative repair, and at this point is unclear given he has no associated chest pain and no shortness of breath while at rest I am hesitant to provide rate controlling medication. Suspect his A. fib RVR is likely reactive to CHF, volume overload, and also pain associated bowel obstruction. We'll defer treatment for this and to the hospitalist service and general surgery who are admitting the patient with anticipated disposition to the ICU  Clinical Course as of Mar 09 2099  Sun Mar 09, 2017  1610 internal medicine consult placed with Dr. Lazarus Salines. Requested internal medicine consult and requested general surgery of the patient pending probable operative repair  [MQ]  1907 Dr. Josefa Half reviewed with Dr. Josefa Half, recommends bolus and infusion of IV amiodaroneand initiate Lasix for diuresis.  [MQ]  1923 Dr. Rosana Hoes of general surgery and Dr. Claria Dice (medicine) in room with patient now.  [MQ]    Clinical Course User Index [MQ] Delman Kitten, MD      ____________________________________________   FINAL CLINICAL IMPRESSION(S) / ED DIAGNOSES  Final diagnoses:  Small bowel obstruction ([place])  Hypervolemia, unspecified hypervolemia type  Hypoxia      NEW MEDICATIONS STARTED DURING THIS VISIT:  New Prescriptions   No medications on file     Note:  This document was prepared using Dragon voice recognition software and may include unintentional dictation errors.     Delman Kitten, MD 03/09/17 2101

## 2017-03-09 NOTE — ED Notes (Signed)
Md notified of tropinin 0.05

## 2017-03-09 NOTE — H&P (Signed)
PCP:   Leonel Ramsay, MD   Chief Complaint:  Abdominal pain  HPI: This is a 83 year old male who developed abdominal pain yesterday.pain comes in waves, is described as crampy sharp. He reports mild nausea and vomiting some of it was coffee-ground. He denies any diarrhea. He states he did pass gas earlier today. With these not had a bowel movement in 2 days. He denies any fever but had some chills yesterday. He is short of breath but this is chronic. He is on home oxygen. He has increased lethargic. He finally came to the ER. History provided by the patient as well as his wife is present at bedside. The patient was found to have small bowel obstruction with herniation, atrial fibrillation with RVR and borderline hypotension. The hospitalist have been asked to admit.  Review of Systems:  The patient denies anorexia, fever, weight loss,, vision loss, decreased hearing, hoarseness, chest pain, syncope, dyspnea on exertion, peripheral edema, balance deficits, hemoptysis, nausea, vomiting, abdominal pain, melena, hematochezia, severe indigestion/heartburn, hematuria, incontinence, genital sores, muscle weakness, suspicious skin lesions, transient blindness, difficulty walking, depression, unusual weight change, abnormal bleeding, enlarged lymph nodes, angioedema, and breast masses.  Past Medical History: Past Medical History:  Diagnosis Date  . Acute pulmonary edema ([place]) 02/26/2013  . Benign prostatic hyperplasia with urinary obstruction 02/24/2012  . BP (high blood pressure) 10/31/2015  . Cataract extraction status of left eye   . Cataract extraction status of right eye   . Cerebrovascular accident (CVA) ([place]) 04/24/2012  . Chronic kidney disease 02/16/2014   stage 2  . Congestive heart failure ([place]) 10/31/2015  . HLD (hyperlipidemia) 10/31/2015  . Hydrocele 02/25/2012  . Unstable angina pectoris ([place]) 02/26/2013  . Urge incontinence of urine 05/30/2014  . Urinary urgency 10/31/2015   Past Surgical  History:  Procedure Laterality Date  . ANKLE FRACTURE SURGERY Right   . CIRCUMCISION N/A 11/13/2015   Procedure: CIRCUMCISION ADULT;  Surgeon: Hollice Espy, MD;  Location: [facility] ORS;  Service: Urology;  Laterality: N/A;  . EYE SURGERY    . HERNIA REPAIR    . HYDROCELE EXCISION Bilateral 11/13/2015   Procedure: HYDROCELECTOMY ADULT;  Surgeon: Hollice Espy, MD;  Location: [facility] ORS;  Service: Urology;  Laterality: Bilateral;  . TONSILLECTOMY  1950's  . TONSILLECTOMY    . UMBILICAL HERNIA REPAIR    . VARICOCELECTOMY  1948  . VASECTOMY      Medications: Prior to Admission medications   Medication Sig Start Date End Date Taking? Authorizing Provider  ALPRAZolam Duanne Moron) 0.25 MG tablet Take 0.25 mg by mouth at bedtime as needed for sleep. 08/19/16  Yes [provider]  apixaban (ELIQUIS) 2.5 MG TABS tablet Take 1 tablet (2.5 mg total) by mouth 2 (two) times daily. 08/25/16  Yes [name], MD  aspirin EC 81 MG EC tablet Take 1 tablet (81 mg total) by mouth daily. 08/25/16  Yes [name], MD  atorvastatin (LIPITOR) 10 MG tablet Take 10 mg by mouth daily.    Yes [provider]  carvedilol (COREG) 3.125 MG tablet Take 3.125 mg by mouth 2 (two) times daily with a meal. 08/19/16  Yes [provider]  ENTRESTO 24-26 MG Take 1 tablet by mouth every 12 (twelve) hours. 02/01/17  Yes [provider]  furosemide (LASIX) 20 MG tablet Take 1 tablet (20 mg total) by mouth 2 (two) times daily. Patient taking differently: Take 40 mg by mouth daily.  08/25/16  Yes [name], MD  KLOR-CON M10 10  MEQ tablet Take 10 mEq by mouth daily. 03/01/17  Yes [provider]  tamsulosin (FLOMAX) 0.4 MG CAPS capsule Take 0.4 mg by mouth. In am.   Yes [provider]  betamethasone dipropionate (DIPROLENE) 0.05 % cream Apply topically 2 (two) times daily. Patient not taking: Reported on 02/15/2016 11/29/15   Zara Council A, PA-C  betamethasone dipropionate (DIPROLENE) 0.05 %  cream Apply topically 2 (two) times daily. Patient not taking: Reported on 08/23/2016 02/15/16   Zara Council A, PA-C  docusate sodium (COLACE) 100 MG capsule Take 1 capsule (100 mg total) by mouth 2 (two) times daily. Patient not taking: Reported on 02/15/2016 11/13/15   Hollice Espy, MD    Allergies:  No Known Allergies  Social History:  reports that he quit smoking about 62 years ago. He has never used smokeless tobacco. He reports that he drinks about 4.8 oz of alcohol per week . He reports that he does not use drugs.  Family History: Family History  Problem Relation Age of Onset  . Kidney cancer Neg Hx   . Prostate cancer Neg Hx     Physical Exam: Vitals:   03/09/17 1700 03/09/17 1816 03/09/17 1930 03/09/17 2019  BP: 106/72 (!) 126/96 115/90 104/84  Pulse: (!) 135 (!) 135  (!) 101  Resp: (!) 28 (!) 22  (!) 33  Temp:      TempSrc:      SpO2: 99% 99%  99%  Weight:      Height:        General:  Alert and oriented times three, well developed and nourished, no acute distress Eyes: PERRLA, pink conjunctiva, no scleral icterus ENT: Moist oral mucosa, neck supple, no thyromegaly Lungs: clear to ascultation, no wheeze, no crackles, no use of accessory muscles Cardiovascular: irregular rate and rhythm, no regurgitation, no gallops, no murmurs. No carotid bruits, no JVD Abdomen: soft, no bowel sounds, mild nonspecific tenderness to palpation, somewhat distended, GU: not examined Neuro: CN II - XII grossly intact, sensation intact Musculoskeletal: strength 5/5 all extremities, no clubbing, cyanosis or edema Skin: no rash, no subcutaneous crepitation, no decubitus Psych: appropriate patient   Labs on Admission:   Recent Labs  03/09/17 1634  NA 141  K 4.6  CL 100*  CO2 26  GLUCOSE 141*  BUN 56*  CREATININE 2.59*  CALCIUM 9.9    Recent Labs  03/09/17 1634  AST 38  ALT 23  ALKPHOS 105  BILITOT 2.7*  PROT 8.3*  ALBUMIN 4.1    Recent Labs  03/09/17 1634   LIPASE 37    Recent Labs  03/09/17 1634  WBC 6.4  HGB 13.8  HCT 41.9  MCV 96.5  PLT 140*    Recent Labs  03/09/17 1634  TROPONINI 0.05*   Invalid input(s): POCBNP No results for input(s): DDIMER in the last 72 hours. No results for input(s): HGBA1C in the last 72 hours. No results for input(s): CHOL, HDL, LDLCALC, TRIG, CHOLHDL, LDLDIRECT in the last 72 hours. No results for input(s): TSH, T4TOTAL, T3FREE, THYROIDAB in the last 72 hours.  Invalid input(s): FREET3 No results for input(s): VITAMINB12, FOLATE, FERRITIN, TIBC, IRON, RETICCTPCT in the last 72 hours.  Micro Results: No results found for this or any previous visit (from the past 240 hour(s)).   Radiological Exams on Admission: Ct Abdomen Pelvis Wo Contrast  Result Date: 03/09/2017 CLINICAL DATA:  83 y/o M; lower abdominal pain with 1 episode of vomiting and coffee-ground emesis. EXAM: CT ABDOMEN  AND PELVIS WITHOUT CONTRAST TECHNIQUE: Multidetector CT imaging of the abdomen and pelvis was performed following the standard protocol without IV contrast. COMPARISON:  03/29/2010 CT of the abdomen and pelvis. FINDINGS: Lower chest: Mild coronary artery calcification. Mitral annular calcification. Mild cardiomegaly. Small bilateral pleural effusions. Hepatobiliary: No focal liver abnormality is seen. Mild decreased attenuation of liver. No gallstones, gallbladder wall thickening, or biliary dilatation. Pancreas: Unremarkable. No pancreatic ductal dilatation or surrounding inflammatory changes. Spleen: Normal in size without focal abnormality. Adrenals/Urinary Tract: Normal adrenal glands. Punctate densities in the left kidney may represent nonobstructing nephrolithiasis. No hydronephrosis. Normal bladder. Stomach/Bowel: Small bowel obstruction with transition of bowel caliber and a small paraumbilical hernia where there is herniation of small bowel. The hernia neck measures 14 mm (series 6, image 61 and series 2 image 50  07-1951). Normal colon. Normal stomach. Vascular/Lymphatic: Aortic atherosclerosis. No enlarged abdominal or pelvic lymph nodes. Reproductive: Prostate enlargement. Other: Small volume of ascites. Musculoskeletal: No fracture is seen. Multilevel degenerative changes of the spine greatest at L5-S1 where there is moderate disc space narrowing. IMPRESSION: 1. Small bowel obstruction with transition in bowel caliber at a small paraumbilical hernia where there is herniation of a short loop of small bowel. 2. Small volume of ascites. 3. Small bilateral pleural effusions. 4. Cardiomegaly.  Coronary and aortic calcific atherosclerosis. 5. Mild decrease in liver attenuation may represent steatosis or vascular congestion from heart failure. Electronically Signed   By: Kristine Garbe M.D.   On: 03/09/2017 17:40   Dg Abd 1 View  Result Date: 03/09/2017 CLINICAL DATA:  Nasogastric tube placement. EXAM: ABDOMEN - 1 VIEW COMPARISON:  Abdominal CT 03/09/2017 FINDINGS: Enteric catheter projects over the mid upper abdomen, tip in uncertain location. Abdominal ascites. Bilateral pleural effusions. IMPRESSION: Uncertain position of the enteric catheter. Electronically Signed   By: Fidela Salisbury M.D.   On: 03/09/2017 19:05   Dg Chest Portable 1 View  Result Date: 03/09/2017 CLINICAL DATA:  83 y/o  M; abdominal pain and mild hypoxia. EXAM: PORTABLE CHEST 1 VIEW COMPARISON:  08/23/2016 chest radiograph FINDINGS: Stable cardiomegaly given projection and technique. Aortic atherosclerosis with calcification. Small bilateral pleural effusions and bibasilar opacities. Pulmonary vascular congestion. No acute osseous abnormality is evident. IMPRESSION: Stable cardiomegaly. Small bilateral pleural effusions and bibasilar opacities probably representing associated atelectasis. Pulmonary vascular congestion. Electronically Signed   By: Kristine Garbe M.D.   On: 03/09/2017 17:47    Assessment/Plan Present on  Admission: . Atrial fibrillation with RVR ([place]) -admit to stepdown -Amiodarone drip ordered -Cardiology consulted -Patient eliquis been held in anticipation of possible surgery -High surgical risk  . SBO (small bowel obstruction) (HCC) -nothing by mouth, no IV fluid hydration. NG tube to low wall suction -Surgery seen patient in the ER. Patient herniation was reduced at bedside -fentanyl when necessary pain  . Acute on chronic systolic CHF (congestive heart failure) (HCC) -IV Lasix, hold parameters ordered. Daily weights, strict I's and O's  Elevated troponin -this is chronic, at baseline. We'll repeat a troponin level in a.m.  Marland Kitchen acute on Chronic kidney disease, stage 3 (HCC) -monitor closely as patient's receiving IV Lasix -if significant decline, may need nephrology consult  . Hyperlipidemia -home medications held  [place], Inara Dike 03/09/2017, 9:18 PM

## 2017-03-09 NOTE — ED Notes (Addendum)
surgeon at bedside as well as RT to draw ABG

## 2017-03-09 NOTE — ED Notes (Signed)
NGT placement adjusted at bedside by Dr Ferrel Logan, inserted slightly further into nare

## 2017-03-09 NOTE — ED Notes (Signed)
Lab called with lactic acid result of 3.0; Dr Rosana Hoes at bedside when call received and notified of same; no new orders given at this time;

## 2017-03-09 NOTE — Progress Notes (Signed)
Patient seen and examined, admitted to ICU, reports complete resolution of abdominal pain and denies N/V, fever/chills, CP, or SOB, though uncertain whether passing flatus. NG tube reinserted and secured. BP 104/84 (BP Location: Right Arm)   Pulse (!) 101   Temp 97.6 F (36.4 C) (Oral)   Resp (!) 33   Ht 5\' 9"  (1.753 m)   Wt 152 lb (68.9 kg)   SpO2 99%   BMI 22.45 kg/m  Abdomen is soft, NT, and mildly distended (unchanged from ED at admission), umbilical hernia remains reduced. Patient continues to be confused (baseline per patient's wife). Remains tachycardic and mildly hypotensive. Continue medical management, supportive care.  -- Marilynne Drivers Rosana Hoes, MD, [place]: [facility] - Partnering for exceptional care. Office: [phone]

## 2017-03-09 NOTE — ED Notes (Signed)
Admitting MD at bedside; pt's NGT noted to be no longer placed, hanging off the side of the bed; will replace per MD order

## 2017-03-09 NOTE — ED Triage Notes (Signed)
PT to ED reporting lower abd pain since yesterday with one episode of vomiting with coffee ground emesis. Pt also reports he has been unable to have a BM today which is abnormal for him. Pt reports he is currently on a blood thinner.   Pt also arrived to ED on his own oxygen at [facility] was 85% when placed on the monitor. PT place don 3L and is currently at 98%. Pt has hx of afib but is also tachycardic at 146 on the monitor.

## 2017-03-09 NOTE — Progress Notes (Signed)
Patient ID: Curly Mackowski Sr., male   DOB: 14-Oct-1925, 83 y.o.   MRN: [id number]       [place] Hospital Day(s): 0.   Interval History: Patient seen and re examined and the hernia was able to be reduced after pain medication was given. Patient refers some pain relieve after the reduction. Interval placement of NGT, Foley and new labs was done.   Vital signs in last 24 hours: [min-max] current  Temp:  [97.6 F (36.4 C)] 97.6 F (36.4 C) (10/14 1626) Pulse Rate:  [123-135] 135 (10/14 1816) Resp:  [22-28] 22 (10/14 1816) BP: (106-126)/(48-96) 126/96 (10/14 1816) SpO2:  [85 %-99 %] 99 % (10/14 1816) Weight:  [68.9 kg (152 lb)] 68.9 kg (152 lb) (10/14 1632)     Height: 5\' 9"  (175.3 cm) Weight: 68.9 kg (152 lb) BMI (Calculated): 22.44    Physical Exam:   Gastrointestinal: Umbilical hernia reduced, decreased tenderness. Still dull to palpation and distended.   Labs:  CBC Latest Ref Rng & Units 03/09/2017 11/20/2016 08/24/2016  WBC 3.8 - 10.6 K/uL 6.4 10.6 5.9  Hemoglobin 13.0 - 18.0 g/dL 13.8 14.3 14.0  Hematocrit 40.0 - 52.0 % 41.9 42.5 41.8  Platelets 150 - 440 K/uL 140(L) 157 153   CMP Latest Ref Rng & Units 03/09/2017 11/20/2016 08/25/2016  Glucose 65 - 99 mg/dL 141(H) 119(H) 83  BUN 6 - 20 mg/dL 56(H) 44(H) 31(H)  Creatinine 0.61 - 1.24 mg/dL 2.59(H) 2.28(H) 1.92(H)  Sodium 135 - 145 mmol/L 141 134(L) 140  Potassium 3.5 - 5.1 mmol/L 4.6 4.1 3.7  Chloride 101 - 111 mmol/L 100(L) 98(L) 108  CO2 22 - 32 mmol/L 26 24 24   Calcium 8.9 - 10.3 mg/dL 9.9 9.4 8.8(L)  Total Protein 6.5 - 8.1 g/dL 8.3(H) - -  Total Bilirubin 0.3 - 1.2 mg/dL 2.7(H) - -  Alkaline Phos 38 - 126 U/L 105 - -  AST 15 - 41 U/L 38 - -  ALT 17 - 63 U/L 23 - -    Assessment/Plan:  Patient with umbilical hernia with small bowel obstruction was able to reduce the hernia. Patient and his wife were oriented about the diagnosis and the high risk of having surgery at this moment due to the multiple  comorbidity and their exacerbation. Patient was started on Amiodarone drip for rate control, diuresis was started by ED physician for the pleural effusions, NGT is at suction and will follow with serial abdominal exam and his clinical progress. Conversation with the wife was clear that the risk of surgery for this patient are extremely high and patient will be treated for all his acute conditions so if the patient needs surgery to be able to take him in his best shape. Will follow up closely.   All of the above findings and recommendations were discussed with patient's wife, and the medical team, and all questions were answered to her who expressed satisfaction.

## 2017-03-09 NOTE — ED Notes (Signed)
Respiratory called for abg.

## 2017-03-09 NOTE — ED Triage Notes (Signed)
FIRST NURSE NOTE-coffee ground emesis, on eliquis.

## 2017-03-09 NOTE — Consult Note (Signed)
PULMONARY / CRITICAL CARE MEDICINE   Name: Ruben Brisbin Sr. MRN: [id number] DOB: Jan 27, 1926    ADMISSION DATE:  03/09/2017   CONSULTATION DATE:  03/09/2017  REFERRING MD:  Dr. Claria Dice  REASON: SBO and hypotension  CHIEF COMPLAINT:  Abdominal pain x 1 day  HISTORY OF PRESENT ILLNESS:   This is an 83 y/o male with a PMH as indicated below who presented to the ED with complaints of abdominal pain and vomiting. Symptoms associated with constipation. Patient has a h/o umbilical hernia. Hernia was prominent upon ED presentation. He has been seen by surgery and the hernia was reduced. He now has an NGT to LIS. Reports improvement in symptoms.  His SPO2 is stable on 3L [place]. Denies chest pain, palpitations, and dyspnea. He is confused at baseline.   PAST MEDICAL HISTORY :  He  has a past medical history of Acute pulmonary edema ([place]) (02/26/2013); Benign prostatic hyperplasia with urinary obstruction (02/24/2012); BP (high blood pressure) (10/31/2015); Cataract extraction status of left eye; Cataract extraction status of right eye; Cerebrovascular accident (CVA) ([place]) (04/24/2012); Chronic kidney disease (02/16/2014); Congestive heart failure ([place]) (10/31/2015); HLD (hyperlipidemia) (10/31/2015); Hydrocele (02/25/2012); Unstable angina pectoris ([place]) (02/26/2013); Urge incontinence of urine (05/30/2014); and Urinary urgency (10/31/2015).  PAST SURGICAL HISTORY: He  has a past surgical history that includes Vasectomy; Varicocelectomy (1948); Tonsillectomy (1950's); Umbilical hernia repair; Ankle fracture surgery (Right); Tonsillectomy; Hernia repair; Eye surgery; Circumcision (N/A, 11/13/2015); and Hydrocele surgery (Bilateral, 11/13/2015).  No Known Allergies  No current facility-administered medications on file prior to encounter.    Current Outpatient Prescriptions on File Prior to Encounter  Medication Sig  . ALPRAZolam (XANAX) 0.25 MG tablet Take 0.25 mg by mouth at bedtime as needed for sleep.  Marland Kitchen apixaban  (ELIQUIS) 2.5 MG TABS tablet Take 1 tablet (2.5 mg total) by mouth 2 (two) times daily.  Marland Kitchen aspirin EC 81 MG EC tablet Take 1 tablet (81 mg total) by mouth daily.  Marland Kitchen atorvastatin (LIPITOR) 10 MG tablet Take 10 mg by mouth daily.   . carvedilol (COREG) 3.125 MG tablet Take 3.125 mg by mouth 2 (two) times daily with a meal.  . furosemide (LASIX) 20 MG tablet Take 1 tablet (20 mg total) by mouth 2 (two) times daily. (Patient taking differently: Take 40 mg by mouth daily. )  . tamsulosin (FLOMAX) 0.4 MG CAPS capsule Take 0.4 mg by mouth. In am.  . betamethasone dipropionate (DIPROLENE) 0.05 % cream Apply topically 2 (two) times daily. (Patient not taking: Reported on 02/15/2016)  . betamethasone dipropionate (DIPROLENE) 0.05 % cream Apply topically 2 (two) times daily. (Patient not taking: Reported on 08/23/2016)  . docusate sodium (COLACE) 100 MG capsule Take 1 capsule (100 mg total) by mouth 2 (two) times daily. (Patient not taking: Reported on 02/15/2016)    FAMILY HISTORY:  His has no family status information on file.    SOCIAL HISTORY: He  reports that he quit smoking about 62 years ago. He has never used smokeless tobacco. He reports that he drinks about 4.8 oz of alcohol per week . He reports that he does not use drugs.  REVIEW OF SYSTEMS:   Constitutional: Negative for fever and chills.  HENT: Negative for congestion and rhinorrhea.  Eyes: Negative for redness and visual disturbance.  Respiratory: Negative for shortness of breath and wheezing.  Cardiovascular: Negative for chest pain and palpitations.  Gastrointestinal: Positive for nausea , anorexia, vomiting and abdominal pain but negative for loose stools Genitourinary: Negative for dysuria and  urgency.  Endocrine: Denies polyuria, polyphagia and heat intolerance Musculoskeletal: Negative for myalgias and arthralgias.  Skin: Negative for pallor and wound.  Neurological: Negative for dizziness and headaches   SUBJECTIVE:   VITAL  SIGNS: BP 104/84 (BP Location: Right Arm)   Pulse (!) 101   Temp 97.6 F (36.4 C) (Oral)   Resp (!) 33   Ht 5\' 9"  (1.753 m)   Wt 152 lb (68.9 kg)   SpO2 99%   BMI 22.45 kg/m   HEMODYNAMICS:    VENTILATOR SETTINGS:    INTAKE / OUTPUT: No intake/output data recorded.  PHYSICAL EXAMINATION: General:  NAD Neuro:  AAOX 3, CN intact, no deficits HEENT:  [place]/AT, NGT, PERRLA, oral mucosa dry, trachea midline Cardiovascular:  Irregular, S1/S2, no MRG, +2 pulses, no edema Lungs:  CTAB Abdomen: mildly distended, normal bowel sounds, + pain with palpation in BL lower quadrants, hypoactive bowel sounds Musculoskeletal:  +ROM, no deformities Skin:  Warm and dry  LABS:  BMET  Recent Labs Lab 03/09/17 1634  NA 141  K 4.6  CL 100*  CO2 26  BUN 56*  CREATININE 2.59*  GLUCOSE 141*    Electrolytes  Recent Labs Lab 03/09/17 1634  CALCIUM 9.9  MG 2.6*  PHOS 6.0*    CBC  Recent Labs Lab 03/09/17 1634  WBC 6.4  HGB 13.8  HCT 41.9  PLT 140*    Coag's  Recent Labs Lab 03/09/17 1654  APTT 48*  INR 1.29    Sepsis Markers  Recent Labs Lab 03/09/17 1840 03/09/17 2156  LATICACIDVEN 3.0* 3.3*    ABG  Recent Labs Lab 03/09/17 1855  PHART 7.37  PCO2ART 43  PO2ART 87    Liver Enzymes  Recent Labs Lab 03/09/17 1634  AST 38  ALT 23  ALKPHOS 105  BILITOT 2.7*  ALBUMIN 4.1    Cardiac Enzymes  Recent Labs Lab 03/09/17 1634  TROPONINI 0.05*    Glucose  Recent Labs Lab 03/09/17 2154  GLUCAP 146*    Imaging Ct Abdomen Pelvis Wo Contrast  Result Date: 03/09/2017 CLINICAL DATA:  83 y/o M; lower abdominal pain with 1 episode of vomiting and coffee-ground emesis. EXAM: CT ABDOMEN AND PELVIS WITHOUT CONTRAST TECHNIQUE: Multidetector CT imaging of the abdomen and pelvis was performed following the standard protocol without IV contrast. COMPARISON:  03/29/2010 CT of the abdomen and pelvis. FINDINGS: Lower chest: Mild coronary artery  calcification. Mitral annular calcification. Mild cardiomegaly. Small bilateral pleural effusions. Hepatobiliary: No focal liver abnormality is seen. Mild decreased attenuation of liver. No gallstones, gallbladder wall thickening, or biliary dilatation. Pancreas: Unremarkable. No pancreatic ductal dilatation or surrounding inflammatory changes. Spleen: Normal in size without focal abnormality. Adrenals/Urinary Tract: Normal adrenal glands. Punctate densities in the left kidney may represent nonobstructing nephrolithiasis. No hydronephrosis. Normal bladder. Stomach/Bowel: Small bowel obstruction with transition of bowel caliber and a small paraumbilical hernia where there is herniation of small bowel. The hernia neck measures 14 mm (series 6, image 61 and series 2 image 50 07-1953). Normal colon. Normal stomach. Vascular/Lymphatic: Aortic atherosclerosis. No enlarged abdominal or pelvic lymph nodes. Reproductive: Prostate enlargement. Other: Small volume of ascites. Musculoskeletal: No fracture is seen. Multilevel degenerative changes of the spine greatest at L5-S1 where there is moderate disc space narrowing. IMPRESSION: 1. Small bowel obstruction with transition in bowel caliber at a small paraumbilical hernia where there is herniation of a short loop of small bowel. 2. Small volume of ascites. 3. Small bilateral pleural effusions. 4. Cardiomegaly.  Coronary  and aortic calcific atherosclerosis. 5. Mild decrease in liver attenuation may represent steatosis or vascular congestion from heart failure. Electronically Signed   By: Kristine Garbe M.D.   On: 03/09/2017 17:40   Dg Abd 1 View  Result Date: 03/09/2017 CLINICAL DATA:  Nasogastric tube placement. EXAM: ABDOMEN - 1 VIEW COMPARISON:  Abdominal CT 03/09/2017 FINDINGS: Enteric catheter projects over the mid upper abdomen, tip in uncertain location. Abdominal ascites. Bilateral pleural effusions. IMPRESSION: Uncertain position of the enteric catheter.  Electronically Signed   By: Fidela Salisbury M.D.   On: 03/09/2017 19:05   Dg Chest Portable 1 View  Result Date: 03/09/2017 CLINICAL DATA:  83 y/o  M; abdominal pain and mild hypoxia. EXAM: PORTABLE CHEST 1 VIEW COMPARISON:  08/23/2016 chest radiograph FINDINGS: Stable cardiomegaly given projection and technique. Aortic atherosclerosis with calcification. Small bilateral pleural effusions and bibasilar opacities. Pulmonary vascular congestion. No acute osseous abnormality is evident. IMPRESSION: Stable cardiomegaly. Small bilateral pleural effusions and bibasilar opacities probably representing associated atelectasis. Pulmonary vascular congestion. Electronically Signed   By: Kristine Garbe M.D.   On: 03/09/2017 17:47   Dg Abd Portable 1 View  Result Date: 03/09/2017 CLINICAL DATA:  Nasogastric tube placement EXAM: PORTABLE ABDOMEN - 1 VIEW COMPARISON:  None. FINDINGS: The nasogastric tube extends into the stomach with tip in the region of the mid gastric body. IMPRESSION: Nasogastric tube extends into the stomach. Electronically Signed   By: Andreas Newport M.D.   On: 03/09/2017 21:19    STUDIES:  None  CULTURES: none  ANTIBIOTICS: none  SIGNIFICANT EVENTS: 10/14>admitted  LINES/TUBES: PIVs  DISCUSSION: 83 y/o male presenting with a SBO, afib with rvr and shock  ASSESSMENT  Small bowel obstruction AFib with RVR BL pleural effusions Hypotension  PLAN Hemodynamics per ICU protocol Amio gtt Surgery following Cardiology consulted Hold diuretics 2/2 hypotension Prn fentanyl for pain Zofran for N/V Resume all home medications Keep NPO GI and DVT prophylaxis  FAMILY  - Updates: Patient and wife updated at bedside   - Inter-disciplinary family meet or Palliative Care meeting due by:  day 7  Magdalene S. [facility] ANP-BC Pulmonary and [place] Pager [phone] or [phone]  03/09/2017, 11:06 PM   PCCM ATTENDING  ATTESTATION:  I have evaluated patient with the APP [place], reviewed database in its entirety and discussed care plan in detail. In addition, this patient was discussed on multidisciplinary rounds.   Important exam findings: Mildly confused NAD HEENT WNL No JVD ntoed Chest clear IRIR, rate controlled, no M Abdomen mildly distended, NABS No LE edema No focal neuro deficits  CXR: small bilateral effusions  Major problems addressed by PCCM team: SBO due to incarcerated umbilical hernia, now reduced Severe cardiomyopathy Chronic atrial fibrillation Borderline hypotension - discussed with Dr. Ubaldo Glassing who reports that his BP always tends to run low  PLAN/REC: Discussed with Dr.Pabon  Hold further Lasix for now Continue amiodarone infusion - transition to by mouth when able Advance diet as tolerated Transfer to telemetry  After transfer, PCCM will sign off. Please call if we can be of further assistance    Merton Border, MD PCCM service Mobile [phone] Pager [phone] 03/10/2017 3:14 PM

## 2017-03-09 NOTE — Consult Note (Signed)
SURGICAL CONSULTATION NOTE   HISTORY OF PRESENT ILLNESS (HPI):  83 y.o. male with past medical history of Afib on Xarelto, CHF, Chronic Kidney disease stage 3, presented to [facility] ED for evaluation of abdominal pain. Patient reports being in his usual state of health until yesterday when he started with abdominal pain on the lower abdomen. The pain has been getting worse and radiating through the midline to the epigastric area. Pain was associated with one episode of vomiting. Right now the pain is generalized but most severe around the periumbilical area. Wife refers he had umbilical hernia repair ~5 years ago. She refers patient never complaint of recurrence after the surgery. Denies fever or chills. Patient has been recently treated for worsening kidney function.    Surgery is consulted by Dr. Jacqualine Code in this context for evaluation and management of abdominal pain.  PAST MEDICAL HISTORY (PMH):  Past Medical History:  Diagnosis Date  . Acute pulmonary edema ([place]) 02/26/2013  . Benign prostatic hyperplasia with urinary obstruction 02/24/2012  . BP (high blood pressure) 10/31/2015  . Cataract extraction status of left eye   . Cataract extraction status of right eye   . Cerebrovascular accident (CVA) ([place]) 04/24/2012  . Chronic kidney disease 02/16/2014   stage 2  . Congestive heart failure ([place]) 10/31/2015  . HLD (hyperlipidemia) 10/31/2015  . Hydrocele 02/25/2012  . Unstable angina pectoris ([place]) 02/26/2013  . Urge incontinence of urine 05/30/2014  . Urinary urgency 10/31/2015     PAST SURGICAL HISTORY [facility]):  Past Surgical History:  Procedure Laterality Date  . ANKLE FRACTURE SURGERY Right   . CIRCUMCISION N/A 11/13/2015   Procedure: CIRCUMCISION ADULT;  Surgeon: Hollice Espy, MD;  Location: [facility] ORS;  Service: Urology;  Laterality: N/A;  . EYE SURGERY    . HERNIA REPAIR    . HYDROCELE EXCISION Bilateral 11/13/2015   Procedure: HYDROCELECTOMY ADULT;  Surgeon: Hollice Espy, MD;  Location: [facility]  ORS;  Service: Urology;  Laterality: Bilateral;  . TONSILLECTOMY  1950's  . TONSILLECTOMY    . UMBILICAL HERNIA REPAIR    . VARICOCELECTOMY  1948  . VASECTOMY       MEDICATIONS:  Prior to Admission medications   Medication Sig Start Date End Date Taking? Authorizing Provider  ALPRAZolam Duanne Moron) 0.25 MG tablet Take 0.25 mg by mouth at bedtime as needed for sleep. 08/19/16  Yes [provider]  apixaban (ELIQUIS) 2.5 MG TABS tablet Take 1 tablet (2.5 mg total) by mouth 2 (two) times daily. 08/25/16  Yes [name], MD  aspirin EC 81 MG EC tablet Take 1 tablet (81 mg total) by mouth daily. 08/25/16  Yes [name], MD  atorvastatin (LIPITOR) 10 MG tablet Take 10 mg by mouth daily.    Yes [provider]  carvedilol (COREG) 3.125 MG tablet Take 3.125 mg by mouth 2 (two) times daily with a meal. 08/19/16  Yes [provider]  ENTRESTO 24-26 MG Take 1 tablet by mouth every 12 (twelve) hours. 02/01/17  Yes [provider]  furosemide (LASIX) 20 MG tablet Take 1 tablet (20 mg total) by mouth 2 (two) times daily. Patient taking differently: Take 40 mg by mouth daily.  08/25/16  Yes [name], MD  KLOR-CON M10 10 MEQ tablet Take 10 mEq by mouth daily. 03/01/17  Yes [provider]  tamsulosin (FLOMAX) 0.4 MG CAPS capsule Take 0.4 mg by mouth. In am.   Yes [provider]  betamethasone dipropionate (DIPROLENE) 0.05 % cream Apply topically 2 (  two) times daily. Patient not taking: Reported on 02/15/2016 11/29/15   Zara Council A, PA-C  betamethasone dipropionate (DIPROLENE) 0.05 % cream Apply topically 2 (two) times daily. Patient not taking: Reported on 08/23/2016 02/15/16   Zara Council A, PA-C  docusate sodium (COLACE) 100 MG capsule Take 1 capsule (100 mg total) by mouth 2 (two) times daily. Patient not taking: Reported on 02/15/2016 11/13/15   Hollice Espy, MD     ALLERGIES:  No Known Allergies   SOCIAL HISTORY:  Social History   Social  History  . Marital status: Married    Spouse name: N/A  . Number of children: N/A  . Years of education: N/A   Occupational History  . Not on file.   Social History Main Topics  . Smoking status: Former Smoker    Quit date: 11/03/1954  . Smokeless tobacco: Never Used  . Alcohol use 4.8 oz/week    7 Shots of liquor, 1 Standard drinks or equivalent per week     Comment: 1 shot of gin a day.  . Drug use: No  . Sexual activity: Not on file   Other Topics Concern  . Not on file   Social History Narrative  . No narrative on file     FAMILY HISTORY:  Family History  Problem Relation Age of Onset  . Kidney cancer Neg Hx   . Prostate cancer Neg Hx      REVIEW OF SYSTEMS:  Constitutional: Positive for weight loss. Negative fever, chills, or sweats  Eyes: denies any other vision changes, history of eye injury  ENT: denies sore throat, hearing problems  Respiratory: positive for shortness of breath. Negative for wheezing  Cardiovascular: denies chest pain. Positive for palpitations.   Gastrointestinal: Positive for abdominal pain abdominal pain, N/V as per HPI Genitourinary: denies burning with urination. Positive for urinary frequency Musculoskeletal: denies any other joint pains or cramps  Skin: denies any other rashes or skin discolorations  Neurological: denies any other headache, dizziness, weakness  Psychiatric: denies any other depression, anxiety   All other review of systems were negative   VITAL SIGNS:  Temp:  [97.6 F (36.4 C)] 97.6 F (36.4 C) (10/14 1626) Pulse Rate:  [123-135] 135 (10/14 1816) Resp:  [22-28] 22 (10/14 1816) BP: (106-126)/(48-96) 126/96 (10/14 1816) SpO2:  [85 %-99 %] 99 % (10/14 1816) Weight:  [68.9 kg (152 lb)] 68.9 kg (152 lb) (10/14 1632)     Height: 5\' 9"  (175.3 cm) Weight: 68.9 kg (152 lb) BMI (Calculated): 22.44    PHYSICAL EXAM:  Constitutional:  -- Awake, alert, and oriented x3. In acute pain and distress  Eyes:  --  No scleral  icterus  Ear, nose, and throat:  -- Bilateral jugular venous distension  Pulmonary:  -- Bibasilar crackles  -- Equal breath sounds bilaterally -- Labored breathing at rest Cardiovascular:  -- Irregular rate and rythm Gastrointestinal:  -- Abdomen tense, tender, distended, guarding and rebound tenderness -- No abdominal masses appreciated, pulsatile or otherwise  --Umbilical hernia with incarcerated content  Extremities:  --no cyanosis Neuro -- Motor function: intact and symmetric -- Sensation: intact and symmetric   Labs:  CBC Latest Ref Rng & Units 03/09/2017 11/20/2016 08/24/2016  WBC 3.8 - 10.6 K/uL 6.4 10.6 5.9  Hemoglobin 13.0 - 18.0 g/dL 13.8 14.3 14.0  Hematocrit 40.0 - 52.0 % 41.9 42.5 41.8  Platelets 150 - 440 K/uL 140(L) 157 153   CMP Latest Ref Rng & Units 03/09/2017 11/20/2016 08/25/2016  Glucose  65 - 99 mg/dL 141(H) 119(H) 83  BUN 6 - 20 mg/dL 56(H) 44(H) 31(H)  Creatinine 0.61 - 1.24 mg/dL 2.59(H) 2.28(H) 1.92(H)  Sodium 135 - 145 mmol/L 141 134(L) 140  Potassium 3.5 - 5.1 mmol/L 4.6 4.1 3.7  Chloride 101 - 111 mmol/L 100(L) 98(L) 108  CO2 22 - 32 mmol/L 26 24 24   Calcium 8.9 - 10.3 mg/dL 9.9 9.4 8.8(L)  Total Protein 6.5 - 8.1 g/dL 8.3(H) - -  Total Bilirubin 0.3 - 1.2 mg/dL 2.7(H) - -  Alkaline Phos 38 - 126 U/L 105 - -  AST 15 - 41 U/L 38 - -  ALT 17 - 63 U/L 23 - -   Coagulation:  Lab Results  Component Value Date   INR 1.29 03/09/2017   INR 1.0 05/28/2012   APTT 48 (H) 03/09/2017   APTT 116.6 (H) 05/29/2012   Cardiac markers:  Lab Results  Component Value Date   CKMB 4.3 (H) 05/28/2012   ABGs: No results found for: PH  Imaging studies:  I reviewed the Images of the CT scan and patient was found with an small bowel obstruction with a transition point on the umbilical hernia.   Assessment/Plan: (ICD-10's: K42.0) 83 y.o. male with incarcerated umbilical hernia, complicated by pertinent comorbidities including afib on Xarelto, CHF, chronic kidney  disease among other. On physical exam there is acute abdomen most likely to strangulation of the bowel loop inside the umbilical hernia. Will discuss with family member about the treatment alternatives including surgery and its increased risks in this patient.    - NGT  - Foley Catheter  - IM evaluation for heart rate control and recommendations regarding multiple medical conditions.   - If family agree with surgery will contact ICU for critical care management after surgery.

## 2017-03-10 ENCOUNTER — Inpatient Hospital Stay: Payer: Medicare Other

## 2017-03-10 DIAGNOSIS — I482 Chronic atrial fibrillation: Secondary | ICD-10-CM

## 2017-03-10 DIAGNOSIS — K56609 Unspecified intestinal obstruction, unspecified as to partial versus complete obstruction: Secondary | ICD-10-CM

## 2017-03-10 DIAGNOSIS — I959 Hypotension, unspecified: Secondary | ICD-10-CM

## 2017-03-10 LAB — CBC
HCT: 35.9 % — ABNORMAL LOW (ref 40.0–52.0)
HEMOGLOBIN: 12 g/dL — AB (ref 13.0–18.0)
MCH: 31.9 pg (ref 26.0–34.0)
MCHC: 33.3 g/dL (ref 32.0–36.0)
MCV: 95.8 fL (ref 80.0–100.0)
Platelets: 109 10*3/uL — ABNORMAL LOW (ref 150–440)
RBC: 3.75 MIL/uL — ABNORMAL LOW (ref 4.40–5.90)
RDW: 15.9 % — AB (ref 11.5–14.5)
WBC: 5.3 10*3/uL (ref 3.8–10.6)

## 2017-03-10 LAB — BASIC METABOLIC PANEL
ANION GAP: 12 (ref 5–15)
BUN: 71 mg/dL — ABNORMAL HIGH (ref 6–20)
CALCIUM: 9 mg/dL (ref 8.9–10.3)
CHLORIDE: 103 mmol/L (ref 101–111)
CO2: 26 mmol/L (ref 22–32)
CREATININE: 3 mg/dL — AB (ref 0.61–1.24)
GFR calc Af Amer: 20 mL/min — ABNORMAL LOW (ref >60)
GFR calc non Af Amer: 17 mL/min — ABNORMAL LOW (ref >60)
GLUCOSE: 149 mg/dL — AB (ref 65–99)
Potassium: 4.5 mmol/L (ref 3.5–5.1)
Sodium: 141 mmol/L (ref 135–145)

## 2017-03-10 LAB — APTT: aPTT: 53 seconds — ABNORMAL HIGH (ref 24–36)

## 2017-03-10 LAB — PROCALCITONIN: Procalcitonin: <0.1 ng/mL

## 2017-03-10 LAB — PROTIME-INR
INR: 1.35
Prothrombin Time: 16.6 seconds — ABNORMAL HIGH (ref 11.4–15.2)

## 2017-03-10 MED ORDER — DIPHENHYDRAMINE HCL 50 MG/ML IJ SOLN
INTRAMUSCULAR | Status: AC
  Administered 2017-03-10: 25 mg via INTRAVENOUS
  Filled 2017-03-10: qty 1

## 2017-03-10 MED ORDER — HALOPERIDOL LACTATE 5 MG/ML IJ SOLN
INTRAMUSCULAR | Status: AC
  Filled 2017-03-10: qty 1

## 2017-03-10 MED ORDER — DIPHENHYDRAMINE HCL 50 MG/ML IJ SOLN
25.0000 mg | Freq: Once | INTRAMUSCULAR | Status: AC
  Administered 2017-03-10: 25 mg via INTRAVENOUS

## 2017-03-10 MED ORDER — SODIUM CHLORIDE 0.9 % IV BOLUS (SEPSIS)
250.0000 mL | Freq: Once | INTRAVENOUS | Status: AC
  Administered 2017-03-10: 250 mL via INTRAVENOUS

## 2017-03-10 MED ORDER — HALOPERIDOL LACTATE 5 MG/ML IJ SOLN
2.0000 mg | Freq: Once | INTRAMUSCULAR | Status: AC
  Administered 2017-03-10: 2 mg via INTRAVENOUS

## 2017-03-10 NOTE — Progress Notes (Signed)
Patient seen and examined, continues to deny abdominal pain, N/V, fever/chills, CP, or SOB, though remains uncertain whether passing flatus (none witnessed by patient's wife and RN at bedside). NG tube remains secured, draining very little. Patient's cardiac rhythm has been variable overnight with persistent tachycardia and hypotension, though better controlled than at time of admission. BP (!) 68/55 (BP Location: Right Arm)   Pulse (!) 115   Temp 97.6 F (36.4 C) (Oral)   Resp (!) 30   Ht 5\' 9"  (1.753 m)   Wt 152 lb (68.9 kg)   SpO2 (!) 87%   BMI 22.45 kg/m  Abdomen is soft, NT, and ND with umbilical hernia remaining reduced. Continue medical management, supportive care. Signs/symptoms of hernia, bowel obstruction, and incarceration discussed with wife, who expresses understanding, and strategies for self-reduction of patient's umbilical hernia were reviewed. No plans for surgical intervention at this time.  -- Marilynne Drivers Rosana Hoes, MD, [place]: [facility] - Partnering for exceptional care. Office: [phone]

## 2017-03-10 NOTE — Consult Note (Signed)
[place]  CARDIOLOGY CONSULT NOTE  Patient ID: Ruben Garringer Sr. MRN: [id number] DOB/AGE: 1925/06/12 83 y.o.  Admit date: 03/09/2017 Referring Physician Dr. Benjie Karvonen Primary Physician  Dr. Ola Spurr Primary Cardiologist Dr. Ubaldo Glassing Reason for Consultation afib  HPI: Pt is a 83 yo male with history of cardiomyopathy with ef of 10-15%, history of cva, history of afib treated with apixaban for anticoagulation, CKD, systolic chf treated with entresto, carvedilol and furosemide and prn zaroxolyn, who was admitted with probable sbo and hypotension. He developed abdominal pain and presented to the er where he was noted to be in afib with rvr. CT of the abdomen and pelvis done for vomiting with probable coffee ground emesis showed sbo and small paraumbilical hernia, ascites, small pleural effusions. CXR showed small bilateral pleural effusions.  SBP is currently 80-85. He is currently on an amiodarone drip, and has been diiuresed. He was taken off of carvedilol and entresto due to hypotension. Receiving rehydration. His NG tube has been removed and he is tolerating clear liquids at present. Ventricular rate is low 100;s at present. He denies chest pain or abdominal pian at present. Nausea has improved.   Review of Systems  HENT: Negative.   Eyes: Negative.   Respiratory: Positive for shortness of breath.   Cardiovascular: Positive for leg swelling.  Gastrointestinal: Positive for abdominal pain, nausea and vomiting.  Genitourinary: Negative.   Musculoskeletal: Negative.   Skin: Negative.   Neurological: Positive for weakness.  Endo/Heme/Allergies: Negative.   Psychiatric/Behavioral: Negative.     Past Medical History:  Diagnosis Date  . Acute pulmonary edema ([place]) 02/26/2013  . Benign prostatic hyperplasia with urinary obstruction 02/24/2012  . BP (high blood pressure) 10/31/2015  . Cataract extraction status of left eye   . Cataract extraction  status of right eye   . Cerebrovascular accident (CVA) ([place]) 04/24/2012  . Chronic kidney disease 02/16/2014   stage 2  . Congestive heart failure ([place]) 10/31/2015  . HLD (hyperlipidemia) 10/31/2015  . Hydrocele 02/25/2012  . Unstable angina pectoris ([place]) 02/26/2013  . Urge incontinence of urine 05/30/2014  . Urinary urgency 10/31/2015    Family History  Problem Relation Age of Onset  . Kidney cancer Neg Hx   . Prostate cancer Neg Hx     Social History   Social History  . Marital status: Married    Spouse name: N/A  . Number of children: N/A  . Years of education: N/A   Occupational History  . Not on file.   Social History Main Topics  . Smoking status: Former Smoker    Quit date: 11/03/1954  . Smokeless tobacco: Never Used  . Alcohol use 4.8 oz/week    7 Shots of liquor, 1 Standard drinks or equivalent per week     Comment: 1 shot of gin a day.  . Drug use: No  . Sexual activity: Not on file   Other Topics Concern  . Not on file   Social History Narrative  . No narrative on file    Past Surgical History:  Procedure Laterality Date  . ANKLE FRACTURE SURGERY Right   . CIRCUMCISION N/A 11/13/2015   Procedure: CIRCUMCISION ADULT;  Surgeon: Hollice Espy, MD;  Location: [facility] ORS;  Service: Urology;  Laterality: N/A;  . EYE SURGERY    . HERNIA REPAIR    . HYDROCELE EXCISION Bilateral 11/13/2015   Procedure: HYDROCELECTOMY ADULT;  Surgeon: Hollice Espy, MD;  Location: [facility] ORS;  Service: Urology;  Laterality: Bilateral;  . TONSILLECTOMY  1950's  . TONSILLECTOMY    . UMBILICAL HERNIA REPAIR    . VARICOCELECTOMY  1948  . VASECTOMY       Prescriptions Prior to Admission  Medication Sig Dispense Refill Last Dose  . ALPRAZolam (XANAX) 0.25 MG tablet Take 0.25 mg by mouth at bedtime as needed for sleep.  2 03/09/2017 at 0500  . apixaban (ELIQUIS) 2.5 MG TABS tablet Take 1 tablet (2.5 mg total) by mouth 2 (two) times daily. 60 tablet 2 03/08/2017 at 1800  . aspirin EC 81 MG EC  tablet Take 1 tablet (81 mg total) by mouth daily. 30 tablet 2 03/08/2017 at 0800  . atorvastatin (LIPITOR) 10 MG tablet Take 10 mg by mouth daily.    03/08/2017 at 0800  . carvedilol (COREG) 3.125 MG tablet Take 3.125 mg by mouth 2 (two) times daily with a meal.  11 03/08/2017 at 2000  . ENTRESTO 24-26 MG Take 1 tablet by mouth every 12 (twelve) hours.  3 03/08/2017 at 2000  . furosemide (LASIX) 20 MG tablet Take 1 tablet (20 mg total) by mouth 2 (two) times daily. (Patient taking differently: Take 40 mg by mouth daily. ) 60 tablet 2 03/08/2017 at 0800  . KLOR-CON M10 10 MEQ tablet Take 10 mEq by mouth daily.  11 03/08/2017 at 0800  . tamsulosin (FLOMAX) 0.4 MG CAPS capsule Take 0.4 mg by mouth. In am.   03/08/2017 at 0800  . betamethasone dipropionate (DIPROLENE) 0.05 % cream Apply topically 2 (two) times daily. (Patient not taking: Reported on 02/15/2016) 30 g 0 Completed Course at Unknown time  . betamethasone dipropionate (DIPROLENE) 0.05 % cream Apply topically 2 (two) times daily. (Patient not taking: Reported on 08/23/2016) 30 g 0 Completed Course at Unknown time  . docusate sodium (COLACE) 100 MG capsule Take 1 capsule (100 mg total) by mouth 2 (two) times daily. (Patient not taking: Reported on 02/15/2016) 60 capsule 0 Not Taking at Unknown time    Physical Exam: Blood pressure (!) 80/67, pulse (!) 41, temperature 97.6 F (36.4 C), temperature source Oral, resp. rate (!) 33, height 5\' 9"  (1.753 m), weight 68.2 kg (150 lb 5.7 oz), SpO2 99 %.   Wt Readings from Last 1 Encounters:  03/10/17 68.2 kg (150 lb 5.7 oz)     General appearance: alert and cooperative Head: Normocephalic, without obvious abnormality, atraumatic Resp: clear to auscultation bilaterally Chest wall: no tenderness Cardio: irregularly irregular rhythm GI: abnormal findings:  mild tenderness in the periumbilical area Neurologic: Grossly normal  Labs:   Lab Results  Component Value Date   WBC 5.3 03/10/2017   HGB  12.0 (L) 03/10/2017   HCT 35.9 (L) 03/10/2017   MCV 95.8 03/10/2017   PLT 109 (L) 03/10/2017    Recent Labs Lab 03/09/17 1634 03/10/17 0521  NA 141 141  K 4.6 4.5  CL 100* 103  CO2 26 26  BUN 56* 71*  CREATININE 2.59* 3.00*  CALCIUM 9.9 9.0  PROT 8.3*  --   BILITOT 2.7*  --   ALKPHOS 105  --   ALT 23  --   AST 38  --   GLUCOSE 141* 149*   Lab Results  Component Value Date   CKTOTAL 157 05/28/2012   CKMB 4.3 (H) 05/28/2012   TROPONINI 0.05 (HH) 03/09/2017       EKG: afib   ASSESSMENT AND PLAN:  83 yo male with history of cardiomyopathy with ef of 10-15% admitted  with sbo with nausea and vomiting. Apparently had one episode of posible coffee ground emiesis. Is improved at present but remains with afib with rvr on amiodarone drip. Was taken off of apixiban that he was on as an outpatient due to possible bleeding and possible surgery for sbo. Currently does not appear to require surgery. NG is removed and he is tolerating clears.  Will continue with ivamidarone and agree with careful rehydreation as he appears somewhat volume de;leted and clnically and radiographically, does not appear volume overloaded. Will add back entresto and carvedilol as pressure allows. Will hold apixiban for now. Will follow with you.  Signed: Teodoro Spray MD, [facility] 03/10/2017, 10:46 AM

## 2017-03-10 NOTE — Progress Notes (Signed)
[place] [facility] [place] NAME: Ruben Hudson    MR#:  [id number]  DATE OF BIRTH:  08-01-1925  SUBJECTIVE:    patient with better abdominal pain ventral hernia was reduced Still not passing flatulus  REVIEW OF SYSTEMS:    Review of Systems  Constitutional: Negative for fever, chills weight loss HENT: Negative for ear pain, nosebleeds, congestion, facial swelling, rhinorrhea, neck pain, neck stiffness and ear discharge.   Respiratory: Negative for cough, shortness of breath, wheezing  Cardiovascular: Negative for chest pain, palpitations and leg swelling.  Gastrointestinal: Negative for heartburn, abdominal pain, vomiting, diarrhea or consitpation Genitourinary: Negative for dysuria, urgency, frequency, hematuria Musculoskeletal: Negative for back pain or joint pain Neurological: Negative for dizziness, seizures, syncope, focal weakness,  numbness and headaches.  Hematological: Does not bruise/bleed easily.  Psychiatric/Behavioral: Negative for hallucinations, confusion, dysphoric mood    Tolerating Diet: nPO      DRUG ALLERGIES:  No Known Allergies  VITALS:  Blood pressure (!) 80/67, pulse (!) 41, temperature 97.6 F (36.4 C), temperature source Oral, resp. rate (!) 33, height 5\' 9"  (1.753 m), weight 68.2 kg (150 lb 5.7 oz), SpO2 99 %.  PHYSICAL EXAMINATION:  Constitutional: Appears well-developed and well-nourished. No distress. HENT: Normocephalic. Marland Kitchen Oropharynx is clear and moist.  Eyes: Conjunctivae and EOM are normal. PERRLA, no scleral icterus.  Neck: Normal ROM. Neck supple. No JVD. No tracheal deviation. CVS: IRR, IRR, S1/S2 +,2/6 SEM no gallops, no carotid bruit.  Pulmonary: Effort and breath sounds normal, no stridor, rhonchi, wheezes, rales.  Abdominal: Soft., + vental hernia palpable good bowel sounds no distension, tenderness, rebound or guarding.  Musculoskeletal: Normal range of motion. No edema and no tenderness.   Neuro: Alert. CN 2-12 grossly intact. No focal deficits. Skin: Skin is warm and dry. No rash noted. Psychiatric: Normal mood and affect.      LABORATORY PANEL:   CBC  Recent Labs Lab 03/10/17 0521  WBC 5.3  HGB 12.0*  HCT 35.9*  PLT 109*   ------------------------------------------------------------------------------------------------------------------  Chemistries   Recent Labs Lab 03/09/17 1634 03/10/17 0521  NA 141 141  K 4.6 4.5  CL 100* 103  CO2 26 26  GLUCOSE 141* 149*  BUN 56* 71*  CREATININE 2.59* 3.00*  CALCIUM 9.9 9.0  MG 2.6*  --   AST 38  --   ALT 23  --   ALKPHOS 105  --   BILITOT 2.7*  --    ------------------------------------------------------------------------------------------------------------------  Cardiac Enzymes  Recent Labs Lab 03/09/17 1634  TROPONINI 0.05*   ------------------------------------------------------------------------------------------------------------------  RADIOLOGY:  Ct Abdomen Pelvis Wo Contrast  Result Date: 03/09/2017 CLINICAL DATA:  83 y/o M; lower abdominal pain with 1 episode of vomiting and coffee-ground emesis. EXAM: CT ABDOMEN AND PELVIS WITHOUT CONTRAST TECHNIQUE: Multidetector CT imaging of the abdomen and pelvis was performed following the standard protocol without IV contrast. COMPARISON:  03/29/2010 CT of the abdomen and pelvis. FINDINGS: Lower chest: Mild coronary artery calcification. Mitral annular calcification. Mild cardiomegaly. Small bilateral pleural effusions. Hepatobiliary: No focal liver abnormality is seen. Mild decreased attenuation of liver. No gallstones, gallbladder wall thickening, or biliary dilatation. Pancreas: Unremarkable. No pancreatic ductal dilatation or surrounding inflammatory changes. Spleen: Normal in size without focal abnormality. Adrenals/Urinary Tract: Normal adrenal glands. Punctate densities in the left kidney may represent nonobstructing nephrolithiasis. No  hydronephrosis. Normal bladder. Stomach/Bowel: Small bowel obstruction with transition of bowel caliber and a small paraumbilical hernia where there is herniation of small bowel.  The hernia neck measures 14 mm (series 6, image 61 and series 2 image 50 [id number]). Normal colon. Normal stomach. Vascular/Lymphatic: Aortic atherosclerosis. No enlarged abdominal or pelvic lymph nodes. Reproductive: Prostate enlargement. Other: Small volume of ascites. Musculoskeletal: No fracture is seen. Multilevel degenerative changes of the spine greatest at L5-S1 where there is moderate disc space narrowing. IMPRESSION: 1. Small bowel obstruction with transition in bowel caliber at a small paraumbilical hernia where there is herniation of a short loop of small bowel. 2. Small volume of ascites. 3. Small bilateral pleural effusions. 4. Cardiomegaly.  Coronary and aortic calcific atherosclerosis. 5. Mild decrease in liver attenuation may represent steatosis or vascular congestion from heart failure. Electronically Signed   By: Kristine Garbe M.D.   On: 03/09/2017 17:40   Dg Abd 1 View  Result Date: 03/09/2017 CLINICAL DATA:  Nasogastric tube placement. EXAM: ABDOMEN - 1 VIEW COMPARISON:  Abdominal CT 03/09/2017 FINDINGS: Enteric catheter projects over the mid upper abdomen, tip in uncertain location. Abdominal ascites. Bilateral pleural effusions. IMPRESSION: Uncertain position of the enteric catheter. Electronically Signed   By: Fidela Salisbury M.D.   On: 03/09/2017 19:05   Dg Chest Port 1 View  Result Date: 03/10/2017 CLINICAL DATA:  Respiratory failure EXAM: PORTABLE CHEST 1 VIEW COMPARISON:  03/09/2017 FINDINGS: Layering bilateral effusions with bibasilar atelectasis, stable. Cardiomegaly with vascular congestion. Mild cardiomegaly. No change since prior study. IMPRESSION: Continued layering bilateral effusions and bibasilar atelectasis. Stable cardiomegaly, vascular congestion. Electronically Signed   By:  Rolm Baptise M.D.   On: 03/10/2017 10:17   Dg Chest Portable 1 View  Result Date: 03/09/2017 CLINICAL DATA:  83 y/o  M; abdominal pain and mild hypoxia. EXAM: PORTABLE CHEST 1 VIEW COMPARISON:  08/23/2016 chest radiograph FINDINGS: Stable cardiomegaly given projection and technique. Aortic atherosclerosis with calcification. Small bilateral pleural effusions and bibasilar opacities. Pulmonary vascular congestion. No acute osseous abnormality is evident. IMPRESSION: Stable cardiomegaly. Small bilateral pleural effusions and bibasilar opacities probably representing associated atelectasis. Pulmonary vascular congestion. Electronically Signed   By: Kristine Garbe M.D.   On: 03/09/2017 17:47   Dg Abd Portable 1 View  Result Date: 03/09/2017 CLINICAL DATA:  Nasogastric tube placement EXAM: PORTABLE ABDOMEN - 1 VIEW COMPARISON:  None. FINDINGS: The nasogastric tube extends into the stomach with tip in the region of the mid gastric body. IMPRESSION: Nasogastric tube extends into the stomach. Electronically Signed   By: Andreas Newport M.D.   On: 03/09/2017 21:19   Dg Abd Portable 2v  Result Date: 03/10/2017 CLINICAL DATA:  Followup small bowel obstruction. EXAM: PORTABLE ABDOMEN - 2 VIEW COMPARISON:  04/09/2017 FINDINGS: Nasogastric tube tip is again seen overlying the body the stomach. Several gas-filled small bowel loops are seen within the left abdomen, with mild dilatation measuring up to 3 cm. Paucity of colonic gas is seen. This is consistent with a low-grade distal small bowel obstruction. No evidence of free intraperitoneal air. Bilateral pleural effusions again noted. IMPRESSION: Mildly dilated small bowel loops, suspicious for distal small bowel obstruction and without significant change. Stable bilateral pleural effusions. Electronically Signed   By: Earle Gell M.D.   On: 03/10/2017 09:18     ASSESSMENT AND PLAN:   83 y/o make with PAF, CKD stage 3 and HTN presents with  abdominal pain and found to have small bowel obstruction with [name] hernia.  1. Small bowel obstruction with resolved incarceration from umbilical hernia Surgery consultation appreciated Abdomen is soft and obstruction has resolved Management as per  surgery  2. Rapid atrial fibrillation: Heart rate has improved on amiodarone and Coreg Follow up on cardiology consultation Does not sound like patient will need surgery and therefore can likely be restarted on anticoagulation perhaps in a.m.  3. Acute on chronic systolic heart failure with ejection fraction less than 15% by last echo in March Watch for fluid overload Monitor intake and output with daily weight Cardiology consultation requested Patient appears to be euvolemic and therefore no need for continuing IV Lasix.  4. BPH: Continue Flomax  5. Elevated troponin with baseline chronic elevation troponin Patient has ruled out for ACS.  6.acute on chronic kidney disease stage III: Creatinine has increased in the setting of above issues Will repeat BMP in a.m. Consider renal consultation if creatinine continues to rise  Management plans discussed with the patient and wife and they are in agreement.  CODE STATUS: FULl  TOTAL TIME TAKING CARE OF THIS PATIENT: 30 minutes.   Discussed with Dr. Alva Garnet.  POSSIBLE D/C 2-4 days, DEPENDING ON CLINICAL CONDITION.   Ruben Hudson M.D on 03/10/2017 at 10:37 AM  Between 7am to 6pm - Pager - [phone] After 6pm go to [email or web address] - password [email or web address] [place] [facility]  Office  [phone]  CC: Primary care physician; Ruben Cowman, MD  Note: This dictation was prepared with Dragon dictation along with smaller phrase technology. Any transcriptional errors that result from this process are unintentional.

## 2017-03-10 NOTE — Progress Notes (Signed)
CC: incarcerated [facility] Subjective: No abdominal pain A fib NGT 125cc CT scan personally reviewed, now KUB reviewed p. As well. No free air, mild dilation SB, air in rectum  Objective: Vital signs in last 24 hours: Temp:  [97.6 F (36.4 C)] 97.6 F (36.4 C) (10/14 1626) Pulse Rate:  [31-135] 90 (10/15 0757) Resp:  [22-33] 29 (10/15 0700) BP: (68-126)/(48-96) 89/68 (10/15 0757) SpO2:  [85 %-100 %] 100 % (10/15 0700) Weight:  [68.2 kg (150 lb 5.7 oz)-68.9 kg (152 lb)] 68.2 kg (150 lb 5.7 oz) (10/15 0500) Last BM Date: 03/07/17  Intake/Output from previous day: 10/14 0701 - 10/15 0700 In: -  Out: 350 [Urine:225; Emesis/NG output:125] Intake/Output this shift: No intake/output data recorded.  Physical exam: NAD, pulling at tubes Abd: soft, reducible [facility], no peritontiis Ext: edema, well perfused  Lab Results: CBC   Recent Labs  03/09/17 1634 03/10/17 0521  WBC 6.4 5.3  HGB 13.8 12.0*  HCT 41.9 35.9*  PLT 140* 109*   BMET  Recent Labs  03/09/17 1634 03/10/17 0521  NA 141 141  K 4.6 4.5  CL 100* 103  CO2 26 26  GLUCOSE 141* 149*  BUN 56* 71*  CREATININE 2.59* 3.00*  CALCIUM 9.9 9.0   PT/INR  Recent Labs  03/09/17 1654 03/10/17 0805  LABPROT 16.0* 16.6*  INR 1.29 1.35   ABG  Recent Labs  03/09/17 1855  PHART 7.37  HCO3 24.9    Studies/Results: Ct Abdomen Pelvis Wo Contrast  Result Date: 03/09/2017 CLINICAL DATA:  83 y/o M; lower abdominal pain with 1 episode of vomiting and coffee-ground emesis. EXAM: CT ABDOMEN AND PELVIS WITHOUT CONTRAST TECHNIQUE: Multidetector CT imaging of the abdomen and pelvis was performed following the standard protocol without IV contrast. COMPARISON:  03/29/2010 CT of the abdomen and pelvis. FINDINGS: Lower chest: Mild coronary artery calcification. Mitral annular calcification. Mild cardiomegaly. Small bilateral pleural effusions. Hepatobiliary: No focal liver abnormality is seen. Mild decreased attenuation of liver. No  gallstones, gallbladder wall thickening, or biliary dilatation. Pancreas: Unremarkable. No pancreatic ductal dilatation or surrounding inflammatory changes. Spleen: Normal in size without focal abnormality. Adrenals/Urinary Tract: Normal adrenal glands. Punctate densities in the left kidney may represent nonobstructing nephrolithiasis. No hydronephrosis. Normal bladder. Stomach/Bowel: Small bowel obstruction with transition of bowel caliber and a small paraumbilical hernia where there is herniation of small bowel. The hernia neck measures 14 mm (series 6, image 61 and series 2 image 50 [id number]). Normal colon. Normal stomach. Vascular/Lymphatic: Aortic atherosclerosis. No enlarged abdominal or pelvic lymph nodes. Reproductive: Prostate enlargement. Other: Small volume of ascites. Musculoskeletal: No fracture is seen. Multilevel degenerative changes of the spine greatest at L5-S1 where there is moderate disc space narrowing. IMPRESSION: 1. Small bowel obstruction with transition in bowel caliber at a small paraumbilical hernia where there is herniation of a short loop of small bowel. 2. Small volume of ascites. 3. Small bilateral pleural effusions. 4. Cardiomegaly.  Coronary and aortic calcific atherosclerosis. 5. Mild decrease in liver attenuation may represent steatosis or vascular congestion from heart failure. Electronically Signed   By: Kristine Garbe M.D.   On: 03/09/2017 17:40   Dg Abd 1 View  Result Date: 03/09/2017 CLINICAL DATA:  Nasogastric tube placement. EXAM: ABDOMEN - 1 VIEW COMPARISON:  Abdominal CT 03/09/2017 FINDINGS: Enteric catheter projects over the mid upper abdomen, tip in uncertain location. Abdominal ascites. Bilateral pleural effusions. IMPRESSION: Uncertain position of the enteric catheter. Electronically Signed   By: Linwood Dibbles.D.  On: 03/09/2017 19:05   Dg Chest Portable 1 View  Result Date: 03/09/2017 CLINICAL DATA:  83 y/o  M; abdominal pain and mild  hypoxia. EXAM: PORTABLE CHEST 1 VIEW COMPARISON:  08/23/2016 chest radiograph FINDINGS: Stable cardiomegaly given projection and technique. Aortic atherosclerosis with calcification. Small bilateral pleural effusions and bibasilar opacities. Pulmonary vascular congestion. No acute osseous abnormality is evident. IMPRESSION: Stable cardiomegaly. Small bilateral pleural effusions and bibasilar opacities probably representing associated atelectasis. Pulmonary vascular congestion. Electronically Signed   By: Kristine Garbe M.D.   On: 03/09/2017 17:47   Dg Abd Portable 1 View  Result Date: 03/09/2017 CLINICAL DATA:  Nasogastric tube placement EXAM: PORTABLE ABDOMEN - 1 VIEW COMPARISON:  None. FINDINGS: The nasogastric tube extends into the stomach with tip in the region of the mid gastric body. IMPRESSION: Nasogastric tube extends into the stomach. Electronically Signed   By: Andreas Newport M.D.   On: 03/09/2017 21:19   Dg Abd Portable 2v  Result Date: 03/10/2017 CLINICAL DATA:  Followup small bowel obstruction. EXAM: PORTABLE ABDOMEN - 2 VIEW COMPARISON:  04/09/2017 FINDINGS: Nasogastric tube tip is again seen overlying the body the stomach. Several gas-filled small bowel loops are seen within the left abdomen, with mild dilatation measuring up to 3 cm. Paucity of colonic gas is seen. This is consistent with a low-grade distal small bowel obstruction. No evidence of free intraperitoneal air. Bilateral pleural effusions again noted. IMPRESSION: Mildly dilated small bowel loops, suspicious for distal small bowel obstruction and without significant change. Stable bilateral pleural effusions. Electronically Signed   By: Earle Gell M.D.   On: 03/10/2017 09:18    Anti-infectives: Anti-infectives    None      Assessment/Plan: Resolved incarceration from [facility] PT multiple issues including A fib w RVR, anasarca, CHF, anticoagulated and now delirium D/W family in detail, we will avoid surgical  intervention as this will carry significant mortality and morbidity.  His abdomen is soft and his obstruction is resolving D/W ICU team in detail I spent 35 min in this encounter w > 50% spent in coordination and counseling of his care.  Caroleen Hamman, MD, [facility]  03/10/2017

## 2017-03-11 LAB — BASIC METABOLIC PANEL
Anion gap: 10 (ref 5–15)
BUN: 84 mg/dL — AB (ref 6–20)
CHLORIDE: 102 mmol/L (ref 101–111)
CO2: 26 mmol/L (ref 22–32)
Calcium: 8.7 mg/dL — ABNORMAL LOW (ref 8.9–10.3)
Creatinine, Ser: 3.98 mg/dL — ABNORMAL HIGH (ref 0.61–1.24)
GFR calc Af Amer: 14 mL/min — ABNORMAL LOW (ref >60)
GFR, EST NON AFRICAN AMERICAN: 12 mL/min — AB (ref >60)
Glucose, Bld: 106 mg/dL — ABNORMAL HIGH (ref 65–99)
POTASSIUM: 4.4 mmol/L (ref 3.5–5.1)
SODIUM: 138 mmol/L (ref 135–145)

## 2017-03-11 LAB — CBC
HCT: 38.2 % — ABNORMAL LOW (ref 40.0–52.0)
HEMOGLOBIN: 12.8 g/dL — AB (ref 13.0–18.0)
MCH: 32.1 pg (ref 26.0–34.0)
MCHC: 33.5 g/dL (ref 32.0–36.0)
MCV: 95.9 fL (ref 80.0–100.0)
Platelets: 104 10*3/uL — ABNORMAL LOW (ref 150–440)
RBC: 3.99 MIL/uL — AB (ref 4.40–5.90)
RDW: 15.8 % — ABNORMAL HIGH (ref 11.5–14.5)
WBC: 5.8 10*3/uL (ref 3.8–10.6)

## 2017-03-11 NOTE — Progress Notes (Signed)
[facility] NAME: Ruben Hudson    MR#:  [id number]  DATE OF BIRTH:  1926-05-27  SUBJECTIVE:    patient tolerating clear liquids today Wife at bedside  REVIEW OF SYSTEMS:    Review of Systems  Constitutional: Negative for fever, chills weight loss HENT: Negative for ear pain, nosebleeds, congestion, facial swelling, rhinorrhea, neck pain, neck stiffness and ear discharge.   Respiratory: Negative for cough, shortness of breath, wheezing  Cardiovascular: Negative for chest pain, palpitations and leg swelling.  Gastrointestinal: Negative for heartburn, abdominal pain, vomiting, diarrhea or consitpation Genitourinary: Negative for dysuria, urgency, frequency, hematuria Musculoskeletal: Negative for back pain or joint pain Neurological: Negative for dizziness, seizures, syncope, focal weakness,  numbness and headaches.  Hematological: Does not bruise/bleed easily.  Psychiatric/Behavioral: Negative for hallucinations, confusion, dysphoric mood    Tolerating Diet: clear liquid diet     DRUG ALLERGIES:  No Known Allergies  VITALS:  Blood pressure (!) 93/55, pulse 97, temperature 98.3 F (36.8 C), temperature source Oral, resp. rate 16, height 5\' 9"  (1.753 m), weight 70.6 kg (155 lb 10.3 oz), SpO2 100 %.  PHYSICAL EXAMINATION:  Constitutional: Appears well-developed and well-nourished. No distress. HENT: Normocephalic. Marland Kitchen Oropharynx is clear and moist.  Eyes: Conjunctivae and EOM are normal. PERRLA, no scleral icterus.  Neck: Normal ROM. Neck supple. No JVD. No tracheal deviation. CVS: IRR, IRR, S1/S2 +,2/6 SEM no gallops, no carotid bruit.  Pulmonary: Effort and breath sounds normal, no stridor, rhonchi, wheezes, rales.  Abdominal: Soft., + vental hernia palpable good bowel sounds no distension, tenderness, rebound or guarding.  Musculoskeletal: Normal range of motion. No edema and no tenderness.  Neuro: Alert. CN 2-12 grossly  intact. No focal deficits. Skin: Skin is warm and dry. No rash noted. Psychiatric: Normal mood and affect.      LABORATORY PANEL:   CBC  Recent Labs Lab 03/11/17 0340  WBC 5.8  HGB 12.8*  HCT 38.2*  PLT 104*   ------------------------------------------------------------------------------------------------------------------  Chemistries   Recent Labs Lab 03/09/17 1634  03/11/17 0340  NA 141  < > 138  K 4.6  < > 4.4  CL 100*  < > 102  CO2 26  < > 26  GLUCOSE 141*  < > 106*  BUN 56*  < > 84*  CREATININE 2.59*  < > 3.98*  CALCIUM 9.9  < > 8.7*  MG 2.6*  --   --   AST 38  --   --   ALT 23  --   --   ALKPHOS 105  --   --   BILITOT 2.7*  --   --   < > = values in this interval not displayed. ------------------------------------------------------------------------------------------------------------------  Cardiac Enzymes  Recent Labs Lab 03/09/17 1634  TROPONINI 0.05*   ------------------------------------------------------------------------------------------------------------------  RADIOLOGY:  Ct Abdomen Pelvis Wo Contrast  Result Date: 03/09/2017 CLINICAL DATA:  83 y/o M; lower abdominal pain with 1 episode of vomiting and coffee-ground emesis. EXAM: CT ABDOMEN AND PELVIS WITHOUT CONTRAST TECHNIQUE: Multidetector CT imaging of the abdomen and pelvis was performed following the standard protocol without IV contrast. COMPARISON:  03/29/2010 CT of the abdomen and pelvis. FINDINGS: Lower chest: Mild coronary artery calcification. Mitral annular calcification. Mild cardiomegaly. Small bilateral pleural effusions. Hepatobiliary: No focal liver abnormality is seen. Mild decreased attenuation of liver. No gallstones, gallbladder wall thickening, or biliary dilatation. Pancreas: Unremarkable. No pancreatic ductal dilatation or surrounding inflammatory changes. Spleen: Normal in size without focal  abnormality. Adrenals/Urinary Tract: Normal adrenal glands. Punctate densities  in the left kidney may represent nonobstructing nephrolithiasis. No hydronephrosis. Normal bladder. Stomach/Bowel: Small bowel obstruction with transition of bowel caliber and a small paraumbilical hernia where there is herniation of small bowel. The hernia neck measures 14 mm (series 6, image 61 and series 2 image 50 07-1953). Normal colon. Normal stomach. Vascular/Lymphatic: Aortic atherosclerosis. No enlarged abdominal or pelvic lymph nodes. Reproductive: Prostate enlargement. Other: Small volume of ascites. Musculoskeletal: No fracture is seen. Multilevel degenerative changes of the spine greatest at L5-S1 where there is moderate disc space narrowing. IMPRESSION: 1. Small bowel obstruction with transition in bowel caliber at a small paraumbilical hernia where there is herniation of a short loop of small bowel. 2. Small volume of ascites. 3. Small bilateral pleural effusions. 4. Cardiomegaly.  Coronary and aortic calcific atherosclerosis. 5. Mild decrease in liver attenuation may represent steatosis or vascular congestion from heart failure. Electronically Signed   By: Kristine Garbe M.D.   On: 03/09/2017 17:40   Dg Abd 1 View  Result Date: 03/09/2017 CLINICAL DATA:  Nasogastric tube placement. EXAM: ABDOMEN - 1 VIEW COMPARISON:  Abdominal CT 03/09/2017 FINDINGS: Enteric catheter projects over the mid upper abdomen, tip in uncertain location. Abdominal ascites. Bilateral pleural effusions. IMPRESSION: Uncertain position of the enteric catheter. Electronically Signed   By: Fidela Salisbury M.D.   On: 03/09/2017 19:05   Dg Chest Port 1 View  Result Date: 03/10/2017 CLINICAL DATA:  Respiratory failure EXAM: PORTABLE CHEST 1 VIEW COMPARISON:  03/09/2017 FINDINGS: Layering bilateral effusions with bibasilar atelectasis, stable. Cardiomegaly with vascular congestion. Mild cardiomegaly. No change since prior study. IMPRESSION: Continued layering bilateral effusions and bibasilar atelectasis.  Stable cardiomegaly, vascular congestion. Electronically Signed   By: Rolm Baptise M.D.   On: 03/10/2017 10:17   Dg Chest Portable 1 View  Result Date: 03/09/2017 CLINICAL DATA:  83 y/o  M; abdominal pain and mild hypoxia. EXAM: PORTABLE CHEST 1 VIEW COMPARISON:  08/23/2016 chest radiograph FINDINGS: Stable cardiomegaly given projection and technique. Aortic atherosclerosis with calcification. Small bilateral pleural effusions and bibasilar opacities. Pulmonary vascular congestion. No acute osseous abnormality is evident. IMPRESSION: Stable cardiomegaly. Small bilateral pleural effusions and bibasilar opacities probably representing associated atelectasis. Pulmonary vascular congestion. Electronically Signed   By: Kristine Garbe M.D.   On: 03/09/2017 17:47   Dg Abd Portable 1 View  Result Date: 03/09/2017 CLINICAL DATA:  Nasogastric tube placement EXAM: PORTABLE ABDOMEN - 1 VIEW COMPARISON:  None. FINDINGS: The nasogastric tube extends into the stomach with tip in the region of the mid gastric body. IMPRESSION: Nasogastric tube extends into the stomach. Electronically Signed   By: Andreas Newport M.D.   On: 03/09/2017 21:19   Dg Abd Portable 2v  Result Date: 03/10/2017 CLINICAL DATA:  Followup small bowel obstruction. EXAM: PORTABLE ABDOMEN - 2 VIEW COMPARISON:  04/09/2017 FINDINGS: Nasogastric tube tip is again seen overlying the body the stomach. Several gas-filled small bowel loops are seen within the left abdomen, with mild dilatation measuring up to 3 cm. Paucity of colonic gas is seen. This is consistent with a low-grade distal small bowel obstruction. No evidence of free intraperitoneal air. Bilateral pleural effusions again noted. IMPRESSION: Mildly dilated small bowel loops, suspicious for distal small bowel obstruction and without significant change. Stable bilateral pleural effusions. Electronically Signed   By: Earle Gell M.D.   On: 03/10/2017 09:18     ASSESSMENT AND  PLAN:   83 y/o make with PAF, CKD stage  3 and HTN presents with abdominal pain and found to have small bowel obstruction with [name] hernia.  1. Small bowel obstruction with resolved incarceration from umbilical hernia Surgery consultation appreciated Abdomen is soft and obstruction has resolved Management as per surgery Does not appear the patient will need to undergo surgery  2. Rapid atrial fibrillation: Heart rate has improved on amiodarone and Coreg Patient may possibly be transitioned to oral amiodarone Consider restarting anticoagulation today or tomorrow   3. Acute on chronic systolic heart failure with ejection fraction less than 10-15% by last echo in March Watch for fluid overload Monitor intake and output with daily weight Cardiology consultation Appreciated Patient appears to be euvolemic and therefore no need for continuing IV Lasix.  4. BPH: Continue Flomax  5. Elevated troponin with baseline chronic elevation troponin Patient has ruled out for ACS.  6.acute on chronic kidney disease stage III: Creatinine has increased in the setting of above issues Nephrology consultation pending BMP for a.m. Hold nephrotoxic medications  Physical therapy consultation for discharge planningrequested.   Management plans discussed with the patient and wife and they are in agreement.  CODE STATUS: FULl  TOTAL TIME TAKING CARE OF THIS PATIENT: 21 minutes.    POSSIBLE D/C 2-3 days, DEPENDING ON CLINICAL CONDITION.   Blade Scheff M.D on 03/11/2017 at 12:17 PM  Between 7am to 6pm - Pager - [phone] After 6pm go to [email or web address] - password [email or web address] [facility]  Office  [phone]  CC: Primary care physician; Isaias Cowman, MD  Note: This dictation was prepared with Dragon dictation along with smaller phrase technology. Any transcriptional errors that result from this process are unintentional.

## 2017-03-11 NOTE — Progress Notes (Signed)
CC: [facility] Subjective: Taking clears, had some flatus No abd pain  Objective: Vital signs in last 24 hours: Temp:  [98.3 F (36.8 C)] 98.3 F (36.8 C) (10/15 1200) Pulse Rate:  [39-118] 118 (10/16 0827) Resp:  [13-42] 16 (10/16 0827) BP: (67-105)/(35-82) 93/55 (10/16 0827) SpO2:  [84 %-100 %] 100 % (10/16 0827) Weight:  [70.6 kg (155 lb 10.3 oz)] 70.6 kg (155 lb 10.3 oz) (10/16 0500) Last BM Date: 03/07/17  Intake/Output from previous day: 10/15 0701 - 10/16 0700 In: 381.7 [P.O.:365; I.V.:16.7] Out: 100 [Urine:100] Intake/Output this shift: No intake/output data recorded.  Physical exam: NAD Abd: soft, NT, reducible [facility]   Lab Results: CBC   Recent Labs  03/10/17 0521 03/11/17 0340  WBC 5.3 5.8  HGB 12.0* 12.8*  HCT 35.9* 38.2*  PLT 109* 104*   BMET  Recent Labs  03/10/17 0521 03/11/17 0340  NA 141 138  K 4.5 4.4  CL 103 102  CO2 26 26  GLUCOSE 149* 106*  BUN 71* 84*  CREATININE 3.00* 3.98*  CALCIUM 9.0 8.7*   PT/INR  Recent Labs  03/09/17 1654 03/10/17 0805  LABPROT 16.0* 16.6*  INR 1.29 1.35   ABG  Recent Labs  03/09/17 1855  PHART 7.37  HCO3 24.9    Studies/Results: Ct Abdomen Pelvis Wo Contrast  Result Date: 03/09/2017 CLINICAL DATA:  83 y/o M; lower abdominal pain with 1 episode of vomiting and coffee-ground emesis. EXAM: CT ABDOMEN AND PELVIS WITHOUT CONTRAST TECHNIQUE: Multidetector CT imaging of the abdomen and pelvis was performed following the standard protocol without IV contrast. COMPARISON:  03/29/2010 CT of the abdomen and pelvis. FINDINGS: Lower chest: Mild coronary artery calcification. Mitral annular calcification. Mild cardiomegaly. Small bilateral pleural effusions. Hepatobiliary: No focal liver abnormality is seen. Mild decreased attenuation of liver. No gallstones, gallbladder wall thickening, or biliary dilatation. Pancreas: Unremarkable. No pancreatic ductal dilatation or surrounding inflammatory changes. Spleen: Normal in  size without focal abnormality. Adrenals/Urinary Tract: Normal adrenal glands. Punctate densities in the left kidney may represent nonobstructing nephrolithiasis. No hydronephrosis. Normal bladder. Stomach/Bowel: Small bowel obstruction with transition of bowel caliber and a small paraumbilical hernia where there is herniation of small bowel. The hernia neck measures 14 mm (series 6, image 61 and series 2 image 50 07-1953). Normal colon. Normal stomach. Vascular/Lymphatic: Aortic atherosclerosis. No enlarged abdominal or pelvic lymph nodes. Reproductive: Prostate enlargement. Other: Small volume of ascites. Musculoskeletal: No fracture is seen. Multilevel degenerative changes of the spine greatest at L5-S1 where there is moderate disc space narrowing. IMPRESSION: 1. Small bowel obstruction with transition in bowel caliber at a small paraumbilical hernia where there is herniation of a short loop of small bowel. 2. Small volume of ascites. 3. Small bilateral pleural effusions. 4. Cardiomegaly.  Coronary and aortic calcific atherosclerosis. 5. Mild decrease in liver attenuation may represent steatosis or vascular congestion from heart failure. Electronically Signed   By: Kristine Garbe M.D.   On: 03/09/2017 17:40   Dg Abd 1 View  Result Date: 03/09/2017 CLINICAL DATA:  Nasogastric tube placement. EXAM: ABDOMEN - 1 VIEW COMPARISON:  Abdominal CT 03/09/2017 FINDINGS: Enteric catheter projects over the mid upper abdomen, tip in uncertain location. Abdominal ascites. Bilateral pleural effusions. IMPRESSION: Uncertain position of the enteric catheter. Electronically Signed   By: Fidela Salisbury M.D.   On: 03/09/2017 19:05   Dg Chest Port 1 View  Result Date: 03/10/2017 CLINICAL DATA:  Respiratory failure EXAM: PORTABLE CHEST 1 VIEW COMPARISON:  03/09/2017 FINDINGS: Layering bilateral  effusions with bibasilar atelectasis, stable. Cardiomegaly with vascular congestion. Mild cardiomegaly. No change  since prior study. IMPRESSION: Continued layering bilateral effusions and bibasilar atelectasis. Stable cardiomegaly, vascular congestion. Electronically Signed   By: Rolm Baptise M.D.   On: 03/10/2017 10:17   Dg Chest Portable 1 View  Result Date: 03/09/2017 CLINICAL DATA:  83 y/o  M; abdominal pain and mild hypoxia. EXAM: PORTABLE CHEST 1 VIEW COMPARISON:  08/23/2016 chest radiograph FINDINGS: Stable cardiomegaly given projection and technique. Aortic atherosclerosis with calcification. Small bilateral pleural effusions and bibasilar opacities. Pulmonary vascular congestion. No acute osseous abnormality is evident. IMPRESSION: Stable cardiomegaly. Small bilateral pleural effusions and bibasilar opacities probably representing associated atelectasis. Pulmonary vascular congestion. Electronically Signed   By: Kristine Garbe M.D.   On: 03/09/2017 17:47   Dg Abd Portable 1 View  Result Date: 03/09/2017 CLINICAL DATA:  Nasogastric tube placement EXAM: PORTABLE ABDOMEN - 1 VIEW COMPARISON:  None. FINDINGS: The nasogastric tube extends into the stomach with tip in the region of the mid gastric body. IMPRESSION: Nasogastric tube extends into the stomach. Electronically Signed   By: Andreas Newport M.D.   On: 03/09/2017 21:19   Dg Abd Portable 2v  Result Date: 03/10/2017 CLINICAL DATA:  Followup small bowel obstruction. EXAM: PORTABLE ABDOMEN - 2 VIEW COMPARISON:  04/09/2017 FINDINGS: Nasogastric tube tip is again seen overlying the body the stomach. Several gas-filled small bowel loops are seen within the left abdomen, with mild dilatation measuring up to 3 cm. Paucity of colonic gas is seen. This is consistent with a low-grade distal small bowel obstruction. No evidence of free intraperitoneal air. Bilateral pleural effusions again noted. IMPRESSION: Mildly dilated small bowel loops, suspicious for distal small bowel obstruction and without significant change. Stable bilateral pleural  effusions. Electronically Signed   By: Earle Gell M.D.   On: 03/10/2017 09:18    Anti-infectives: Anti-infectives    None      Assessment/Plan: Resolving SBO from incarcerated [facility] Pt very poor surgical candidate last EF 10-15% D/W the family in detail and recommend conservative rx No need for emergent surgical intervention  Caroleen Hamman, MD, FACS  03/11/2017

## 2017-03-11 NOTE — Progress Notes (Signed)
Patient transferred from ICU at 0600. No complications noted. No pain. Spouse is at the bedside. Will continue to monitor.

## 2017-03-11 NOTE — Progress Notes (Signed)
Foley catheter to remain at this time per Dr. Juleen China

## 2017-03-11 NOTE — Progress Notes (Signed)
Patient has rested quietly this shift with wife at bedside. Amio gtt at maintenance rate - cardiology following. Patient tolerating clear liquid diet. No pain. Up to chair for awhile today. Small BM today.

## 2017-03-11 NOTE — Care Management Important Message (Signed)
Important Message  Patient Details  Name: Ruben Stettner Sr. MRN: [id number] Date of Birth: 26-Nov-1925   Medicare Important Message Given:  Yes    Shelbie Ammons, RN 03/11/2017, 11:21 AM

## 2017-03-11 NOTE — Evaluation (Signed)
Physical Therapy Evaluation Patient Details Name: Ruben Lardizabal Sr. MRN: [id number] DOB: July 11, 1925 Today's Date: 03/11/2017   History of Present Illness  Pt is an 83 y.o. M with a PMH including a-fib, CVA, CKD stage 2, CHF, HLD and umbilical herniation. Presented to ED on 10/14 with abdominal pain and swelling and emesis secondary to small bowel obstruction, a-fib with RVR, acute on chronic systolic CHF, acute on chronic CKD stage 3, elevated troponin and HLD. Imaging confirms herniation of small loop of bowel, small bilateral pleural effusions, mild decrease in liver attenuation which may represent steatosis or vascular congestion from heart failure, pulmonary vascular congestion. Umbilical hernia reduced on 03/09/17; per previous notes, remains reduced.   Clinical Impression  Prior to hospital admission, pt was independent with ADLs, used a cane for walking prn, on continuous O2 at home on 2L; wife reports pt has getting progressively fatigued with activity over the past few weeks, but is still able to ambulate within the home.  Pt lives at home with his wife.  Currently pt is min guard for transfers and ambulation; ambulated 45ft with RW for support; pt reports fatigue at end of session. Activity tolerance limited secondary to fatigue and cardiopulmonary status. Vitals monitored; HR ranged 79-102 bpm with activity throughout session.  Pt would benefit from skilled PT to address noted impairments and functional limitations (see below for any additional details).  Upon hospital discharge, recommend pt discharge to home health PT with supervision for mobility/OOB.     Follow Up Recommendations Home health PT;Supervision for mobility/OOB    Equipment Recommendations  Rolling walker with 5" wheels (Pt has access to PT recommended DME)    Recommendations for Other Services       Precautions / Restrictions Precautions Precautions: Fall Restrictions Weight Bearing Restrictions: No       Mobility  Bed Mobility               General bed mobility comments: deferred, pt seated EOB beginning of session  Transfers Overall transfer level: Needs assistance Equipment used: Rolling walker (2 wheeled) Transfers: Sit to/from Stand Sit to Stand: Min guard         General transfer comment: Min guard for safety; min vc's for scooting to EOB before standing; no LOB or dizziness reported on standing  Ambulation/Gait Ambulation/Gait assistance: Min guard Ambulation Distance (Feet): 50 Feet Assistive device: Rolling walker (2 wheeled)       General Gait Details: Decreased stride length bilaterally, shuffling gait; vc's for keeping the RW close for balance  Stairs            Wheelchair Mobility    Modified Rankin (Stroke Patients Only)       Balance Overall balance assessment: Needs assistance Sitting-balance support: Feet supported Sitting balance-Leahy Scale: Good Sitting balance - Comments: able to perform LE mobility and MMT testing, reaching outside of BOS in sitting EOB without LOB   Standing balance support: Bilateral upper extremity supported Standing balance-Leahy Scale: Fair Standing balance comment: RW for BUE support in standing                             Pertinent Vitals/Pain Pain Assessment: No/denies pain    Home Living Family/patient expects to be discharged to:: Private residence Living Arrangements: Spouse/significant other Available Help at Discharge: Family Type of Home: House Home Access: Stairs to enter Entrance Stairs-Rails: Psychiatric nurse of Steps: 3 Home Layout: One level Home Equipment: Kasandra Knudsen -  single point;Shower seat - built in Additional Comments: RW and BSC available for use prn    Prior Function Level of Independence: Independent         Comments: continuous 2L O2 use at home; independent with all ADLs, uses cane for ambulation prn, does not drive; pt's wife reports pt has been  more and more fatigued with activity over the past few weeks;      Hand Dominance        Extremity/Trunk Assessment   Upper Extremity Assessment Upper Extremity Assessment: Overall WFL for tasks assessed    Lower Extremity Assessment Lower Extremity Assessment: Overall WFL for tasks assessed    Cervical / Trunk Assessment Cervical / Trunk Assessment: Normal  Communication   Communication: No difficulties  Cognition Arousal/Alertness: Awake/alert Behavior During Therapy: WFL for tasks assessed/performed Overall Cognitive Status: Within Functional Limits for tasks assessed                                 General Comments: Pt slightly fidgity and seemed unable to get comfortable seated EOB; oriented to person, birth date; able to answer questions about celebrating his birthday, what cake he had      General Comments General comments (skin integrity, edema, etc.): pt commented on his lower legs feeling like they were going to "bust open", pt's wife reports secondary to edema within BLE's    Exercises     Assessment/Plan    PT Assessment Patient needs continued PT services  PT Problem List Decreased activity tolerance;Decreased balance;Decreased mobility;Decreased knowledge of use of DME;Cardiopulmonary status limiting activity       PT Treatment Interventions DME instruction;Gait training;Stair training;Functional mobility training;Therapeutic activities;Therapeutic exercise;Balance training;Patient/family education    PT Goals (Current goals can be found in the Care Plan section)  Acute Rehab PT Goals Patient Stated Goal: to be able to go home PT Goal Formulation: With patient/family Time For Goal Achievement: 03/25/17 Potential to Achieve Goals: Good    Frequency Min 2X/week   Barriers to discharge        Co-evaluation               AM-PAC PT "6 Clicks" Daily Activity  Outcome Measure Difficulty turning over in bed (including adjusting  bedclothes, sheets and blankets)?: A Little Difficulty moving from lying on back to sitting on the side of the bed? : A Little Difficulty sitting down on and standing up from a chair with arms (e.g., wheelchair, bedside commode, etc,.)?: A Little Help needed moving to and from a bed to chair (including a wheelchair)?: A Little Help needed walking in hospital room?: A Little Help needed climbing 3-5 steps with a railing? : A Lot 6 Click Score: 17    End of Session Equipment Utilized During Treatment: Gait belt;Oxygen Activity Tolerance: Patient tolerated treatment well Patient left: in chair;with call bell/phone within reach;with chair alarm set;with family/visitor present Nurse Communication: Mobility status PT Visit Diagnosis: Unsteadiness on feet (R26.81);Muscle weakness (generalized) (M62.81)    Time: 4268-3419 PT Time Calculation (min) (ACUTE ONLY): 45 min   Charges:         PT G CodesWetzel Hudson, SPT 03/11/2017, 12:00 PM

## 2017-03-11 NOTE — Progress Notes (Addendum)
Mr. Rybacki was hospitalized with A-fib RVR, SBO, Acute on Chronic HF, BPH, elevated troponin - ruled out for ACS, Acute on Chronic CKD III.    Reviewed with patient and spouse the 5 ways to manage heart failure in the home.    Mrs. Huesman assists Mr. Mccabe with obtaining weighs every morning, assessing weight and symptoms according to HF Zones and taking action if needed, cooking/following low sodium diet, taking heart failure medications as prescribed, and being as active as patient can possibly be.    Patient is on Intake and Output and Daily Weights currently.  Discussed the role of the Heart Failure Clinic with Mr. & Mrs. Nott.  Mr. Hardge and his wife wish to discuss this  with Dr. Ubaldo Glassing first before making an appointment in the [facility].  Patient has managed care with [facility] Medicare, and two other nurses with their insurance that check on them.  Also, there is a NP, Managed [facility], with their insurance who is coming to their home to look at all their meds, etc.  Again, they are reluctant to make an appointment with yet another specialist as they live 25 minutes away and any appointment turns into a 4 hour trip.  Alert the [facility] in the event someone schedules the patient an appointment.    Both patient and wife thanked me for coming in to speak with them.    Roanna Epley, RN, BSN, [facility] Cardiovascular and Pulmonary Nurse Navigator

## 2017-03-11 NOTE — Progress Notes (Signed)
Patient refusing bed alarm. Educated on safety. Wife at bedside. Agreed not to get out of bed without nursing assistance.

## 2017-03-11 NOTE — Consult Note (Signed)
[facility]  CONSULT NOTE    Date: 03/11/2017                  Patient Name:  Ruben Villarruel Sr.  MRN: [id number]  DOB: November 22, 1925  Age / Sex: 83 y.o., male         PCP: Isaias Cowman, MD                 Service Requesting Consult: Dr. Benjie Karvonen                 Reason for Consult: Acute renal failure            History of Present Illness: Mr. Osby Sweetin Sr. is a 83 y.o. white male with systolic congestive heart failure EF of 10-15%, hypertension, atrial fibrillation, BPH, cerebrovascular disease, who was admitted to [facility] on 03/09/2017 for Small bowel obstruction ([place]) [I78.676] Hypoxia [R09.02] Encounter for nasogastric tube placement [Z46.59] Hypervolemia, unspecified hypervolemia type [E87.70]  SBO is being conservatively managed.   Wife at bedside with patient.   Patient states that he has had renal insufficiency for a long time. He states he has not been eating well and has had to increase his fursosemide and metolazone several times in the last few weeks.    Medications: Outpatient medications: Prescriptions Prior to Admission  Medication Sig Dispense Refill Last Dose  . ALPRAZolam (XANAX) 0.25 MG tablet Take 0.25 mg by mouth at bedtime as needed for sleep.  2 03/09/2017 at 0500  . apixaban (ELIQUIS) 2.5 MG TABS tablet Take 1 tablet (2.5 mg total) by mouth 2 (two) times daily. 60 tablet 2 03/08/2017 at 1800  . aspirin EC 81 MG EC tablet Take 1 tablet (81 mg total) by mouth daily. 30 tablet 2 03/08/2017 at 0800  . atorvastatin (LIPITOR) 10 MG tablet Take 10 mg by mouth daily.    03/08/2017 at 0800  . carvedilol (COREG) 3.125 MG tablet Take 3.125 mg by mouth 2 (two) times daily with a meal.  11 03/08/2017 at 2000  . ENTRESTO 24-26 MG Take 1 tablet by mouth every 12 (twelve) hours.  3 03/08/2017 at 2000  . furosemide (LASIX) 20 MG tablet Take 1 tablet (20 mg total) by mouth 2 (two) times daily. (Patient taking differently: Take 40 mg by mouth  daily. ) 60 tablet 2 03/08/2017 at 0800  . KLOR-CON M10 10 MEQ tablet Take 10 mEq by mouth daily.  11 03/08/2017 at 0800  . tamsulosin (FLOMAX) 0.4 MG CAPS capsule Take 0.4 mg by mouth. In am.   03/08/2017 at 0800  . betamethasone dipropionate (DIPROLENE) 0.05 % cream Apply topically 2 (two) times daily. (Patient not taking: Reported on 02/15/2016) 30 g 0 Completed Course at Unknown time  . betamethasone dipropionate (DIPROLENE) 0.05 % cream Apply topically 2 (two) times daily. (Patient not taking: Reported on 08/23/2016) 30 g 0 Completed Course at Unknown time  . docusate sodium (COLACE) 100 MG capsule Take 1 capsule (100 mg total) by mouth 2 (two) times daily. (Patient not taking: Reported on 02/15/2016) 60 capsule 0 Not Taking at Unknown time    Current medications: Current Facility-Administered Medications  Medication Dose Route Frequency Provider Last Rate Last Dose  . amiodarone (NEXTERONE PREMIX) 360-4.14 MG/200ML-% (1.8 mg/mL) IV infusion  30 mg/hr Intravenous Continuous Delman Kitten, MD 16.7 mL/hr at 03/11/17 0445 30 mg/hr at 03/11/17 0445  . carvedilol (COREG) tablet 3.125 mg  3.125 mg Oral BID WC Quintella Baton, MD   Stopped  at 03/10/17 1709  . heparin injection 5,000 Units  5,000 Units Subcutaneous Q8H Quintella Baton, MD   5,000 Units at 03/11/17 1426  . ondansetron (ZOFRAN) injection 4 mg  4 mg Intravenous Q6H PRN Crosley, Debby, MD      . tamsulosin (FLOMAX) capsule 0.4 mg  0.4 mg Oral Daily Crosley, Debby, MD   0.4 mg at 03/11/17 1033      Allergies: No Known Allergies    Past Medical History: Past Medical History:  Diagnosis Date  . Acute pulmonary edema ([place]) 02/26/2013  . Benign prostatic hyperplasia with urinary obstruction 02/24/2012  . BP (high blood pressure) 10/31/2015  . Cataract extraction status of left eye   . Cataract extraction status of right eye   . Cerebrovascular accident (CVA) ([place]) 04/24/2012  . Chronic kidney disease 02/16/2014   stage 2  . Congestive  heart failure ([place]) 10/31/2015  . HLD (hyperlipidemia) 10/31/2015  . Hydrocele 02/25/2012  . Unstable angina pectoris ([place]) 02/26/2013  . Urge incontinence of urine 05/30/2014  . Urinary urgency 10/31/2015     Past Surgical History: Past Surgical History:  Procedure Laterality Date  . ANKLE FRACTURE SURGERY Right   . CIRCUMCISION N/A 11/13/2015   Procedure: CIRCUMCISION ADULT;  Surgeon: Hollice Espy, MD;  Location: [facility] ORS;  Service: Urology;  Laterality: N/A;  . EYE SURGERY    . HERNIA REPAIR    . HYDROCELE EXCISION Bilateral 11/13/2015   Procedure: HYDROCELECTOMY ADULT;  Surgeon: Hollice Espy, MD;  Location: [facility] ORS;  Service: Urology;  Laterality: Bilateral;  . TONSILLECTOMY  1950's  . TONSILLECTOMY    . UMBILICAL HERNIA REPAIR    . VARICOCELECTOMY  1948  . VASECTOMY       Family History: Family History  Problem Relation Age of Onset  . Kidney cancer Neg Hx   . Prostate cancer Neg Hx      Social History: Social History   Social History  . Marital status: Married    Spouse name: N/A  . Number of children: N/A  . Years of education: N/A   Occupational History  . Not on file.   Social History Main Topics  . Smoking status: Former Smoker    Quit date: 11/03/1954  . Smokeless tobacco: Never Used  . Alcohol use 4.8 oz/week    7 Shots of liquor, 1 Standard drinks or equivalent per week     Comment: 1 shot of gin a day.  . Drug use: No  . Sexual activity: Not on file   Other Topics Concern  . Not on file   Social History Narrative  . No narrative on file     Review of Systems: Review of Systems  Constitutional: Negative.   HENT: Negative.   Eyes: Negative.   Respiratory: Negative.   Cardiovascular: Negative.   Gastrointestinal: Positive for abdominal pain, heartburn and nausea. Negative for blood in stool, constipation, diarrhea, melena and vomiting.  Genitourinary: Negative.  Negative for dysuria, flank pain, frequency, hematuria and urgency.  Skin:  Negative.   Neurological: Negative.   Endo/Heme/Allergies: Negative.   Psychiatric/Behavioral: Negative.     Vital Signs: Blood pressure (!) 84/47, pulse 72, temperature 98.3 F (36.8 C), temperature source Oral, resp. rate 16, height 5\' 9"  (1.753 m), weight 70.6 kg (155 lb 10.3 oz), SpO2 100 %.  Weight trends: Filed Weights   03/09/17 1632 03/10/17 0500 03/11/17 0500  Weight: 68.9 kg (152 lb) 68.2 kg (150 lb 5.7 oz) 70.6 kg (155 lb 10.3 oz)  Physical Exam: General: NAD, cachectic  Head: Normocephalic, atraumatic. Moist oral mucosal membranes  Eyes: Anicteric, PERRL  Neck: Supple, trachea midline  Lungs:  Clear to auscultation  Heart: Irregular, +murmur  Abdomen:  Soft, nontender, +hernia  Extremities: no peripheral edema.  Neurologic: Nonfocal, moving all four extremities  Skin: No lesions        Lab results: Basic Metabolic Panel:  Recent Labs Lab 03/09/17 1634 03/10/17 0521 03/11/17 0340  NA 141 141 138  K 4.6 4.5 4.4  CL 100* 103 102  CO2 26 26 26   GLUCOSE 141* 149* 106*  BUN 56* 71* 84*  CREATININE 2.59* 3.00* 3.98*  CALCIUM 9.9 9.0 8.7*  MG 2.6*  --   --   PHOS 6.0*  --   --     Liver Function Tests:  Recent Labs Lab 03/09/17 1634  AST 38  ALT 23  ALKPHOS 105  BILITOT 2.7*  PROT 8.3*  ALBUMIN 4.1    Recent Labs Lab 03/09/17 1634  LIPASE 37   No results for input(s): AMMONIA in the last 168 hours.  CBC:  Recent Labs Lab 03/09/17 1634 03/10/17 0521 03/11/17 0340  WBC 6.4 5.3 5.8  HGB 13.8 12.0* 12.8*  HCT 41.9 35.9* 38.2*  MCV 96.5 95.8 95.9  PLT 140* 109* 104*    Cardiac Enzymes:  Recent Labs Lab 03/09/17 1634  TROPONINI 0.05*    BNP: Invalid input(s): POCBNP  CBG:  Recent Labs Lab 03/09/17 2154  GLUCAP 146*    Microbiology: Results for orders placed or performed during the hospital encounter of 03/09/17  MRSA PCR Screening     Status: None   Collection Time: 03/09/17 10:28 PM  Result Value Ref Range  Status   MRSA by PCR NEGATIVE NEGATIVE Final    Comment:        The GeneXpert MRSA Assay (FDA approved for NASAL specimens only), is one component of a comprehensive MRSA colonization surveillance program. It is not intended to diagnose MRSA infection nor to guide or monitor treatment for MRSA infections.     Coagulation Studies:  Recent Labs  03/09/17 1654 03/10/17 0805  LABPROT 16.0* 16.6*  INR 1.29 1.35    Urinalysis:  Recent Labs  03/09/17 1654  COLORURINE AMBER*  LABSPEC 1.014  PHURINE 5.0  GLUCOSEU NEGATIVE  HGBUR SMALL*  BILIRUBINUR NEGATIVE  KETONESUR NEGATIVE  PROTEINUR 30*  NITRITE NEGATIVE  LEUKOCYTESUR NEGATIVE      Imaging: Ct Abdomen Pelvis Wo Contrast  Result Date: 03/09/2017 CLINICAL DATA:  83 y/o M; lower abdominal pain with 1 episode of vomiting and coffee-ground emesis. EXAM: CT ABDOMEN AND PELVIS WITHOUT CONTRAST TECHNIQUE: Multidetector CT imaging of the abdomen and pelvis was performed following the standard protocol without IV contrast. COMPARISON:  03/29/2010 CT of the abdomen and pelvis. FINDINGS: Lower chest: Mild coronary artery calcification. Mitral annular calcification. Mild cardiomegaly. Small bilateral pleural effusions. Hepatobiliary: No focal liver abnormality is seen. Mild decreased attenuation of liver. No gallstones, gallbladder wall thickening, or biliary dilatation. Pancreas: Unremarkable. No pancreatic ductal dilatation or surrounding inflammatory changes. Spleen: Normal in size without focal abnormality. Adrenals/Urinary Tract: Normal adrenal glands. Punctate densities in the left kidney may represent nonobstructing nephrolithiasis. No hydronephrosis. Normal bladder. Stomach/Bowel: Small bowel obstruction with transition of bowel caliber and a small paraumbilical hernia where there is herniation of small bowel. The hernia neck measures 14 mm (series 6, image 61 and series 2 image 50 [id number]). Normal colon. Normal stomach.  Vascular/Lymphatic: Aortic atherosclerosis. No enlarged abdominal  or pelvic lymph nodes. Reproductive: Prostate enlargement. Other: Small volume of ascites. Musculoskeletal: No fracture is seen. Multilevel degenerative changes of the spine greatest at L5-S1 where there is moderate disc space narrowing. IMPRESSION: 1. Small bowel obstruction with transition in bowel caliber at a small paraumbilical hernia where there is herniation of a short loop of small bowel. 2. Small volume of ascites. 3. Small bilateral pleural effusions. 4. Cardiomegaly.  Coronary and aortic calcific atherosclerosis. 5. Mild decrease in liver attenuation may represent steatosis or vascular congestion from heart failure. Electronically Signed   By: Kristine Garbe M.D.   On: 03/09/2017 17:40   Dg Abd 1 View  Result Date: 03/09/2017 CLINICAL DATA:  Nasogastric tube placement. EXAM: ABDOMEN - 1 VIEW COMPARISON:  Abdominal CT 03/09/2017 FINDINGS: Enteric catheter projects over the mid upper abdomen, tip in uncertain location. Abdominal ascites. Bilateral pleural effusions. IMPRESSION: Uncertain position of the enteric catheter. Electronically Signed   By: Fidela Salisbury M.D.   On: 03/09/2017 19:05   Dg Chest Port 1 View  Result Date: 03/10/2017 CLINICAL DATA:  Respiratory failure EXAM: PORTABLE CHEST 1 VIEW COMPARISON:  03/09/2017 FINDINGS: Layering bilateral effusions with bibasilar atelectasis, stable. Cardiomegaly with vascular congestion. Mild cardiomegaly. No change since prior study. IMPRESSION: Continued layering bilateral effusions and bibasilar atelectasis. Stable cardiomegaly, vascular congestion. Electronically Signed   By: Rolm Baptise M.D.   On: 03/10/2017 10:17   Dg Chest Portable 1 View  Result Date: 03/09/2017 CLINICAL DATA:  83 y/o  M; abdominal pain and mild hypoxia. EXAM: PORTABLE CHEST 1 VIEW COMPARISON:  08/23/2016 chest radiograph FINDINGS: Stable cardiomegaly given projection and technique. Aortic  atherosclerosis with calcification. Small bilateral pleural effusions and bibasilar opacities. Pulmonary vascular congestion. No acute osseous abnormality is evident. IMPRESSION: Stable cardiomegaly. Small bilateral pleural effusions and bibasilar opacities probably representing associated atelectasis. Pulmonary vascular congestion. Electronically Signed   By: Kristine Garbe M.D.   On: 03/09/2017 17:47   Dg Abd Portable 1 View  Result Date: 03/09/2017 CLINICAL DATA:  Nasogastric tube placement EXAM: PORTABLE ABDOMEN - 1 VIEW COMPARISON:  None. FINDINGS: The nasogastric tube extends into the stomach with tip in the region of the mid gastric body. IMPRESSION: Nasogastric tube extends into the stomach. Electronically Signed   By: Andreas Newport M.D.   On: 03/09/2017 21:19   Dg Abd Portable 2v  Result Date: 03/10/2017 CLINICAL DATA:  Followup small bowel obstruction. EXAM: PORTABLE ABDOMEN - 2 VIEW COMPARISON:  04/09/2017 FINDINGS: Nasogastric tube tip is again seen overlying the body the stomach. Several gas-filled small bowel loops are seen within the left abdomen, with mild dilatation measuring up to 3 cm. Paucity of colonic gas is seen. This is consistent with a low-grade distal small bowel obstruction. No evidence of free intraperitoneal air. Bilateral pleural effusions again noted. IMPRESSION: Mildly dilated small bowel loops, suspicious for distal small bowel obstruction and without significant change. Stable bilateral pleural effusions. Electronically Signed   By: Earle Gell M.D.   On: 03/10/2017 09:18      Assessment & Plan: Mr. Erol Flanagin Sr. is a 83 y.o. white male with systolic congestive heart failure EF of 10-15%, hypertension, atrial fibrillation, BPH, cerebrovascular disease, who was admitted to [facility] on 03/09/2017 for Small bowel obstruction ([place]) [X91.478] Hypoxia [R09.02] Encounter for nasogastric tube placement [Z46.59] Hypervolemia, unspecified hypervolemia type  [E87.70]  1. Acute renal failure on chronic kidney disease stage IV with proteinuria: nonoliguric.  Baseline creatinine of 2.2, GFR of 28 on 10/12 Acute renal  failure from ATN, prerenal azotemia, overdiuresis and/or acute cardiorenal syndrome.  Would not recommend IV fluids due to cardiac function.  Will monitor and encourage patient with his clear liquid diet.  Holding furosemide, metolazone and potassium.   2. Hypertension: with systolic congestive heart failure EF 10-15%. Hypotensive. Holding all diuretics and continue to monitor.  - carvedilol and tamsulosin - amiodarone IV gtt - Appreciate cards input  3. Obstructive uropathy: foley catheter placed.   LOS: 2 Kasai Beltran 10/16/20183:59 PM

## 2017-03-12 ENCOUNTER — Inpatient Hospital Stay: Payer: Medicare Other

## 2017-03-12 LAB — BASIC METABOLIC PANEL WITH GFR
Anion gap: 12 (ref 5–15)
BUN: 98 mg/dL — ABNORMAL HIGH (ref 6–20)
CO2: 26 mmol/L (ref 22–32)
Calcium: 8.8 mg/dL — ABNORMAL LOW (ref 8.9–10.3)
Chloride: 99 mmol/L — ABNORMAL LOW (ref 101–111)
Creatinine, Ser: 4.7 mg/dL — ABNORMAL HIGH (ref 0.61–1.24)
GFR calc Af Amer: 12 mL/min — ABNORMAL LOW
GFR calc non Af Amer: 10 mL/min — ABNORMAL LOW
Glucose, Bld: 106 mg/dL — ABNORMAL HIGH (ref 65–99)
Potassium: 4.1 mmol/L (ref 3.5–5.1)
Sodium: 137 mmol/L (ref 135–145)

## 2017-03-12 LAB — CBC
HCT: 38.9 % — ABNORMAL LOW (ref 40.0–52.0)
Hemoglobin: 13.1 g/dL (ref 13.0–18.0)
MCH: 32.1 pg (ref 26.0–34.0)
MCHC: 33.7 g/dL (ref 32.0–36.0)
MCV: 95.4 fL (ref 80.0–100.0)
Platelets: 118 K/uL — ABNORMAL LOW (ref 150–440)
RBC: 4.08 MIL/uL — ABNORMAL LOW (ref 4.40–5.90)
RDW: 15.8 % — ABNORMAL HIGH (ref 11.5–14.5)
WBC: 6.1 K/uL (ref 3.8–10.6)

## 2017-03-12 MED ORDER — AMIODARONE HCL 200 MG PO TABS
400.0000 mg | ORAL_TABLET | Freq: Every day | ORAL | Status: DC
  Administered 2017-03-12 – 2017-03-20 (×9): 400 mg via ORAL
  Filled 2017-03-12 (×9): qty 2

## 2017-03-12 NOTE — Progress Notes (Signed)
[facility]  ROUNDING NOTE   Subjective:   Wife at bedside.   Creatinine 4.7 (3.98)  UOP 300 - foley catheter.   Carvedilol discontinued due to hypotension  SBO has improved. Tolerating liquid diet.   Objective:  Vital signs in last 24 hours:  Temp:  [97.4 F (36.3 C)] 97.4 F (36.3 C) (10/17 0421) Pulse Rate:  [57-97] 57 (10/17 1118) Resp:  [17] 17 (10/17 0421) BP: (84-102)/(47-72) 90/58 (10/17 1118) SpO2:  [97 %-100 %] 100 % (10/17 1118) Weight:  [69.9 kg (154 lb 3.2 oz)] 69.9 kg (154 lb 3.2 oz) (10/17 0421)  Weight change: -0.655 kg (-1 lb 7.1 oz) Filed Weights   03/10/17 0500 03/11/17 0500 03/12/17 0421  Weight: 68.2 kg (150 lb 5.7 oz) 70.6 kg (155 lb 10.3 oz) 69.9 kg (154 lb 3.2 oz)    Intake/Output: I/O last 3 completed shifts: In: 1440.6 [P.O.:1140; I.V.:300.6] Out: 300 [Urine:300]   Intake/Output this shift:  No intake/output data recorded.  Physical Exam: General: NAD, laying in bed, cachectic  Head: Normocephalic, atraumatic. Dry oral mucosal membranes  Eyes: Anicteric, PERRL  Neck: Supple, trachea midline  Lungs:  Clear to auscultation  Heart: irregular  Abdomen:  Soft, nontender  Extremities: No peripheral edema.  Neurologic: Nonfocal, moving all four extremities  Skin: No lesions  GU: Foley with yellow urine    Basic Metabolic Panel:  Recent Labs Lab 03/09/17 1634 03/10/17 0521 03/11/17 0340 03/12/17 0503  NA 141 141 138 137  K 4.6 4.5 4.4 4.1  CL 100* 103 102 99*  CO2 26 26 26 26   GLUCOSE 141* 149* 106* 106*  BUN 56* 71* 84* 98*  CREATININE 2.59* 3.00* 3.98* 4.70*  CALCIUM 9.9 9.0 8.7* 8.8*  MG 2.6*  --   --   --   PHOS 6.0*  --   --   --     Liver Function Tests:  Recent Labs Lab 03/09/17 1634  AST 38  ALT 23  ALKPHOS 105  BILITOT 2.7*  PROT 8.3*  ALBUMIN 4.1    Recent Labs Lab 03/09/17 1634  LIPASE 37   No results for input(s): AMMONIA in the last 168 hours.  CBC:  Recent Labs Lab  03/09/17 1634 03/10/17 0521 03/11/17 0340 03/12/17 0503  WBC 6.4 5.3 5.8 6.1  HGB 13.8 12.0* 12.8* 13.1  HCT 41.9 35.9* 38.2* 38.9*  MCV 96.5 95.8 95.9 95.4  PLT 140* 109* 104* 118*    Cardiac Enzymes:  Recent Labs Lab 03/09/17 1634  TROPONINI 0.05*    BNP: Invalid input(s): POCBNP  CBG:  Recent Labs Lab 03/09/17 2154  GLUCAP 146*    Microbiology: Results for orders placed or performed during the hospital encounter of 03/09/17  MRSA PCR Screening     Status: None   Collection Time: 03/09/17 10:28 PM  Result Value Ref Range Status   MRSA by PCR NEGATIVE NEGATIVE Final    Comment:        The GeneXpert MRSA Assay (FDA approved for NASAL specimens only), is one component of a comprehensive MRSA colonization surveillance program. It is not intended to diagnose MRSA infection nor to guide or monitor treatment for MRSA infections.     Coagulation Studies:  Recent Labs  03/09/17 1654 03/10/17 0805  LABPROT 16.0* 16.6*  INR 1.29 1.35    Urinalysis:  Recent Labs  03/09/17 1654  COLORURINE AMBER*  LABSPEC 1.014  PHURINE 5.0  GLUCOSEU NEGATIVE  HGBUR SMALL*  BILIRUBINUR NEGATIVE  KETONESUR NEGATIVE  PROTEINUR 30*  NITRITE NEGATIVE  LEUKOCYTESUR NEGATIVE      Imaging: No results found.   Medications:    . amiodarone  400 mg Oral Daily  . heparin  5,000 Units Subcutaneous Q8H  . tamsulosin  0.4 mg Oral Daily   [DISCONTINUED] ondansetron **OR** ondansetron (ZOFRAN) IV  Assessment/ Plan:   [name] is a 83 y.o. white male with systolic congestive heart failure EF of 10-15%, hypertension, atrial fibrillation, BPH, cerebrovascular disease, who was admitted to [facility] on 03/09/2017   1. Acute renal failure on chronic kidney disease stage IV with proteinuria: nonoliguric.  Baseline creatinine of 2.2, GFR of 28 on 10/12 Acute renal failure from ATN, prerenal azotemia, overdiuresis and/or acute cardiorenal syndrome.  Would not  recommend IV fluids due to cardiac function.  Will monitor and encourage patient with PO intake No acute indication for dialysis. Patient is unsure he would want dialysis Holding furosemide, metolazone and potassium.   2. Hypotension: with systolic congestive heart failure EF 10-15%. Hypotensive. Holding all diuretics and continue to monitor.  Discontinued carvedilol - Discontinue tamsulosin today.  - Appreciate cards input  3. Obstructive uropathy: foley catheter placed. Previously was following with Dr. Pilar Jarvis, [place].  - Due to hypotension, will stop tamsulosin - Check renal ultrasound - Will need outpatient follow up with urology.    Overall prognosis is quite poor. Discussed case with Dr. Anselm Jungling. Will consult Palliative Care.    LOS: 3 Ruben Hudson 10/17/201811:20 AM

## 2017-03-12 NOTE — Progress Notes (Signed)
       [place] [facility]  SUBJECTIVE: better but weak   Vitals:   03/11/17 1946 03/12/17 0421 03/12/17 0848 03/12/17 1118  BP: (!) 87/53 102/68 91/72 (!) 90/58  Pulse: 61 91 88 (!) 57  Resp: 17 17    Temp:  (!) 97.4 F (36.3 C)    TempSrc:  Oral    SpO2: 100% 98% 97% 100%  Weight:  69.9 kg (154 lb 3.2 oz)    Height:        Intake/Output Summary (Last 24 hours) at 03/12/17 1419 Last data filed at 03/12/17 1405  Gross per 24 hour  Intake          1233.91 ml  Output              150 ml  Net          1083.91 ml    LABS: Basic Metabolic Panel:  Recent Labs  03/09/17 1634  03/11/17 0340 03/12/17 0503  NA 141  < > 138 137  K 4.6  < > 4.4 4.1  CL 100*  < > 102 99*  CO2 26  < > 26 26  GLUCOSE 141*  < > 106* 106*  BUN 56*  < > 84* 98*  CREATININE 2.59*  < > 3.98* 4.70*  CALCIUM 9.9  < > 8.7* 8.8*  MG 2.6*  --   --   --   PHOS 6.0*  --   --   --   < > = values in this interval not displayed. Liver Function Tests:  Recent Labs  03/09/17 1634  AST 38  ALT 23  ALKPHOS 105  BILITOT 2.7*  PROT 8.3*  ALBUMIN 4.1    Recent Labs  03/09/17 1634  LIPASE 37   CBC:  Recent Labs  03/11/17 0340 03/12/17 0503  WBC 5.8 6.1  HGB 12.8* 13.1  HCT 38.2* 38.9*  MCV 95.9 95.4  PLT 104* 118*   Cardiac Enzymes:  Recent Labs  03/09/17 1634  TROPONINI 0.05*   BNP: Invalid input(s): POCBNP D-Dimer: No results for input(s): DDIMER in the last 72 hours. Hemoglobin A1C: No results for input(s): HGBA1C in the last 72 hours. Fasting Lipid Panel: No results for input(s): CHOL, HDL, LDLCALC, TRIG, CHOLHDL, LDLDIRECT in the last 72 hours. Thyroid Function Tests: No results for input(s): TSH, T4TOTAL, T3FREE, THYROIDAB in the last 72 hours.  Invalid input(s): FREET3 Anemia Panel: No results for input(s): VITAMINB12, FOLATE, FERRITIN, TIBC, IRON, RETICCTPCT in the last 72 hours.   Physical Exam: Blood pressure (!) 90/58, pulse  (!) 57, temperature (!) 97.4 F (36.3 C), temperature source Oral, resp. rate 17, height 5\' 9"  (1.753 m), weight 69.9 kg (154 lb 3.2 oz), SpO2 100 %.   Wt Readings from Last 1 Encounters:  03/12/17 69.9 kg (154 lb 3.2 oz)     General appearance: alert and cooperative Cardio: irregularly irregular rhythm GI: soft, non-tender; bowel sounds normal; no masses,  no organomegaly Neurologic: Grossly normal  TELEMETRY: Reviewed telemetry pt in afib:  ASSESSMENT AND PLAN:  Active Problems:   Chronic kidney disease, stage 3 (HCC)-wrosening renal funciton.    Hyperlipidemia   Acute on chronic systolic CHF (congestive heart failure) (HCC)-symptoms stable.    SBO (small bowel obstruction) (HCC)   Atrial fibrillation with RVR (HCC)-rate controlled    Teodoro Spray, MD, [facility] 03/12/2017 2:19 PM

## 2017-03-12 NOTE — Progress Notes (Signed)
[facility] [place] NAME: Ruben Hudson    MR#:  [id number]  DATE OF BIRTH:  [birth year]  SUBJECTIVE:    patient tolerating clear liquids today Wife at bedside, renal func is worsening.  REVIEW OF SYSTEMS:    Review of Systems  Constitutional: Negative for fever, chills weight loss HENT: Negative for ear pain, nosebleeds, congestion, facial swelling, rhinorrhea, neck pain, neck stiffness and ear discharge.   Respiratory: Negative for cough, shortness of breath, wheezing  Cardiovascular: Negative for chest pain, palpitations and leg swelling.  Gastrointestinal: Negative for heartburn, abdominal pain, vomiting, diarrhea or consitpation Genitourinary: Negative for dysuria, urgency, frequency, hematuria Musculoskeletal: Negative for back pain or joint pain Neurological: Negative for dizziness, seizures, syncope, focal weakness,  numbness and headaches.  Hematological: Does not bruise/bleed easily.  Psychiatric/Behavioral: Negative for hallucinations, confusion, dysphoric mood    Tolerating Diet: clear liquid diet     DRUG ALLERGIES:  No Known Allergies  VITALS:  Blood pressure (!) 101/54, pulse 65, temperature (!) 97.5 F (36.4 C), temperature source Oral, resp. rate 18, height 5\' 9"  (1.753 m), weight 69.9 kg (154 lb 3.2 oz), SpO2 90 %.  PHYSICAL EXAMINATION:  Constitutional: Appears well-developed and well-nourished. No distress. HENT: Normocephalic. Marland Kitchen Oropharynx is clear and moist.  Eyes: Conjunctivae and EOM are normal. PERRLA, no scleral icterus.  Neck: Normal ROM. Neck supple. No JVD. No tracheal deviation. CVS: IRR, IRR, S1/S2 +,2/6 SEM no gallops, no carotid bruit.  Pulmonary: Effort and breath sounds normal, no stridor, rhonchi, wheezes, rales.  Abdominal: Soft., + vental hernia palpable good bowel sounds no distension, tenderness, rebound or guarding.  Musculoskeletal: Normal range of motion. No edema and no tenderness.   Neuro: Alert. CN 2-12 grossly intact. No focal deficits. Skin: Skin is warm and dry. No rash noted. Psychiatric: Normal mood and affect.      LABORATORY PANEL:   CBC  Recent Labs Lab 03/12/17 0503  WBC 6.1  HGB 13.1  HCT 38.9*  PLT 118*   ------------------------------------------------------------------------------------------------------------------  Chemistries   Recent Labs Lab 03/09/17 1634  03/12/17 0503  NA 141  < > 137  K 4.6  < > 4.1  CL 100*  < > 99*  CO2 26  < > 26  GLUCOSE 141*  < > 106*  BUN 56*  < > 98*  CREATININE 2.59*  < > 4.70*  CALCIUM 9.9  < > 8.8*  MG 2.6*  --   --   AST 38  --   --   ALT 23  --   --   ALKPHOS 105  --   --   BILITOT 2.7*  --   --   < > = values in this interval not displayed. ------------------------------------------------------------------------------------------------------------------  Cardiac Enzymes  Recent Labs Lab 03/09/17 1634  TROPONINI 0.05*   ------------------------------------------------------------------------------------------------------------------  RADIOLOGY:  US Renal  Result Date: 03/12/2017 CLINICAL DATA:  Chronic kidney disease stage IV, acute renal failure EXAM: RENAL / URINARY TRACT ULTRASOUND COMPLETE COMPARISON:  CT abdomen/pelvis dated 03/09/2017 FINDINGS: Right Kidney: Length: 9.6 cm.  Echogenic renal parenchyma.  No hydronephrosis. Left Kidney: Length: 10.0 cm.  Echogenic renal parenchyma.  No hydronephrosis. Bladder: Bladder is decompressed by indwelling Foley catheter. Additional comments: Bilateral pleural effusions. Abdominopelvic ascites. IMPRESSION: No hydronephrosis. Echogenic renal parenchyma, suggesting medical renal disease. Electronically Signed   By: Julian Hy M.D.   On: 03/12/2017 16:38     ASSESSMENT AND PLAN:   83 y/o make with  PAF, CKD stage 3 and HTN presents with abdominal pain and found to have small bowel obstruction with Ruben Hudson hernia.  1. Small bowel  obstruction with resolved incarceration from umbilical hernia Surgery consultation appreciated Abdomen is soft and obstruction has resolved Management as per surgery Does not appear the patient will need to undergo surgery Upgrade the diet.  2. Rapid atrial fibrillation: Heart rate has improved on amiodarone and Coreg Patient may possibly be transitioned to oral amiodarone Consider restarting anticoagulation tomorrow   3. Acute on chronic systolic heart failure with ejection fraction less than 10-15% by last echo in March Watch for fluid overload Monitor intake and output with daily weight Cardiology consultation Appreciated Patient appears to be euvolemic and therefore no need for continuing IV Lasix.  4. BPH: Continue Flomax d/c today due to hypotension.  5. Elevated troponin with baseline chronic elevation troponin Patient has ruled out for ACS.  6.acute on chronic kidney disease stage III: Creatinine has increased in the setting of above issues Nephrology consultation appreciated. BMP for a.m. Hold nephrotoxic medications Pt have hypotension, encourage oral intake, stopped tamsulosin also.  Physical therapy consultation for discharge planningrequested. Palliative care consult.  Management plans discussed with the patient and wife and they are in agreement.  CODE STATUS: FULl  TOTAL TIME TAKING CARE OF THIS PATIENT: 32 minutes.    POSSIBLE D/C 2-3 days, DEPENDING ON CLINICAL CONDITION.   Ruben Hudson M.D on 03/12/2017 at 9:12 PM  Between 7am to 6pm - Pager - [phone] After 6pm go to [email or web address] - password [email or web address] [facility]  Office  [phone]  CC: Primary care physician; Ruben Cowman, MD  Note: This dictation was prepared with Dragon dictation along with smaller phrase technology. Any transcriptional errors that result from this process are unintentional.

## 2017-03-12 NOTE — Care Management Note (Addendum)
Case Management Note  Patient Details  Name: Ruben Danzer Sr. MRN: [id number] Date of Birth: 18-Sep-1925  Subjective/Objective:   Admitted to [facility] with the diagnosis of possible small bowel obstruction. Lives with wife, Ruben Hudson [phone]). Last seen Dr. Ubaldo Glassing 02/04/17. Prescriptions are filled at [facility] [place]. No home health. No skilled Nursing. Home oxygen per [facility] since April 2018. Uses 2 liters per nasal cannula continuous. Cane, rolling walker, and raised toilet seat in the home. Self feed, wife helps with baths and dressing. [place] [facility] last June.Good appetite prior to this admission. Family will transport                  Action/Plan: Physical therapy evaluation completed. Recommending home with home health and physical therapy. No preference as long as they come to [place] area   Expected Discharge Date:  03/12/17               Expected Discharge Plan:     In-House Referral:     Discharge planning Services   yes  Post Acute Care Choice:   yes Choice offered to:   Peggy wife  DME Arranged:    DME Agency:     HH Arranged:   Yes [place] Agency:    [facility]  Status of Service:     If discussed at [employer], dates discussed:    Additional Comments:  Shelbie Ammons, RN MSN [facility] [phone] 03/12/2017, 12:10 PM

## 2017-03-12 NOTE — Progress Notes (Signed)
Stopped amio gtt.  BP low, HR WDL.  Continue to monitor.

## 2017-03-12 NOTE — Progress Notes (Signed)
CC: SBO Subjective: Taking po, had bm No abd pain Having issues w a fib and BP  Objective: Vital signs in last 24 hours: Temp:  [97.4 F (36.3 C)] 97.4 F (36.3 C) (10/17 0421) Pulse Rate:  [61-97] 88 (10/17 0848) Resp:  [17] 17 (10/17 0421) BP: (84-102)/(47-72) 91/72 (10/17 0848) SpO2:  [97 %-100 %] 97 % (10/17 0848) Weight:  [69.9 kg (154 lb 3.2 oz)] 69.9 kg (154 lb 3.2 oz) (10/17 0421) Last BM Date: 03/11/17  Intake/Output from previous day: 10/16 0701 - 10/17 0700 In: 1423.9 [P.O.:1140; I.V.:283.9] Out: 300 [Urine:300] Intake/Output this shift: No intake/output data recorded.  Physical exam: Debilitated male, malnourished in NAD Abd: soft, nt, reducible U[facility]  Lab Results: CBC   Recent Labs  03/11/17 0340 03/12/17 0503  WBC 5.8 6.1  HGB 12.8* 13.1  HCT 38.2* 38.9*  PLT 104* 118*   BMET  Recent Labs  03/11/17 0340 03/12/17 0503  NA 138 137  K 4.4 4.1  CL 102 99*  CO2 26 26  GLUCOSE 106* 106*  BUN 84* 98*  CREATININE 3.98* 4.70*  CALCIUM 8.7* 8.8*   PT/INR  Recent Labs  03/09/17 1654 03/10/17 0805  LABPROT 16.0* 16.6*  INR 1.29 1.35   ABG  Recent Labs  03/09/17 1855  PHART 7.37  HCO3 24.9    Studies/Results: No results found.  Anti-infectives: Anti-infectives    None      Assessment/Plan:  Resolved SBO No surgical intervention due to very poor surgical candidate D/w w wife in detail and she understands We will be available  Caroleen Hamman, MD, FACS  03/12/2017

## 2017-03-13 DIAGNOSIS — N184 Chronic kidney disease, stage 4 (severe): Secondary | ICD-10-CM

## 2017-03-13 DIAGNOSIS — I5023 Acute on chronic systolic (congestive) heart failure: Secondary | ICD-10-CM

## 2017-03-13 DIAGNOSIS — Z7189 Other specified counseling: Secondary | ICD-10-CM

## 2017-03-13 DIAGNOSIS — Z515 Encounter for palliative care: Secondary | ICD-10-CM

## 2017-03-13 DIAGNOSIS — I4891 Unspecified atrial fibrillation: Secondary | ICD-10-CM

## 2017-03-13 LAB — BASIC METABOLIC PANEL
Anion gap: 14 (ref 5–15)
BUN: 93 mg/dL — ABNORMAL HIGH (ref 6–20)
CHLORIDE: 98 mmol/L — AB (ref 101–111)
CO2: 23 mmol/L (ref 22–32)
CREATININE: 5.08 mg/dL — AB (ref 0.61–1.24)
Calcium: 8.7 mg/dL — ABNORMAL LOW (ref 8.9–10.3)
GFR calc non Af Amer: 9 mL/min — ABNORMAL LOW (ref >60)
GFR, EST AFRICAN AMERICAN: 10 mL/min — AB (ref >60)
Glucose, Bld: 101 mg/dL — ABNORMAL HIGH (ref 65–99)
POTASSIUM: 3.9 mmol/L (ref 3.5–5.1)
Sodium: 135 mmol/L (ref 135–145)

## 2017-03-13 LAB — CBC
HEMATOCRIT: 38 % — AB (ref 40.0–52.0)
HEMOGLOBIN: 12.6 g/dL — AB (ref 13.0–18.0)
MCH: 31.4 pg (ref 26.0–34.0)
MCHC: 33.2 g/dL (ref 32.0–36.0)
MCV: 94.6 fL (ref 80.0–100.0)
Platelets: 129 10*3/uL — ABNORMAL LOW (ref 150–440)
RBC: 4.02 MIL/uL — ABNORMAL LOW (ref 4.40–5.90)
RDW: 16 % — ABNORMAL HIGH (ref 11.5–14.5)
WBC: 5.1 10*3/uL (ref 3.8–10.6)

## 2017-03-13 MED ORDER — SODIUM CHLORIDE 0.9% FLUSH
3.0000 mL | Freq: Two times a day (BID) | INTRAVENOUS | Status: DC
  Administered 2017-03-13 – 2017-03-20 (×12): 3 mL via INTRAVENOUS

## 2017-03-13 MED ORDER — APIXABAN 2.5 MG PO TABS
2.5000 mg | ORAL_TABLET | Freq: Two times a day (BID) | ORAL | Status: DC
  Administered 2017-03-13 – 2017-03-14 (×2): 2.5 mg via ORAL
  Filled 2017-03-13 (×2): qty 1

## 2017-03-13 MED ORDER — MIDODRINE HCL 5 MG PO TABS
2.5000 mg | ORAL_TABLET | Freq: Three times a day (TID) | ORAL | Status: DC
  Administered 2017-03-13 – 2017-03-20 (×20): 2.5 mg via ORAL
  Filled 2017-03-13 (×17): qty 1

## 2017-03-13 NOTE — Progress Notes (Signed)
[facility]  ROUNDING NOTE   Subjective:   Wife at bedside. Meeting with palliative care today.   Creatinine 5.08 (4.7) (3.98)  UOP 625 - foley catheter.   BM last night. Tolerating liquid diet.   Objective:  Vital signs in last 24 hours:  Temp:  [97.4 F (36.3 C)-97.5 F (36.4 C)] 97.4 F (36.3 C) (10/18 0445) Pulse Rate:  [44-73] 44 (10/18 0802) Resp:  [17-18] 18 (10/18 0802) BP: (80-104)/(37-58) 104/49 (10/18 0802) SpO2:  [90 %-100 %] 91 % (10/18 0802) Weight:  [69.8 kg (153 lb 14.4 oz)] 69.8 kg (153 lb 14.4 oz) (10/18 0445)  Weight change: -0.136 kg (-4.8 oz) Filed Weights   03/11/17 0500 03/12/17 0421 03/13/17 0445  Weight: 70.6 kg (155 lb 10.3 oz) 69.9 kg (154 lb 3.2 oz) 69.8 kg (153 lb 14.4 oz)    Intake/Output: I/O last 3 completed shifts: In: 1333.5 [P.O.:1200; I.V.:133.5] Out: 725 [Urine:725]   Intake/Output this shift:  Total I/O In: 600 [P.O.:600] Out: 0   Physical Exam: General: NAD, laying in bed, cachectic  Head: Normocephalic, atraumatic. Moist oral mucosal membranes  Eyes: Anicteric, PERRL  Neck: Supple, trachea midline  Lungs:  Basilar crackles  Heart: irregular  Abdomen:  Soft, nontender  Extremities: No peripheral edema.  Neurologic: Nonfocal, moving all four extremities  Skin: No lesions  GU: Foley with yellow urine    Basic Metabolic Panel:  Recent Labs Lab 03/09/17 1634 03/10/17 0521 03/11/17 0340 03/12/17 0503 03/13/17 0447  NA 141 141 138 137 135  K 4.6 4.5 4.4 4.1 3.9  CL 100* 103 102 99* 98*  CO2 26 26 26 26 23   GLUCOSE 141* 149* 106* 106* 101*  BUN 56* 71* 84* 98* 93*  CREATININE 2.59* 3.00* 3.98* 4.70* 5.08*  CALCIUM 9.9 9.0 8.7* 8.8* 8.7*  MG 2.6*  --   --   --   --   PHOS 6.0*  --   --   --   --     Liver Function Tests:  Recent Labs Lab 03/09/17 1634  AST 38  ALT 23  ALKPHOS 105  BILITOT 2.7*  PROT 8.3*  ALBUMIN 4.1    Recent Labs Lab 03/09/17 1634  LIPASE 37   No results for  input(s): AMMONIA in the last 168 hours.  CBC:  Recent Labs Lab 03/09/17 1634 03/10/17 0521 03/11/17 0340 03/12/17 0503 03/13/17 0447  WBC 6.4 5.3 5.8 6.1 5.1  HGB 13.8 12.0* 12.8* 13.1 12.6*  HCT 41.9 35.9* 38.2* 38.9* 38.0*  MCV 96.5 95.8 95.9 95.4 94.6  PLT 140* 109* 104* 118* 129*    Cardiac Enzymes:  Recent Labs Lab 03/09/17 1634  TROPONINI 0.05*    BNP: Invalid input(s): POCBNP  CBG:  Recent Labs Lab 03/09/17 2154  GLUCAP 146*    Microbiology: Results for orders placed or performed during the hospital encounter of 03/09/17  MRSA PCR Screening     Status: None   Collection Time: 03/09/17 10:28 PM  Result Value Ref Range Status   MRSA by PCR NEGATIVE NEGATIVE Final    Comment:        The GeneXpert MRSA Assay (FDA approved for NASAL specimens only), is one component of a comprehensive MRSA colonization surveillance program. It is not intended to diagnose MRSA infection nor to guide or monitor treatment for MRSA infections.     Coagulation Studies: No results for input(s): LABPROT, INR in the last 72 hours.  Urinalysis: No results for input(s): COLORURINE, LABSPEC, [place],  GLUCOSEU, HGBUR, BILIRUBINUR, KETONESUR, PROTEINUR, UROBILINOGEN, NITRITE, LEUKOCYTESUR in the last 72 hours.  Invalid input(s): APPERANCEUR    Imaging: US Renal  Result Date: 03/12/2017 CLINICAL DATA:  Chronic kidney disease stage IV, acute renal failure EXAM: RENAL / URINARY TRACT ULTRASOUND COMPLETE COMPARISON:  CT abdomen/pelvis dated 03/09/2017 FINDINGS: Right Kidney: Length: 9.6 cm.  Echogenic renal parenchyma.  No hydronephrosis. Left Kidney: Length: 10.0 cm.  Echogenic renal parenchyma.  No hydronephrosis. Bladder: Bladder is decompressed by indwelling Foley catheter. Additional comments: Bilateral pleural effusions. Abdominopelvic ascites. IMPRESSION: No hydronephrosis. Echogenic renal parenchyma, suggesting medical renal disease. Electronically Signed   By: Julian Hy M.D.   On: 03/12/2017 16:38     Medications:    . amiodarone  400 mg Oral Daily  . heparin  5,000 Units Subcutaneous Q8H   [DISCONTINUED] ondansetron **OR** ondansetron (ZOFRAN) IV  Assessment/ Plan:   [name] is a 83 y.o. white male with systolic congestive heart failure EF of 10-15%, hypertension, atrial fibrillation, BPH, cerebrovascular disease, who was admitted to [facility] on 03/09/2017   1. Acute renal failure on chronic kidney disease stage IV with proteinuria: nonoliguric.  Baseline creatinine of 2.2, GFR of 28 on 10/12 Acute renal failure from ATN, prerenal azotemia, overdiuresis and/or acute cardiorenal syndrome.  Would not recommend IV fluids due to cardiac function.  Will monitor and encourage patient with PO intake No acute indication for dialysis. Patient is unsure he would want dialysis Holding furosemide, metolazone and potassium.   2. Hypotension: with systolic congestive heart failure EF 10-15% and atrial fibrillation. Hypotensive. Holding all diuretics and continue to monitor.  Discontinued carvedilol and tamsulosin - Continue amiodarone - Appreciate cards input  3. Obstructive uropathy: foley catheter placed. Previously was following with Dr. Pilar Jarvis, [place].  Renal ultrasound reviewed.  - Holding tamsulosin - Will need outpatient follow up with urology.     LOS: 4 Ruben Hudson 10/18/201810:20 AM

## 2017-03-13 NOTE — Progress Notes (Signed)
[facility] [place] NAME: Ruben Hudson    MR#:  [id number]  DATE OF BIRTH:  Feb 24, 1926  SUBJECTIVE:    patient tolerating full liquids today Wife at bedside, renal func is worsening.  Upset- that he could not go home today.  REVIEW OF SYSTEMS:    Review of Systems  Constitutional: Negative for fever, chills weight loss HENT: Negative for ear pain, nosebleeds, congestion, facial swelling, rhinorrhea, neck pain, neck stiffness and ear discharge.   Respiratory: Negative for cough, shortness of breath, wheezing  Cardiovascular: Negative for chest pain, palpitations and leg swelling.  Gastrointestinal: Negative for heartburn, abdominal pain, vomiting, diarrhea or consitpation Genitourinary: Negative for dysuria, urgency, frequency, hematuria Musculoskeletal: Negative for back pain or joint pain Neurological: Negative for dizziness, seizures, syncope, focal weakness,  numbness and headaches.  Hematological: Does not bruise/bleed easily.  Psychiatric/Behavioral: Negative for hallucinations, confusion, dysphoric mood   Tolerating Diet: clear liquid diet   DRUG ALLERGIES:  No Known Allergies  VITALS:  Blood pressure (!) 88/58, pulse 83, temperature 97.7 F (36.5 C), temperature source Oral, resp. rate 20, height 5\' 9"  (1.753 m), weight 69.8 kg (153 lb 14.4 oz), SpO2 99 %.  PHYSICAL EXAMINATION:  Constitutional: Appears well-developed and well-nourished. No distress. HENT: Normocephalic. Marland Kitchen Oropharynx is clear and moist.  Eyes: Conjunctivae and EOM are normal. PERRLA, no scleral icterus.  Neck: Normal ROM. Neck supple. No JVD. No tracheal deviation. CVS: IRR, IRR, S1/S2 +,2/6 SEM no gallops, no carotid bruit.  Pulmonary: Effort and breath sounds normal, no stridor, rhonchi, wheezes, rales.  Abdominal: Soft., + vental hernia palpable good bowel sounds no distension, tenderness, rebound or guarding.  Musculoskeletal: Normal range of motion.  No edema and no tenderness.  Neuro: Alert. CN 2-12 grossly intact. No focal deficits. Skin: Skin is warm and dry. No rash noted. Psychiatric: Normal mood and affect.    LABORATORY PANEL:   CBC  Recent Labs Lab 03/13/17 0447  WBC 5.1  HGB 12.6*  HCT 38.0*  PLT 129*   ------------------------------------------------------------------------------------------------------------------  Chemistries   Recent Labs Lab 03/09/17 1634  03/13/17 0447  NA 141  < > 135  K 4.6  < > 3.9  CL 100*  < > 98*  CO2 26  < > 23  GLUCOSE 141*  < > 101*  BUN 56*  < > 93*  CREATININE 2.59*  < > 5.08*  CALCIUM 9.9  < > 8.7*  MG 2.6*  --   --   AST 38  --   --   ALT 23  --   --   ALKPHOS 105  --   --   BILITOT 2.7*  --   --   < > = values in this interval not displayed. ------------------------------------------------------------------------------------------------------------------  Cardiac Enzymes  Recent Labs Lab 03/09/17 1634  TROPONINI 0.05*   ------------------------------------------------------------------------------------------------------------------  RADIOLOGY:  US Renal  Result Date: 03/12/2017 CLINICAL DATA:  Chronic kidney disease stage IV, acute renal failure EXAM: RENAL / URINARY TRACT ULTRASOUND COMPLETE COMPARISON:  CT abdomen/pelvis dated 03/09/2017 FINDINGS: Right Kidney: Length: 9.6 cm.  Echogenic renal parenchyma.  No hydronephrosis. Left Kidney: Length: 10.0 cm.  Echogenic renal parenchyma.  No hydronephrosis. Bladder: Bladder is decompressed by indwelling Foley catheter. Additional comments: Bilateral pleural effusions. Abdominopelvic ascites. IMPRESSION: No hydronephrosis. Echogenic renal parenchyma, suggesting medical renal disease. Electronically Signed   By: Julian Hy M.D.   On: 03/12/2017 16:38     ASSESSMENT AND PLAN:   83  y/o make with PAF, CKD stage 3 and HTN presents with abdominal pain and found to have small bowel obstruction with [name]  hernia.  1. Small bowel obstruction with resolved incarceration from umbilical hernia Surgery consultation appreciated Abdomen is soft and obstruction has resolved Management as per surgery Does not need to undergo surgery Upgrade the diet.  2. Rapid atrial fibrillation: Heart rate has improved on amiodarone and Coreg  transitioned to oral amiodarone Resume eliquis now.  3. Acute on chronic systolic heart failure with ejection fraction less than 10-15% by last echo in March Watch for fluid overload Monitor intake and output with daily weight Cardiology consultation Appreciated. Patient appears to be euvolemic and therefore no need for continuing IV Lasix.  4. BPH: Continue Flomax d/c today due to hypotension.  5. Elevated troponin with baseline chronic elevation troponin   Patient has ruled out for ACS.  6.acute on chronic kidney disease stage III: Creatinine has increased in the setting of above issues Nephrology consultation appreciated. BMP for a.m. Hold nephrotoxic medications Pt have hypotension, encourage oral intake, stopped tamsulosin also.   Added midodrine today.  Physical therapy consultation for discharge planningrequested. Palliative care consult.  Management plans discussed with the patient and wife and they are in agreement.  CODE STATUS: FULl  TOTAL TIME TAKING CARE OF THIS PATIENT: 32 minutes.    POSSIBLE D/C 2-3 days, DEPENDING ON CLINICAL CONDITION.   [name] M.D on 03/13/2017 at 2:34 PM  Between 7am to 6pm - Pager - [phone] After 6pm go to [email or web address] - password [email or web address] [facility]  Office  [phone]  CC: Primary care physician; Isaias Cowman, MD  Note: This dictation was prepared with Dragon dictation along with smaller phrase technology. Any transcriptional errors that result from this process are unintentional.

## 2017-03-13 NOTE — Consult Note (Signed)
Consultation Note Date: 03/13/2017   Patient Name: Ruben Sellman Sr.  DOB: 11-25-1925  MRN: [id number]  Age / Sex: 83 y.o., male  PCP: Ruben Cowman, MD Referring Physician: Vaughan Hudson, *  Reason for Consultation: Establishing goals of care  HPI/Patient Profile: 83 y.o. male  with past medical history of systolic congestive heart failure with EF 10-15% on home oxygen 2L, hypertension, hyperlipidemia, atrial fibrillation, BPH with urinary obstruction, and chronic kidney disease stage IV admitted on 03/09/2017 with abdominal pain. In ED, found to have a small bowel obstruction with incarcerated umbilical hernia. Surgery consulted and NGT placed. Patient not a candidate for surgery. SBO managed medically and has now resolved. Cardiology consulted for rapid atrial fibrillation and congestive heart failure. Nephrology consulted for acute on chronic kidney disease with worsening kidney functions. Diuretics on hold. No indication for dialysis at this time. Foley catheter placed for obstructive uropathy. Will need outpatient urology f/u. Palliative medicine consultation for goals of care.     Clinical Assessment and Goals of Care: I have reviewed medical records, discussed with care team, and met with patient and wife Chief Operating Officer) at bedside to discuss diagnosis, [place], EOL wishes, disposition and options. Patient awake, alert, and oriented. Sitting up comfortably in recliner.   Introduced Palliative Medicine as specialized medical care for people living with serious illness. It focuses on providing relief from the symptoms and stress of a serious illness. The goal is to improve quality of life for both the patient and the family.  We discussed a brief life review of the patient. Married to second wife, Ruben Hudson, for 42 years. He has 5 children that live in different areas of the country. 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 Traveled around the country/world for work until he retired. Prior to hospitalization, patient living home with wife and fairly independent. Able to perform ADL's with minimal assist. Does wear 2L [place] continuously.   Ruben Hudson tells me he was diagnosed with heart failure in 2014. Ruben Hudson has had a decline in health since this last February, with increased shortness of breath and swelling. Also around the time he was prescribed oxygen. He speaks of his functional limitations with progressive heart failure and frustrations with not being able to travel anymore. Up until February, they were still traveling internationally. They had a national parks trip planned this past April and multiple doctors advised against him travelling.   Discussed hospital diagnoses and interventions. Discussed disease trajectory of CHF and that lungs/kidneys are affected due to weak heart. Patient states "I just want to know when I will be well enough to travel again." Explained my concern that he may not be able to safely travel far distances any longer due to progressive heart failure now compromising his kidneys. Explained medical management of heart failure.   Advanced directives, concepts specific to code status, and artifical feeding and hydration were discussed. Patient has a documented living will not on file. Encouraged wife to bring documentation to hospital to be scanned into  epic. Ruben Hudson is HCPOA and his daughter is secondary HCPOA. Ruben Hudson does not answer when I ask his thoughts on resuscitation/life support if his heart was to stop. He again speaks of having a living will and funeral casket already arranged.   Wife requests they review living will. Hard Choices copy given. Educated on MOST form. Encouraged Mr. And Mrs. Whitebread to discuss and document big decisions they are faced with in the future with chronic, progressive heart failure. We did speak of dialysis and that he likely would NOT  want dialysis or be a candidate for dialysis due to age and CHF.   Palliative Care services outpatient were explained and offered. Ruben Hudson asks about support that could prevent them coming back and forth to the hospital. I then explained hospice services that would facilitate him being home at EOL and prevent re-hospitalization for recurrent CHF exacerbation. Peggy agreeable with palliative services to follow on discharge understanding this can transition to hospice services upon their request.   Patient and wife share many stories of their travels around the world. Ruben Hudson also shares how his wife/children threw him a surprise birthday party last weekend for his 89th birthday. "I have a lot to look forward to still."   Questions and concerns were addressed. Therapeutic listening and emotional support provided.   SUMMARY OF RECOMMENDATIONS    Continue FULL code/FULL scope.   Patient has a living will and documented HCPOA. Encouraged wife to bring to the hospital to be scanned.   Educated on MOST form and encouraged they complete during hospitalization or with any healthcare provider.   Likely home with home health PT.   Patient/wife agreeable with continued f/u from outpatient palliative on discharge.   PMT will continue to support patient/wife through hospitalization.    Code Status/Advance Care Planning:  Full code-educated on recommendation for DNR with age and CHF. Wife requests they review living will.   Symptom Management:   Per attending  Palliative Prophylaxis:   Bowel Regimen and Delirium Protocol  Psycho-social/Spiritual:   Desire for further Chaplaincy support: no  Additional Recommendations: Caregiving  Support/Resources and Education on Hospice  Prognosis:   Unable to determine: guarded with congestive heart failure EF 10-15% and chronic kidney disease with worsening kidney functions.  Discharge Planning: To Be Determined likely home with PT. Recommend  palliative services to follow on d/c.      Primary Diagnoses: Present on Admission: . Atrial fibrillation with RVR ([place]) . SBO (small bowel obstruction) ([place]) . Acute on chronic systolic CHF (congestive heart failure) ([place]) . Chronic kidney disease, stage 3 ([place]) . Hyperlipidemia   I have reviewed the medical record, interviewed the patient and family, and examined the patient. The following aspects are pertinent.  Past Medical History:  Diagnosis Date  . Acute pulmonary edema ([place]) 02/26/2013  . Benign prostatic hyperplasia with urinary obstruction 02/24/2012  . BP (high blood pressure) 10/31/2015  . Cataract extraction status of left eye   . Cataract extraction status of right eye   . Cerebrovascular accident (CVA) ([place]) 04/24/2012  . Chronic kidney disease 02/16/2014   stage 2  . Congestive heart failure ([place]) 10/31/2015  . HLD (hyperlipidemia) 10/31/2015  . Hydrocele 02/25/2012  . Unstable angina pectoris ([place]) 02/26/2013  . Urge incontinence of urine 05/30/2014  . Urinary urgency 10/31/2015   Social History   Social History  . Marital status: Married    Spouse name: N/A  . Number of children: N/A  . Years of education: N/A  Social History Main Topics  . Smoking status: Former Smoker    Quit date: 11/03/1954  . Smokeless tobacco: Never Used  . Alcohol use 4.8 oz/week    7 Shots of liquor, 1 Standard drinks or equivalent per week     Comment: 1 shot of gin a day.  . Drug use: No  . Sexual activity: Not Asked   Other Topics Concern  . None   Social History Narrative  . None   Family History  Problem Relation Age of Onset  . Kidney cancer Neg Hx   . Prostate cancer Neg Hx    Scheduled Meds: . amiodarone  400 mg Oral Daily  . heparin  5,000 Units Subcutaneous Q8H   Continuous Infusions: PRN Meds:.[DISCONTINUED] ondansetron **OR** ondansetron (ZOFRAN) IV Medications Prior to Admission:  Prior to Admission medications   Medication Sig Start Date End Date Taking?  Authorizing Provider  ALPRAZolam Duanne Moron) 0.25 MG tablet Take 0.25 mg by mouth at bedtime as needed for sleep. 08/19/16  Yes [provider]  apixaban (ELIQUIS) 2.5 MG TABS tablet Take 1 tablet (2.5 mg total) by mouth 2 (two) times daily. 08/25/16  Yes [name], MD  aspirin EC 81 MG EC tablet Take 1 tablet (81 mg total) by mouth daily. 08/25/16  Yes [name], MD  atorvastatin (LIPITOR) 10 MG tablet Take 10 mg by mouth daily.    Yes [provider]  carvedilol (COREG) 3.125 MG tablet Take 3.125 mg by mouth 2 (two) times daily with a meal. 08/19/16  Yes [provider]  ENTRESTO 24-26 MG Take 1 tablet by mouth every 12 (twelve) hours. 02/01/17  Yes [provider]  furosemide (LASIX) 20 MG tablet Take 1 tablet (20 mg total) by mouth 2 (two) times daily. Patient taking differently: Take 40 mg by mouth daily.  08/25/16  Yes [name], MD  KLOR-CON M10 10 MEQ tablet Take 10 mEq by mouth daily. 03/01/17  Yes [provider]  tamsulosin (FLOMAX) 0.4 MG CAPS capsule Take 0.4 mg by mouth. In am.   Yes [provider]  betamethasone dipropionate (DIPROLENE) 0.05 % cream Apply topically 2 (two) times daily. Patient not taking: Reported on 02/15/2016 11/29/15   Zara Council A, PA-C  betamethasone dipropionate (DIPROLENE) 0.05 % cream Apply topically 2 (two) times daily. Patient not taking: Reported on 08/23/2016 02/15/16   Zara Council A, PA-C  docusate sodium (COLACE) 100 MG capsule Take 1 capsule (100 mg total) by mouth 2 (two) times daily. Patient not taking: Reported on 02/15/2016 11/13/15   Hollice Espy, MD   No Known Allergies Review of Systems  Constitutional: Positive for activity change.  Respiratory: Positive for shortness of breath.   Neurological: Positive for weakness.   Physical Exam  Constitutional: He is oriented to person, place, and time. He is cooperative.  HENT:  Head: Normocephalic and atraumatic.  Cardiovascular: Regular rhythm.    Pulmonary/Chest: No accessory muscle usage. No tachypnea. No respiratory distress. He has decreased breath sounds.  Abdominal: Normal appearance. There is no tenderness.  Neurological: He is alert and oriented to person, place, and time.  Skin: Skin is warm and dry.  Psychiatric: He has a normal mood and affect. His speech is normal and behavior is normal. Cognition and memory are normal.  Nursing note and vitals reviewed.  Vital Signs: BP (!) 104/49 (BP Location: Right Arm)   Pulse (!) 44   Temp (!) 97.4 F (36.3 C) (Oral)   Resp 18  Ht '5\' 9"'  (1.753 m)   Wt 69.8 kg (153 lb 14.4 oz)   SpO2 91%   BMI 22.73 kg/m  Pain Assessment: No/denies pain   Pain Score: 0-No pain  SpO2: SpO2: 91 % O2 Device:SpO2: 91 % O2 Flow Rate: .O2 Flow Rate (L/min): 2 L/min  IO: Intake/output summary:   Intake/Output Summary (Last 24 hours) at 03/13/17 1020 Last data filed at 03/13/17 1016  Gross per 24 hour  Intake          1850.01 ml  Output              625 ml  Net          1225.01 ml    LBM: Last BM Date: 03/13/17 Baseline Weight: Weight: 68.9 kg (152 lb) Most recent weight: Weight: 69.8 kg (153 lb 14.4 oz)     Palliative Assessment/Data: PPS 60%   Flowsheet Rows     Most Recent Value  Intake Tab  Referral Department  Hospitalist  Unit at Time of Referral  Cardiac/Telemetry Unit  Palliative Care Primary Diagnosis  -- [facility], cardiorenal syndrome]  Palliative Care Type  New Palliative care  Reason for referral  Clarify Goals of Care  Date first seen by Palliative Care  03/13/17  Clinical Assessment  Palliative Performance Scale Score  60%  Psychosocial & Spiritual Assessment  Palliative Care Outcomes  Patient/Family meeting held?  Yes  Who was at the meeting?  patient and wife  Palliative Care Outcomes  Clarified goals of care, Provided psychosocial or spiritual support, Linked to palliative care logitudinal support, ACP counseling assistance, Provided end of life care assistance        Time In: 0850 Time Out: 1010 Time Total: 61mn Greater than 50%  of this time was spent counseling and coordinating care related to the above assessment and plan.  Signed by:  MIhor Dow FNP-C Palliative Medicine Team  Phone: [phone]: [phone]  Please contact Palliative Medicine Team phone at [phone] questions and concerns.  For individual provider: See AShea Evans

## 2017-03-14 LAB — RENAL FUNCTION PANEL
ANION GAP: 12 (ref 5–15)
Albumin: 3.5 g/dL (ref 3.5–5.0)
BUN: 97 mg/dL — ABNORMAL HIGH (ref 6–20)
CALCIUM: 9 mg/dL (ref 8.9–10.3)
CO2: 25 mmol/L (ref 22–32)
Chloride: 100 mmol/L — ABNORMAL LOW (ref 101–111)
Creatinine, Ser: 5.67 mg/dL — ABNORMAL HIGH (ref 0.61–1.24)
GFR calc non Af Amer: 8 mL/min — ABNORMAL LOW (ref >60)
GFR, EST AFRICAN AMERICAN: 9 mL/min — AB (ref >60)
Glucose, Bld: 102 mg/dL — ABNORMAL HIGH (ref 65–99)
PHOSPHORUS: 6.6 mg/dL — AB (ref 2.5–4.6)
Potassium: 4 mmol/L (ref 3.5–5.1)
SODIUM: 137 mmol/L (ref 135–145)

## 2017-03-14 MED ORDER — ACETAMINOPHEN 325 MG PO TABS
650.0000 mg | ORAL_TABLET | Freq: Four times a day (QID) | ORAL | Status: DC | PRN
  Administered 2017-03-14 – 2017-03-19 (×7): 650 mg via ORAL
  Filled 2017-03-14 (×7): qty 2

## 2017-03-14 NOTE — Progress Notes (Signed)
[place] [facility] [place] NAME: Ruben Hudson    MR#:  [id number]  DATE OF BIRTH:  12/01/1925  SUBJECTIVE:    patient tolerating full liquids today Wife at bedside, renal func is worsening.  Upset- that he could not go home today.  He and his wife agreed to have HD, if and when needed.  REVIEW OF SYSTEMS:    Review of Systems  Constitutional: Negative for fever, chills weight loss HENT: Negative for ear pain, nosebleeds, congestion, facial swelling, rhinorrhea, neck pain, neck stiffness and ear discharge.   Respiratory: Negative for cough, shortness of breath, wheezing  Cardiovascular: Negative for chest pain, palpitations and leg swelling.  Gastrointestinal: Negative for heartburn, abdominal pain, vomiting, diarrhea or consitpation Genitourinary: Negative for dysuria, urgency, frequency, hematuria Musculoskeletal: Negative for back pain or joint pain Neurological: Negative for dizziness, seizures, syncope, focal weakness,  numbness and headaches.  Hematological: Does not bruise/bleed easily.  Psychiatric/Behavioral: Negative for hallucinations, confusion, dysphoric mood   Tolerating Diet: clear liquid diet   DRUG ALLERGIES:  No Known Allergies  VITALS:  Blood pressure 108/69, pulse 89, temperature (!) 97.5 F (36.4 C), temperature source Oral, resp. rate 16, height 5\' 9"  (1.753 m), weight 70.8 kg (156 lb 1.6 oz), SpO2 94 %.  PHYSICAL EXAMINATION:  Constitutional: Appears well-developed and well-nourished. No distress. HENT: Normocephalic. Marland Kitchen Oropharynx is clear and moist.  Eyes: Conjunctivae and EOM are normal. PERRLA, no scleral icterus.  Neck: Normal ROM. Neck supple. No JVD. No tracheal deviation. CVS: IRR, IRR, S1/S2 +,2/6 SEM no gallops, no carotid bruit.  Pulmonary: Effort and breath sounds normal, no stridor, rhonchi, wheezes, rales.  Abdominal: Soft., + vental hernia palpable good bowel sounds no distension, tenderness, rebound  or guarding.  Musculoskeletal: Normal range of motion. No edema and no tenderness.  Neuro: Alert. CN 2-12 grossly intact. No focal deficits. Generalized weakness. Skin: Skin is warm and dry. No rash noted. Psychiatric: Normal mood and affect.    LABORATORY PANEL:   CBC  Recent Labs Lab 03/13/17 0447  WBC 5.1  HGB 12.6*  HCT 38.0*  PLT 129*   ------------------------------------------------------------------------------------------------------------------  Chemistries   Recent Labs Lab 03/09/17 1634  03/14/17 0653  NA 141  < > 137  K 4.6  < > 4.0  CL 100*  < > 100*  CO2 26  < > 25  GLUCOSE 141*  < > 102*  BUN 56*  < > 97*  CREATININE 2.59*  < > 5.67*  CALCIUM 9.9  < > 9.0  MG 2.6*  --   --   AST 38  --   --   ALT 23  --   --   ALKPHOS 105  --   --   BILITOT 2.7*  --   --   < > = values in this interval not displayed. ------------------------------------------------------------------------------------------------------------------  Cardiac Enzymes  Recent Labs Lab 03/09/17 1634  TROPONINI 0.05*   ------------------------------------------------------------------------------------------------------------------  RADIOLOGY:  No results found.   ASSESSMENT AND PLAN:   83 y/o make with PAF, CKD stage 3 and HTN presents with abdominal pain and found to have small bowel obstruction with [name] hernia.  1. Small bowel obstruction with resolved incarceration from umbilical hernia Surgery consultation appreciated Abdomen is soft and obstruction has resolved Management as per surgery Does not need to undergo surgery Upgrade the diet. Tolerating soft diet.  2. Rapid atrial fibrillation:  Heart rate has improved on amiodarone and Coreg  transitioned to  oral amiodarone  Resumed eliquis , but stopped due to worsening renal func.  3. Acute on chronic systolic heart failure with ejection fraction less than 10-15% by last echo in March Watch for fluid  overload Monitor intake and output with daily weight Cardiology consultation Appreciated. Patient appears to be euvolemic and therefore no need for continuing IV Lasix.  4. BPH: Continue Flomax d/c due to hypotension.  5. Elevated troponin with baseline chronic elevation troponin   Patient has ruled out for ACS.  6.acute on chronic kidney disease stage III: Creatinine has increased in the setting of above issues Nephrology consultation appreciated. BMP for a.m. Hold nephrotoxic medications Pt have hypotension, encourage oral intake, stopped tamsulosin also.   Added midodrine.  pt and his wife chose to have HD , if needed. Monitor in hospital, my need HD soon.  Physical therapy consultation for discharge planning requested. Palliative care consult.  Management plans discussed with the patient and wife and they are in agreement.  CODE STATUS: FULl  TOTAL TIME TAKING CARE OF THIS PATIENT: 30 minutes.    POSSIBLE D/C 2-3 days, DEPENDING ON CLINICAL CONDITION.   [name] M.D on 03/14/2017 at 5:35 PM  Between 7am to 6pm - Pager - [phone] After 6pm go to [email or web address] - password [email or web address] [facility]  Office  [phone]  CC: Primary care physician; Isaias Cowman, MD  Note: This dictation was prepared with Dragon dictation along with smaller phrase technology. Any transcriptional errors that result from this process are unintentional.

## 2017-03-14 NOTE — Progress Notes (Signed)
Daily Progress Note   Patient Name: Ruben Nikolai Sr.       Date: 03/14/2017 DOB: [birth year]  Age: 83 y.o. MRN#: [id number] Attending Physician: [name], * Primary Care Physician: Isaias Cowman, MD Admit Date: 03/09/2017  Reason for Consultation/Follow-up: Establishing goals of care  Subjective/GOC: Patient sitting up in chair this afternoon. Denies pain or discomfort. Slept better last night.   Wife at bedside. They have spoke with Dr. Juleen China this morning and understand his kidney function is worse today, also a reason he cannot yet go home. Wife tells me the dialysis educator is coming to speak with them this afternoon.   Asked Mr. Veron his thoughts on dialysis. Mr. Lavell speaks of wanting to live till 87 and doing what needs to be done to live longer. He would rather "feel like shit" but "alive to talk about it."   We again discussed his living will and wishes regarding aggressive medical interventions, such as resuscitation/life support. Patient and wife tell me he DOES want resuscitation attempted but would not want long-term life support/aggressive measures. Encouraged they read [facility] Cho[facility] copy given and continue conversations regarding his wishes.  Patient and wife again share many stories of their travels around the world.  Length of Stay: 5  Current Medications: Scheduled Meds:  . amiodarone  400 mg Oral Daily  . apixaban  2.5 mg Oral BID  . midodrine  2.5 mg Oral TID WC  . sodium chloride flush  3 mL Intravenous Q12H    Continuous Infusions:   PRN Meds: [DISCONTINUED] ondansetron **OR** ondansetron (ZOFRAN) IV  Physical Exam  Constitutional: He is oriented to person, place, and time. He is cooperative.  Pulmonary/Chest: Effort normal.    Neurological: He is alert and oriented to person, place, and time.  Nursing note and vitals reviewed.          Vital Signs: BP 112/78 (BP Location: Left Arm)   Pulse 91   Temp (!) 97.5 F (36.4 C) (Oral)   Resp 18   Ht 5\' 9"  (1.753 m)   Wt 70.8 kg (156 lb 1.6 oz)   SpO2 93%   BMI 23.05 kg/m  SpO2: SpO2: 93 % O2 Device: O2 Device: Nasal Cannula O2 Flow Rate: O2 Flow Rate (L/min): 2 L/min  Intake/output summary:   Intake/Output Summary (Last 24  hours) at 03/14/17 1323 Last data filed at 03/14/17 8841  Gross per 24 hour  Intake             1080 ml  Output              150 ml  Net              930 ml   LBM: Last BM Date: 03/13/17 Baseline Weight: Weight: 68.9 kg (152 lb) Most recent weight: Weight: 70.8 kg (156 lb 1.6 oz)       Palliative Assessment/Data: PPS 60%    Flowsheet Rows     Most Recent Value  Intake Tab  Referral Department  Hospitalist  Unit at Time of Referral  Cardiac/Telemetry Unit  Palliative Care Primary Diagnosis  -- [facility], cardiorenal syndrome]  Palliative Care Type  New Palliative care  Reason for referral  Clarify Goals of Care  Date first seen by Palliative Care  03/13/17  Clinical Assessment  Palliative Performance Scale Score  60%  Psychosocial & Spiritual Assessment  Palliative Care Outcomes  Patient/Family meeting held?  Yes  Who was at the meeting?  patient and wife  Palliative Care Outcomes  Clarified goals of care, Provided psychosocial or spiritual support, Linked to palliative care logitudinal support, ACP counseling assistance, Provided end of life care assistance      Patient Active Problem List   Diagnosis Date Noted  . Palliative care by specialist   . Goals of care, counseling/discussion   . Atrial fibrillation with RVR ([place]) 03/09/2017  . Recurrent umbilical hernia with incarceration   . Small bowel obstruction ([place])   . Acute on chronic systolic CHF (congestive heart failure) ([place]) 08/23/2016  . CKD (chronic kidney  disease), stage III ([place]) 08/23/2016  . Elevated troponin 08/23/2016  . Thrombocytopenia ([place]) 08/23/2016  . Hyperkalemia 11/06/2015  . Urinary urgency 10/31/2015  . Chronic kidney disease, stage 3 ([place]) 10/31/2015  . Congestive heart failure ([place]) 10/31/2015  . Hyperlipidemia 10/31/2015  . BP (high blood pressure) 10/31/2015  . Cerebrovascular accident (CVA) ([place]) 10/31/2015  . Phimosis 10/31/2015  . 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 Benign fibroma of prostate 05/30/2014  . Urge incontinence of urine 05/30/2014  . Chronic kidney disease, stage IV (severe) ([place]) 02/16/2014  . Acute pulmonary edema ([place]) 02/26/2013  . Unstable angina pectoris ([place]) 02/26/2013  . Hydrocele 02/25/2012  . Benign prostatic hyperplasia with urinary obstruction 02/24/2012    Palliative Care Assessment & Plan   Patient Profile: 83 y.o. male  with past medical history of systolic congestive heart failure with EF 10-15% on home oxygen 2L, hypertension, hyperlipidemia, atrial fibrillation, BPH with urinary obstruction, and chronic kidney disease stage IV admitted on 03/09/2017 with abdominal pain. In ED, found to have a small bowel obstruction with incarcerated umbilical hernia. Surgery consulted and NGT placed. Patient not a candidate for surgery. SBO managed medically and has now resolved. Cardiology consulted for rapid atrial fibrillation and congestive heart failure. Nephrology consulted for acute on chronic kidney disease with worsening kidney functions. Diuretics on hold. No indication for dialysis at this time. Foley catheter placed for obstructive uropathy. Will need outpatient urology f/u. Palliative medicine consultation for goals of care.     Assessment: Small bowel obstruction resolved Afib RVR Acute on chronic CHF with EF 10-15% Acute on chronic kidney disease BPH Elevated tropoinin  Recommendations/Plan:  FULL code/FULL scope  Confirms he does want resuscitation attempted  but would not want long-term life support if not  improving. Wife is HCPOA.   Dialysis educator pending today. Patient will likely pursue dialysis if necessary and if he is a candidate.  Patient/wife agreeable with outpatient palliative f/u.   Goals of Care and Additional Recommendations:  Limitations on Scope of Treatment: Full Scope Treatment  Code Status:  FULL   Code Status Orders        Start     Ordered   03/09/17 2148  Full code  Continuous     03/09/17 2147    Code Status History    Date Active Date Inactive Code Status Order ID Comments User Context   08/23/2016  7:48 PM 08/25/2016  2:52 PM Full Code [id number]  Theodoro Grist, MD Inpatient    Advance Directive Documentation     Most Recent Value  Type of Advance Directive  Living will  Pre-existing out of facility DNR order (yellow form or pink MOST form)  -  "MOST" Form in Place?  -       Prognosis:   Unable to determine  Discharge Planning:  To Be Determined  Care plan was discussed with patient, wife, Dr. Juleen China  Thank you for allowing the Palliative Medicine Team to assist in the care of this patient.   Time In: 1120 Time Out: 1205 Total Time 79min Prolonged Time Billed no       Greater than 50%  of this time was spent counseling and coordinating care related to the above assessment and plan.  Ihor Dow, FNP-C Palliative Medicine Team  Phone: [phone] Fax: [phone]  Please contact Palliative Medicine Team phone at [phone] for questions and concerns.

## 2017-03-14 NOTE — Plan of Care (Signed)
Problem: Safety: Goal: Ability to remain free from injury will improve Outcome: Not Progressing Patient is unsteady on his feet but spouse has refused bed alarm but will remain at his bedside and will transfer him

## 2017-03-14 NOTE — Progress Notes (Signed)
[facility]  ROUNDING NOTE   Subjective:   Wife at bedside. Patient wants to go home. However worsening renal function  Creatinine 5.67 (5.08) (4.7) (3.98)  Feels well.   Objective:  Vital signs in last 24 hours:  Temp:  [97.4 F (36.3 C)-97.8 F (36.6 C)] 97.5 F (36.4 C) (10/19 0800) Pulse Rate:  [83-93] 91 (10/19 0519) Resp:  [18-20] 18 (10/19 0800) BP: (88-112)/(58-78) 112/78 (10/19 0800) SpO2:  [91 %-99 %] 93 % (10/19 0800) Weight:  [70.8 kg (156 lb 1.6 oz)] 70.8 kg (156 lb 1.6 oz) (10/19 0519)  Weight change: 0.998 kg (2 lb 3.2 oz) Filed Weights   03/12/17 0421 03/13/17 0445 03/14/17 0519  Weight: 69.9 kg (154 lb 3.2 oz) 69.8 kg (153 lb 14.4 oz) 70.8 kg (156 lb 1.6 oz)    Intake/Output: I/O last 3 completed shifts: In: 1200 [P.O.:1200] Out: 775 [Urine:775]   Intake/Output this shift:  Total I/O In: 480 [P.O.:480] Out: -   Physical Exam: General: NAD, sitting chair  Head: Normocephalic, atraumatic. Moist oral mucosal membranes  Eyes: Anicteric, PERRL  Neck: Supple, trachea midline  Lungs:  clear  Heart: irregular  Abdomen:  Soft, nontender  Extremities: No peripheral edema.  Neurologic: Nonfocal, moving all four extremities  Skin: No lesions  GU: Foley with yellow urine    Basic Metabolic Panel:  Recent Labs Lab 03/09/17 1634 03/10/17 0521 03/11/17 0340 03/12/17 0503 03/13/17 0447 03/14/17 0653  NA 141 141 138 137 135 137  K 4.6 4.5 4.4 4.1 3.9 4.0  CL 100* 103 102 99* 98* 100*  CO2 26 26 26 26 23 25   GLUCOSE 141* 149* 106* 106* 101* 102*  BUN 56* 71* 84* 98* 93* 97*  CREATININE 2.59* 3.00* 3.98* 4.70* 5.08* 5.67*  CALCIUM 9.9 9.0 8.7* 8.8* 8.7* 9.0  MG 2.6*  --   --   --   --   --   PHOS 6.0*  --   --   --   --  6.6*    Liver Function Tests:  Recent Labs Lab 03/09/17 1634 03/14/17 0653  AST 38  --   ALT 23  --   ALKPHOS 105  --   BILITOT 2.7*  --   PROT 8.3*  --   ALBUMIN 4.1 3.5    Recent Labs Lab  03/09/17 1634  LIPASE 37   No results for input(s): AMMONIA in the last 168 hours.  CBC:  Recent Labs Lab 03/09/17 1634 03/10/17 0521 03/11/17 0340 03/12/17 0503 03/13/17 0447  WBC 6.4 5.3 5.8 6.1 5.1  HGB 13.8 12.0* 12.8* 13.1 12.6*  HCT 41.9 35.9* 38.2* 38.9* 38.0*  MCV 96.5 95.8 95.9 95.4 94.6  PLT 140* 109* 104* 118* 129*    Cardiac Enzymes:  Recent Labs Lab 03/09/17 1634  TROPONINI 0.05*    BNP: Invalid input(s): POCBNP  CBG:  Recent Labs Lab 03/09/17 2154  GLUCAP 146*    Microbiology: Results for orders placed or performed during the hospital encounter of 03/09/17  MRSA PCR Screening     Status: None   Collection Time: 03/09/17 10:28 PM  Result Value Ref Range Status   MRSA by PCR NEGATIVE NEGATIVE Final    Comment:        The GeneXpert MRSA Assay (FDA approved for NASAL specimens only), is one component of a comprehensive MRSA colonization surveillance program. It is not intended to diagnose MRSA infection nor to guide or monitor treatment for MRSA infections.  Coagulation Studies: No results for input(s): LABPROT, INR in the last 72 hours.  Urinalysis: No results for input(s): COLORURINE, LABSPEC, PHURINE, GLUCOSEU, HGBUR, BILIRUBINUR, KETONESUR, PROTEINUR, UROBILINOGEN, NITRITE, LEUKOCYTESUR in the last 72 hours.  Invalid input(s): APPERANCEUR    Imaging: US Renal  Result Date: 03/12/2017 CLINICAL DATA:  Chronic kidney disease stage IV, acute renal failure EXAM: RENAL / URINARY TRACT ULTRASOUND COMPLETE COMPARISON:  CT abdomen/pelvis dated 03/09/2017 FINDINGS: Right Kidney: Length: 9.6 cm.  Echogenic renal parenchyma.  No hydronephrosis. Left Kidney: Length: 10.0 cm.  Echogenic renal parenchyma.  No hydronephrosis. Bladder: Bladder is decompressed by indwelling Foley catheter. Additional comments: Bilateral pleural effusions. Abdominopelvic ascites. IMPRESSION: No hydronephrosis. Echogenic renal parenchyma, suggesting medical renal  disease. Electronically Signed   By: Julian Hy M.D.   On: 03/12/2017 16:38     Medications:    . amiodarone  400 mg Oral Daily  . apixaban  2.5 mg Oral BID  . midodrine  2.5 mg Oral TID WC  . sodium chloride flush  3 mL Intravenous Q12H   [DISCONTINUED] ondansetron **OR** ondansetron (ZOFRAN) IV  Assessment/ Plan:   Mr. Ruben Rylee Sr. is a 83 y.o. white male with systolic congestive heart failure EF of 10-15%, hypertension, atrial fibrillation, BPH, cerebrovascular disease, who was admitted to [facility] on 03/09/2017   1. Acute renal failure on chronic kidney disease stage IV with proteinuria: nonoliguric.  Baseline creatinine of 2.2, GFR of 28 on 10/12 Acute renal failure from ATN, prerenal azotemia, overdiuresis and/or acute cardiorenal syndrome.  Would not recommend IV fluids due to cardiac function.  Will monitor and encourage patient with PO intake No acute indication for dialysis. Patient is unsure he would want dialysis Holding furosemide, metolazone and potassium.  But renal function is worse. Recommend monitoring renal function over the weekend - Dialysis education today.   2. Hypotension: with systolic congestive heart failure EF 10-15% and atrial fibrillation. Hypotensive. Holding all diuretics and continue to monitor.  Discontinued carvedilol and tamsulosin - Appreciate cards input  3. Obstructive uropathy: foley catheter placed. Previously was following with Dr. Pilar Jarvis, [place].  Renal ultrasound reviewed.  - Holding tamsulosin - Will need outpatient follow up with urology.     LOS: [place], Ruben Hudson 10/19/201810:22 AM

## 2017-03-14 NOTE — Progress Notes (Addendum)
New referral for Palliative to follow at home received from [facility]. for Discharge date undetermined. Referral made aware. Thank you. Flo Shanks RN, BSN, [facility], hospital liaison [phone] c

## 2017-03-14 NOTE — Progress Notes (Signed)
PT Cancellation Note  Patient Details Name: Ruben Kohlmann Sr. MRN: [id number] DOB: 1925-07-24   Cancelled Treatment:    Reason Eval/Treat Not Completed: Patient declined, no reason specified; Pt declined PT services this date secondary to fatigue.  Will attempt to see pt at a future date and time as medically appropriate.    Linus Salmons PT, DPT 03/14/17, 2:40 PM

## 2017-03-15 LAB — RENAL FUNCTION PANEL
ALBUMIN: 3.4 g/dL — AB (ref 3.5–5.0)
ANION GAP: 11 (ref 5–15)
BUN: 95 mg/dL — AB (ref 6–20)
CO2: 24 mmol/L (ref 22–32)
CREATININE: 5.88 mg/dL — AB (ref 0.61–1.24)
Calcium: 8.6 mg/dL — ABNORMAL LOW (ref 8.9–10.3)
Chloride: 99 mmol/L — ABNORMAL LOW (ref 101–111)
GFR calc Af Amer: 9 mL/min — ABNORMAL LOW (ref >60)
GFR calc non Af Amer: 8 mL/min — ABNORMAL LOW (ref >60)
GLUCOSE: 104 mg/dL — AB (ref 65–99)
PHOSPHORUS: 6.1 mg/dL — AB (ref 2.5–4.6)
Potassium: 4 mmol/L (ref 3.5–5.1)
SODIUM: 134 mmol/L — AB (ref 135–145)

## 2017-03-15 NOTE — Progress Notes (Signed)
[facility]  ROUNDING NOTE   Subjective:   Wife at bedside.   Epistaxis this morning   Received dialysis education yesterday.  Objective:  Vital signs in last 24 hours:  Temp:  [97.5 F (36.4 C)-97.8 F (36.6 C)] 97.5 F (36.4 C) (10/20 0800) Pulse Rate:  [63-89] 88 (10/20 0800) Resp:  [15-18] 15 (10/20 0800) BP: (86-108)/(59-76) 102/73 (10/20 0800) SpO2:  [94 %-98 %] 94 % (10/20 0800) Weight:  [70.8 kg (156 lb)] 70.8 kg (156 lb) (10/20 0442)  Weight change: -0.045 kg (-1.6 oz) Filed Weights   03/13/17 0445 03/14/17 0519 03/15/17 0442  Weight: 69.8 kg (153 lb 14.4 oz) 70.8 kg (156 lb 1.6 oz) 70.8 kg (156 lb)    Intake/Output: I/O last 3 completed shifts: In: 1340 [P.O.:1340] Out: 650 [Urine:650]   Intake/Output this shift:  No intake/output data recorded.  Physical Exam: General: NAD, sitting chair  Head: Normocephalic, atraumatic. Moist oral mucosal membranes  Eyes: Anicteric, PERRL  Neck: Supple, trachea midline  Lungs:  clear  Heart: irregular  Abdomen:  Soft, nontender  Extremities: No peripheral edema.  Neurologic: Nonfocal, moving all four extremities  Skin: No lesions  GU: Foley with yellow urine    Basic Metabolic Panel:  Recent Labs Lab 03/09/17 1634  03/11/17 0340 03/12/17 0503 03/13/17 0447 03/14/17 0653 03/15/17 0444  NA 141  < > 138 137 135 137 134*  K 4.6  < > 4.4 4.1 3.9 4.0 4.0  CL 100*  < > 102 99* 98* 100* 99*  CO2 26  < > 26 26 23 25 24   GLUCOSE 141*  < > 106* 106* 101* 102* 104*  BUN 56*  < > 84* 98* 93* 97* 95*  CREATININE 2.59*  < > 3.98* 4.70* 5.08* 5.67* 5.88*  CALCIUM 9.9  < > 8.7* 8.8* 8.7* 9.0 8.6*  MG 2.6*  --   --   --   --   --   --   PHOS 6.0*  --   --   --   --  6.6* 6.1*  < > = values in this interval not displayed.  Liver Function Tests:  Recent Labs Lab 03/09/17 1634 03/14/17 0653 03/15/17 0444  AST 38  --   --   ALT 23  --   --   ALKPHOS 105  --   --   BILITOT 2.7*  --   --   PROT  8.3*  --   --   ALBUMIN 4.1 3.5 3.4*    Recent Labs Lab 03/09/17 1634  LIPASE 37   No results for input(s): AMMONIA in the last 168 hours.  CBC:  Recent Labs Lab 03/09/17 1634 03/10/17 0521 03/11/17 0340 03/12/17 0503 03/13/17 0447  WBC 6.4 5.3 5.8 6.1 5.1  HGB 13.8 12.0* 12.8* 13.1 12.6*  HCT 41.9 35.9* 38.2* 38.9* 38.0*  MCV 96.5 95.8 95.9 95.4 94.6  PLT 140* 109* 104* 118* 129*    Cardiac Enzymes:  Recent Labs Lab 03/09/17 1634  TROPONINI 0.05*    BNP: Invalid input(s): POCBNP  CBG:  Recent Labs Lab 03/09/17 2154  GLUCAP 146*    Microbiology: Results for orders placed or performed during the hospital encounter of 03/09/17  MRSA PCR Screening     Status: None   Collection Time: 03/09/17 10:28 PM  Result Value Ref Range Status   MRSA by PCR NEGATIVE NEGATIVE Final    Comment:        The GeneXpert MRSA Assay (FDA  approved for NASAL specimens only), is one component of a comprehensive MRSA colonization surveillance program. It is not intended to diagnose MRSA infection nor to guide or monitor treatment for MRSA infections.     Coagulation Studies: No results for input(s): LABPROT, INR in the last 72 hours.  Urinalysis: No results for input(s): COLORURINE, LABSPEC, PHURINE, GLUCOSEU, HGBUR, BILIRUBINUR, KETONESUR, PROTEINUR, UROBILINOGEN, NITRITE, LEUKOCYTESUR in the last 72 hours.  Invalid input(s): APPERANCEUR    Imaging: No results found.   Medications:    . amiodarone  400 mg Oral Daily  . midodrine  2.5 mg Oral TID WC  . sodium chloride flush  3 mL Intravenous Q12H   acetaminophen, [DISCONTINUED] ondansetron **OR** ondansetron (ZOFRAN) IV  Assessment/ Plan:   Mr. Ruben Colin Sr. is a 83 y.o. white male with systolic congestive heart failure EF of 10-15%, hypertension, atrial fibrillation, BPH, cerebrovascular disease, who was admitted to [facility] on 03/09/2017   1. Acute renal failure on chronic kidney disease stage IV with  proteinuria: nonoliguric.  Baseline creatinine of 2.2, GFR of 28 on 10/12 Acute renal failure from ATN, prerenal azotemia, overdiuresis and/or acute cardiorenal syndrome.  Would not recommend IV fluids due to cardiac function.  Will monitor and encourage patient with PO intake No acute indication for dialysis. Patient is unsure he would want dialysis Holding furosemide, metolazone and potassium.  But renal function is worse. Recommend monitoring renal function over the weekend  2. Hypotension: with systolic congestive heart failure EF 10-15% and atrial fibrillation. Hypotensive. Holding all diuretics and continue to monitor.  Discontinued carvedilol and tamsulosin - Appreciate cards input  3. Obstructive uropathy: foley catheter placed. Previously was following with Dr. Pilar Jarvis, [place].  Renal ultrasound reviewed.  - Holding tamsulosin - Will need outpatient follow up with urology.     LOS: [place], Ruben Hudson 10/20/20189:27 AM

## 2017-03-15 NOTE — Progress Notes (Signed)
[place] [facility] [place] NAME: Ruben Hudson    MR#:  [id number]  DATE OF BIRTH:  Sep 24, 1925  SUBJECTIVE:    No complaint. But he has SOB on O2 [place] 2L. SAT 92%. REVIEW OF SYSTEMS:    Review of Systems  Constitutional: Negative for fever, chills weight loss HENT: Negative for ear pain, nosebleeds, congestion, facial swelling, rhinorrhea, neck pain, neck stiffness and ear discharge.   Respiratory: Negative for cough, shortness of breath, wheezing  Cardiovascular: Negative for chest pain, palpitations and leg swelling.  Gastrointestinal: Negative for heartburn, abdominal pain, vomiting, diarrhea or consitpation Genitourinary: Negative for dysuria, urgency, frequency, hematuria Musculoskeletal: Negative for back pain or joint pain Neurological: Negative for dizziness, seizures, syncope, focal weakness,  numbness and headaches.  Hematological: Does not bruise/bleed easily.  Psychiatric/Behavioral: Negative for hallucinations, confusion, dysphoric mood   Tolerating Diet: soft diet   DRUG ALLERGIES:  No Known Allergies  VITALS:  Blood pressure 114/65, pulse 93, temperature (!) 97.4 F (36.3 C), temperature source Oral, resp. rate 15, height 5\' 9"  (1.753 m), weight 156 lb (70.8 kg), SpO2 92 %.  PHYSICAL EXAMINATION:  Constitutional: Appears well-developed and well-nourished. No distress. HENT: Normocephalic. Marland Kitchen Oropharynx is clear and moist.  Eyes: Conjunctivae and EOM are normal. PERRLA, no scleral icterus.  Neck: Normal ROM. Neck supple. No JVD. No tracheal deviation. CVS: IRR, IRR, S1/S2 +,2/6 SEM no gallops, no carotid bruit.  Pulmonary: Effort and breath sounds normal, no stridor, rhonchi, wheezes, but has mild basilar rales.  Abdominal: Soft., + vental hernia palpable good bowel sounds no distension, tenderness, rebound or guarding.  Musculoskeletal: Normal range of motion. No edema and no tenderness.  Neuro: Alert. CN 2-12 grossly intact.  No focal deficits. Generalized weakness. Skin: Skin is warm and dry. No rash noted. Psychiatric: Normal mood and affect.    LABORATORY PANEL:   CBC  Recent Labs Lab 03/13/17 0447  WBC 5.1  HGB 12.6*  HCT 38.0*  PLT 129*   ------------------------------------------------------------------------------------------------------------------  Chemistries   Recent Labs Lab 03/09/17 1634  03/15/17 0444  NA 141  < > 134*  K 4.6  < > 4.0  CL 100*  < > 99*  CO2 26  < > 24  GLUCOSE 141*  < > 104*  BUN 56*  < > 95*  CREATININE 2.59*  < > 5.88*  CALCIUM 9.9  < > 8.6*  MG 2.6*  --   --   AST 38  --   --   ALT 23  --   --   ALKPHOS 105  --   --   BILITOT 2.7*  --   --   < > = values in this interval not displayed. ------------------------------------------------------------------------------------------------------------------  Cardiac Enzymes  Recent Labs Lab 03/09/17 1634  TROPONINI 0.05*   ------------------------------------------------------------------------------------------------------------------  RADIOLOGY:  No results found.   ASSESSMENT AND PLAN:   83 y/o make with PAF, CKD stage 3 and HTN presents with abdominal pain and found to have small bowel obstruction with [name] hernia.  1. Small bowel obstruction with resolved incarceration from umbilical hernia Surgery consultation appreciated Abdomen is soft and obstruction has resolved Management as per surgery Does not need to undergo surgery Upgrade the diet. Tolerating soft diet.  2. Rapid atrial fibrillation:  Heart rate has improved on amiodarone and Coreg  transitioned to oral amiodarone  Resumed eliquis , but stopped due to worsening renal func.  3. Acute on chronic systolic heart failure with  ejection fraction less than 10-15% by last echo in March Watch for fluid overload Monitor intake and output with daily weight Cardiology consultation Appreciated. Patient appears to be euvolemic and  therefore no need for continuing IV Lasix.  Chronic respiratory failure on home oxygen 2 L. NEB prn.  4. BPH: discontinued Flomax due to hypotension.  5. Elevated troponin with baseline chronic elevation troponin   Patient has ruled out for ACS.  6.acute on chronic kidney disease stage III: Creatinine has increased in the setting of above issues Nephrology consultation appreciated. BMP for a.m. Hold nephrotoxic medications Pt have hypotension, encourage oral intake, stopped tamsulosin also.   Added midodrine.  pt and his wife chose to have HD , if needed. Follow-up BMP, no indication for hemodialysis time per Dr. Juleen China.  The patient declined Physical therapy. Palliative care consult: full code. Outpatient palliative care follow-up.  I discussed with Dr. Juleen China. Management plans discussed with the patient and wife and they are in agreement.  CODE STATUS: FULL CODE  TOTAL TIME TAKING CARE OF THIS PATIENT: 42 minutes.    POSSIBLE D/C 2-3 days, DEPENDING ON CLINICAL CONDITION.   Ruben Hudson M.D on 03/15/2017 at 6:24 PM  Between 7am to 6pm - Pager - [phone] After 6pm go to [email or web address] - password [email or web address] [facility]  Office  [phone]  CC: Primary care physician; Isaias Cowman, MD  Note: This dictation was prepared with Dragon dictation along with smaller phrase technology. Any transcriptional errors that result from this process are unintentional.

## 2017-03-16 ENCOUNTER — Encounter: Payer: Self-pay | Admitting: *Deleted

## 2017-03-16 DIAGNOSIS — N183 Chronic kidney disease, stage 3 (moderate): Secondary | ICD-10-CM

## 2017-03-16 LAB — BASIC METABOLIC PANEL
ANION GAP: 13 (ref 5–15)
BUN: 101 mg/dL — ABNORMAL HIGH (ref 6–20)
CO2: 23 mmol/L (ref 22–32)
Calcium: 8.8 mg/dL — ABNORMAL LOW (ref 8.9–10.3)
Chloride: 98 mmol/L — ABNORMAL LOW (ref 101–111)
Creatinine, Ser: 6.18 mg/dL — ABNORMAL HIGH (ref 0.61–1.24)
GFR, EST AFRICAN AMERICAN: 8 mL/min — AB (ref >60)
GFR, EST NON AFRICAN AMERICAN: 7 mL/min — AB (ref >60)
Glucose, Bld: 101 mg/dL — ABNORMAL HIGH (ref 65–99)
POTASSIUM: 4 mmol/L (ref 3.5–5.1)
SODIUM: 134 mmol/L — AB (ref 135–145)

## 2017-03-16 MED ORDER — DOCUSATE SODIUM 100 MG PO CAPS
100.0000 mg | ORAL_CAPSULE | Freq: Two times a day (BID) | ORAL | Status: DC
  Administered 2017-03-16 – 2017-03-19 (×4): 100 mg via ORAL
  Filled 2017-03-16 (×6): qty 1

## 2017-03-16 NOTE — Progress Notes (Signed)
[facility]  ROUNDING NOTE   Subjective:   Wife and son at bedside.   Creatinine 6.18 (5.88)  UOP 500  Objective:  Vital signs in last 24 hours:  Temp:  [97.3 F (36.3 C)-97.7 F (36.5 C)] 97.5 F (36.4 C) (10/21 0900) Pulse Rate:  [53-93] 81 (10/21 0900) Resp:  [18] 18 (10/21 0900) BP: (99-115)/(59-65) 115/59 (10/21 0900) SpO2:  [92 %-100 %] 96 % (10/21 0900) Weight:  [70.9 kg (156 lb 3.2 oz)] 70.9 kg (156 lb 3.2 oz) (10/21 0441)  Weight change: 0.091 kg (3.2 oz) Filed Weights   03/14/17 0519 03/15/17 0442 03/16/17 0441  Weight: 70.8 kg (156 lb 1.6 oz) 70.8 kg (156 lb) 70.9 kg (156 lb 3.2 oz)    Intake/Output: I/O last 3 completed shifts: In: 480 [P.O.:480] Out: 1150 [Urine:1150]   Intake/Output this shift:  Total I/O In: 240 [P.O.:240] Out: -   Physical Exam: General: NAD, sitting chair  Head: Normocephalic, atraumatic. Moist oral mucosal membranes  Eyes: Anicteric, PERRL  Neck: Supple, trachea midline  Lungs:  Clear, 2.5 L [place]  Heart: irregular  Abdomen:  Soft, nontender  Extremities: No peripheral edema.  Neurologic: Nonfocal, moving all four extremities  Skin: No lesions  GU: Foley with yellow urine    Basic Metabolic Panel:  Recent Labs Lab 03/09/17 1634  03/12/17 0503 03/13/17 0447 03/14/17 0653 03/15/17 0444 03/16/17 0425  NA 141  < > 137 135 137 134* 134*  K 4.6  < > 4.1 3.9 4.0 4.0 4.0  CL 100*  < > 99* 98* 100* 99* 98*  CO2 26  < > 26 23 25 24 23   GLUCOSE 141*  < > 106* 101* 102* 104* 101*  BUN 56*  < > 98* 93* 97* 95* 101*  CREATININE 2.59*  < > 4.70* 5.08* 5.67* 5.88* 6.18*  CALCIUM 9.9  < > 8.8* 8.7* 9.0 8.6* 8.8*  MG 2.6*  --   --   --   --   --   --   PHOS 6.0*  --   --   --  6.6* 6.1*  --   < > = values in this interval not displayed.  Liver Function Tests:  Recent Labs Lab 03/09/17 1634 03/14/17 0653 03/15/17 0444  AST 38  --   --   ALT 23  --   --   ALKPHOS 105  --   --   BILITOT 2.7*  --   --   PROT  8.3*  --   --   ALBUMIN 4.1 3.5 3.4*    Recent Labs Lab 03/09/17 1634  LIPASE 37   No results for input(s): AMMONIA in the last 168 hours.  CBC:  Recent Labs Lab 03/09/17 1634 03/10/17 0521 03/11/17 0340 03/12/17 0503 03/13/17 0447  WBC 6.4 5.3 5.8 6.1 5.1  HGB 13.8 12.0* 12.8* 13.1 12.6*  HCT 41.9 35.9* 38.2* 38.9* 38.0*  MCV 96.5 95.8 95.9 95.4 94.6  PLT 140* 109* 104* 118* 129*    Cardiac Enzymes:  Recent Labs Lab 03/09/17 1634  TROPONINI 0.05*    BNP: Invalid input(s): POCBNP  CBG:  Recent Labs Lab 03/09/17 2154  GLUCAP 146*    Microbiology: Results for orders placed or performed during the hospital encounter of 03/09/17  MRSA PCR Screening     Status: None   Collection Time: 03/09/17 10:28 PM  Result Value Ref Range Status   MRSA by PCR NEGATIVE NEGATIVE Final    Comment:  The GeneXpert MRSA Assay (FDA approved for NASAL specimens only), is one component of a comprehensive MRSA colonization surveillance program. It is not intended to diagnose MRSA infection nor to guide or monitor treatment for MRSA infections.     Coagulation Studies: No results for input(s): LABPROT, INR in the last 72 hours.  Urinalysis: No results for input(s): COLORURINE, LABSPEC, PHURINE, GLUCOSEU, HGBUR, BILIRUBINUR, KETONESUR, PROTEINUR, UROBILINOGEN, NITRITE, LEUKOCYTESUR in the last 72 hours.  Invalid input(s): APPERANCEUR    Imaging: No results found.   Medications:    . amiodarone  400 mg Oral Daily  . midodrine  2.5 mg Oral TID WC  . sodium chloride flush  3 mL Intravenous Q12H   acetaminophen, [DISCONTINUED] ondansetron **OR** ondansetron (ZOFRAN) IV  Assessment/ Plan:   Mr. Ruben Detwiler Sr. is a 83 y.o. white male with systolic congestive heart failure EF of 10-15%, hypertension, atrial fibrillation, BPH, cerebrovascular disease, who was admitted to [facility] on 03/09/2017   1. Acute renal failure on chronic kidney disease stage IV with  proteinuria: nonoliguric.  Baseline creatinine of 2.2, GFR of 28 on 10/12 Acute renal failure from ATN, prerenal azotemia, overdiuresis and/or acute cardiorenal syndrome.  Would not recommend IV fluids due to cardiac function.  Holding furosemide, metolazone and potassium.  - Renal function is worse. Patient's symptoms are vague. Will plan on hemodialysis for tomorrow due to uremic symptoms and renal failure. - Consult vascular surgery  2. Hypotension: with systolic congestive heart failure EF 10-15% and atrial fibrillation. Hypotensive. Holding all diuretics and continue to monitor.  Discontinued carvedilol and tamsulosin - Appreciate cards input  3. Obstructive uropathy: foley catheter placed. Previously was following with Dr. Pilar Hudson, [place].  Renal ultrasound reviewed.  - Holding tamsulosin - Will need outpatient follow up with urology.     LOS: 7 Ruben Hudson 10/21/201810:59 AM

## 2017-03-16 NOTE — Progress Notes (Signed)
Pt requesting stool softener, having trouble going to the bathroom, straining, Dr. Jannifer Franklin to put in orders.

## 2017-03-16 NOTE — Consult Note (Signed)
VASCULAR SURGERY  Reason for Consult:Acute on Chronic Kidney disease IV, requiring HD access Referring Physician: Dr. Waldo Laine Sr. is an 83 y.o. male.  HPI: Acute renal failure on chronic kidney disease stage IV with proteinuria: nonoliguric.  Baseline creatinine of 2.2, GFR of 28 now requiring HD access-permacath. Admitted for SBO and reduction of incarcerated umbilical hernia. History of AFIB on Xarelto, EF 10%.  Past Medical History:  Diagnosis Date  . Acute pulmonary edema ([place]) 02/26/2013  . Benign prostatic hyperplasia with urinary obstruction 02/24/2012  . BP (high blood pressure) 10/31/2015  . Cataract extraction status of left eye   . Cataract extraction status of right eye   . Cerebrovascular accident (CVA) ([place]) 04/24/2012  . Chronic kidney disease 02/16/2014   stage 2  . Congestive heart failure ([place]) 10/31/2015  . HLD (hyperlipidemia) 10/31/2015  . Hydrocele 02/25/2012  . Unstable angina pectoris ([place]) 02/26/2013  . Urge incontinence of urine 05/30/2014  . Urinary urgency 10/31/2015    Past Surgical History:  Procedure Laterality Date  . ANKLE FRACTURE SURGERY Right   . CIRCUMCISION N/A 11/13/2015   Procedure: CIRCUMCISION ADULT;  Surgeon: Hollice Espy, MD;  Location: [facility] ORS;  Service: Urology;  Laterality: N/A;  . EYE SURGERY    . HERNIA REPAIR    . HYDROCELE EXCISION Bilateral 11/13/2015   Procedure: HYDROCELECTOMY ADULT;  Surgeon: Hollice Espy, MD;  Location: [facility] ORS;  Service: Urology;  Laterality: Bilateral;  . TONSILLECTOMY  1950's  . TONSILLECTOMY    . UMBILICAL HERNIA REPAIR    . VARICOCELECTOMY  1948  . VASECTOMY      Family History  Problem Relation Age of Onset  . Kidney cancer Neg Hx   . Prostate cancer Neg Hx     Social History:  reports that he quit smoking about 62 years ago. He has never used smokeless tobacco. He reports that he drinks about 4.8 oz of alcohol per week . He reports that he does not  use drugs.  Allergies: No Known Allergies  Medications: I have reviewed the patient's current medications.  Results for orders placed or performed during the hospital encounter of 03/09/17 (from the past 48 hour(s))  Renal function panel     Status: Abnormal   Collection Time: 03/15/17  4:44 AM  Result Value Ref Range   Sodium 134 (L) 135 - 145 mmol/L   Potassium 4.0 3.5 - 5.1 mmol/L   Chloride 99 (L) 101 - 111 mmol/L   CO2 24 22 - 32 mmol/L   Glucose, Bld 104 (H) 65 - 99 mg/dL   BUN 95 (H) 6 - 20 mg/dL    Comment: RESULT CONFIRMED BY MANUAL DILUTION.PMH   Creatinine, Ser 5.88 (H) 0.61 - 1.24 mg/dL   Calcium 8.6 (L) 8.9 - 10.3 mg/dL   Phosphorus 6.1 (H) 2.5 - 4.6 mg/dL   Albumin 3.4 (L) 3.5 - 5.0 g/dL   GFR calc non Af Amer 8 (L) >60 mL/min   GFR calc Af Amer 9 (L) >60 mL/min    Comment: (NOTE) The eGFR has been calculated using the CKD EPI equation. This calculation has not been validated in all clinical situations. eGFR's persistently <60 mL/min signify possible Chronic Kidney Disease.    Anion gap 11 5 - 15  Basic metabolic panel     Status: Abnormal   Collection Time: 03/16/17  4:25 AM  Result Value Ref Range   Sodium 134 (L) 135 - 145 mmol/L   Potassium 4.0 3.5 -  5.1 mmol/L   Chloride 98 (L) 101 - 111 mmol/L   CO2 23 22 - 32 mmol/L   Glucose, Bld 101 (H) 65 - 99 mg/dL   BUN 101 (H) 6 - 20 mg/dL    Comment: RESULT CONFIRMED BY MANUAL DILUTION.PMH   Creatinine, Ser 6.18 (H) 0.61 - 1.24 mg/dL   Calcium 8.8 (L) 8.9 - 10.3 mg/dL   GFR calc non Af Amer 7 (L) >60 mL/min   GFR calc Af Amer 8 (L) >60 mL/min    Comment: (NOTE) The eGFR has been calculated using the CKD EPI equation. This calculation has not been validated in all clinical situations. eGFR's persistently <60 mL/min signify possible Chronic Kidney Disease.    Anion gap 13 5 - 15    No results found.  Review of Systems  Constitutional: Positive for malaise/fatigue. Negative for fever.  HENT: Negative.    Eyes: Negative.   Respiratory: Positive for shortness of breath. Negative for cough.   Cardiovascular: Positive for leg swelling. Negative for chest pain.  Gastrointestinal: Negative for nausea.  Musculoskeletal: Negative.   Skin: Negative.   Neurological: Negative for dizziness.  Endo/Heme/Allergies: Negative.    Blood pressure (!) 115/59, pulse 81, temperature (!) 97.5 F (36.4 C), temperature source Oral, resp. rate 18, height '5\' 9"'  (1.753 m), weight 70.9 kg (156 lb 3.2 oz), SpO2 96 %. Physical Exam  Nursing note and vitals reviewed. Constitutional: He is oriented to person, place, and time. He appears well-developed. No distress.  Neck: Normal range of motion. Neck supple. No JVD present. No tracheal deviation present.  Cardiovascular:  No murmur heard. Irregular regular  Respiratory: Effort normal. He has rales.  GI: Soft. There is no tenderness. There is no rebound.  Musculoskeletal: He exhibits no edema.  Neurological: He is alert and oriented to person, place, and time.  Skin: Skin is warm.    Assessment/Plan: Acute on Chronic Kidney Disease IV Requires HD- permacath placement. Discussed extensively with patient and wife- agree with proceeding with Permacath for HD.   Cathi Hazan A 03/16/2017, 1:11 PM

## 2017-03-16 NOTE — Progress Notes (Signed)
[place] at [place] NAME: Ruben Hudson    MR#:  [id number]  DATE OF BIRTH:  20-Oct-1925  SUBJECTIVE:    SOB and generalized weakness, on O2 [place] 2L. REVIEW OF SYSTEMS:    Review of Systems  Constitutional: Negative for fever, chills weight loss HENT: Negative for ear pain, nosebleeds, congestion, facial swelling, rhinorrhea, neck pain, neck stiffness and ear discharge.   Respiratory: Negative for cough, but has shortness of breath, no wheezing  Cardiovascular: Negative for chest pain, palpitations and leg swelling.  Gastrointestinal: Negative for heartburn, abdominal pain, vomiting, diarrhea or consitpation Genitourinary: Negative for dysuria, urgency, frequency, hematuria Musculoskeletal: Negative for back pain or joint pain Neurological: Negative for dizziness, seizures, syncope, focal weakness,  numbness and headaches.  Hematological: Does not bruise/bleed easily.  Psychiatric/Behavioral: Negative for hallucinations, confusion, dysphoric mood   Tolerating Diet: soft diet   DRUG ALLERGIES:  No Known Allergies  VITALS:  Blood pressure (!) 115/59, pulse 81, temperature (!) 97.5 F (36.4 C), temperature source Oral, resp. rate 18, height 5\' 9"  (1.753 m), weight 156 lb 3.2 oz (70.9 kg), SpO2 96 %.  PHYSICAL EXAMINATION:  Constitutional: Appears well-developed and well-nourished. No distress. HENT: Normocephalic. Marland Kitchen Oropharynx is clear and moist.  Eyes: Conjunctivae and EOM are normal. PERRLA, no scleral icterus.  Neck: Normal ROM. Neck supple. No JVD. No tracheal deviation. CVS: IRR, IRR, S1/S2 +,2/6 SEM no gallops, no carotid bruit.  Pulmonary: Effort and breath sounds normal, no stridor, rhonchi, wheezes, but has mild basilar rales.  Abdominal: Soft., + vental hernia palpable good bowel sounds no distension, tenderness, rebound or guarding.  Musculoskeletal: Normal range of motion. No edema and no tenderness.  Neuro: Alert. CN 2-12  grossly intact. No focal deficits. Generalized weakness. Skin: Skin is warm and dry. No rash noted. Psychiatric: Normal mood and affect.    LABORATORY PANEL:   CBC  Recent Labs Lab 03/13/17 0447  WBC 5.1  HGB 12.6*  HCT 38.0*  PLT 129*   ------------------------------------------------------------------------------------------------------------------  Chemistries   Recent Labs Lab 03/09/17 1634  03/16/17 0425  NA 141  < > 134*  K 4.6  < > 4.0  CL 100*  < > 98*  CO2 26  < > 23  GLUCOSE 141*  < > 101*  BUN 56*  < > 101*  CREATININE 2.59*  < > 6.18*  CALCIUM 9.9  < > 8.8*  MG 2.6*  --   --   AST 38  --   --   ALT 23  --   --   ALKPHOS 105  --   --   BILITOT 2.7*  --   --   < > = values in this interval not displayed. ------------------------------------------------------------------------------------------------------------------  Cardiac Enzymes  Recent Labs Lab 03/09/17 1634  TROPONINI 0.05*   ------------------------------------------------------------------------------------------------------------------  RADIOLOGY:  No results found.   ASSESSMENT AND PLAN:   83 y/o make with PAF, CKD stage 3 and HTN presents with abdominal pain and found to have small bowel obstruction with Ruben Hudson hernia.  1. Small bowel obstruction with resolved incarceration from umbilical hernia Surgery consultation appreciated Abdomen is soft and obstruction has resolved Management as per surgery Does not need to undergo surgery He tolerated diet.  2. Rapid atrial fibrillation:  Heart rate has improved on amiodarone and Coreg  transitioned to oral amiodarone  Resumed eliquis , but stopped due to worsening renal func.  3. Acute on chronic systolic heart failure with ejection  fraction less than 10-15% by last echo in March Watch for fluid overload HD tomorrow.  Chronic respiratory failure on home oxygen 2 L. NEB prn.  4. BPH: discontinued Flomax due to  hypotension.  5. Elevated troponin with baseline chronic elevation troponin   Patient has ruled out for ACS.  6.acute on chronic kidney disease stage III: Creatinine has increased in the setting of above issues Hold nephrotoxic medications Pt have hypotension, encourage oral intake, stopped tamsulosin also.   Added midodrine. Since the renal function is worsening,  start hemodialysis tomorrow per Dr. Juleen Hudson. The patient and his wife agree.  The patient declined Physical therapy. Palliative care consult: full code. Outpatient palliative care follow-up.  I discussed with Dr. Juleen Hudson and Dr. Lorenso Hudson. Management plans discussed with the patient and wife and they are in agreement.  CODE STATUS: FULL CODE  TOTAL TIME TAKING CARE OF THIS PATIENT: 37 minutes.    POSSIBLE D/C 2-3 days, DEPENDING ON CLINICAL CONDITION.   Ruben Hudson M.D on 03/16/2017 at 4:25 PM  Between 7am to 6pm - Pager - [phone] After 6pm go to [email or web address] - password [email or web address] [facility]  Office  [phone]  CC: Primary care physician; Ruben Cowman, MD  Note: This dictation was prepared with Dragon dictation along with smaller phrase technology. Any transcriptional errors that result from this process are unintentional.

## 2017-03-17 ENCOUNTER — Inpatient Hospital Stay
Admission: EM | Disposition: A | Discharge status: Home Health | Payer: Self-pay | Source: Home / Self Care | Attending: Internal Medicine

## 2017-03-17 DIAGNOSIS — N186 End stage renal disease: Secondary | ICD-10-CM

## 2017-03-17 HISTORY — PX: DIALYSIS/PERMA CATHETER INSERTION: CATH118288

## 2017-03-17 LAB — RENAL FUNCTION PANEL
Albumin: 3.5 g/dL (ref 3.5–5.0)
Anion gap: 9 (ref 5–15)
BUN: 101 mg/dL — AB (ref 6–20)
CHLORIDE: 102 mmol/L (ref 101–111)
CO2: 23 mmol/L (ref 22–32)
CREATININE: 6.31 mg/dL — AB (ref 0.61–1.24)
Calcium: 8.9 mg/dL (ref 8.9–10.3)
GFR calc non Af Amer: 7 mL/min — ABNORMAL LOW (ref >60)
GFR, EST AFRICAN AMERICAN: 8 mL/min — AB (ref >60)
Glucose, Bld: 106 mg/dL — ABNORMAL HIGH (ref 65–99)
POTASSIUM: 4 mmol/L (ref 3.5–5.1)
Phosphorus: 5.9 mg/dL — ABNORMAL HIGH (ref 2.5–4.6)
Sodium: 134 mmol/L — ABNORMAL LOW (ref 135–145)

## 2017-03-17 SURGERY — DIALYSIS/PERMA CATHETER INSERTION
Anesthesia: Moderate Sedation

## 2017-03-17 MED ORDER — BISACODYL 5 MG PO TBEC: 5.0000 mg | DELAYED_RELEASE_TABLET | Freq: Every day | ORAL | Status: DC | PRN

## 2017-03-17 MED ORDER — MIDAZOLAM HCL 2 MG/2ML IJ SOLN
INTRAMUSCULAR | Status: DC | PRN
  Administered 2017-03-17: 1 mg via INTRAVENOUS

## 2017-03-17 MED ORDER — FENTANYL CITRATE (PF) 100 MCG/2ML IJ SOLN
INTRAMUSCULAR | Status: DC | PRN
  Administered 2017-03-17 (×2): 25 ug via INTRAVENOUS

## 2017-03-17 MED ORDER — HEPARIN SODIUM (PORCINE) 1000 UNIT/ML IJ SOLN
3000.0000 [IU] | INTRAMUSCULAR | Status: DC | PRN
  Administered 2017-03-17 – 2017-03-20 (×2): 3000 [IU]
  Filled 2017-03-17 (×3): qty 3

## 2017-03-17 MED ORDER — HEPARIN SODIUM (PORCINE) 10000 UNIT/ML IJ SOLN
INTRAMUSCULAR | Status: AC
  Filled 2017-03-17: qty 1

## 2017-03-17 MED ORDER — SODIUM CHLORIDE 0.9 % IV SOLN: INTRAVENOUS | Status: DC

## 2017-03-17 MED ORDER — HEPARIN (PORCINE) IN NACL 2-0.9 UNIT/ML-% IJ SOLN
INTRAMUSCULAR | Status: AC
  Filled 2017-03-17: qty 500

## 2017-03-17 MED ORDER — MIDAZOLAM HCL 5 MG/5ML IJ SOLN
INTRAMUSCULAR | Status: AC
  Filled 2017-03-17: qty 5

## 2017-03-17 MED ORDER — LIDOCAINE-EPINEPHRINE (PF) 1 %-1:200000 IJ SOLN
INTRAMUSCULAR | Status: AC
  Filled 2017-03-17: qty 30

## 2017-03-17 MED ORDER — TUBERCULIN PPD 5 UNIT/0.1ML ID SOLN
5.0000 [IU] | Freq: Once | INTRADERMAL | Status: AC
  Administered 2017-03-18: 5 [IU] via INTRADERMAL
  Filled 2017-03-17: qty 0.1

## 2017-03-17 MED ORDER — CEFAZOLIN SODIUM-DEXTROSE 1-4 GM/50ML-% IV SOLN
1.0000 g | Freq: Once | INTRAVENOUS | Status: AC
Start: 2017-03-17 — End: 2017-03-17
  Filled 2017-03-17: qty 50

## 2017-03-17 MED ORDER — FENTANYL CITRATE (PF) 100 MCG/2ML IJ SOLN
INTRAMUSCULAR | Status: AC
Start: 2017-03-17 — End: ?
  Filled 2017-03-17: qty 2

## 2017-03-17 SURGICAL SUPPLY — 8 items
BIOPATCH WHT 1IN DISK W/4.0 H (GAUZE/BANDAGES/DRESSINGS) ×3 IMPLANT
CATH PALINDROME RT-P 15FX19CM (CATHETERS) ×3 IMPLANT
DERMABOND ADVANCED (GAUZE/BANDAGES/DRESSINGS) ×2
DERMABOND ADVANCED .7 DNX12 (GAUZE/BANDAGES/DRESSINGS) ×1 IMPLANT
PACK ANGIOGRAPHY (CUSTOM PROCEDURE TRAY) ×3 IMPLANT
SUT MNCRL AB 4-0 PS2 18 (SUTURE) ×3 IMPLANT
SUT PROLENE 0 CT 1 30 (SUTURE) ×3 IMPLANT
TOWEL OR 17X26 4PK STRL BLUE (TOWEL DISPOSABLE) ×3 IMPLANT

## 2017-03-17 NOTE — Progress Notes (Signed)
Ruben Hudson at [place] NAME: Ruben Hudson    MR#:  [id number]  DATE OF BIRTH:  08/23/1925  SUBJECTIVE:    better SOB and generalized weakness, on O2 [place] 2L. REVIEW OF SYSTEMS:    Review of Systems  Constitutional: Negative for fever, chills weight loss HENT: Negative for ear pain, nosebleeds, congestion, facial swelling, rhinorrhea, neck pain, neck stiffness and ear discharge.   Respiratory: Negative for cough, but has shortness of breath, no wheezing  Cardiovascular: Negative for chest pain, palpitations and leg swelling.  Gastrointestinal: Negative for heartburn, abdominal pain, vomiting, diarrhea or consitpation Genitourinary: Negative for dysuria, urgency, frequency, hematuria Musculoskeletal: Negative for back pain or joint pain Neurological: Negative for dizziness, seizures, syncope, focal weakness,  numbness and headaches.  Hematological: Does not bruise/bleed easily.  Psychiatric/Behavioral: Negative for hallucinations, confusion, dysphoric mood   Tolerating Diet: soft diet   DRUG ALLERGIES:  No Known Allergies  VITALS:  Blood pressure (!) 107/59, pulse 88, temperature (!) 97.5 F (36.4 C), temperature source Oral, resp. rate 18, height 5\' 9"  (1.753 m), weight 156 lb 8.4 oz (71 kg), SpO2 100 %.  PHYSICAL EXAMINATION:  Constitutional: Appears well-developed and well-nourished. No distress. HENT: Normocephalic. Marland Kitchen Oropharynx is clear and moist.  Eyes: Conjunctivae and EOM are normal. PERRLA, no scleral icterus.  Neck: Normal ROM. Neck supple. No JVD. No tracheal deviation. CVS: IRR, IRR, S1/S2 +,2/6 SEM no gallops, no carotid bruit.  Pulmonary: Effort and breath sounds normal, no stridor, rhonchi, wheezes, but has mild basilar rales.  Abdominal: Soft., + vental hernia palpable good bowel sounds no distension, tenderness, rebound or guarding.  Musculoskeletal: Normal range of motion. No edema and no tenderness.  Neuro: Alert. CN  2-12 grossly intact. No focal deficits. Generalized weakness. Skin: Skin is warm and dry. No rash noted. Psychiatric: Normal mood and affect.    LABORATORY PANEL:   CBC  Recent Labs Lab 03/13/17 0447  WBC 5.1  HGB 12.6*  HCT 38.0*  PLT 129*   ------------------------------------------------------------------------------------------------------------------  Chemistries   Recent Labs Lab 03/17/17 0712  NA 134*  K 4.0  CL 102  CO2 23  GLUCOSE 106*  BUN 101*  CREATININE 6.31*  CALCIUM 8.9   ------------------------------------------------------------------------------------------------------------------  Cardiac Enzymes No results for input(s): TROPONINI in the last 168 hours. ------------------------------------------------------------------------------------------------------------------  RADIOLOGY:  No results found.   ASSESSMENT AND PLAN:   83 y/o make with PAF, CKD stage 3 and HTN presents with abdominal pain and found to have small bowel obstruction with Ruben Hudson hernia.  1. Small bowel obstruction with resolved incarceration from umbilical hernia Surgery consultation appreciated Abdomen is soft and obstruction has resolved Does not need to undergo surgery He tolerated diet.  2. Rapid atrial fibrillation:  Heart rate has improved on amiodarone and Coreg  transitioned to oral amiodarone  Resumed eliquis , but stopped due to worsening renal func. May need starting Coumadin after hemodialysis.  3. Acute on chronic systolic heart failure with ejection fraction less than 10-15% by last echo in March Watch for fluid overload HD today.  Chronic respiratory failure on home oxygen 2 L. NEB prn.  4. BPH: discontinued Flomax due to hypotension.  5. Elevated troponin with baseline chronic elevation troponin   Patient has ruled out for ACS.  6. Acute renal failure on chronic kidney disease stage IV with proteinuria: nonoliguric. Pt had hypotension,  encourage oral intake, stopped tamsulosin also.   Added midodrine. PermCath placement today Acute renal failure dialysis  for now per Dr. Candiss Hudson.  PT evaluation suggested PT home health. Palliative care consult: full code. Outpatient palliative care follow-up.  Management plans discussed with the patient and wife and they are in agreement.  CODE STATUS: FULL CODE  TOTAL TIME TAKING CARE OF THIS PATIENT: 33 minutes.    POSSIBLE D/C 2-3 days, DEPENDING ON CLINICAL CONDITION.   Ruben Hudson M.D on 03/17/2017 at 4:07 PM  Between 7am to 6pm - Pager - [phone] After 6pm go to [email or web address] - password [email or web address] [facility]  Office  [phone]  CC: Primary care physician; Ruben Cowman, MD  Note: This dictation was prepared with Dragon dictation along with smaller phrase technology. Any transcriptional errors that result from this process are unintentional.

## 2017-03-17 NOTE — Op Note (Signed)
OPERATIVE NOTE    PRE-OPERATIVE DIAGNOSIS: 1. ESRD  POST-OPERATIVE DIAGNOSIS: same as above  PROCEDURE: 1. Ultrasound guidance for vascular access to the right internal jugular vein 2. Fluoroscopic guidance for placement of catheter 3. Placement of a 19 cm tip to cuff tunneled hemodialysis catheter via the right internal jugular vein  SURGEON: Leotis Pain, MD  ANESTHESIA:  Local with Moderate conscious sedation for approximately 20 minutes using 1 mg of Versed and 50 mcg of Fentanyl  ESTIMATED BLOOD LOSS: 35 cc  FLUORO TIME: less than one minute  CONTRAST: none  FINDING(S): 1.  Patent right internal jugular vein  SPECIMEN(S):  None  INDICATIONS:   Ruben Dungan Sr. is a 83 y.o.male who presents with renal failure.  The patient needs long term dialysis access for their ESRD, and a Permcath is necessary.  Risks and benefits are discussed and informed consent is obtained.    DESCRIPTION: After obtaining full informed written consent, the patient was brought back to the vascular suited. The patient's right neck and chest were sterilely prepped and draped in a sterile surgical field was created. Moderate conscious sedation was administered during a face to face encounter with the patient throughout the procedure with my supervision of the RN administering medicines and monitoring the patient's vital signs, pulse oximetry, telemetry and mental status throughout from the start of the procedure until the patient was taken to the recovery room.  The right internal jugular vein was visualized with ultrasound and found to be patent. It was then accessed under direct ultrasound guidance and a permanent image was recorded. A wire was placed. After skin nick and dilatation, the peel-away sheath was placed over the wire. I then turned my attention to an area under the clavicle. Approximately 1-2 fingerbreadths below the clavicle a small counterincision was created and tunneled from the subclavicular  incision to the access site. Using fluoroscopic guidance, a 19 centimeter tip to cuff tunneled hemodialysis catheter was selected, and tunneled from the subclavicular incision to the access site. It was then placed through the peel-away sheath and the peel-away sheath was removed. Using fluoroscopic guidance the catheter tips were parked in the right atrium. The appropriate distal connectors were placed. It withdrew blood well and flushed easily with heparinized saline and a concentrated heparin solution was then placed. It was secured to the chest wall with 2 Prolene sutures. The access incision was closed single 4-0 Monocryl. A 4-0 Monocryl pursestring suture was placed around the exit site. Sterile dressings were placed. The patient tolerated the procedure well and was taken to the recovery room in stable condition.  COMPLICATIONS: None  CONDITION: Stable  Leotis Pain, MD 03/17/2017 2:30 PM   This note was created with Dragon Medical transcription system. Any errors in dictation are purely unintentional.

## 2017-03-17 NOTE — Progress Notes (Signed)
[facility]  ROUNDING NOTE   Subjective:   Wife and son at bedside.  Multiple family members at bedside Patient is awaiting PermCath placement Serum creatinine further worsened to 6.31, BUN 101 Patient denies any shortness of breath Family states that he ate dinner last night without nausea or vomiting  Objective:  Vital signs in last 24 hours:  Temp:  [97.8 F (36.6 C)-97.9 F (36.6 C)] 97.9 F (36.6 C) (10/22 0416) Pulse Rate:  [49-102] 81 (10/22 0955) Resp:  [16-18] 16 (10/22 0416) BP: (98-112)/(56-67) 107/58 (10/22 0955) SpO2:  [98 %-100 %] 100 % (10/22 0955) Weight:  [71 kg (156 lb 8.4 oz)] 71 kg (156 lb 8.4 oz) (10/22 0416)  Weight change: 0.148 kg (5.2 oz) Filed Weights   03/15/17 0442 03/16/17 0441 03/17/17 0416  Weight: 70.8 kg (156 lb) 70.9 kg (156 lb 3.2 oz) 71 kg (156 lb 8.4 oz)    Intake/Output: I/O last 3 completed shifts: In: 243 [P.O.:240; I.V.:3] Out: 1750 [Urine:1750]   Intake/Output this shift:  No intake/output data recorded.  Physical Exam: General: NAD, sitting chair  Head: Normocephalic, atraumatic. Moist oral mucosal membranes  Eyes: Anicteric,   Neck: Supple,  Lungs:  Clear,   Heart: irregular  Abdomen:  Soft, nontender  Extremities: No peripheral edema.  Neurologic: Nonfocal, moving all four extremities, able to follow commands  Skin: No lesions  GU: Foley with yellow urine    Basic Metabolic Panel:  Recent Labs Lab 03/13/17 0447 03/14/17 0653 03/15/17 0444 03/16/17 0425 03/17/17 0712  NA 135 137 134* 134* 134*  K 3.9 4.0 4.0 4.0 4.0  CL 98* 100* 99* 98* 102  CO2 23 25 24 23 23   GLUCOSE 101* 102* 104* 101* 106*  BUN 93* 97* 95* 101* 101*  CREATININE 5.08* 5.67* 5.88* 6.18* 6.31*  CALCIUM 8.7* 9.0 8.6* 8.8* 8.9  PHOS  --  6.6* 6.1*  --  5.9*    Liver Function Tests:  Recent Labs Lab 03/14/17 0653 03/15/17 0444 03/17/17 0712  ALBUMIN 3.5 3.4* 3.5   No results for input(s): LIPASE, AMYLASE in the  last 168 hours. No results for input(s): AMMONIA in the last 168 hours.  CBC:  Recent Labs Lab 03/11/17 0340 03/12/17 0503 03/13/17 0447  WBC 5.8 6.1 5.1  HGB 12.8* 13.1 12.6*  HCT 38.2* 38.9* 38.0*  MCV 95.9 95.4 94.6  PLT 104* 118* 129*    Cardiac Enzymes: No results for input(s): CKTOTAL, CKMB, CKMBINDEX, TROPONINI in the last 168 hours.  BNP: Invalid input(s): POCBNP  CBG: No results for input(s): GLUCAP in the last 168 hours.  Microbiology: Results for orders placed or performed during the hospital encounter of 03/09/17  MRSA PCR Screening     Status: None   Collection Time: 03/09/17 10:28 PM  Result Value Ref Range Status   MRSA by PCR NEGATIVE NEGATIVE Final    Comment:        The GeneXpert MRSA Assay (FDA approved for NASAL specimens only), is one component of a comprehensive MRSA colonization surveillance program. It is not intended to diagnose MRSA infection nor to guide or monitor treatment for MRSA infections.     Coagulation Studies: No results for input(s): LABPROT, INR in the last 72 hours.  Urinalysis: No results for input(s): COLORURINE, LABSPEC, PHURINE, GLUCOSEU, HGBUR, BILIRUBINUR, KETONESUR, PROTEINUR, UROBILINOGEN, NITRITE, LEUKOCYTESUR in the last 72 hours.  Invalid input(s): APPERANCEUR    Imaging: No results found.   Medications:   . sodium chloride     .  amiodarone  400 mg Oral Daily  . docusate sodium  100 mg Oral BID  . midodrine  2.5 mg Oral TID WC  . sodium chloride flush  3 mL Intravenous Q12H   acetaminophen, bisacodyl, [DISCONTINUED] ondansetron **OR** ondansetron (ZOFRAN) IV  Assessment/ Plan:   Mr. Ruben Gladney Sr. is a 83 y.o. white male with systolic congestive heart failure EF of 10-15%, hypertension, atrial fibrillation, BPH, cerebrovascular disease, who was admitted to [facility] on 03/09/2017   1. Acute renal failure on chronic kidney disease stage IV with proteinuria: nonoliguric.  Baseline creatinine of  2.2, GFR of 28 on 10/12 Acute renal failure from ATN, prerenal azotemia, overdiuresis and/or acute cardiorenal syndrome.   Holding furosemide, metolazone and potassium.  - Renal function is worse.  -PermCath placement today Acute renal failure dialysis for now. Short treatment of dialysis today if  PermCath is available at a reasonable time.  Will avoid after hours first treatment.  2. Hypotension: with systolic congestive heart failure EF 10-15% and atrial fibrillation.  Hypotensive. Holding all diuretics and continue to monitor.  Discontinued carvedilol and tamsulosin  3. Obstructive uropathy: foley catheter placed. Previously was following with Dr. Pilar Jarvis, [facility].  Renal ultrasound from 10/17 showed no hydronephrosis, echogenic renal parenchyma -If blood pressure improves, consider restarting tamsulosin Patient was seen by urologist in July 2018 [name] reports that he was able to void normally at home.  Consider voiding trial prior to d/c  4.  Small bowel obstruction Seen on CT on October 14.  Transition at small paraumbilical hernia where there is a herniation of a short loop of small bowel   LOS: 8 Ruben Hudson 10/22/201812:17 PM

## 2017-03-17 NOTE — Progress Notes (Signed)
Patient informed that he is scheduled for his perm cath placement at 1630. Family requesting that he have clear liquids for breakfast. Dr. Lucky Cowboy paged. OR nurse returned call and stated that Dr. Lucky Cowboy has requested patient to be NPO. Family notified and Agreed. Will continue to monitor.

## 2017-03-17 NOTE — Procedures (Signed)
Pt arrived to HDU via hospital transport staff at or about 1720 and was assessed prior to tx and found to have a RIJ, which was accessed per p&p w/o issue; order includes RTD 1.5 hours on 3251 bath, BFR 200 DFR 300 and attempt to achieve 0 kg fluid removal as tolerated by pt; tx initiated at 1735 w/o issue; pt completed 1 hour and 15 minutes of his tx and was rinsed back early r/t heavy clotting in venous chamber and pt achieved target of 0 kg fluid removal w/o complication, all blood was rinsed back and catheter care performed per p&p; pt was stable at the end of his tx and when leaving HDU under the care of transport staff; report called to primary RN

## 2017-03-17 NOTE — Progress Notes (Signed)
Physical Therapy Treatment Patient Details Name: Ruben Desai Sr. MRN: [id number] DOB: 1925-08-29 Today's Date: 03/17/2017    History of Present Illness Pt is an 83 y.o. M with a PMH including a-fib, CVA, CKD stage 2, CHF, HLD and umbilical herniation. Presented to ED on 10/14 with abdominal pain and swelling and emesis secondary to small bowel obstruction, a-fib with RVR, acute on chronic systolic CHF, acute on chronic CKD stage 3, elevated troponin and HLD. Imaging confirms herniation of small loop of bowel, small bilateral pleural effusions, mild decrease in liver attenuation which may represent steatosis or vascular congestion from heart failure, pulmonary vascular congestion. Umbilical hernia reduced on 03/09/17; per previous notes, remains reduced.     PT Comments    Pt agreeable to PT; denies pain, occasional restlessness. Pt participates in seated and long sit exercises with assist as needed. Requires min  Guard with cues for hands for transfers. Min guard for ambulation in room as well. O2 saturation between 93 and 97% on room air and HR between 56 and 65 beats per minute during session. Pt encouraged to perform exercises several times a day for strength and to prevent restlessness. Continue PT to progress strength and safety to improve function and independence with function.    Follow Up Recommendations  Home health PT;Supervision for mobility/OOB     Equipment Recommendations  Rolling walker with 5" wheels    Recommendations for Other Services       Precautions / Restrictions Precautions Precautions: Fall Restrictions Weight Bearing Restrictions: No    Mobility  Bed Mobility               General bed mobility comments: Not tested; up in bed  Transfers Overall transfer level: Needs assistance Equipment used: Rolling walker (2 wheeled) Transfers: Sit to/from Stand Sit to Stand: Min guard         General transfer comment: cues for safe hand  placement  Ambulation/Gait Ambulation/Gait assistance: Min guard Ambulation Distance (Feet): 60 Feet Assistive device: Rolling walker (2 wheeled) Gait Pattern/deviations: Step-through pattern   Gait velocity interpretation: Below normal speed for age/gender     Stairs            Wheelchair Mobility    Modified Rankin (Stroke Patients Only)       Balance           Standing balance support: Bilateral upper extremity supported Standing balance-Leahy Scale: Fair                              Cognition Arousal/Alertness: Awake/alert Behavior During Therapy: WFL for tasks assessed/performed Overall Cognitive Status: Within Functional Limits for tasks assessed                                        Exercises General Exercises - Lower Extremity Long Arc Quad: AROM;Both;20 reps;Seated Heel Slides: AROM;Both;20 reps Hip ABduction/ADduction: AROM;AAROM;Both;20 reps;Seated Straight Leg Raises: Both;10 reps;Seated;AAROM (2 sets) Hip Flexion/Marching: AROM;Both;10 reps;Seated (2 sets) Toe Raises: AROM;Both;20 reps;Seated Heel Raises: AROM;Both;20 reps;Seated    General Comments        Pertinent Vitals/Pain Pain Assessment: No/denies pain    Home Living                      Prior Function  PT Goals (current goals can now be found in the care plan section) Progress towards PT goals: Progressing toward goals    Frequency    Min 2X/week      PT Plan Current plan remains appropriate    Co-evaluation              AM-PAC PT "6 Clicks" Daily Activity  Outcome Measure  Difficulty turning over in bed (including adjusting bedclothes, sheets and blankets)?: A Little Difficulty moving from lying on back to sitting on the side of the bed? : A Little Difficulty sitting down on and standing up from a chair with arms (e.g., wheelchair, bedside commode, etc,.)?: A Little Help needed moving to and from a bed to  chair (including a wheelchair)?: A Little Help needed walking in hospital room?: A Little Help needed climbing 3-5 steps with a railing? : A Lot 6 Click Score: 17    End of Session   Activity Tolerance: Patient tolerated treatment well Patient left: in chair;with family/visitor present         Time: 1100-1135 PT Time Calculation (min) (ACUTE ONLY): 35 min  Charges:  $Gait Training: 8-22 mins $Therapeutic Exercise: 8-22 mins                    G Codes:  Functional Assessment Tool Used: AM-PAC 6 Clicks Basic Mobility     Larae Grooms, PTA 03/17/2017, 11:39 AM

## 2017-03-17 NOTE — H&P (Signed)
[facility] History & Physical Update  The patient was interviewed and re-examined.  The patient's previous History and Physical has been reviewed and is unchanged.  There is no change in the plan of care. We plan to proceed with the scheduled procedure.  Leotis Pain, MD  03/17/2017, 1:43 PM

## 2017-03-18 ENCOUNTER — Encounter: Payer: Self-pay | Admitting: Vascular Surgery

## 2017-03-18 LAB — CBC
HCT: 39.5 % — ABNORMAL LOW (ref 40.0–52.0)
Hemoglobin: 13.1 g/dL (ref 13.0–18.0)
MCH: 31.2 pg (ref 26.0–34.0)
MCHC: 33.2 g/dL (ref 32.0–36.0)
MCV: 94 fL (ref 80.0–100.0)
PLATELETS: 160 10*3/uL (ref 150–440)
RBC: 4.2 MIL/uL — AB (ref 4.40–5.90)
RDW: 15.7 % — ABNORMAL HIGH (ref 11.5–14.5)
WBC: 6.5 10*3/uL (ref 3.8–10.6)

## 2017-03-18 LAB — RENAL FUNCTION PANEL
Albumin: 3.6 g/dL (ref 3.5–5.0)
Anion gap: 12 (ref 5–15)
BUN: 85 mg/dL — AB (ref 6–20)
CALCIUM: 8.9 mg/dL (ref 8.9–10.3)
CHLORIDE: 101 mmol/L (ref 101–111)
CO2: 23 mmol/L (ref 22–32)
CREATININE: 5.54 mg/dL — AB (ref 0.61–1.24)
GFR calc non Af Amer: 8 mL/min — ABNORMAL LOW (ref >60)
GFR, EST AFRICAN AMERICAN: 9 mL/min — AB (ref >60)
GLUCOSE: 121 mg/dL — AB (ref 65–99)
Phosphorus: 5.3 mg/dL — ABNORMAL HIGH (ref 2.5–4.6)
Potassium: 4.4 mmol/L (ref 3.5–5.1)
SODIUM: 136 mmol/L (ref 135–145)

## 2017-03-18 MED ORDER — NEPRO/CARBSTEADY PO LIQD
237.0000 mL | Freq: Two times a day (BID) | ORAL | Status: DC
  Administered 2017-03-18: 237 mL via ORAL

## 2017-03-18 MED ORDER — WARFARIN SODIUM 5 MG PO TABS
5.0000 mg | ORAL_TABLET | Freq: Once | ORAL | Status: AC
  Administered 2017-03-18: 5 mg via ORAL
  Filled 2017-03-18: qty 1

## 2017-03-18 MED ORDER — WARFARIN - PHARMACIST DOSING INPATIENT: Freq: Every day | Status: DC

## 2017-03-18 MED ORDER — HEPARIN SODIUM (PORCINE) 1000 UNIT/ML DIALYSIS: 50.0000 [IU]/kg | INTRAMUSCULAR | Status: DC | PRN

## 2017-03-18 NOTE — Progress Notes (Signed)
Pre hd assessment  

## 2017-03-18 NOTE — Progress Notes (Signed)
Post HD assessment unchanged  

## 2017-03-18 NOTE — Progress Notes (Addendum)
[facility] [place] NAME: Ruben Hudson    MR#:  [id number]  DATE OF BIRTH:  01-22-1926  SUBJECTIVE:    better SOB and generalized weakness, on O2 [place] 2L. S/p HD yesterday and today. REVIEW OF SYSTEMS:    Review of Systems  Constitutional: Negative for fever, chills weight loss HENT: Negative for ear pain, nosebleeds, congestion, facial swelling, rhinorrhea, neck pain, neck stiffness and ear discharge.   Respiratory: Negative for cough, but has shortness of breath, no wheezing  Cardiovascular: Negative for chest pain, palpitations and leg swelling.  Gastrointestinal: Negative for heartburn, abdominal pain, vomiting, diarrhea or consitpation Genitourinary: Negative for dysuria, urgency, frequency, hematuria Musculoskeletal: Negative for back pain or joint pain Neurological: Negative for dizziness, seizures, syncope, focal weakness,  numbness and headaches.  Hematological: Does not bruise/bleed easily.  Psychiatric/Behavioral: Negative for hallucinations, confusion, dysphoric mood  DRUG ALLERGIES:  No Known Allergies  VITALS:  Blood pressure 121/90, pulse (!) 30, temperature 97.6 F (36.4 C), temperature source Oral, resp. rate (!) 27, height 5\' 9"  (1.753 m), weight 159 lb 9.8 oz (72.4 kg), SpO2 96 %.  PHYSICAL EXAMINATION:  Constitutional: Severe malnutrition. In no distress. HENT: Normocephalic. Marland Kitchen Oropharynx is clear and moist.  Eyes: Conjunctivae and EOM are normal. PERRLA, no scleral icterus.  Neck: Normal ROM. Neck supple. No JVD. No tracheal deviation. CVS: IRR, IRR, S1/S2 +,2/6 SEM no gallops, no carotid bruit.  Pulmonary: Effort and breath sounds normal, no stridor, rhonchi, wheezes, but has mild basilar rales.  Abdominal: Soft., + vental hernia palpable good bowel sounds no distension, tenderness, rebound or guarding.  Musculoskeletal: Normal range of motion. No edema and no tenderness.  Neuro: Alert. CN 2-12 grossly intact. No  focal deficits. Generalized weakness. Skin: Skin is warm and dry. No rash noted. Psychiatric: Normal mood and affect.   LABORATORY PANEL:   CBC  Recent Labs Lab 03/18/17 0427  WBC 6.5  HGB 13.1  HCT 39.5*  PLT 160   ------------------------------------------------------------------------------------------------------------------  Chemistries   Recent Labs Lab 03/18/17 0427  NA 136  K 4.4  CL 101  CO2 23  GLUCOSE 121*  BUN 85*  CREATININE 5.54*  CALCIUM 8.9   ------------------------------------------------------------------------------------------------------------------  Cardiac Enzymes No results for input(s): TROPONINI in the last 168 hours. ------------------------------------------------------------------------------------------------------------------  RADIOLOGY:  No results found.   ASSESSMENT AND PLAN:   83 y/o make with PAF, CKD stage 3 and HTN presents with abdominal pain and found to have small bowel obstruction with [name] hernia.  1. Small bowel obstruction with resolved incarceration from umbilical hernia Surgery consultation appreciated Abdomen is soft and obstruction has resolved Does not need to undergo surgery He tolerated diet.  2. Atrial fibrillation:  Heart rate has improved on amiodarone and Coreg  transitioned to oral amiodarone  Resumed eliquis , but stopped due to worsening renal func. Can start Coumadin per Dr. Candiss Norse.  3. Acute on chronic systolic heart failure with ejection fraction less than 10-15% by last echo in March Watch for fluid overload S/p HD today. Possible hemodialysis tomorrow per Dr. Candiss Norse.  Chronic respiratory failure on home oxygen 2 L. NEB prn.  4. BPH: discontinued Flomax due to hypotension.  5. Elevated troponin with baseline chronic elevation troponin   Patient has ruled out for ACS.  6. Acute renal failure on chronic kidney disease stage IV with proteinuria: nonoliguric. Pt had hypotension,  encourage oral intake, stopped tamsulosin also.   Added midodrine. S/P permCath placement. Evaluate daily  for need of further dialysis per Dr. Candiss Norse.  I discussed with Dr. Candiss Norse. PT evaluation suggested PT home health. Palliative care consult: full code. Outpatient palliative care follow-up.  Management plans discussed with the patient and wife and they are in agreement.  CODE STATUS: FULL CODE  TOTAL TIME TAKING CARE OF THIS PATIENT: 33 minutes.    POSSIBLE D/C 2-3 days, DEPENDING ON CLINICAL CONDITION.   [name] M.D on 03/18/2017 at 2:41 PM  Between 7am to 6pm - Pager - [phone] After 6pm go to [email or web address] - password [email or web address] [facility]  Office  [phone]  CC: Primary care physician; Isaias Cowman, MD  Note: This dictation was prepared with Dragon dictation along with smaller phrase technology. Any transcriptional errors that result from this process are unintentional.

## 2017-03-18 NOTE — Progress Notes (Signed)
HD completed without issue. Total UF 61mL as ordered. Patient tolerated well. Became somewhat anxious during last hour. Resolved after given warm blankets and watching tv. Report given to primary RN. Catheter capped, clamped and taped and new biopatch applied pre HD. Patient left unit with transport in no distress.

## 2017-03-18 NOTE — Progress Notes (Signed)
[facility]  ROUNDING NOTE   Subjective:   Patient underwent second dialysis treatment today successfully He reports some tingling sensation and restlessness in the legs He also reports anxiety towards the end of the treatment Did well with heparin.  System did not clot. Sitting up in the chair post dialysis.  No shortness of breath.  Objective:  Vital signs in last 24 hours:  Temp:  [97.5 F (36.4 C)-98.2 F (36.8 C)] 97.6 F (36.4 C) (10/23 1133) Pulse Rate:  [30-111] 30 (10/23 1133) Resp:  [16-31] 27 (10/23 1133) BP: (94-144)/(59-103) 121/90 (10/23 1133) SpO2:  [92 %-100 %] 96 % (10/23 1133) Weight:  [72.3 kg (159 lb 4.8 oz)-72.4 kg (159 lb 9.8 oz)] 72.4 kg (159 lb 9.8 oz) (10/23 1133)  Weight change: 1.258 kg (2 lb 12.4 oz) Filed Weights   03/18/17 0442 03/18/17 0920 03/18/17 1133  Weight: 72.3 kg (159 lb 4.8 oz) 72.3 kg (159 lb 6.3 oz) 72.4 kg (159 lb 9.8 oz)    Intake/Output: I/O last 3 completed shifts: In: 6 [I.V.:6] Out: 1250 [Urine:1250]   Intake/Output this shift:  Total I/O In: 240 [P.O.:240] Out: 301 [Urine:300; Other:1]  Physical Exam: General: NAD, sitting chair  Head: Normocephalic, atraumatic. Moist oral mucosal membranes  Eyes: Anicteric,   Neck: Supple,  Lungs:  Clear,   Heart: irregular  Abdomen:  Soft, nontender  Extremities: No peripheral edema.  Neurologic: Nonfocal, moving all four extremities, able to follow commands   Right IJ PermCath/Dr. Dew/03/17/2017  GU: Foley with yellow urine    Basic Metabolic Panel:  Recent Labs Lab 03/14/17 0653 03/15/17 0444 03/16/17 0425 03/17/17 0712 03/18/17 0427  NA 137 134* 134* 134* 136  K 4.0 4.0 4.0 4.0 4.4  CL 100* 99* 98* 102 101  CO2 25 24 23 23 23   GLUCOSE 102* 104* 101* 106* 121*  BUN 97* 95* 101* 101* 85*  CREATININE 5.67* 5.88* 6.18* 6.31* 5.54*  CALCIUM 9.0 8.6* 8.8* 8.9 8.9  PHOS 6.6* 6.1*  --  5.9* 5.3*    Liver Function Tests:  Recent Labs Lab  03/14/17 0653 03/15/17 0444 03/17/17 0712 03/18/17 0427  ALBUMIN 3.5 3.4* 3.5 3.6   No results for input(s): LIPASE, AMYLASE in the last 168 hours. No results for input(s): AMMONIA in the last 168 hours.  CBC:  Recent Labs Lab 03/12/17 0503 03/13/17 0447 03/18/17 0427  WBC 6.1 5.1 6.5  HGB 13.1 12.6* 13.1  HCT 38.9* 38.0* 39.5*  MCV 95.4 94.6 94.0  PLT 118* 129* 160    Cardiac Enzymes: No results for input(s): CKTOTAL, CKMB, CKMBINDEX, TROPONINI in the last 168 hours.  BNP: Invalid input(s): POCBNP  CBG: No results for input(s): GLUCAP in the last 168 hours.  Microbiology: Results for orders placed or performed during the hospital encounter of 03/09/17  MRSA PCR Screening     Status: None   Collection Time: 03/09/17 10:28 PM  Result Value Ref Range Status   MRSA by PCR NEGATIVE NEGATIVE Final    Comment:        The GeneXpert MRSA Assay (FDA approved for NASAL specimens only), is one component of a comprehensive MRSA colonization surveillance program. It is not intended to diagnose MRSA infection nor to guide or monitor treatment for MRSA infections.     Coagulation Studies: No results for input(s): LABPROT, INR in the last 72 hours.  Urinalysis: No results for input(s): COLORURINE, LABSPEC, PHURINE, GLUCOSEU, HGBUR, BILIRUBINUR, KETONESUR, PROTEINUR, UROBILINOGEN, NITRITE, LEUKOCYTESUR in the last 72  hours.  Invalid input(s): APPERANCEUR    Imaging: No results found.   Medications:    . amiodarone  400 mg Oral Daily  . docusate sodium  100 mg Oral BID  . feeding supplement (NEPRO CARB STEADY)  237 mL Oral BID BM  . midodrine  2.5 mg Oral TID WC  . sodium chloride flush  3 mL Intravenous Q12H  . tuberculin  5 Units Intradermal Once   acetaminophen, bisacodyl, heparin, [DISCONTINUED] ondansetron **OR** ondansetron (ZOFRAN) IV  Assessment/ Plan:   Mr. Ruben Skoczylas Sr. is a 83 y.o. white male with systolic congestive heart failure EF of  10-15%, hypertension, atrial fibrillation, BPH, cerebrovascular disease, who was admitted to [facility] on 03/09/2017   1. Acute renal failure on chronic kidney disease stage IV with proteinuria: nonoliguric.  Baseline creatinine of 2.2, GFR of 28 on 10/12 Acute renal failure from ATN, prerenal azotemia, overdiuresis and/or acute cardiorenal syndrome.   Holding furosemide, metolazone and potassium.  - Renal function is worse.  -PermCath placement today Acute renal failure dialysis for now. Patient's family has opted to apply to [facility] dialysis Will likely start with twice a week treatment and monitor Evaluate daily for need of further dialysis  2. Hypotension: with systolic congestive heart failure EF 10-15% and atrial fibrillation.  Holding all diuretics and continue to monitor.  Discontinued carvedilol and tamsulosin  3. Obstructive uropathy: foley catheter placed. Previously was following with Dr. Pilar Hudson, [facility].  Renal ultrasound from 10/17 showed no hydronephrosis, echogenic renal parenchyma -If blood pressure improves, consider restarting tamsulosin Patient was seen by urologist in July 2018 [name] reports that he was able to void normally at home.  Consider voiding trial prior to d/c  4.  Small bowel obstruction Seen on CT on October 14.  Transition at small paraumbilical hernia where there is a herniation of a short loop of small bowel.  Conservative treatment.   LOS: 9 Ruben Hudson 10/23/20182:23 PM

## 2017-03-18 NOTE — Progress Notes (Signed)
PT Cancellation Note  Patient Details Name: Ruben Yankowski Sr. MRN: [id number] DOB: 16-Feb-1926   Cancelled Treatment:    Reason Eval/Treat Not Completed: Patient at procedure or test/unavailable.  Pt currently off floor for dialysis.  Will re-attempt PT treatment at a later date/time.  Leitha Bleak, PT 03/18/17, 10:14 AM [phone]

## 2017-03-18 NOTE — Progress Notes (Signed)
HD initiated without issue via R Chest HD catheter. Heparin prn if needed to keep system patent per Dr. Candiss Norse. Patient currently without complaints. Denies pain. Lungs clear. Continue to monitor.

## 2017-03-18 NOTE — Progress Notes (Signed)
Pre dialysis  

## 2017-03-18 NOTE — Progress Notes (Signed)
ANTICOAGULATION CONSULT NOTE - Initial Consult  Pharmacy Consult for Warfarin Dosing  Indication: atrial fibrillation  No Known Allergies  Patient Measurements: Height: 5\' 9"  (175.3 cm) Weight: 159 lb 9.8 oz (72.4 kg) IBW/kg (Calculated) : 70.7  Vital Signs: Temp: 97.6 F (36.4 C) (10/23 1133) Temp Source: Oral (10/23 1133) BP: 121/90 (10/23 1133) Pulse Rate: 30 (10/23 1133)  Labs:  Recent Labs  03/16/17 0425 03/17/17 0712 03/18/17 0427  HGB  --   --  13.1  HCT  --   --  39.5*  PLT  --   --  160  CREATININE 6.18* 6.31* 5.54*    Estimated Creatinine Clearance: 9 mL/min (A) (by C-G formula based on SCr of 5.54 mg/dL (H)).   Medical History: Past Medical History:  Diagnosis Date  . Acute pulmonary edema ([place]) 02/26/2013  . Benign prostatic hyperplasia with urinary obstruction 02/24/2012  . BP (high blood pressure) 10/31/2015  . Cataract extraction status of left eye   . Cataract extraction status of right eye   . Cerebrovascular accident (CVA) ([place]) 04/24/2012  . Chronic kidney disease 02/16/2014   stage 2  . Congestive heart failure ([place]) 10/31/2015  . HLD (hyperlipidemia) 10/31/2015  . Hydrocele 02/25/2012  . Unstable angina pectoris ([place]) 02/26/2013  . Urge incontinence of urine 05/30/2014  . Urinary urgency 10/31/2015    Assessment: Pharmacy consulted for warfarin dosing in a 83 yo male with CKD, SBO and PMH of A. Fib. Patient was on Eliquis but this has been discontinued due to worsening renal function.  Patient also started on Amiodarone this admission.   Goal of Therapy:  INR 2-3 Monitor platelets by anticoagulation protocol: Yes   Plan:  Will start patient on warfarin 5mg  x 1 dose tonight. INRs daily until INR therapeutic.  Pharmacy to consult prior to discharge.   Pernell Dupre, PharmD, BCPS Clinical Pharmacist 03/18/2017 3:03 PM

## 2017-03-18 NOTE — Progress Notes (Signed)
Palliative follow-up with patient and wife at bedside. Sitting up in recliner eating breakfast. Complains of nausea but declining prn Zofran at this time. Encouraged him to ask nursing for prn dose if nausea continues. HD planned for this morning. No questions or concerns. Wife has palliative team contact information.  NO CHARGE  Ihor Dow, FNP-C Palliative Medicine Team  Phone: [phone] Fax: [phone]

## 2017-03-18 NOTE — Progress Notes (Signed)
Initial Nutrition Assessment  DOCUMENTATION CODES:   Severe malnutrition in context of chronic illness  INTERVENTION:  1. Nepro Shake po BID, each supplement provides 425 kcal and 19 grams protein  NUTRITION DIAGNOSIS:   Malnutrition related to chronic illness (CKD, CHF) as evidenced by severe depletion of body fat, severe depletion of muscle mass.  GOAL:   Patient will meet greater than or equal to 90% of their needs  MONITOR:   PO intake, I & O's, Labs, Weight trends, Supplement acceptance  REASON FOR ASSESSMENT:   Malnutrition Screening Tool    ASSESSMENT:   Ruben Hudson has a PMH of PAF, CKD 4, HTN, CHF, presents with SBO, acute on chronic CHF. Patiently initially admitted with nausea and coffee ground emesis. SBO was due to incarcerated umbilical hernia, which was reduced by surgery upon admission. Patient with acute on chronic kidney disease IV with proteinuria, nonoliguric, permcath placed, started on HD today.  Spoke with patient in HD, patient's wife. Wife provides most history. She states he has a UBW of 145-148 pounds but has gained weight due to edema. Reports loss of "muscle tone." NFPE severe all over. He has had a poor appetite for a couple weeks. Normally eats cornflakes, milk, a banana, toast, and  Sometimes a slice of bacon for breakfast. May eat a ham, tomato and cheese sandwich sometimes. For lunch, normally eats a soup or sandwich depending on the weather. Eats a meat, starch, and vegetable for dinner. Seems to meet PO needs at home. He did not eat much this morning, was feeling nauseous. He denies nausea during time of visit. RD Also provided patient's wife with diet education. Potassium and Phosphorus Content of Foods Handouts provided. Discussed with wife potassium limit of 3.5g per day, Phosphorus limit of 1g per day. Stressed the importance of monitoring labs and discussing with nephrologist as well as dietitian at dialysis center if patient remains on  dialysis as an outpatient. Teach back method used. Wife verbalized understanding. She does all of the cooking and grocery shopping.  Labs reviewed:  BUN/Creatinine 85/5.54, Phos 5.3, Mg 2.6 Medications reviewed and include:  Colace  300 UOP today   Diet Order:  Diet renal/carb modified with fluid restriction Diet-HS Snack? Nothing; Room service appropriate? Yes; Fluid consistency: Thin  Skin:  Reviewed, no issues  Last BM:  03/17/2017 (Type 3)  Height:   Ht Readings from Last 1 Encounters:  03/09/17 5\' 9"  (1.753 m)    Weight:   Wt Readings from Last 1 Encounters:  03/18/17 159 lb 6.3 oz (72.3 kg)    Ideal Body Weight:  72.72 kg  BMI:  Body mass index is 23.54 kg/m.  Estimated Nutritional Needs:   Kcal:  1950-2340 calories (SBW x25-30)  Protein:  98-112 grams (1.5-1.7g/kg)  Fluid:  UOP +1L  EDUCATION NEEDS:   Education needs addressed  Satira Anis. Kendra Woolford, MS, RD LDN Inpatient Clinical Dietitian Pager [phone]

## 2017-03-18 NOTE — Care Management (Signed)
Spoke with Dr Candiss Norse- nephrology and informed that dialysis is acute and it is anticipated that patient will require dialysis after discharge but may not be long term. PPD order is present but CM not sure if this has been applied.  Reaching out to primary nurse.  Order present for hepatitis screen.  Per Dr Candiss Norse, it is anticipated that patient will go to [facility] in [place].

## 2017-03-19 LAB — HEPATITIS B SURFACE ANTIGEN: Hepatitis B Surface Ag: NEGATIVE

## 2017-03-19 LAB — HEPATITIS B SURFACE ANTIBODY, QUANTITATIVE: HEPATITIS B-POST: 26.2 m[IU]/mL

## 2017-03-19 LAB — PROTIME-INR
INR: 1.29
Prothrombin Time: 16 seconds — ABNORMAL HIGH (ref 11.4–15.2)

## 2017-03-19 LAB — HEPATITIS B CORE ANTIBODY, TOTAL: Hep B Core Total Ab: NEGATIVE

## 2017-03-19 MED ORDER — WARFARIN SODIUM 5 MG PO TABS
5.0000 mg | ORAL_TABLET | Freq: Every day | ORAL | Status: AC
  Administered 2017-03-19: 5 mg via ORAL
  Filled 2017-03-19: qty 1

## 2017-03-19 NOTE — Progress Notes (Signed)
Physical Therapy Treatment Patient Details Name: Ruben Hudson. MRN: [id number] DOB: June 09, 1925 Today's Date: 03/19/2017    History of Present Illness Pt is an 83 y.o. M with a PMH including a-fib, CVA, CKD stage 2, CHF, HLD and umbilical herniation. Presented to ED on 10/14 with abdominal pain and swelling and emesis secondary to small bowel obstruction, a-fib with RVR, acute on chronic systolic CHF, acute on chronic CKD stage 3, elevated troponin and HLD. Imaging confirms herniation of small loop of bowel, small bilateral pleural effusions, mild decrease in liver attenuation which may represent steatosis or vascular congestion from heart failure, pulmonary vascular congestion. Umbilical hernia reduced on 03/09/17; per previous notes, remains reduced. Pt recieved RIJ permcath 03/17/17; began HD 03/17/17.    PT Comments    Pt able to perform exercises in recliner and increase ambulation distance. Sit to/from stand transfers with supervision for safety; walked 28ft with RW, min guard for safety. No difficulties noted with any tasks. HR monitored throughout session, ranged from 96-107bpm with activity. Next session plan to progress activity tolerance; navigate stairs.   Follow Up Recommendations  Home health PT;Supervision for mobility/OOB     Equipment Recommendations  Rolling walker with 5" wheels    Recommendations for Other Services       Precautions / Restrictions Precautions Precautions: Fall Restrictions Weight Bearing Restrictions: No    Mobility  Bed Mobility               General bed mobility comments: deferred, pt in recliner  Transfers Overall transfer level: Needs assistance   Transfers: Sit to/from Stand Sit to Stand: Supervision         General transfer comment: able to perform sit to stand without any difficulties noted; B armrest use for sit to/from standing on recliner and BSC; supervision for safety  Ambulation/Gait Ambulation/Gait assistance:  Min guard Ambulation Distance (Feet): 60 Feet Assistive device: Rolling walker (2 wheeled)       General Gait Details: decreased stride length bilaterally, decreased gait speed   Stairs            Wheelchair Mobility    Modified Rankin (Stroke Patients Only)       Balance Overall balance assessment: Needs assistance Sitting-balance support: Feet supported Sitting balance-Leahy Scale: Good     Standing balance support: Bilateral upper extremity supported;During functional activity Standing balance-Leahy Scale: Fair Standing balance comment: able to perform standing balance without assist while wife assisted in personal hygeine needs and pulling up pants after using BSC; RW use for balance during gait                            Cognition Arousal/Alertness: Awake/alert Behavior During Therapy: WFL for tasks assessed/performed Overall Cognitive Status: Within Functional Limits for tasks assessed                                        Exercises Total Joint Exercises Ankle Circles/Pumps: AROM;Both;15 reps Quad Sets: AROM;Both;15 reps;Strengthening Heel Slides: AROM;Both;15 reps Long Arc Quad: AROM;Both;15 reps    General Comments        Pertinent Vitals/Pain Pain Assessment: No/denies pain    Home Living                      Prior Function  PT Goals (current goals can now be found in the care plan section) Acute Rehab PT Goals Patient Stated Goal: to be able to go home PT Goal Formulation: With patient/family Time For Goal Achievement: 03/25/17 Potential to Achieve Goals: Good Progress towards PT goals: Progressing toward goals    Frequency    Min 2X/week      PT Plan Current plan remains appropriate    Co-evaluation              AM-PAC PT "6 Clicks" Daily Activity  Outcome Measure  Difficulty turning over in bed (including adjusting bedclothes, sheets and blankets)?: A Little Difficulty  moving from lying on back to sitting on the side of the bed? : A Little Difficulty sitting down on and standing up from a chair with arms (e.g., wheelchair, bedside commode, etc,.)?: A Little Help needed moving to and from a bed to chair (including a wheelchair)?: A Little Help needed walking in hospital room?: A Little Help needed climbing 3-5 steps with a railing? : A Lot 6 Click Score: 17    End of Session Equipment Utilized During Treatment: Gait belt;Oxygen Activity Tolerance: Patient tolerated treatment well Patient left: in chair;with family/visitor present (PT educated pt and wife on calling nurse when he wants to get out of the chair) Nurse Communication: Mobility status (chair alarm not on chair, wife in pt's room) PT Visit Diagnosis: Unsteadiness on feet (R26.81);Muscle weakness (generalized) (M62.81)     Time: 3500-9381 PT Time Calculation (min) (ACUTE ONLY): 26 min  Charges:                       G CodesWetzel Bjornstad, SPT 03/19/2017, 3:01 PM

## 2017-03-19 NOTE — Progress Notes (Signed)
[facility]  ROUNDING NOTE   Subjective:   Patient underwent second dialysis treatment yesterday successfully This morning, he is sitting up in the chair.  His wife states that his appetite is little better.  He feels like he has more energy.  He tried Nepro but did not like it.  No nausea or vomiting.  No shortness of breath.  Objective:  Vital signs in last 24 hours:  Temp:  [97.4 F (36.3 C)-98.1 F (36.7 C)] 97.5 F (36.4 C) (10/24 0749) Pulse Rate:  [30-111] 106 (10/24 0749) Resp:  [17-29] 18 (10/24 0334) BP: (97-121)/(62-91) 99/62 (10/24 0749) SpO2:  [92 %-100 %] 100 % (10/24 0749) Weight:  [69.6 kg (153 lb 8 oz)-72.4 kg (159 lb 9.8 oz)] 69.6 kg (153 lb 8 oz) (10/24 0334)  Weight change: 0.042 kg (1.5 oz) Filed Weights   03/18/17 0920 03/18/17 1133 03/19/17 0334  Weight: 72.3 kg (159 lb 6.3 oz) 72.4 kg (159 lb 9.8 oz) 69.6 kg (153 lb 8 oz)    Intake/Output: I/O last 3 completed shifts: In: 363 [P.O.:360; I.V.:3] Out: 451 [Urine:450; Other:1]   Intake/Output this shift:  Total I/O In: 240 [P.O.:240] Out: 100 [Urine:100]  Physical Exam: General: NAD, sitting iin chair  Head: Normocephalic, atraumatic. Moist oral mucosal membranes  Eyes: Anicteric,   Neck: Supple,  Lungs:   Normal respiratory effort, mild crackles at the bases  Heart: irregular  Abdomen:  Soft, nontender  Extremities: + Tight peripheral edema.  Neurologic: Nonfocal, moving all four extremities, able to follow commands   Right IJ PermCath/Dr. Dew/03/17/2017  GU: Foley with yellow urine    Basic Metabolic Panel:  Recent Labs Lab 03/14/17 0653 03/15/17 0444 03/16/17 0425 03/17/17 0712 03/18/17 0427  NA 137 134* 134* 134* 136  K 4.0 4.0 4.0 4.0 4.4  CL 100* 99* 98* 102 101  CO2 25 24 23 23 23   GLUCOSE 102* 104* 101* 106* 121*  BUN 97* 95* 101* 101* 85*  CREATININE 5.67* 5.88* 6.18* 6.31* 5.54*  CALCIUM 9.0 8.6* 8.8* 8.9 8.9  PHOS 6.6* 6.1*  --  5.9* 5.3*    Liver  Function Tests:  Recent Labs Lab 03/14/17 0653 03/15/17 0444 03/17/17 0712 03/18/17 0427  ALBUMIN 3.5 3.4* 3.5 3.6   No results for input(s): LIPASE, AMYLASE in the last 168 hours. No results for input(s): AMMONIA in the last 168 hours.  CBC:  Recent Labs Lab 03/13/17 0447 03/18/17 0427  WBC 5.1 6.5  HGB 12.6* 13.1  HCT 38.0* 39.5*  MCV 94.6 94.0  PLT 129* 160    Cardiac Enzymes: No results for input(s): CKTOTAL, CKMB, CKMBINDEX, TROPONINI in the last 168 hours.  BNP: Invalid input(s): POCBNP  CBG: No results for input(s): GLUCAP in the last 168 hours.  Microbiology: Results for orders placed or performed during the hospital encounter of 03/09/17  MRSA PCR Screening     Status: None   Collection Time: 03/09/17 10:28 PM  Result Value Ref Range Status   MRSA by PCR NEGATIVE NEGATIVE Final    Comment:        The GeneXpert MRSA Assay (FDA approved for NASAL specimens only), is one component of a comprehensive MRSA colonization surveillance program. It is not intended to diagnose MRSA infection nor to guide or monitor treatment for MRSA infections.     Coagulation Studies:  Recent Labs  03/19/17 0612  LABPROT 16.0*  INR 1.29    Urinalysis: No results for input(s): COLORURINE, LABSPEC, PHURINE, GLUCOSEU, [place], BILIRUBINUR,  KETONESUR, PROTEINUR, UROBILINOGEN, NITRITE, LEUKOCYTESUR in the last 72 hours.  Invalid input(s): APPERANCEUR    Imaging: No results found.   Medications:    . amiodarone  400 mg Oral Daily  . docusate sodium  100 mg Oral BID  . feeding supplement (NEPRO CARB STEADY)  237 mL Oral BID BM  . midodrine  2.5 mg Oral TID WC  . sodium chloride flush  3 mL Intravenous Q12H  . tuberculin  5 Units Intradermal Once  . Warfarin - Pharmacist Dosing Inpatient   Does not apply q1800   acetaminophen, bisacodyl, heparin, [DISCONTINUED] ondansetron **OR** ondansetron (ZOFRAN) IV  Assessment/ Plan:   [name] is a 83 y.o.  white male with systolic congestive heart failure EF of 10-15%, hypertension, atrial fibrillation, BPH, cerebrovascular disease, who was admitted to [facility] on 03/09/2017   1. Acute renal failure on chronic kidney disease stage IV with proteinuria: nonoliguric.  Baseline creatinine of 2.2, GFR of 28 on 10/12 Acute renal failure from ATN, prerenal azotemia, overdiuresis and/or acute cardiorenal syndrome.   Holding furosemide, metolazone   -Acute renal failure dialysis for now.  Will hold dialysis today.  Follow up labs tomorrow Patient's family has opted to apply to [place] dialysis.  Discharge planning is in progress. Will likely start with twice a week treatment and monitor as outpatient Evaluate daily for need of further dialysis  2. Hypotension: with systolic congestive heart failure EF 10-15% and atrial fibrillation.  Holding all diuretics and continue to monitor.  Discontinued carvedilol and tamsulosin  3. Obstructive uropathy: foley catheter placed. Previously was following with Dr. Pilar Jarvis, [facility].  Renal ultrasound from 10/17 showed no hydronephrosis, echogenic renal parenchyma -If blood pressure improves, consider restarting tamsulosin Patient was seen by urologist in July 2018  Consider voiding trial prior to d/c  4.  Small bowel obstruction Seen on CT on October 14.  Transition at small paraumbilical hernia where there is a herniation of a short loop of small bowel.  Conservative treatment.  Clinically improved.   LOS: 10 Ruben Hudson 10/24/201810:29 AM

## 2017-03-19 NOTE — Progress Notes (Addendum)
[place] [facility] [place] NAME: Ruben Hudson    MR#:  [id number]  DATE OF BIRTH:  06/26/1925  SUBJECTIVE:    better SOB and generalized weakness, on O2 [place] 2L. He got HD for the past 2 days REVIEW OF SYSTEMS:    Review of Systems  Constitutional: Negative for fever, chills weight loss. Generalized weakness. HENT: Negative for ear pain, nosebleeds, congestion, facial swelling, rhinorrhea, neck pain, neck stiffness and ear discharge.   Respiratory: Negative for cough, better shortness of breath, no wheezing  Cardiovascular: Negative for chest pain, palpitations and leg swelling.  Gastrointestinal: Negative for heartburn, abdominal pain, vomiting, diarrhea or consitpation Genitourinary: Negative for dysuria, urgency, frequency, hematuria Musculoskeletal: Negative for back pain or joint pain Neurological: Negative for dizziness, seizures, syncope, focal weakness,  numbness and headaches.  Hematological: Does not bruise/bleed easily.  Psychiatric/Behavioral: Negative for hallucinations, confusion, dysphoric mood  DRUG ALLERGIES:  No Known Allergies  VITALS:  Blood pressure 99/62, pulse (!) 106, temperature (!) 97.5 F (36.4 C), temperature source Oral, resp. rate 18, height 5\' 9"  (1.753 m), weight 153 lb 8 oz (69.6 kg), SpO2 100 %.  PHYSICAL EXAMINATION:  Constitutional: Severe malnutrition. In no distress. HENT: Normocephalic. Marland Kitchen Oropharynx is clear and moist.  Eyes: Conjunctivae and EOM are normal. PERRLA, no scleral icterus.  Neck: Normal ROM. Neck supple. No JVD. No tracheal deviation. CVS: IRR, IRR, S1/S2 +,2/6 SEM no gallops, no carotid bruit.  Pulmonary: Effort and breath sounds normal, no stridor, rhonchi, wheezes, no basilar rales.  Abdominal: Soft., + vental hernia palpable good bowel sounds no distension, tenderness, rebound or guarding.  Musculoskeletal: Normal range of motion. No edema and no tenderness.  Neuro: Alert. CN 2-12 grossly  intact. No focal deficits. Generalized weakness. Skin: Skin is warm and dry. No rash noted. Psychiatric: Normal mood and affect.   LABORATORY PANEL:   CBC  Recent Labs Lab 03/18/17 0427  WBC 6.5  HGB 13.1  HCT 39.5*  PLT 160   ------------------------------------------------------------------------------------------------------------------  Chemistries   Recent Labs Lab 03/18/17 0427  NA 136  K 4.4  CL 101  CO2 23  GLUCOSE 121*  BUN 85*  CREATININE 5.54*  CALCIUM 8.9   ------------------------------------------------------------------------------------------------------------------  Cardiac Enzymes No results for input(s): TROPONINI in the last 168 hours. ------------------------------------------------------------------------------------------------------------------  RADIOLOGY:  No results found.   ASSESSMENT AND PLAN:   83 y/o make with PAF, CKD stage 3 and HTN presents with abdominal pain and found to have small bowel obstruction with Ruben Hudson hernia.  1. Small bowel obstruction with resolved incarceration from umbilical hernia Surgery consultation appreciated Abdomen is soft and obstruction has resolved Does not need to undergo surgery He tolerated diet.  2. Atrial fibrillation:  Heart rate has improved on amiodarone and Coreg  transitioned to oral amiodarone  Resumed eliquis , but stopped due to worsening renal func. Can start Coumadin per Dr. Candiss Norse. I discussed with the patient and his wife about the benefits and side effect of bleeding of Coumadin.  3. Acute on chronic systolic heart failure with ejection fraction less than 10-15% by last echo in March Watch for fluid overload HD for 2 days, Possible hemodialysis today per Dr. Candiss Norse.  Chronic respiratory failure on home oxygen 2 L. NEB prn.  4. BPH: discontinued Flomax due to hypotension.  5. Elevated troponin with baseline chronic elevation troponin   Patient has ruled out for ACS.  6.  Acute renal failure on chronic kidney disease stage IV  with proteinuria: nonoliguric. Pt had hypotension, encourage oral intake, stopped tamsulosin also.   Added midodrine. S/P permCath placement. Evaluate daily for need of further dialysis per Dr. Candiss Norse.  I discussed with Dr. Candiss Norse. PT evaluation suggested PT home health. Palliative care consult: full code. Outpatient palliative care follow-up.  Management plans discussed with the patient and wife and they are in agreement.  CODE STATUS: FULL CODE  TOTAL TIME TAKING CARE OF THIS PATIENT: 33 minutes.    POSSIBLE D/C 1-2 days, DEPENDING ON CLINICAL CONDITION.   [name] M.D on 03/19/2017 at 11:10 AM  Between 7am to 6pm - Pager - [phone] After 6pm go to [email or web address] - password [email or web address] [facility]  Office  [phone]  CC: Primary care physician; Isaias Cowman, MD  Note: This dictation was prepared with Dragon dictation along with smaller phrase technology. Any transcriptional errors that result from this process are unintentional.

## 2017-03-19 NOTE — Progress Notes (Signed)
ANTICOAGULATION CONSULT NOTE - Initial Consult  Pharmacy Consult for Warfarin Dosing  Indication: atrial fibrillation  No Known Allergies  Patient Measurements: Height: 5\' 9"  (175.3 cm) Weight: 153 lb 8 oz (69.6 kg) IBW/kg (Calculated) : 70.7  Vital Signs: Temp: 97.5 F (36.4 C) (10/24 0749) Temp Source: Oral (10/24 0749) BP: 99/62 (10/24 0749) Pulse Rate: 106 (10/24 0749)  Labs:  Recent Labs  03/17/17 0712 03/18/17 0427 03/19/17 0612  HGB  --  13.1  --   HCT  --  39.5*  --   PLT  --  160  --   LABPROT  --   --  16.0*  INR  --   --  1.29  CREATININE 6.31* 5.54*  --     Estimated Creatinine Clearance: 8.9 mL/min (A) (by C-G formula based on SCr of 5.54 mg/dL (H)).   Medical History: Past Medical History:  Diagnosis Date  . Acute pulmonary edema ([place]) 02/26/2013  . Benign prostatic hyperplasia with urinary obstruction 02/24/2012  . BP (high blood pressure) 10/31/2015  . Cataract extraction status of left eye   . Cataract extraction status of right eye   . Cerebrovascular accident (CVA) ([place]) 04/24/2012  . Chronic kidney disease 02/16/2014   stage 2  . Congestive heart failure ([place]) 10/31/2015  . HLD (hyperlipidemia) 10/31/2015  . Hydrocele 02/25/2012  . Unstable angina pectoris ([place]) 02/26/2013  . Urge incontinence of urine 05/30/2014  . Urinary urgency 10/31/2015    Assessment: Pharmacy consulted for warfarin dosing in a 83 yo male with CKD, SBO and PMH of A. Fib. Patient was on Eliquis but this has been discontinued due to worsening renal function.  Patient also started on Amiodarone this admission.    Goal of Therapy:  INR 2-3 Monitor platelets by anticoagulation protocol: Yes   Plan:  INR 1.29. Will start warfarin 5 mg PO daily. INRs daily until INR therapeutic.  Pharmacy to counsel prior to discharge.   Larene Beach, PharmD, BCPS Clinical Pharmacist 03/19/2017 11:36 AM

## 2017-03-19 NOTE — Care Management (Signed)
All diuretics on hold.  Two dialysis treatments and has tolerated without issues.  Informed patient is able to sit for treatments. Holding dialysis today and assess labs 10/25.

## 2017-03-19 NOTE — Progress Notes (Signed)
Patient is irritable and easily agitated about little things.  Oriented x 4, yet misinterprets communication from staff. I.e. Dialysis was held today so he wanted his perm cath removed as he didn't need it anymore.  When the situation is explained to him he gets aggravated.

## 2017-03-20 LAB — BASIC METABOLIC PANEL
Anion gap: 13 (ref 5–15)
BUN: 72 mg/dL — AB (ref 6–20)
CALCIUM: 9 mg/dL (ref 8.9–10.3)
CO2: 26 mmol/L (ref 22–32)
CREATININE: 5.16 mg/dL — AB (ref 0.61–1.24)
Chloride: 99 mmol/L — ABNORMAL LOW (ref 101–111)
GFR calc non Af Amer: 9 mL/min — ABNORMAL LOW (ref >60)
GFR, EST AFRICAN AMERICAN: 10 mL/min — AB (ref >60)
GLUCOSE: 120 mg/dL — AB (ref 65–99)
Potassium: 3.8 mmol/L (ref 3.5–5.1)
Sodium: 138 mmol/L (ref 135–145)

## 2017-03-20 LAB — PROTIME-INR
INR: 1.47
PROTHROMBIN TIME: 17.7 s — AB (ref 11.4–15.2)

## 2017-03-20 MED ORDER — MIDODRINE HCL 2.5 MG PO TABS: 2.5000 mg | ORAL_TABLET | Freq: Three times a day (TID) | ORAL | 1 refills | Status: AC

## 2017-03-20 MED ORDER — AMIODARONE HCL 200 MG PO TABS: 200.0000 mg | ORAL_TABLET | Freq: Every day | ORAL | 1 refills | Status: DC

## 2017-03-20 MED ORDER — FUROSEMIDE 40 MG PO TABS: 40.0000 mg | ORAL_TABLET | Freq: Every day | ORAL | 1 refills | Status: DC

## 2017-03-20 MED ORDER — WARFARIN SODIUM 5 MG PO TABS
5.0000 mg | ORAL_TABLET | Freq: Once | ORAL | Status: DC
  Filled 2017-03-20: qty 1

## 2017-03-20 NOTE — Progress Notes (Signed)
Back from dialysis.

## 2017-03-20 NOTE — Progress Notes (Signed)
Pt discharged to home via wc.  Instructions and rx given to pt.  Questions answered.  No distress.  

## 2017-03-20 NOTE — Care Management (Addendum)
PPD to be read later this afternoon and anticipate results will be without induration.  Spoke with Ruben Hudson with [facility] and  dialysis schedule will be on Tuesdays and Saturdays around 11A.  Will not be able to start until 03/24/2017.  Ruben Hudson is in total agreement with home health services.  Notified [facility] and referral for SN PT OT Aide accepted.  Requested order from attending and also requested DME order for hospital bed in the event patient and his wife decide  it is needed. [name] will transport patient home.  will have to navigate 3 steps to get inside the home.  She says this will not be a problem. Ruben Hudson confirmed the HD times and informed patient's wife  Medical Necessity For Hospital Bed   Patient suffers from  chf and has trouble breathing at night when head is elevated less  than 30  degrees. Bed wedges do not provide enough elevation to resolve breathing issues. Shortness of breath cause patient to require frequent changes in body position which cannot be achieved with a normal bed

## 2017-03-20 NOTE — Discharge Summary (Signed)
[place] [facility] [place] NAME: Ruben Hudson    MR#:  [id number]  DATE OF BIRTH:  Mar 09, 1926  DATE OF ADMISSION:  03/09/2017 ADMITTING PHYSICIAN: Quintella Baton, MD  DATE OF DISCHARGE: 03/20/2017  PRIMARY CARE PHYSICIAN: Leonel Ramsay, MD    ADMISSION DIAGNOSIS:  Small bowel obstruction ([place]) [K56.609] Hypoxia [R09.02] Encounter for nasogastric tube placement [Z46.59] Hypervolemia, unspecified hypervolemia type [E87.70]  DISCHARGE DIAGNOSIS:  Active Problems:   Chronic kidney disease, stage 3 (HCC)   Hyperlipidemia   Acute on chronic systolic CHF (congestive heart failure) (HCC)   Small bowel obstruction (HCC)   Atrial fibrillation with RVR ([place])   Palliative care by specialist   Goals of care, counseling/discussion   SECONDARY DIAGNOSIS:   Past Medical History:  Diagnosis Date  . Acute pulmonary edema ([place]) 02/26/2013  . Benign prostatic hyperplasia with urinary obstruction 02/24/2012  . BP (high blood pressure) 10/31/2015  . Cataract extraction status of left eye   . Cataract extraction status of right eye   . Cerebrovascular accident (CVA) ([place]) 04/24/2012  . Chronic kidney disease 02/16/2014   stage 2  . Congestive heart failure ([place]) 10/31/2015  . HLD (hyperlipidemia) 10/31/2015  . Hydrocele 02/25/2012  . Unstable angina pectoris ([place]) 02/26/2013  . Urge incontinence of urine 05/30/2014  . Urinary urgency 10/31/2015    HOSPITAL COURSE:   83 year old male with past medical history of chronic systolic CHF, BPH, hypertension, hyperlipidemia, chronic kidney disease stage III who presented to the hospital due to abdominal pain and also noted to have small bowel obstruction along with atrial fibrillation with rapid ventricular response.  1. Abdominal pain-this was secondary to small bowel obstruction secondary to the umbilical hernia. -Patient was treated supportively with NG tube decompression, IV fluids and antibiotics. With  supportive care patient's symptoms improved and his bowel obstruction has not resolved. He is tolerating by mouth well and denies any further abdominal pain nausea vomiting.  2. Atrial fibrillation with rapid ventricular response-patient presented to the hospital with worsening heart rates. -This was managed initially with IV Cardizem and then given IV amiodarone. Patient has been switched over from IV amiodarone to oral amiodarone but she is tolerating presently. His heart rates are currently stable. He will continue low-dose amiodarone for rate control. -He will continue Eliquis for anticoagulation.  3. Acute on chronic renal failure-patient has acute on chronic kidney disease stage IV. He has proteinuria but is not oliguric. -Patient's creatinine got worse in the hospital, and therefore nephrology consult was obtained. Patient's diuretics were held. Patient's antihypertensives were held. He was initiated on hemodialysis and is presently being discharged on dialysis as an outpatient. He will receive hemodialysis as an outpatient twice a week. -We will continue to hold his antihypertensives for now. His Lasix at a low dose is presently being resumed.  4. Hypotension-this is secondary to his severe cardiomyopathy with ejection fraction of 10-15% -Patient was initiated on Midodrine and is being discharged on that presently.   5. BPH-patient will continue his Flomax. He'll follow up with urology as an outpatient.  6. CHF-acute on chronic systolic CHF. Patient's volume status has improved with hemodialysis. -he'll resume his Lasix at a low dose. His other cardiac meds including Entresto, carvedilol currently being held due to relative hypotension and worsening renal failure. These can be resumed further as an outpatient.  DISCHARGE CONDITIONS:   Stable.   CONSULTS OBTAINED:  Treatment Team:  Teodoro Spray, MD Lavonia Dana, MD  DRUG ALLERGIES:  No Known Allergies  DISCHARGE  MEDICATIONS:   Allergies as of 03/20/2017   No Known Allergies     Medication List    STOP taking these medications   betamethasone dipropionate 0.05 % cream Commonly known as:  DIPROLENE   carvedilol 3.125 MG tablet Commonly known as:  COREG   docusate sodium 100 MG capsule Commonly known as:  COLACE   ENTRESTO 24-26 MG Generic drug:  sacubitril-valsartan     TAKE these medications   ALPRAZolam 0.25 MG tablet Commonly known as:  XANAX Take 0.25 mg by mouth at bedtime as needed for sleep.   amiodarone 200 MG tablet Commonly known as:  PACERONE Take 1 tablet (200 mg total) by mouth daily.   apixaban 2.5 MG Tabs tablet Commonly known as:  ELIQUIS Take 1 tablet (2.5 mg total) by mouth 2 (two) times daily.   aspirin 81 MG EC tablet Take 1 tablet (81 mg total) by mouth daily.   atorvastatin 10 MG tablet Commonly known as:  LIPITOR Take 10 mg by mouth daily.   furosemide 40 MG tablet Commonly known as:  LASIX Take 1 tablet (40 mg total) by mouth daily. What changed:  medication strength  how much to take  when to take this   KLOR-CON M10 10 MEQ tablet Generic drug:  potassium chloride Take 10 mEq by mouth daily.   midodrine 2.5 MG tablet Commonly known as:  PROAMATINE Take 1 tablet (2.5 mg total) by mouth 3 (three) times daily with meals.   tamsulosin 0.4 MG Caps capsule Commonly known as:  FLOMAX Take 0.4 mg by mouth. In am.            Durable Medical Equipment        Start     Ordered   03/20/17 1406  For home use only DME Hospital bed  Once    Question Answer Comment  The above medical condition requires: Patient requires the ability to reposition frequently   Bed type Semi-electric      03/20/17 1405        DISCHARGE INSTRUCTIONS:   DIET:  Cardiac diet  DISCHARGE CONDITION:  Stable  ACTIVITY:  Activity as tolerated  OXYGEN:  Home Oxygen: Yes.     Oxygen Delivery: 2 liters/min via Patient connected to nasal cannula  oxygen  DISCHARGE LOCATION:  Home with Home Health PT, OT, Nursing, Home health Aide.    If you experience worsening of your admission symptoms, develop shortness of breath, life threatening emergency, suicidal or homicidal thoughts you must seek medical attention immediately by calling 911 or calling your MD immediately  if symptoms less severe.  You Must read complete instructions/literature along with all the possible adverse reactions/side effects for all the Medicines you take and that have been prescribed to you. Take any new Medicines after you have completely understood and accpet all the possible adverse reactions/side effects.   Please note  You were cared for by a hospitalist during your hospital stay. If you have any questions about your discharge medications or the care you received while you were in the hospital after you are discharged, you can call the unit and asked to speak with the hospitalist on call if the hospitalist that took care of you is not available. Once you are discharged, your primary care physician will handle any further medical issues. Please note that NO REFILLS for any discharge medications will be authorized once you are discharged, as it is imperative that  you return to your primary care physician (or establish a relationship with a primary care physician if you do not have one) for your aftercare needs so that they can reassess your need for medications and monitor your lab values.     Today   Seen after hemodialysis today and seems comfortable. Denies any chest pains, shortness of breath. Once to go home.  VITAL SIGNS:  Blood pressure 120/89, pulse 97, temperature (!) 97.5 F (36.4 C), temperature source Oral, resp. rate 19, height 5\' 9"  (1.753 m), weight 69.4 kg (153 lb 1.6 oz), SpO2 95 %.  I/O:   Intake/Output Summary (Last 24 hours) at 03/20/17 1537 Last data filed at 03/20/17 0852  Gross per 24 hour  Intake                3 ml  Output               100 ml  Net              -97 ml    PHYSICAL EXAMINATION:  GENERAL:  83 y.o.-year-old thin patient lying in the bed with no acute distress.  EYES: Pupils equal, round, reactive to light and accommodation. No scleral icterus. Extraocular muscles intact.  HEENT: Head atraumatic, normocephalic. Oropharynx and nasopharynx clear.  NECK:  Supple, no jugular venous distention. No thyroid enlargement, no tenderness.  LUNGS: Normal breath sounds bilaterally, no wheezing, minimal rales @ bases, No rhonchi. No use of accessory muscles of respiration.  CARDIOVASCULAR: S1, S2 normal. No murmurs, rubs, or gallops.  ABDOMEN: Soft, non-tender, non-distended. Bowel sounds present. No organomegaly or mass.  EXTREMITIES: + 1 pedal edema b/l, No cyanosis, or clubbing.  NEUROLOGIC: Cranial nerves II through XII are intact. No focal motor or sensory defecits b/l. Globally weak.  PSYCHIATRIC: The patient is alert and oriented x 3. SKIN: No obvious rash, lesion, or ulcer.   Right IJ Perm Cath in place.   DATA REVIEW:   CBC  Recent Labs Lab 03/18/17 0427  WBC 6.5  HGB 13.1  HCT 39.5*  PLT 160    Chemistries   Recent Labs Lab 03/20/17 0910  NA 138  K 3.8  CL 99*  CO2 26  GLUCOSE 120*  BUN 72*  CREATININE 5.16*  CALCIUM 9.0    Cardiac Enzymes No results for input(s): TROPONINI in the last 168 hours.  Microbiology Results  Results for orders placed or performed during the hospital encounter of 03/09/17  MRSA PCR Screening     Status: None   Collection Time: 03/09/17 10:28 PM  Result Value Ref Range Status   MRSA by PCR NEGATIVE NEGATIVE Final    Comment:        The GeneXpert MRSA Assay (FDA approved for NASAL specimens only), is one component of a comprehensive MRSA colonization surveillance program. It is not intended to diagnose MRSA infection nor to guide or monitor treatment for MRSA infections.     RADIOLOGY:  No results found.    Management plans discussed with  the patient, family and they are in agreement.  CODE STATUS:     Code Status Orders        Start     Ordered   03/09/17 2148  Full code  Continuous     03/09/17 2147    Code Status History    Date Active Date Inactive Code Status Order ID Comments User Context   08/23/2016  7:48 PM 08/25/2016  2:52 PM Full Code [id number]  Theodoro Grist, MD Inpatient    Advance Directive Documentation     Most Recent Value  Type of Advance Directive  Living will  Pre-existing out of facility DNR order (yellow form or pink MOST form)  -  "MOST" Form in Place?  -      TOTAL TIME TAKING CARE OF THIS PATIENT: 40 minutes.    Henreitta Leber M.D on 03/20/2017 at 3:37 PM  Between 7am to 6pm - Pager - [phone]  After 6pm go to [email or web address] - Proofreader  [facility]  Office  [phone]  CC: Primary care physician; Leonel Ramsay, MD

## 2017-03-20 NOTE — Plan of Care (Signed)
Problem: Safety: Goal: Ability to remain free from injury will improve Outcome: Progressing Fall precautions in place non skid socks when oob

## 2017-03-20 NOTE — Procedures (Signed)
Pt arrived to HDU via hospital transport staff at or about 0945 and was assessed prior to tx and found to have a RIJ, which was accessed per p&p w/o issue; order includes RTD 2.5 hours on 3251 bath, BFR 300, DFR 600 and attempt to achieve 0 kg fluid removal as tolerated by pt; pt initiated at 1000 w/o issue; at or about 1145, Dr. Candiss Norse was at bedside discussing care and plan and pt may d/c this evening if outpatient dialysis schedule is set; pt completed his entire tx and achieved target of 0 kg fluid removal w/o complication, all blood was rinsed back and catheter care performed per p&p; pt was stable at the end of his tx and when leaving HDU under the care of transport staff; report called to primary RN

## 2017-03-20 NOTE — Progress Notes (Signed)
   03/20/17 1432  PPD Results  Does patient have an induration at the injection site? No  Induration(mm) 0 mm  Name of Physician Notified Dr. Verdell Carmine

## 2017-03-20 NOTE — Progress Notes (Signed)
[facility]  ROUNDING NOTE   Subjective:   Patient seen during dialysis Tolerating well    HEMODIALYSIS FLOWSHEET:  Blood Flow Rate (mL/min): 250 mL/min Arterial Pressure (mmHg): -130 mmHg Venous Pressure (mmHg): 110 mmHg Transmembrane Pressure (mmHg): 50 mmHg Ultrafiltration Rate (mL/min): 300 mL/min Dialysate Flow Rate (mL/min): 600 ml/min Conductivity: Machine : 13.9 Conductivity: Machine : 13.9 Dialysis Fluid Bolus: Normal Saline Bolus Amount (mL): 200 mL Dialysate Change:  (3k 2.5ca)  States that food does not taste good therefore not eating much Wants to go home. Foley catheter has been removed and patient has been able to void on his own.  Objective:  Vital signs in last 24 hours:  Temp:  [97.4 F (36.3 C)-97.8 F (36.6 C)] 97.5 F (36.4 C) (10/25 1235) Pulse Rate:  [76-128] 97 (10/25 1235) Resp:  [14-26] 19 (10/25 1235) BP: (100-128)/(54-99) 120/89 (10/25 1235) SpO2:  [85 %-100 %] 95 % (10/25 1235) Weight:  [69.4 kg (153 lb 1.6 oz)] 69.4 kg (153 lb 1.6 oz) (10/25 0815)  Weight change:  Filed Weights   03/18/17 1133 03/19/17 0334 03/20/17 0815  Weight: 72.4 kg (159 lb 9.8 oz) 69.6 kg (153 lb 8 oz) 69.4 kg (153 lb 1.6 oz)    Intake/Output: I/O last 3 completed shifts: In: 240 [P.O.:240] Out: 400 [Urine:400]   Intake/Output this shift:  Total I/O In: 3 [I.V.:3] Out: -   Physical Exam: General: NAD, sitting iin chair  Head: Normocephalic, atraumatic. Moist oral mucosal membranes  Eyes: Anicteric,   Neck: Supple,  Lungs:   Normal respiratory effort, mild crackles at the bases  Heart: irregular  Abdomen:  Soft, nontender  Extremities: + Tight peripheral edema.  Neurologic: Nonfocal, alert, oriented   Right IJ PermCath/Dr. Dew/03/17/2017       Basic Metabolic Panel:  Recent Labs Lab 03/14/17 7001 03/15/17 0444 03/16/17 0425 03/17/17 0712 03/18/17 0427 03/20/17 0910  NA 137 134* 134* 134* 136 138  K 4.0 4.0 4.0 4.0 4.4 3.8   CL 100* 99* 98* 102 101 99*  CO2 25 24 23 23 23 26   GLUCOSE 102* 104* 101* 106* 121* 120*  BUN 97* 95* 101* 101* 85* 72*  CREATININE 5.67* 5.88* 6.18* 6.31* 5.54* 5.16*  CALCIUM 9.0 8.6* 8.8* 8.9 8.9 9.0  PHOS 6.6* 6.1*  --  5.9* 5.3*  --     Liver Function Tests:  Recent Labs Lab 03/14/17 0653 03/15/17 0444 03/17/17 0712 03/18/17 0427  ALBUMIN 3.5 3.4* 3.5 3.6   No results for input(s): LIPASE, AMYLASE in the last 168 hours. No results for input(s): AMMONIA in the last 168 hours.  CBC:  Recent Labs Lab 03/18/17 0427  WBC 6.5  HGB 13.1  HCT 39.5*  MCV 94.0  PLT 160    Cardiac Enzymes: No results for input(s): CKTOTAL, CKMB, CKMBINDEX, TROPONINI in the last 168 hours.  BNP: Invalid input(s): POCBNP  CBG: No results for input(s): GLUCAP in the last 168 hours.  Microbiology: Results for orders placed or performed during the hospital encounter of 03/09/17  MRSA PCR Screening     Status: None   Collection Time: 03/09/17 10:28 PM  Result Value Ref Range Status   MRSA by PCR NEGATIVE NEGATIVE Final    Comment:        The GeneXpert MRSA Assay (FDA approved for NASAL specimens only), is one component of a comprehensive MRSA colonization surveillance program. It is not intended to diagnose MRSA infection nor to guide or monitor treatment for MRSA infections.  Coagulation Studies:  Recent Labs  03/19/17 0612 03/20/17 0341  LABPROT 16.0* 17.7*  INR 1.29 1.47    Urinalysis: No results for input(s): COLORURINE, LABSPEC, PHURINE, GLUCOSEU, HGBUR, BILIRUBINUR, KETONESUR, PROTEINUR, UROBILINOGEN, NITRITE, LEUKOCYTESUR in the last 72 hours.  Invalid input(s): APPERANCEUR    Imaging: No results found.   Medications:    . amiodarone  400 mg Oral Daily  . docusate sodium  100 mg Oral BID  . midodrine  2.5 mg Oral TID WC  . sodium chloride flush  3 mL Intravenous Q12H  . tuberculin  5 Units Intradermal Once  . warfarin  5 mg Oral ONCE-1800  .  Warfarin - Pharmacist Dosing Inpatient   Does not apply q1800   acetaminophen, bisacodyl, heparin, [DISCONTINUED] ondansetron **OR** ondansetron (ZOFRAN) IV  Assessment/ Plan:   Mr. Mattthew Ziomek Sr. is a 83 y.o. white male with systolic congestive heart failure EF of 10-15%, hypertension, atrial fibrillation, BPH, cerebrovascular disease, who was admitted to [facility] on 03/09/2017   1. Acute renal failure on chronic kidney disease stage IV with proteinuria: nonoliguric.  Baseline creatinine of 2.2, GFR of 28 on 10/12 Acute renal failure from ATN, prerenal azotemia, overdiuresis and/or acute cardiorenal syndrome.   Holding furosemide, metolazone   -Acute renal failure dialysis for now.  Patient seen during dialysis.  Tolerating well.   Serum creatinine still remains high at 5.16 patient's family has opted to apply to [place] dialysis.  Discharge planning is in progress. Chair time available for Tuesday 11 AM.  2. Hypotension: with systolic congestive heart failure EF 10-15% and atrial fibrillation.  Holding all diuretics and continue to monitor.  Discontinued carvedilol and tamsulosin  3. Obstructive uropathy: foley catheter placed. Previously was following with Dr. Pilar Jarvis, [facility].  Renal ultrasound from 10/17 showed no hydronephrosis, echogenic renal parenchyma -consider restarting tamsulosin  4.  Small bowel obstruction Seen on CT on October 14.  Transition at small paraumbilical hernia where there is a herniation of a short loop of small bowel.  Conservative treatment.  Clinically improved.   LOS: [place] 10/25/20181:53 PM

## 2017-03-20 NOTE — Progress Notes (Signed)
To Dialysis via bed 

## 2017-03-20 NOTE — Care Management Important Message (Signed)
Important Message  Patient Details  Name: Ruben Plain Sr. MRN: [id number] Date of Birth: Apr 10, 1926   Medicare Important Message Given:  Yes  Signed IM notice given   Katrina Stack, RN 03/20/2017, 2:51 PM

## 2017-03-20 NOTE — Progress Notes (Signed)
PT Cancellation Note  Patient Details Name: Ruben Couts Sr. MRN: [id number] DOB: [birth year]   Cancelled Treatment:    Reason Eval/Treat Not Completed: Patient at procedure or test/unavailable.  Pt currently off unit at dialysis and not available for PT session.  Will re-attempt PT treatment session at a later date/time.  Leitha Bleak, PT 03/20/17, 10:47 AM [phone]

## 2017-03-20 NOTE — Progress Notes (Signed)
ANTICOAGULATION CONSULT NOTE  Pharmacy Consult for Warfarin Dosing  Indication: atrial fibrillation  No Known Allergies  Patient Measurements: Height: 5\' 9"  (175.3 cm) Weight: 153 lb 1.6 oz (69.4 kg) IBW/kg (Calculated) : 70.7  Vital Signs: Temp: 97.5 F (36.4 C) (10/25 1000) Temp Source: Oral (10/25 0815) BP: 119/77 (10/25 1030) Pulse Rate: 97 (10/25 1045)  Labs:  Recent Labs  03/18/17 0427 03/19/17 0612 03/20/17 0341 03/20/17 0910  HGB 13.1  --   --   --   HCT 39.5*  --   --   --   PLT 160  --   --   --   LABPROT  --  16.0* 17.7*  --   INR  --  1.29 1.47  --   CREATININE 5.54*  --   --  5.16*    Estimated Creatinine Clearance: 9.5 mL/min (A) (by C-G formula based on SCr of 5.16 mg/dL (H)).   Assessment: Pharmacy consulted for warfarin dosing in a 83 yo male with CKD, SBO and PMH of A. Fib. Patient was on Eliquis but this has been discontinued due to worsening renal function.  Patient also started on Amiodarone this admission.   Date INR Dose 10/23 None 5 mg 10/24 1.29 5 mg 10/25 1.47   Goal of Therapy:  INR 2-3 Monitor platelets by anticoagulation protocol: Yes   Plan:  Will order warfarin 5 mg PO x1 for tonight. Likely will need reduced dose on discharge.  INRs daily and CBC in AM.   Pharmacy will continue to follow.   Rocky Morel, PharmD, BCPS Clinical Pharmacist 03/20/2017 10:46 AM

## 2017-04-07 DIAGNOSIS — R040 Epistaxis: Secondary | ICD-10-CM | POA: Insufficient documentation

## 2017-04-19 ENCOUNTER — Encounter: Payer: Self-pay | Admitting: Emergency Medicine

## 2017-04-19 ENCOUNTER — Emergency Department: Payer: Medicare Other

## 2017-04-19 ENCOUNTER — Emergency Department
Admission: EM | Admit: 2017-04-19 | Discharge: 2017-04-19 | Disposition: A | Discharge status: Home / Self Care | Payer: Medicare Other | Attending: Emergency Medicine | Admitting: Emergency Medicine

## 2017-04-19 ENCOUNTER — Other Ambulatory Visit: Payer: Self-pay

## 2017-04-19 DIAGNOSIS — R109 Unspecified abdominal pain: Secondary | ICD-10-CM | POA: Insufficient documentation

## 2017-04-19 DIAGNOSIS — Z7901 Long term (current) use of anticoagulants: Secondary | ICD-10-CM | POA: Diagnosis not present

## 2017-04-19 DIAGNOSIS — I130 Hypertensive heart and chronic kidney disease with heart failure and stage 1 through stage 4 chronic kidney disease, or unspecified chronic kidney disease: Secondary | ICD-10-CM | POA: Diagnosis not present

## 2017-04-19 DIAGNOSIS — Z87891 Personal history of nicotine dependence: Secondary | ICD-10-CM | POA: Diagnosis not present

## 2017-04-19 DIAGNOSIS — N183 Chronic kidney disease, stage 3 (moderate): Secondary | ICD-10-CM | POA: Diagnosis not present

## 2017-04-19 DIAGNOSIS — K5900 Constipation, unspecified: Secondary | ICD-10-CM

## 2017-04-19 DIAGNOSIS — Z7982 Long term (current) use of aspirin: Secondary | ICD-10-CM | POA: Insufficient documentation

## 2017-04-19 DIAGNOSIS — Z79899 Other long term (current) drug therapy: Secondary | ICD-10-CM | POA: Insufficient documentation

## 2017-04-19 DIAGNOSIS — I5022 Chronic systolic (congestive) heart failure: Secondary | ICD-10-CM | POA: Diagnosis not present

## 2017-04-19 MED ORDER — MAGNESIUM CITRATE PO SOLN: 1.0000 | Freq: Once | ORAL | 0 refills | Status: AC

## 2017-04-19 MED ORDER — IOPAMIDOL (ISOVUE-300) INJECTION 61%
15.0000 mL | INTRAVENOUS | Status: AC
  Administered 2017-04-19 (×2): 15 mL via ORAL

## 2017-04-19 NOTE — Discharge Instructions (Signed)
Please seek medical attention for any high fevers, chest pain, shortness of breath, change in behavior, persistent vomiting, bloody stool or any other new or concerning symptoms.  

## 2017-04-19 NOTE — ED Notes (Addendum)
Soap suds enema done. Pt tolerated well. Sitting up on bedside commode with wife at side

## 2017-04-19 NOTE — ED Triage Notes (Signed)
Pt reports that he has not had a bowel movement for the last week. He was here a few months back with a bowel obstruction per wife. Wife reports that dialysis has told her to give him Mirlax and stool for the last week with no results.

## 2017-04-19 NOTE — ED Provider Notes (Signed)
[facility] Emergency Department Provider Note   ____________________________________________   I have reviewed the triage vital signs and the nursing notes.   HISTORY  Chief Complaint Constipation   History limited by: Not Limited   HPI Ruben Lindstrom Sr. is a 83 y.o. male who presents to the emergency department today because of concern for constipation.  DURATION:10 days TIMING: constant SEVERITY: has not had any bowel movement CONTEXT: patient states he has not had a bowel movement for the past ten days. Has been passing gas. Did have admission to the hospital last month for bowel obstruction.  MODIFYING FACTORS: has been trying miralax at home without relief ASSOCIATED SYMPTOMS: denies any abdominal pain. Denies any nausea or vomiting. Has had decreased appetite.   Per medical record review patient has a history of hospitalization last month for SBO treated with conservative measures.  Past Medical History:  Diagnosis Date  . Acute pulmonary edema ([place]) 02/26/2013  . Benign prostatic hyperplasia with urinary obstruction 02/24/2012  . BP (high blood pressure) 10/31/2015  . Cataract extraction status of left eye   . Cataract extraction status of right eye   . Cerebrovascular accident (CVA) ([place]) 04/24/2012  . Chronic kidney disease 02/16/2014   stage 2  . Congestive heart failure ([place]) 10/31/2015  . HLD (hyperlipidemia) 10/31/2015  . Hydrocele 02/25/2012  . Unstable angina pectoris ([place]) 02/26/2013  . Urge incontinence of urine 05/30/2014  . Urinary urgency 10/31/2015    Patient Active Problem List   Diagnosis Date Noted  . Palliative care by specialist   . Goals of care, counseling/discussion   . Atrial fibrillation with RVR ([place]) 03/09/2017  . Recurrent umbilical hernia with incarceration   . Small bowel obstruction ([place])   . Acute on chronic systolic CHF (congestive heart failure) ([place]) 08/23/2016  . CKD (chronic kidney disease), stage III ([place])  08/23/2016  . Elevated troponin 08/23/2016  . Thrombocytopenia ([place]) 08/23/2016  . Hyperkalemia 11/06/2015  . Urinary urgency 10/31/2015  . Chronic kidney disease, stage 3 ([place]) 10/31/2015  . Congestive heart failure ([place]) 10/31/2015  . Hyperlipidemia 10/31/2015  . BP (high blood pressure) 10/31/2015  . Cerebrovascular accident (CVA) ([place]) 10/31/2015  . Phimosis 10/31/2015  . 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 Benign fibroma of prostate 05/30/2014  . Urge incontinence of urine 05/30/2014  . Chronic kidney disease, stage IV (severe) ([place]) 02/16/2014  . Acute pulmonary edema ([place]) 02/26/2013  . Unstable angina pectoris ([place]) 02/26/2013  . Hydrocele 02/25/2012  . Benign prostatic hyperplasia with urinary obstruction 02/24/2012    Past Surgical History:  Procedure Laterality Date  . ANKLE FRACTURE SURGERY Right   . CIRCUMCISION N/A 11/13/2015   Procedure: CIRCUMCISION ADULT;  Surgeon: Hollice Espy, MD;  Location: [facility] ORS;  Service: Urology;  Laterality: N/A;  . DIALYSIS/PERMA CATHETER INSERTION N/A 03/17/2017   Procedure: DIALYSIS/PERMA CATHETER INSERTION;  Surgeon: Algernon Huxley, MD;  Location: [place] CV LAB;  Service: Cardiovascular;  Laterality: N/A;  . EYE SURGERY    . HERNIA REPAIR    . HYDROCELE EXCISION Bilateral 11/13/2015   Procedure: HYDROCELECTOMY ADULT;  Surgeon: Hollice Espy, MD;  Location: [facility] ORS;  Service: Urology;  Laterality: Bilateral;  . TONSILLECTOMY  1950's  . TONSILLECTOMY    . UMBILICAL HERNIA REPAIR    . VARICOCELECTOMY  1948  . VASECTOMY      Prior to Admission medications   Medication Sig Start Date End Date Taking? Authorizing Provider  ALPRAZolam Duanne Moron) 0.25 MG tablet Take  0.25 mg by mouth at bedtime as needed for sleep. 08/19/16   [provider]  amiodarone (PACERONE) 200 MG tablet Take 1 tablet (200 mg total) by mouth daily. 03/21/17 05/20/17  Henreitta Leber, MD  apixaban (ELIQUIS) 2.5 MG TABS tablet Take 1  tablet (2.5 mg total) by mouth 2 (two) times daily. 08/25/16   [name], MD  aspirin EC 81 MG EC tablet Take 1 tablet (81 mg total) by mouth daily. 08/25/16   [name], MD  atorvastatin (LIPITOR) 10 MG tablet Take 10 mg by mouth daily.     [provider]  furosemide (LASIX) 40 MG tablet Take 1 tablet (40 mg total) by mouth daily. 03/20/17   Henreitta Leber, MD  KLOR-CON M10 10 MEQ tablet Take 10 mEq by mouth daily. 03/01/17   [provider]  midodrine (PROAMATINE) 2.5 MG tablet Take 1 tablet (2.5 mg total) by mouth 3 (three) times daily with meals. 03/20/17 05/19/17  Henreitta Leber, MD  tamsulosin (FLOMAX) 0.4 MG CAPS capsule Take 0.4 mg by mouth. In am.    [provider]    Allergies Patient has no known allergies.  Family History  Problem Relation Age of Onset  . Kidney cancer Neg Hx   . Prostate cancer Neg Hx     Social History Social History   Tobacco Use  . Smoking status: Former Smoker    Last attempt to quit: 11/03/1954    Years since quitting: 62.5  . Smokeless tobacco: Never Used  Substance Use Topics  . Alcohol use: Yes    Alcohol/week: 4.8 oz    Types: 7 Shots of liquor, 1 Standard drinks or equivalent per week    Comment: 1 shot of gin a day.  . Drug use: No    Review of Systems Constitutional: No fever/chills Eyes: No visual changes. ENT: No sore throat. Cardiovascular: Denies chest pain. Respiratory: Denies shortness of breath. Gastrointestinal: No abdominal pain. Positive for constipation. Decreased appetite.  Genitourinary: Negative for dysuria. Musculoskeletal: Negative for back pain. Skin: Negative for rash. Neurological: Negative for headaches, focal weakness or numbness.  ____________________________________________   PHYSICAL EXAM:  VITAL SIGNS: ED Triage Vitals  Enc Vitals Group     BP 04/19/17 1606 (!) 100/53     Pulse Rate 04/19/17 1606 74     Resp 04/19/17 1606 18     Temp 04/19/17 1606 97.6 F (36.4 C)      Temp Source 04/19/17 1606 Oral     SpO2 04/19/17 1606 99 %     Weight 04/19/17 1607 141 lb 3.2 oz (64 kg)     Height 04/19/17 1607 5\' 9"  (1.753 m)     Head Circumference --      Peak Flow --      Pain Score 04/19/17 1606 3   Constitutional: Alert and oriented. Well appearing and in no distress. Eyes: Conjunctivae are normal.  ENT   Head: Normocephalic and atraumatic.   Nose: No congestion/rhinnorhea.   Mouth/Throat: Mucous membranes are moist.   Neck: No stridor. Hematological/Lymphatic/Immunilogical: No cervical lymphadenopathy. Cardiovascular: Normal rate, regular rhythm.  No murmurs, rubs, or gallops.  Respiratory: Normal respiratory effort without tachypnea nor retractions. Breath sounds are clear and equal bilaterally. No wheezes/rales/rhonchi. Gastrointestinal: Soft and non tender. Not distended. Not tympanitic. No rebound. No guarding.  Genitourinary: Deferred Musculoskeletal: Normal range of motion in all extremities. No lower extremity edema. Neurologic:  Normal speech and language. No gross focal neurologic deficits are appreciated.  Skin:  Skin is warm, dry and intact. No rash noted. Psychiatric: Mood and affect are normal. Speech and behavior are normal. Patient exhibits appropriate insight and judgment.  ____________________________________________    LABS (pertinent positives/negatives)  None  ____________________________________________   EKG  None  ____________________________________________    RADIOLOGY  Abd x-ray Large stool burden  I, Hazelle Woollard, personally viewed and evaluated these images (plain radiographs) as part of my medical decision making.  CT abd/pel No sign of obstruction ____________________________________________   PROCEDURES  Procedures  ____________________________________________   INITIAL IMPRESSION / ASSESSMENT AND PLAN / ED COURSE  Pertinent labs & imaging results that were available during  my care of the patient were reviewed by me and considered in my medical decision making (see chart for details).  Patient presents to the emergency department today because of concerns for constipation.  Given recent history of small bowel obstruction would have some concerns for SBO versus partial SBO versus functional constipation amongst other etiologies.  CT scan was performed which did not show any signs of obstruction.  Patient was given enema here in the emergency department.  States that he did have a bowel movement after this and did feel somewhat better.  Will give prescription for magnesium citrate. Discussed results and plan with patient and family.  ____________________________________________   FINAL CLINICAL IMPRESSION(S) / ED DIAGNOSES  Final diagnoses:  Constipation, unspecified constipation type  Abdominal pain, unspecified abdominal location     Note: This dictation was prepared with Dragon dictation. Any transcriptional errors that result from this process are unintentional     Nance Pear, MD 04/19/17 [phone]

## 2017-04-19 NOTE — ED Notes (Signed)
Doing well, sleeping. Wife feels comfortable to go home

## 2017-06-25 ENCOUNTER — Encounter (INDEPENDENT_AMBULATORY_CARE_PROVIDER_SITE_OTHER): Payer: Medicare Other

## 2017-06-25 ENCOUNTER — Encounter (INDEPENDENT_AMBULATORY_CARE_PROVIDER_SITE_OTHER): Payer: Medicare Other | Admitting: Vascular Surgery

## 2017-06-30 ENCOUNTER — Other Ambulatory Visit (INDEPENDENT_AMBULATORY_CARE_PROVIDER_SITE_OTHER): Payer: Self-pay | Admitting: Vascular Surgery

## 2017-06-30 DIAGNOSIS — N186 End stage renal disease: Secondary | ICD-10-CM

## 2017-07-09 ENCOUNTER — Ambulatory Visit (INDEPENDENT_AMBULATORY_CARE_PROVIDER_SITE_OTHER): Payer: Medicare Other | Admitting: Vascular Surgery

## 2017-07-09 ENCOUNTER — Ambulatory Visit (INDEPENDENT_AMBULATORY_CARE_PROVIDER_SITE_OTHER): Payer: Medicare Other

## 2017-07-09 ENCOUNTER — Encounter (INDEPENDENT_AMBULATORY_CARE_PROVIDER_SITE_OTHER): Payer: Self-pay | Admitting: Vascular Surgery

## 2017-07-09 VITALS — BP 107/57 | HR 74 | Resp 16 | Ht 69.0 in | Wt 145.6 lb

## 2017-07-09 DIAGNOSIS — N186 End stage renal disease: Secondary | ICD-10-CM | POA: Diagnosis not present

## 2017-07-09 DIAGNOSIS — Z992 Dependence on renal dialysis: Secondary | ICD-10-CM | POA: Diagnosis not present

## 2017-07-09 NOTE — Progress Notes (Signed)
Subjective:    Patient ID: Ruben Brunton Sr., male    DOB: [birth year], 84 y.o.   MRN: [id number] Chief Complaint  Patient presents with  . New Patient (Initial Visit)    ref Candiss Norse for access placement   The patient presents to review vascular studies.  Patient was seen and examined with his wife.  The patient was last seen in [facility] and underwent a right IJ PermCath on 03/17/18.  The patient denies any issues with his PermCath functioning.  At this time, the patient and his wife would like to move forward with peritoneal dialysis catheter placement however we are still waiting for cardiac clearance.  The patient underwent bilateral upper extremity vein mapping to assess for a "backup fistula placement".  Due to the small caliber of the patient's veins bilaterally we would be unable to create a fistula.  The patient is right-hand dominant.  The patient will be a candidate for a left brachial axillary graft.  The patient denies any uremic symptoms.  The patient denies any fever, nausea vomiting.   Review of Systems  Constitutional: Negative.   HENT: Negative.   Eyes: Negative.   Respiratory: Negative.   Cardiovascular: Negative.   Gastrointestinal: Negative.   Endocrine: Negative.   Genitourinary: Negative.   Musculoskeletal: Negative.   Skin: Negative.   Allergic/Immunologic: Negative.   Neurological: Negative.   Hematological: Negative.   Psychiatric/Behavioral: Negative.       Objective:   Physical Exam  Constitutional: He is oriented to person, place, and time. He appears well-developed and well-nourished. No distress.  HENT:  Head: Normocephalic and atraumatic.  Eyes: Conjunctivae are normal. Pupils are equal, round, and reactive to light.  Neck: Normal range of motion.  Cardiovascular: Normal rate, regular rhythm, normal heart sounds and intact distal pulses.  Pulses:      Radial pulses are 2+ on the right side, and 2+ on the left side.  Right IJ  PermCath: Intact.  No signs of infection.  Pulmonary/Chest: Effort normal and breath sounds normal. No respiratory distress. He has no wheezes. He has no rales.  On oxygen.  Musculoskeletal: Normal range of motion. He exhibits no edema.  Neurological: He is alert and oriented to person, place, and time.  Skin: Skin is warm and dry. He is not diaphoretic.  Psychiatric: He has a normal mood and affect. His behavior is normal. Judgment and thought content normal.  Vitals reviewed.  BP (!) 107/57 (BP Location: Right Arm)   Pulse 74   Resp 16   Ht 5\' 9"  (1.753 m)   Wt 145 lb 9.6 oz (66 kg)   BMI 21.50 kg/m   Past Medical History:  Diagnosis Date  . Acute pulmonary edema ([place]) 02/26/2013  . Benign prostatic hyperplasia with urinary obstruction 02/24/2012  . BP (high blood pressure) 10/31/2015  . Cataract extraction status of left eye   . Cataract extraction status of right eye   . Cerebrovascular accident (CVA) ([place]) 04/24/2012  . Chronic kidney disease 02/16/2014   stage 2  . Congestive heart failure ([place]) 10/31/2015  . HLD (hyperlipidemia) 10/31/2015  . Hydrocele 02/25/2012  . Unstable angina pectoris ([place]) 02/26/2013  . Urge incontinence of urine 05/30/2014  . Urinary urgency 10/31/2015   Social History   Socioeconomic History  . Marital status: Married    Spouse name: Not on file  . Number of children: Not on file  . Years of education: Not on file  . Highest education level:  Not on file  Social Needs  . Financial resource strain: Not on file  . Food insecurity - worry: Not on file  . Food insecurity - inability: Not on file  . Transportation needs - medical: Not on file  . Transportation needs - non-medical: Not on file  Occupational History  . Not on file  Tobacco Use  . Smoking status: Former Smoker    Last attempt to quit: 11/03/1954    Years since quitting: 62.7  . Smokeless tobacco: Never Used  Substance and Sexual Activity  . Alcohol use: Yes    Alcohol/week: 4.8 oz     Types: 7 Shots of liquor, 1 Standard drinks or equivalent per week    Comment: 1 shot of gin a day.  . Drug use: No  . Sexual activity: Not on file  Other Topics Concern  . Not on file  Social History Narrative  . Not on file   Past Surgical History:  Procedure Laterality Date  . ANKLE FRACTURE SURGERY Right   . CIRCUMCISION N/A 11/13/2015   Procedure: CIRCUMCISION ADULT;  Surgeon: Hollice Espy, MD;  Location: [facility] ORS;  Service: Urology;  Laterality: N/A;  . DIALYSIS/PERMA CATHETER INSERTION N/A 03/17/2017   Procedure: DIALYSIS/PERMA CATHETER INSERTION;  Surgeon: Algernon Huxley, MD;  Location: [place] CV LAB;  Service: Cardiovascular;  Laterality: N/A;  . EYE SURGERY    . HERNIA REPAIR    . HYDROCELE EXCISION Bilateral 11/13/2015   Procedure: HYDROCELECTOMY ADULT;  Surgeon: Hollice Espy, MD;  Location: [facility] ORS;  Service: Urology;  Laterality: Bilateral;  . TONSILLECTOMY  1950's  . TONSILLECTOMY    . UMBILICAL HERNIA REPAIR    . VARICOCELECTOMY  1948  . VASECTOMY     Family History  Problem Relation Age of Onset  . Kidney cancer Neg Hx   . Prostate cancer Neg Hx    No Known Allergies     Assessment & Plan:  The patient presents to review vascular studies.  Patient was seen and examined with his wife.  The patient was last seen in [facility] and underwent a right IJ PermCath on 03/17/18.  The patient denies any issues with his PermCath functioning.  At this time, the patient and his wife would like to move forward with peritoneal dialysis catheter placement however we are still waiting for cardiac clearance.  The patient underwent bilateral upper extremity vein mapping to assess for a "backup fistula placement".  Due to the small caliber of the patient's veins bilaterally we would be unable to create a fistula.  The patient is right-hand dominant.  The patient will be a candidate for a left brachial axillary graft.  The patient denies any uremic  symptoms.  The patient denies any fever, nausea vomiting.  1. ESRD on dialysis ([place]) - Stable The patient and his wife would like to move forward with peritoneal dialysis catheter placement At this time, we are waiting for cardiac clearance The patient is not a candidate for AV fistula creation.  The patient would be a candidate for a left axillary regular graft however we do not put these in prophylactically or as a backup. I had a long discussion with the patient and his wife as to the reasons why I expressed their understanding Once we receive cardiac clearance from the patient's cardiologist we can move forward in scheduling a PD catheter placement insertion Procedure, risks benefits explained to the patient Questions answered The patient and his wife would like to  proceed The patient continued to be maintained by a right IJ PermCath  Current Outpatient Medications on File Prior to Visit  Medication Sig Dispense Refill  . ALPRAZolam (XANAX) 0.25 MG tablet Take 0.25 mg by mouth at bedtime as needed for sleep.  2  . amiodarone (PACERONE) 200 MG tablet Take 200 mg by mouth daily.  11  . aspirin EC 81 MG EC tablet Take 1 tablet (81 mg total) by mouth daily. 30 tablet 2  . atorvastatin (LIPITOR) 10 MG tablet Take 10 mg by mouth daily.     . furosemide (LASIX) 40 MG tablet Take 1 tablet (40 mg total) by mouth daily. 30 tablet 1  . KLOR-CON M10 10 MEQ tablet Take 10 mEq by mouth daily.  11  . midodrine (PROAMATINE) 2.5 MG tablet Take 2.5 mg by mouth.    . multivitamin (RENA-VIT) TABS tablet Take 1 tablet by mouth daily.  11  . tamsulosin (FLOMAX) 0.4 MG CAPS capsule Take 0.4 mg by mouth. In am.    . amiodarone (PACERONE) 200 MG tablet Take 1 tablet (200 mg total) by mouth daily. 30 tablet 1  . apixaban (ELIQUIS) 2.5 MG TABS tablet Take 1 tablet (2.5 mg total) by mouth 2 (two) times daily. (Patient not taking: Reported on 07/09/2017) 60 tablet 2   No current facility-administered medications  on file prior to visit.    There are no Patient Instructions on file for this visit. No Follow-up on file.  Latish Toutant A Lyann Hagstrom, PA-C

## 2017-07-16 ENCOUNTER — Encounter (INDEPENDENT_AMBULATORY_CARE_PROVIDER_SITE_OTHER): Payer: Self-pay

## 2017-07-21 ENCOUNTER — Other Ambulatory Visit (INDEPENDENT_AMBULATORY_CARE_PROVIDER_SITE_OTHER): Payer: Self-pay | Admitting: Vascular Surgery

## 2017-07-25 ENCOUNTER — Other Ambulatory Visit: Payer: Self-pay

## 2017-07-25 ENCOUNTER — Encounter
Admission: RE | Admit: 2017-07-25 | Discharge: 2017-07-25 | Disposition: A | Discharge status: Home / Self Care | Payer: Medicare Other | Source: Ambulatory Visit | Attending: Vascular Surgery | Admitting: Vascular Surgery

## 2017-07-25 DIAGNOSIS — Z01812 Encounter for preprocedural laboratory examination: Secondary | ICD-10-CM | POA: Diagnosis present

## 2017-07-25 HISTORY — DX: Unspecified osteoarthritis, unspecified site: M19.90

## 2017-07-25 HISTORY — DX: Anxiety disorder, unspecified: F41.9

## 2017-07-25 HISTORY — DX: Acute myocardial infarction, unspecified: I21.9

## 2017-07-25 HISTORY — DX: Cardiac arrhythmia, unspecified: I49.9

## 2017-07-25 HISTORY — DX: Dyspnea, unspecified: R06.00

## 2017-07-25 LAB — PROTIME-INR
INR: 1.09
Prothrombin Time: 14 seconds (ref 11.4–15.2)

## 2017-07-25 LAB — CBC WITH DIFFERENTIAL/PLATELET
BASOS ABS: 0.1 10*3/uL (ref 0–0.1)
BASOS PCT: 1 %
EOS PCT: 1 %
Eosinophils Absolute: 0.1 10*3/uL (ref 0–0.7)
HCT: 30.8 % — ABNORMAL LOW (ref 40.0–52.0)
Hemoglobin: 10.3 g/dL — ABNORMAL LOW (ref 13.0–18.0)
Lymphocytes Relative: 11 %
Lymphs Abs: 0.6 10*3/uL — ABNORMAL LOW (ref 1.0–3.6)
MCH: 33.2 pg (ref 26.0–34.0)
MCHC: 33.3 g/dL (ref 32.0–36.0)
MCV: 99.9 fL (ref 80.0–100.0)
MONO ABS: 0.5 10*3/uL (ref 0.2–1.0)
Monocytes Relative: 9 %
NEUTROS ABS: 4.5 10*3/uL (ref 1.4–6.5)
Neutrophils Relative %: 78 %
PLATELETS: 182 10*3/uL (ref 150–440)
RBC: 3.09 MIL/uL — AB (ref 4.40–5.90)
RDW: 15.7 % — AB (ref 11.5–14.5)
WBC: 5.8 10*3/uL (ref 3.8–10.6)

## 2017-07-25 LAB — BASIC METABOLIC PANEL
ANION GAP: 11 (ref 5–15)
BUN: 42 mg/dL — AB (ref 6–20)
CALCIUM: 9.2 mg/dL (ref 8.9–10.3)
CO2: 30 mmol/L (ref 22–32)
Chloride: 99 mmol/L — ABNORMAL LOW (ref 101–111)
Creatinine, Ser: 3.36 mg/dL — ABNORMAL HIGH (ref 0.61–1.24)
GFR calc Af Amer: 17 mL/min — ABNORMAL LOW (ref >60)
GFR, EST NON AFRICAN AMERICAN: 15 mL/min — AB (ref >60)
GLUCOSE: 104 mg/dL — AB (ref 65–99)
POTASSIUM: 4.3 mmol/L (ref 3.5–5.1)
SODIUM: 140 mmol/L (ref 135–145)

## 2017-07-25 LAB — TYPE AND SCREEN
ABO/RH(D): A POS
ANTIBODY SCREEN: NEGATIVE

## 2017-07-25 LAB — APTT: aPTT: 49 seconds — ABNORMAL HIGH (ref 24–36)

## 2017-07-25 MED ORDER — CHLORHEXIDINE GLUCONATE CLOTH 2 % EX PADS
6.0000 | MEDICATED_PAD | Freq: Once | CUTANEOUS | Status: DC
  Filled 2017-07-25: qty 6

## 2017-07-25 NOTE — OR Nursing (Incomplete)
Component Name Value Ref Range  Vent Rate (bpm) 83   PR Interval (msec) 216   QRS Interval (msec) 162   QT Interval (msec) 448   QTc (msec) 526   Other Result Information  This result has an attachment that is not available.  Result Narrative   Sinus rhythm 1st degree AV block with premature supraventricular complexes Right axis deviation Left bundle branch block  When compared with ECG of 03-Nov-2015 10:13, PR interval has increased QRS axis shifted right QT has lengthened I reviewed and concur with this report. Electronically signed IX:BOER MD, KEN ([id number]) on 07/06/2017 10:25:16 PM

## 2017-07-25 NOTE — Patient Instructions (Signed)
Your procedure is scheduled on: Friday, MARCH 8  Report to [facility]  To find out your arrival time please call [phone] between                    1PM - 3PM on Thursday, MARCH 7  Remember: Instructions that are not followed completely may result in serious  medical risk, up to and including death, or upon the discretion of your surgeon  and anesthesiologist your surgery may need to be rescheduled.     _X__ 1. Do not eat food after midnight the night before your procedure.                 No gum chewinlozengers or hard candies.                  You may drink clear liquids up to 2 hours                 before you are scheduled to arrive for your surgery-                   DO not drink clear liquids within 2 hours of the start of your surgery.                  Clear Liquids include:  water, apple juice without pulp, clear carbohydrate                 drink such as Clearfast of Gatorade, Black Coffee or Tea (Do not add                 anything to coffee or tea).  __X__2.  On the morning of surgery brush your teeth with toothpaste and water,                      you may rinse your mouth with mouthwash if you wish.                              Do not swallow any toothpaste of mouthwash.     _X__ 3.  No Alcohol for 24 hours before or after surgery.   _X__ 4.  Do Not Smoke or use e-cigarettes For 24 Hours Prior to Your Surgery.                 Do not use any chewable tobacco products for at least 6 hours prior to                 surgery.  ____  5.  Bring all medications with you on the day of surgery if instructed.   __x__  6.  Notify your doctor if there is any change in your medical condition      (cold, fever, infections).     Do not wear jewelry, make-up, hairpins, clips or nail polish. Do not wear lotions, powders, or perfumes. You may wear deodorant. Do not shave 48 hours prior to surgery. Men may shave face and neck. Do not  bring valuables to the hospital.    [facility] is not responsible for any belongings or valuables.  Contacts, dentures or bridgework may not be worn into surgery. Leave your suitcase in the car. After surgery it may be brought to your room. For patients admitted to the hospital, discharge time is determined by your treatment team.  Patients discharged the day of surgery will not be allowed to drive home.   Please read over the following fact sheets that you were given:   PREPARING FOR SURGERY   ____ Take these medicines the morning of surgery with A SIP OF WATER:    1. AMIODORONE  2.  MIDODRINE  3. LIPITOR  4.   5.  6.  ____ Fleet Enema (as directed)   __X__ Use CHG WIPES as directed  ____ Use inhalers on the day of surgery  __X__CONTINUE YOUR ASPIRIN BUT DO NOT TAKE ON                  DAY OF SURGERY  _X_   Stop Anti-inflammatories NOW!!                   YOU MAY TAKE TYLENOL IF NEEDED   _X___ Stop supplements until after surgery.  CONTINUE RENAVIT AS USUAL  ____ Bring C-Pap to the hospital.   CONTINUE LASIX, KLOR AND TAMSULOSIN BUT NOT ON THE DAY OF [place]

## 2017-07-31 MED ORDER — CEFAZOLIN SODIUM-DEXTROSE 2-4 GM/100ML-% IV SOLN
2.0000 g | INTRAVENOUS | Status: AC
  Administered 2017-08-01: 2 g via INTRAVENOUS

## 2017-08-01 ENCOUNTER — Encounter
Admission: RE | Disposition: A | Discharge status: Home / Self Care | Payer: Self-pay | Source: Ambulatory Visit | Attending: Vascular Surgery

## 2017-08-01 ENCOUNTER — Ambulatory Visit: Payer: Medicare Other

## 2017-08-01 ENCOUNTER — Ambulatory Visit: Payer: Medicare Other | Admitting: Anesthesiology

## 2017-08-01 ENCOUNTER — Other Ambulatory Visit: Payer: Self-pay

## 2017-08-01 ENCOUNTER — Observation Stay
Admission: RE | Admit: 2017-08-01 | Discharge: 2017-08-02 | Disposition: A | Discharge status: Home / Self Care | Payer: Medicare Other | Source: Ambulatory Visit | Attending: Vascular Surgery | Admitting: Vascular Surgery

## 2017-08-01 DIAGNOSIS — I509 Heart failure, unspecified: Secondary | ICD-10-CM | POA: Diagnosis not present

## 2017-08-01 DIAGNOSIS — Z79899 Other long term (current) drug therapy: Secondary | ICD-10-CM | POA: Insufficient documentation

## 2017-08-01 DIAGNOSIS — I132 Hypertensive heart and chronic kidney disease with heart failure and with stage 5 chronic kidney disease, or end stage renal disease: Secondary | ICD-10-CM | POA: Diagnosis present

## 2017-08-01 DIAGNOSIS — E785 Hyperlipidemia, unspecified: Secondary | ICD-10-CM | POA: Diagnosis not present

## 2017-08-01 DIAGNOSIS — N186 End stage renal disease: Secondary | ICD-10-CM | POA: Diagnosis not present

## 2017-08-01 DIAGNOSIS — Z8673 Personal history of transient ischemic attack (TIA), and cerebral infarction without residual deficits: Secondary | ICD-10-CM | POA: Diagnosis not present

## 2017-08-01 DIAGNOSIS — R0682 Tachypnea, not elsewhere classified: Secondary | ICD-10-CM

## 2017-08-01 DIAGNOSIS — N3941 Urge incontinence: Secondary | ICD-10-CM | POA: Insufficient documentation

## 2017-08-01 DIAGNOSIS — Z7982 Long term (current) use of aspirin: Secondary | ICD-10-CM | POA: Diagnosis not present

## 2017-08-01 DIAGNOSIS — Z992 Dependence on renal dialysis: Secondary | ICD-10-CM | POA: Diagnosis not present

## 2017-08-01 DIAGNOSIS — N401 Enlarged prostate with lower urinary tract symptoms: Secondary | ICD-10-CM | POA: Insufficient documentation

## 2017-08-01 HISTORY — PX: CAPD INSERTION: SHX5233

## 2017-08-01 LAB — POCT I-STAT 4, (NA,K, GLUC, HGB,HCT)
Glucose, Bld: 86 mg/dL (ref 65–99)
HEMATOCRIT: 32 % — AB (ref 39.0–52.0)
Hemoglobin: 10.9 g/dL — ABNORMAL LOW (ref 13.0–17.0)
Potassium: 4.6 mmol/L (ref 3.5–5.1)
Sodium: 140 mmol/L (ref 135–145)

## 2017-08-01 LAB — MRSA PCR SCREENING: MRSA BY PCR: NEGATIVE

## 2017-08-01 SURGERY — LAPAROSCOPIC INSERTION CONTINUOUS AMBULATORY PERITONEAL DIALYSIS  (CAPD) CATHETER
Anesthesia: General

## 2017-08-01 MED ORDER — TRAMADOL HCL 50 MG PO TABS: 100.0000 mg | ORAL_TABLET | Freq: Four times a day (QID) | ORAL | Status: DC | PRN

## 2017-08-01 MED ORDER — LIDOCAINE HCL (PF) 2 % IJ SOLN
INTRAMUSCULAR | Status: AC
  Filled 2017-08-01: qty 10

## 2017-08-01 MED ORDER — ACETAMINOPHEN 325 MG PO TABS: 650.0000 mg | ORAL_TABLET | Freq: Four times a day (QID) | ORAL | Status: DC | PRN

## 2017-08-01 MED ORDER — PHENYLEPHRINE HCL 10 MG/ML IJ SOLN
INTRAMUSCULAR | Status: AC
  Filled 2017-08-01: qty 1

## 2017-08-01 MED ORDER — LIDOCAINE HCL (CARDIAC) 20 MG/ML IV SOLN
INTRAVENOUS | Status: DC | PRN
  Administered 2017-08-01: 60 mg via INTRAVENOUS

## 2017-08-01 MED ORDER — CEFAZOLIN SODIUM-DEXTROSE 2-4 GM/100ML-% IV SOLN
INTRAVENOUS | Status: AC
  Filled 2017-08-01: qty 100

## 2017-08-01 MED ORDER — FUROSEMIDE 40 MG PO TABS
40.0000 mg | ORAL_TABLET | Freq: Two times a day (BID) | ORAL | Status: DC
  Administered 2017-08-02: 40 mg via ORAL
  Filled 2017-08-01: qty 1

## 2017-08-01 MED ORDER — HYDROCORTISONE NA SUCCINATE PF 100 MG IJ SOLR
INTRAMUSCULAR | Status: DC | PRN
  Administered 2017-08-01: 100 mg via INTRAVENOUS

## 2017-08-01 MED ORDER — PROPOFOL 10 MG/ML IV BOLUS
INTRAVENOUS | Status: DC | PRN
  Administered 2017-08-01: 60 mg via INTRAVENOUS
  Administered 2017-08-01: 40 mg via INTRAVENOUS

## 2017-08-01 MED ORDER — ATORVASTATIN CALCIUM 10 MG PO TABS
10.0000 mg | ORAL_TABLET | Freq: Every day | ORAL | Status: DC
  Administered 2017-08-02: 10 mg via ORAL
  Filled 2017-08-01: qty 1

## 2017-08-01 MED ORDER — ONDANSETRON HCL 4 MG/2ML IJ SOLN
INTRAMUSCULAR | Status: AC
  Filled 2017-08-01: qty 2

## 2017-08-01 MED ORDER — HYDROCODONE-ACETAMINOPHEN 5-325 MG PO TABS: 1.0000 | ORAL_TABLET | Freq: Four times a day (QID) | ORAL | 0 refills | Status: DC | PRN

## 2017-08-01 MED ORDER — TAMSULOSIN HCL 0.4 MG PO CAPS
0.4000 mg | ORAL_CAPSULE | Freq: Every evening | ORAL | Status: DC
  Administered 2017-08-01: 0.4 mg via ORAL
  Filled 2017-08-01: qty 1

## 2017-08-01 MED ORDER — FUROSEMIDE 10 MG/ML IJ SOLN
80.0000 mg | Freq: Once | INTRAMUSCULAR | Status: AC
Start: 2017-08-01 — End: 2017-08-01
  Administered 2017-08-01: 80 mg via INTRAVENOUS
  Filled 2017-08-01: qty 8

## 2017-08-01 MED ORDER — FENTANYL CITRATE (PF) 100 MCG/2ML IJ SOLN: 25.0000 ug | INTRAMUSCULAR | Status: DC | PRN

## 2017-08-01 MED ORDER — RENA-VITE PO TABS
1.0000 | ORAL_TABLET | Freq: Every day | ORAL | Status: DC
  Administered 2017-08-01 – 2017-08-02 (×2): 1 via ORAL
  Filled 2017-08-01 (×2): qty 1

## 2017-08-01 MED ORDER — FENTANYL CITRATE (PF) 100 MCG/2ML IJ SOLN
INTRAMUSCULAR | Status: DC | PRN
  Administered 2017-08-01: 25 ug via INTRAVENOUS

## 2017-08-01 MED ORDER — EPHEDRINE SULFATE 50 MG/ML IJ SOLN
INTRAMUSCULAR | Status: DC | PRN
  Administered 2017-08-01 (×2): 10 mg via INTRAVENOUS

## 2017-08-01 MED ORDER — EPHEDRINE SULFATE 50 MG/ML IJ SOLN
INTRAMUSCULAR | Status: AC
  Filled 2017-08-01: qty 1

## 2017-08-01 MED ORDER — ONDANSETRON HCL 4 MG/2ML IJ SOLN
INTRAMUSCULAR | Status: DC | PRN
  Administered 2017-08-01: 4 mg via INTRAVENOUS

## 2017-08-01 MED ORDER — PROPOFOL 10 MG/ML IV BOLUS
INTRAVENOUS | Status: AC
Start: 2017-08-01 — End: 2017-08-01
  Filled 2017-08-01: qty 20

## 2017-08-01 MED ORDER — PHENYLEPHRINE HCL 10 MG/ML IJ SOLN
INTRAMUSCULAR | Status: DC | PRN
  Administered 2017-08-01: 100 ug via INTRAVENOUS

## 2017-08-01 MED ORDER — SODIUM CHLORIDE 0.9 % IV SOLN
INTRAVENOUS | Status: DC | PRN
Start: 2017-08-01 — End: 2017-08-01
  Administered 2017-08-01: 08:00:00 via INTRAVENOUS

## 2017-08-01 MED ORDER — SUCCINYLCHOLINE CHLORIDE 20 MG/ML IJ SOLN
INTRAMUSCULAR | Status: AC
  Filled 2017-08-01: qty 1

## 2017-08-01 MED ORDER — SUGAMMADEX SODIUM 200 MG/2ML IV SOLN
INTRAVENOUS | Status: DC | PRN
  Administered 2017-08-01: 200 mg via INTRAVENOUS

## 2017-08-01 MED ORDER — MIDODRINE HCL 5 MG PO TABS
5.0000 mg | ORAL_TABLET | Freq: Three times a day (TID) | ORAL | Status: DC
  Administered 2017-08-02: 5 mg via ORAL
  Filled 2017-08-01 (×2): qty 1

## 2017-08-01 MED ORDER — FAMOTIDINE 20 MG PO TABS
ORAL_TABLET | ORAL | Status: AC
  Administered 2017-08-01: 20 mg via ORAL
  Filled 2017-08-01: qty 1

## 2017-08-01 MED ORDER — SUCCINYLCHOLINE CHLORIDE 20 MG/ML IJ SOLN
INTRAMUSCULAR | Status: DC | PRN
  Administered 2017-08-01: 70 mg via INTRAVENOUS

## 2017-08-01 MED ORDER — SODIUM CHLORIDE 0.9 % IV SOLN
INTRAVENOUS | Status: DC
  Administered 2017-08-01: 07:00:00 via INTRAVENOUS

## 2017-08-01 MED ORDER — SALINE SPRAY 0.65 % NA SOLN
1.0000 | NASAL | Status: DC | PRN
  Filled 2017-08-01: qty 44

## 2017-08-01 MED ORDER — IPRATROPIUM-ALBUTEROL 0.5-2.5 (3) MG/3ML IN SOLN
3.0000 mL | RESPIRATORY_TRACT | Status: DC
  Administered 2017-08-01: 3 mL via RESPIRATORY_TRACT

## 2017-08-01 MED ORDER — ACETAMINOPHEN 650 MG RE SUPP
650.0000 mg | Freq: Four times a day (QID) | RECTAL | Status: DC | PRN
  Filled 2017-08-01: qty 1

## 2017-08-01 MED ORDER — FUROSEMIDE 10 MG/ML IJ SOLN
20.0000 mg | Freq: Once | INTRAMUSCULAR | Status: AC
  Administered 2017-08-01: 20 mg via INTRAVENOUS

## 2017-08-01 MED ORDER — ROCURONIUM BROMIDE 100 MG/10ML IV SOLN
INTRAVENOUS | Status: DC | PRN
  Administered 2017-08-01: 30 mg via INTRAVENOUS

## 2017-08-01 MED ORDER — FUROSEMIDE 10 MG/ML IJ SOLN
INTRAMUSCULAR | Status: AC
  Administered 2017-08-01: 20 mg via INTRAVENOUS
  Filled 2017-08-01: qty 2

## 2017-08-01 MED ORDER — POTASSIUM CHLORIDE CRYS ER 20 MEQ PO TBCR
10.0000 meq | EXTENDED_RELEASE_TABLET | Freq: Every day | ORAL | Status: DC
  Administered 2017-08-01 – 2017-08-02 (×2): 10 meq via ORAL
  Filled 2017-08-01 (×2): qty 1

## 2017-08-01 MED ORDER — BUPIVACAINE-EPINEPHRINE (PF) 0.25% -1:200000 IJ SOLN
INTRAMUSCULAR | Status: AC
  Filled 2017-08-01: qty 30

## 2017-08-01 MED ORDER — HYDROCODONE-ACETAMINOPHEN 5-325 MG PO TABS
ORAL_TABLET | ORAL | Status: AC
  Filled 2017-08-01: qty 1

## 2017-08-01 MED ORDER — TRAMADOL HCL 50 MG PO TABS: 50.0000 mg | ORAL_TABLET | Freq: Four times a day (QID) | ORAL | Status: DC | PRN

## 2017-08-01 MED ORDER — ONDANSETRON HCL 4 MG/2ML IJ SOLN: 4.0000 mg | Freq: Once | INTRAMUSCULAR | Status: DC | PRN

## 2017-08-01 MED ORDER — BUPIVACAINE-EPINEPHRINE (PF) 0.25% -1:200000 IJ SOLN
INTRAMUSCULAR | Status: DC | PRN
Start: 2017-08-01 — End: 2017-08-01
  Administered 2017-08-01: 10 mL

## 2017-08-01 MED ORDER — LACTATED RINGERS IV SOLN: INTRAVENOUS | Status: DC | PRN

## 2017-08-01 MED ORDER — FAMOTIDINE 20 MG PO TABS
20.0000 mg | ORAL_TABLET | Freq: Once | ORAL | Status: AC
  Administered 2017-08-01: 20 mg via ORAL

## 2017-08-01 MED ORDER — FENTANYL CITRATE (PF) 100 MCG/2ML IJ SOLN
INTRAMUSCULAR | Status: AC
  Filled 2017-08-01: qty 2

## 2017-08-01 MED ORDER — ONDANSETRON HCL 4 MG/2ML IJ SOLN: 4.0000 mg | Freq: Four times a day (QID) | INTRAMUSCULAR | Status: DC | PRN

## 2017-08-01 MED ORDER — ROCURONIUM BROMIDE 50 MG/5ML IV SOLN
INTRAVENOUS | Status: AC
  Filled 2017-08-01: qty 1

## 2017-08-01 MED ORDER — AMIODARONE HCL 200 MG PO TABS
200.0000 mg | ORAL_TABLET | Freq: Every day | ORAL | Status: DC
  Administered 2017-08-02: 200 mg via ORAL
  Filled 2017-08-01: qty 1

## 2017-08-01 MED ORDER — HYDROCORTISONE NA SUCCINATE PF 100 MG IJ SOLR
INTRAMUSCULAR | Status: AC
  Filled 2017-08-01: qty 2

## 2017-08-01 MED ORDER — IPRATROPIUM-ALBUTEROL 0.5-2.5 (3) MG/3ML IN SOLN
RESPIRATORY_TRACT | Status: AC
  Administered 2017-08-01: 3 mL via RESPIRATORY_TRACT
  Filled 2017-08-01: qty 3

## 2017-08-01 MED ORDER — HYDROCODONE-ACETAMINOPHEN 5-325 MG PO TABS
1.0000 | ORAL_TABLET | Freq: Four times a day (QID) | ORAL | Status: DC | PRN
  Administered 2017-08-01: 1 via ORAL

## 2017-08-01 SURGICAL SUPPLY — 38 items
ADAPTER BETA CAP QUINTON DIALY (ADAPTER) ×3 IMPLANT
ADAPTER CATH DIALYSIS 18.75 (CATHETERS) ×2 IMPLANT
ADAPTER CATH DIALYSIS 18.75CM (CATHETERS) ×1
BLADE SURG 15 STRL LF DISP TIS (BLADE) ×1 IMPLANT
BLADE SURG 15 STRL SS (BLADE) ×2
CANISTER SUCT 1200ML W/VALVE (MISCELLANEOUS) ×3 IMPLANT
CATH DLYS SWAN NECK 62.5CM (CATHETERS) ×3 IMPLANT
CHLORAPREP W/TINT 26ML (MISCELLANEOUS) ×3 IMPLANT
DERMABOND ADVANCED (GAUZE/BANDAGES/DRESSINGS) ×2
DERMABOND ADVANCED .7 DNX12 (GAUZE/BANDAGES/DRESSINGS) ×1 IMPLANT
ELECT REM PT RETURN 9FT ADLT (ELECTROSURGICAL) ×3
ELECTRODE REM PT RTRN 9FT ADLT (ELECTROSURGICAL) ×1 IMPLANT
GAUZE SPONGE 4X4 12PLY STRL (GAUZE/BANDAGES/DRESSINGS) ×3 IMPLANT
GLOVE BIO SURGEON STRL SZ7 (GLOVE) ×3 IMPLANT
GLOVE INDICATOR 7.5 STRL GRN (GLOVE) ×3 IMPLANT
GLOVE SURG SYN 8.0 (GLOVE) ×3 IMPLANT
GOWN STRL REUS W/ TWL LRG LVL3 (GOWN DISPOSABLE) ×2 IMPLANT
GOWN STRL REUS W/ TWL XL LVL3 (GOWN DISPOSABLE) ×1 IMPLANT
GOWN STRL REUS W/TWL LRG LVL3 (GOWN DISPOSABLE) ×4
GOWN STRL REUS W/TWL XL LVL3 (GOWN DISPOSABLE) ×2
KIT TURNOVER KIT A (KITS) ×3 IMPLANT
LABEL OR SOLS (LABEL) ×3 IMPLANT
MINICAP W/POVIDONE IODINE SOL (MISCELLANEOUS) ×3 IMPLANT
NEEDLE HYPO 25X1 1.5 SAFETY (NEEDLE) ×3 IMPLANT
NEEDLE INSUFFLATION 150MM (ENDOMECHANICALS) ×3 IMPLANT
NS IRRIG 500ML POUR BTL (IV SOLUTION) ×3 IMPLANT
PACK LAP CHOLECYSTECTOMY (MISCELLANEOUS) ×3 IMPLANT
PENCIL ELECTRO HAND CTR (MISCELLANEOUS) ×3 IMPLANT
PORT PERITONEAL WO CUFF ×3 IMPLANT
SET TRANSFER 6 W/TWIST CLAMP 5 (SET/KITS/TRAYS/PACK) ×3 IMPLANT
SLEEVE ENDOPATH XCEL 5M (ENDOMECHANICALS) ×3 IMPLANT
SUT MNCRL AB 4-0 PS2 18 (SUTURE) ×3 IMPLANT
SUT VIC AB 3-0 SH 27 (SUTURE) ×4
SUT VIC AB 3-0 SH 27X BRD (SUTURE) ×2 IMPLANT
SUT VICRYL 0 AB UR-6 (SUTURE) ×6 IMPLANT
SYR 50ML LL SCALE MARK (SYRINGE) ×3 IMPLANT
TROCAR XCEL NON-BLD 5MMX100MML (ENDOMECHANICALS) ×3 IMPLANT
TUBING INSUFFLATION (TUBING) ×3 IMPLANT

## 2017-08-01 NOTE — Anesthesia Post-op Follow-up Note (Signed)
Anesthesia QCDR form completed.        

## 2017-08-01 NOTE — Progress Notes (Signed)
[facility]  Daily Progress Note   Patient being admitted for observation for oozing from PD catheter site.   Marcelle Overlie PA-C 08/01/2017 3:47 PM

## 2017-08-01 NOTE — Progress Notes (Signed)
2nd dose of 20mg  Lasix given per order.  Dr. Kayleen Memos @[facility]  assessing pt.  Pt is on home 02 at 2L.  On 2L now, sats 98. States breathing is "normal for him".  Wife called to join at bedside.  Bubba Camp, RN

## 2017-08-01 NOTE — Progress Notes (Signed)
Per Dr. Margaretmary Eddy, ok to d/c home if pt gets up to move and voids.  No additional orders for lasix given.  Bubba Camp, RN

## 2017-08-01 NOTE — Progress Notes (Signed)
Patient sitting calmly in recliner chair, ate fruit and drank milk. Peritoneal cath site dressing nearly have saturated at this time. Dr. Delana Meyer notified. Patient to be admitted to hospital for further evaluation.

## 2017-08-01 NOTE — Anesthesia Preprocedure Evaluation (Signed)
Anesthesia Evaluation  Patient identified by MRN, date of birth, ID band Patient awake    Reviewed: Allergy & Precautions, NPO status , Patient's Chart, lab work & pertinent test results, reviewed documented beta blocker date and time   Airway Mallampati: II  TM Distance: >3 FB     Dental  (+) Chipped, Missing   Pulmonary shortness of breath, with exertion and Long-Term Oxygen Therapy, former smoker,    Pulmonary exam normal        Cardiovascular hypertension, Pt. on medications + angina + Past MI and +CHF  Normal cardiovascular exam+ dysrhythmias Atrial Fibrillation      Neuro/Psych Anxiety CVA    GI/Hepatic negative GI ROS, Neg liver ROS,   Endo/Other  negative endocrine ROS  Renal/GU ESRFRenal disease     Musculoskeletal  (+) Arthritis , Osteoarthritis,    Abdominal Normal abdominal exam  (+)   Peds  Hematology   Anesthesia Other Findings Denies chest pain or other cardiac symptoms. Active man. Did not have a CVA with weakness, but did have a TIA with eye problems. Has LBBB. Several missing teeth.  Reproductive/Obstetrics                             Anesthesia Physical  Anesthesia Plan  ASA: III  Anesthesia Plan: General   Post-op Pain Management:    Induction: Intravenous  PONV Risk Score and Plan:   Airway Management Planned: Oral ETT  Additional Equipment:   Intra-op Plan:   Post-operative Plan: Extubation in OR  Informed Consent: I have reviewed the patients History and Physical, chart, labs and discussed the procedure including the risks, benefits and alternatives for the proposed anesthesia with the patient or authorized representative who has indicated his/her understanding and acceptance.     Plan Discussed with: CRNA  Anesthesia Plan Comments:         Anesthesia Quick Evaluation

## 2017-08-01 NOTE — Progress Notes (Signed)
Dr. Delana Meyer examined the patient. Performed procedure to stop the bleeding. Patient transported to room 206. Wife at bedside.

## 2017-08-01 NOTE — Progress Notes (Signed)
[facility], [place] 08/01/17  Subjective:   Patient known to our practice from outpatient dialysis He underwent a PD catheter placement today He was about to be discharged when some bleeding was noted in his surgical site He is being admitted for observation His chest x-ray shows congestive heart failure and fluid overload. Nephrology consult has been requested to evaluate for dialysis  Objective:  Vital signs in last 24 hours:  Temp:  [96.6 F (35.9 C)-97.9 F (36.6 C)] 97.9 F (36.6 C) (03/08 1720) Pulse Rate:  [73-84] 80 (03/08 1720) Resp:  [14-35] 14 (03/08 1720) BP: (111-144)/(60-98) 129/60 (03/08 1720) SpO2:  [92 %-100 %] 99 % (03/08 1720)  Weight change:  There were no vitals filed for this visit.  Intake/Output:    Intake/Output Summary (Last 24 hours) at 08/01/2017 1801 Last data filed at 08/01/2017 1723 Gross per 24 hour  Intake 500 ml  Output 380 ml  Net 120 ml     Physical Exam: General:  Sitting up in the bed, eating dinner  HEENT  anicteric, moist oral mucous membranes  Neck  supple   Pulm/lungs 1. mild diffuse basilar crackles  CVS/Heart  regular  Abdomen:   Soft  Extremities:  No peripheral edema  Neurologic:  Alert, oriented  Skin:  No acute rashes  Access:  Right IJ PermCath, new PD catheter       Basic Metabolic Panel:  Recent Labs  Lab 08/01/17 0646  NA 140  K 4.6  GLUCOSE 86     CBC: Recent Labs  Lab 08/01/17 0646  HGB 10.9*  HCT 32.0*      Lab Results  Component Value Date   HEPBSAG Negative 03/17/2017      Microbiology:  No results found for this or any previous visit (from the past 240 hour(s)).  Coagulation Studies: No results for input(s): LABPROT, INR in the last 72 hours.  Urinalysis: No results for input(s): COLORURINE, LABSPEC, PHURINE, GLUCOSEU, HGBUR, BILIRUBINUR, KETONESUR, PROTEINUR, UROBILINOGEN, NITRITE, LEUKOCYTESUR in the last 72 hours.  Invalid input(s): APPERANCEUR     Imaging: X-ray Chest Pa Or Ap  Result Date: 08/01/2017 CLINICAL DATA:  Dialysis catheter placement. EXAM: CHEST 1 VIEW COMPARISON:  03/10/2017 FINDINGS: Right internal jugular catheter in place with its tip in the SVC just above the right atrium. No pneumothorax. Borderline cardiomegaly. Aortic atherosclerosis. Bilateral pleural effusions. Pulmonary venous hypertension with mild edema. Collapse in the lower lobes related to the effusions. IMPRESSION: Right internal jugular catheter tip in the SVC just above the right atrium. No pneumothorax. Congestive heart failure/fluid overload findings. Electronically Signed   By: Nelson Chimes M.D.   On: 08/01/2017 11:26     Medications:    . [START ON 08/02/2017] amiodarone  200 mg Oral Daily  . [START ON 08/02/2017] atorvastatin  10 mg Oral Daily  . furosemide  40 mg Oral BID  . HYDROcodone-acetaminophen      . [START ON 08/02/2017] midodrine  5 mg Oral TID AC  . multivitamin  1 tablet Oral Daily  . potassium chloride  10 mEq Oral Daily  . tamsulosin  0.4 mg Oral QPM   acetaminophen **OR** acetaminophen, ondansetron (ZOFRAN) IV, sodium chloride, traMADol, traMADol  Assessment/ Plan:  84 y.o. male with systolic congestive heart failure EF of 10-15%, hypertension, atrial fibrillation, BPH, cerebrovascular disease,  1.  Mild pulmonary edema 2.  Anemia of chronic kidney disease 3.  End-stage renal disease  Patient is admitted under observation to monitor hemoglobin because  of oozing from surgical site He has mild fluid overload.  Plan: IV Lasix 80 mg x1 May hold oral Lasix dose tonight Reassess in the morning No acute indication for dialysis at present       LOS: 0 Noriah Osgood [facility] 3/8/20196:01 PM  [place], [place]  Note: This note was prepared with Dragon dictation. Any transcription errors are unintentional

## 2017-08-01 NOTE — Progress Notes (Signed)
Patient assisted on a stretcher. Peritoneal site dressing nearly saturated, surrounding skin in abdomen appears to be more swollen. Dr. Delana Meyer notifed to see patient here in post op prior to going to medical floor 2C.

## 2017-08-01 NOTE — Op Note (Signed)
  OPERATIVE NOTE   PROCEDURE: 1. Laparoscopic peritoneal dialysis catheter placement.  PRE-OPERATIVE DIAGNOSIS: 1.  End-stage renal disease 2.  Hypertension  POST-OPERATIVE DIAGNOSIS: Same  SURGEON: Hortencia Pilar  ASSISTANT(S): None  ANESTHESIA: general  ESTIMATED BLOOD LOSS: Minimal   FINDING(S): 1. None  SPECIMEN(S): None  INDICATIONS:  Ruben Chavarin Sr. is a 84 y.o. male who presents with end-stage renal disease. The patient has decided to do peritoneal dialysis for his long-term dialysis. Risks and benefits of placement were discussed and he is agreeable to proceed.  Differences between peritoneal dialysis and hemodialysis were discussed.    DESCRIPTION:  After obtaining full informed written consent, the patient was brought back to the operating room and placed supine upon the operating table. The patient received IV antibiotics prior to induction. After obtaining adequate anesthesia, the abdomen was prepped and draped in the standard fashion.   A small vertical incision was made in the midline just above the umbilicus and the dissection was carried down through the soft tissue to expose the fascia. Two 0 Vicryl sutures were then used to secure the fascia just lateral to the midline on both sides and an 15 blade was used to incise the fascia. The fascia is then elevated with a bone hook Varies needle is introduced and a saline test was performed indicating intraperitoneal location and insufflated of the abdomen with carbon dioxide is performed to 15 mmHg pressure. A 5 mm trocar is then placed again maintaining elevation of the fascia with a bone hook.  A oblique incision is then made in the left lower quadrant to the lateral portion of the rectus muscle the dissection is carried down through the soft tissues and the fascia is exposed. Small incision is made in the fascia with the Bovie and a pursestring suture of 0 Vicryl is placed. Upon initial attempt of placing the  Seldinger needle the adhesions are obstructing the catheter positioning and view of the placement and therefore they must be removed.  Seldinger needle was then inserted under direct visualization and the wire introduced under the view of the camera trocar was then placed and the catheter was advanced into the abdominal cavity over the trocar. Under direct visualization the catheters curled portion was positioned deep into the pelvis just to the right of midline. It was irrigated with heparinized saline. The deep cuff was secured to the fascial pursestring suture. A small counterincision was made in the right upper quadrant of the abdomen and the catheter was brought out this site. The appropriate distal connectors were placed, and I then placed 300 cc of saline through the catheter into the pelvis. The abdomen was desufflated. Immediately, 250 cc of effluent returned through the catheter when the bag was placed to gravity. I took one more look with the camera to ensure that the catheter was in the pelvis and it was. The hasson trocar was then removed. I then closed the incisions with a 2-0 Vicryl in the right lower quadrant incision and subsequently 3-0 Vicryl and 4-0 Monocryl, the other incisions were closed with 3-0 Vicryl and 4-0 Monocryl and placed Dermabond as dressing. Dry dressing was placed around the catheter exit site. The patient was then awakened from anesthesia and taken to the recovery room in stable condition having tolerated the procedure well.   COMPLICATIONS: None  CONDITION: None  Hortencia Pilar 08/01/2017, 8:55 AM

## 2017-08-01 NOTE — Progress Notes (Addendum)
Pt came to floor at 1720. VSS. Pt and wife was oriented to room and safety plan. ABD dsg c/d/i.

## 2017-08-01 NOTE — Consult Note (Addendum)
Reason for Consult: No chief complaint on file.  Referring Physician: Dr.Carroll (anesthesiologist  Ruben Pandy Sr. is an 84 y.o. male.  HPI: 84 year old with history of end-stage renal disease currently getting hemodialysis on Tuesdays and Saturdays is admitted to outpatient surgery and had PD catheter placement today by vascular surgery.  Hospitalist team is consulted after surgery as patient was little short of breath.  During my examination patient denies any chest pain or shortness of breath.  Chest x-ray has revealed mild CHF but patient is saturating 98-100% on chronic 2 L of home oxygen.  Wife at bedside  Past Medical History:  Diagnosis Date  . Acute pulmonary edema ([place]) 02/26/2013  . Anxiety   . Arthritis    little  . Benign prostatic hyperplasia with urinary obstruction 02/24/2012  . BP (high blood pressure) 10/31/2015  . Cataract extraction status of left eye   . Cataract extraction status of right eye   . Cerebrovascular accident (CVA) ([place]) 04/24/2012   affected right eye, limited peripheral vision  . Chronic kidney disease 07/25/2017   stage 3  . Congestive heart failure ([place]) 10/31/2015  . Dyspnea    uses continuous o2  . Dysrhythmia    AF  . HLD (hyperlipidemia) 10/31/2015  . Hydrocele 02/25/2012  . Myocardial infarction ([place])    per patient, small MI  . Unstable angina pectoris ([place]) 02/26/2013  . Urge incontinence of urine 05/30/2014  . Urinary urgency 10/31/2015    Past Surgical History:  Procedure Laterality Date  . ANKLE FRACTURE SURGERY Right 2006   metal in ankle, screws and plate  . CIRCUMCISION N/A 11/13/2015   Procedure: CIRCUMCISION ADULT;  Surgeon: Hollice Espy, MD;  Location: [facility] ORS;  Service: Urology;  Laterality: N/A;  . DIALYSIS/PERMA CATHETER INSERTION N/A 03/17/2017   Procedure: DIALYSIS/PERMA CATHETER INSERTION;  Surgeon: Algernon Huxley, MD;  Location: [facility];  Service: Cardiovascular;  Laterality: N/A;  . EYE SURGERY Bilateral     cataract extractions  . HERNIA REPAIR    . HYDROCELE EXCISION Bilateral 11/13/2015   Procedure: HYDROCELECTOMY ADULT;  Surgeon: Hollice Espy, MD;  Location: [facility] ORS;  Service: Urology;  Laterality: Bilateral;  . TONSILLECTOMY  1950's  . TONSILLECTOMY    . [place]   with mesh  . VARICOCELECTOMY  1948  . VASECTOMY      Family History  Problem Relation Age of Onset  . Kidney cancer Neg Hx   . Prostate cancer Neg Hx     Social History:  reports that he quit smoking about 62 years ago. His smoking use included cigarettes. He has a 20.00 pack-year smoking history. he has never used smokeless tobacco. He reports that he does not drink alcohol or use drugs.  Allergies:  Allergies  Allergen Reactions  . Eliquis [Apixaban] Other (See Comments)    5 hours of nonstop nasal bleeding.  Required OVN stay in [place].  Occurred 04/2017  . Acyclovir And Related     Medications: I have reviewed the patient's current medications.  Results for orders placed or performed during the hospital encounter of 08/01/17 (from the past 48 hour(s))  I-STAT 4, (NA,K, GLUC, HGB,HCT)     Status: Abnormal   Collection Time: 08/01/17  6:46 AM  Result Value Ref Range   Sodium 140 135 - 145 mmol/L   Potassium 4.6 3.5 - 5.1 mmol/L   Glucose, Bld 86 65 - 99 mg/dL   HCT 32.0 (L) 39.0 - 52.0 %  Hemoglobin 10.9 (L) 13.0 - 17.0 g/dL    X-ray Chest Pa Or Ap  Result Date: 08/01/2017 CLINICAL DATA:  Dialysis catheter placement. EXAM: CHEST 1 VIEW COMPARISON:  03/10/2017 FINDINGS: Right internal jugular catheter in place with its tip in the SVC just above the right atrium. No pneumothorax. Borderline cardiomegaly. Aortic atherosclerosis. Bilateral pleural effusions. Pulmonary venous hypertension with mild edema. Collapse in the lower lobes related to the effusions. IMPRESSION: Right internal jugular catheter tip in the SVC just above the right atrium. No pneumothorax. Congestive heart  failure/fluid overload findings. Electronically Signed   By: Nelson Chimes M.D.   On: 08/01/2017 11:26    ROS:  CONSTITUTIONAL: Denies fevers, chills. Denies any fatigue, weakness.  EYES: Denies blurry vision, double vision, eye pain. EARS, NOSE, THROAT: Denies tinnitus, ear pain, hearing loss. RESPIRATORY: Denies cough, wheeze, shortness of breath.  CARDIOVASCULAR: Denies chest pain, palpitations, edema.  GASTROINTESTINAL: Denies nausea, vomiting, diarrhea, abdominal pain. Denies bright red blood per rectum. GENITOURINARY: Denies dysuria, hematuria. ENDOCRINE: Denies nocturia or thyroid problems. HEMATOLOGIC AND LYMPHATIC: Denies easy bruising or bleeding. SKIN: Denies rash or lesion. MUSCULOSKELETAL: Denies pain in neck, back, shoulder, knees, hips or arthritic symptoms.  NEUROLOGIC: Denies paralysis, paresthesias.  PSYCHIATRIC: Denies anxiety or depressive symptoms. Blood pressure 135/62, pulse 81, temperature 97.9 F (36.6 C), temperature source Temporal, resp. rate 18, SpO2 100 %.   PHYSICAL EXAMINATION:  GENERAL: Well-nourished, well-developed , currently in no acute distress.  HEAD: Normocephalic, atraumatic.  EYES: Pupils equal, round, and reactive to light. Extraocular muscles intact. No scleral icterus.  MOUTH: Moist mucosal membranes. Dentition intact. No abscess noted. EARS, NOSE, THROAT: Clear without exudates. No external lesions.  NECK: Supple. No thyromegaly. No nodules. No JVD.  PULMONARY: Clear to auscultation bilaterally without wheezes, rales, or rhonchi. No use of accessory muscles. Good respiratory effort. CHEST: Nontender to palpation.  CARDIOVASCULAR: S1, S2, regular rate and rhythm. No murmurs, rubs, or gallops.  GASTROINTESTINAL: Soft, nontender, nondistended. No masses. Positive bowel sounds. No hepatosplenomegaly. MUSCULOSKELETAL: No swelling, clubbing, edema. Range of motion full in all extremities. NEUROLOGIC: Cranial nerves II-XII intact. No gross focal  neurological deficits. Sensation intact. Reflexes intact. SKIN: No ulcerations, lesions, rash, cyanosis. Skin warm, dry. Turgor intact. PSYCHIATRIC: Mood, affect within normal limits. Patient awake, alert, oriented x 3. Insight and judgment intact.   Assessment/Plan:  #Shortness of breath with crackles secondary to minimal fluid overload Patient is a symptomatic during my examination Saturating 98 -100% on chronic 2 L of home oxygen   discussed with nephrology Dr. Candiss Norse scheduled for outpatient hemodialysis tomorrow March 9 Patient was given IV Lasix postoperatively after PD catheter placement patient has received only 250 mL of normal saline during the procedure   ambulatory Pulse ox on 2 L of oxygen 92% and heart rate was at 78, okay to discharge patient as patient is clinically stable  #End-stage renal disease currently patient is getting hemodialysis on Tuesdays and Saturday but had hemodialysis yesterday  as patient is scheduled to get PD catheter placement today discussed with, patient's primary nephrologist Dr. Candiss Norse, scheduled to get outpatient hemodialysis tomorrow as patient is minimally fluid overloaded   #Essential hypertension Blood pressure is stable resume his home medications  #Hyperlipidemia continue statin    Okay to discharge patient from hospitalist standpoint as patient is ambulating on 2 L of oxygen without  hypoxia   TOTAL TIME TAKING CARE OF THIS PATIENT: 45  minutes.   Note: This dictation was prepared with Dragon dictation along with  smaller phrase technology. Any transcriptional errors that result from this process are unintentional.   @[facility] @ Pager - [phone] 08/01/2017, 1:35 PM

## 2017-08-01 NOTE — Discharge Instructions (Addendum)
Okay for your visiting nurse to change first bandage and apply a new one. If there is some drainage before your visiting nurse is able to change the first bandage you may change the bandage. Please apply a clean dry gauze to the area with some paper tape.  Please do not place any tape or bend the peritoneal dialysis catheter as you may damage the catheter. Patient should ask the visiting nurse for a peritoneal dialysis belt.  This will secure the peritoneal dialysis catheter to your abdomen without the use of tape Dialysis as per your nephrologist's schedule. You may shower as of Monday.  Please keep peritoneal dialysis area clean and dry.   Please do not submerge in water.   Please no strenuous activity for the next 2-3 days to avoid any additional bleeding

## 2017-08-01 NOTE — Transfer of Care (Signed)
Immediate Anesthesia Transfer of Care Note  Patient: Ruben Pandy Sr.  Procedure(s) Performed: LAPAROSCOPIC INSERTION CONTINUOUS AMBULATORY PERITONEAL DIALYSIS  (CAPD) CATHETER (N/A )  Patient Location: PACU  Anesthesia Type:General  Level of Consciousness: sedated  Airway & Oxygen Therapy: Patient Spontanous Breathing and Patient connected to face mask oxygen  Post-op Assessment: Report given to RN and Post -op Vital signs reviewed and stable  Post vital signs: Reviewed and stable  Last Vitals:  Vitals:   08/01/17 0618  BP: 111/60  Pulse: 73  Resp: 20  Temp: (!) 35.9 C  SpO2: 96%    Last Pain:  Vitals:   08/01/17 0618  TempSrc: Tympanic         Complications: No apparent anesthesia complications

## 2017-08-01 NOTE — Progress Notes (Signed)
Hospitalist notified to come eval pt prior to discharge (per Dr. Liliana Cline request). Dr. Margaretmary Eddy at bedside.  Bubba Camp, RN

## 2017-08-01 NOTE — Anesthesia Procedure Notes (Signed)
Procedure Name: Intubation Date/Time: 08/01/2017 8:02 AM Performed by: Justus Memory, CRNA Pre-anesthesia Checklist: Patient identified, Patient being monitored, Timeout performed, Emergency Drugs available and Suction available Patient Re-evaluated:Patient Re-evaluated prior to induction Oxygen Delivery Method: Circle system utilized Preoxygenation: Pre-oxygenation with 100% oxygen Induction Type: IV induction Ventilation: Mask ventilation without difficulty Laryngoscope Size: Mac, 3 and McGraph Grade View: Grade I Tube type: Oral Tube size: 7.0 mm Number of attempts: 1 Airway Equipment and Method: Stylet Placement Confirmation: ETT inserted through vocal cords under direct vision,  positive ETCO2 and breath sounds checked- equal and bilateral Secured at: 20 cm Tube secured with: Tape Dental Injury: Teeth and Oropharynx as per pre-operative assessment  Difficulty Due To: Difficulty was anticipated Future Recommendations: Recommend- induction with short-acting agent, and alternative techniques readily available

## 2017-08-01 NOTE — Progress Notes (Signed)
When patient arrived to post op, had a bleeding spot on his gown about 3" diameter. Peritoneal catheter site dressing was saturated with blood. Dressing removed and applied new 4x4 dressing to the area. When patient started getting dressed to go home, had additional active bleeding at the peritoneal catheter site. Dr. Delana Meyer notified and came to check on patient. Dr. Delana Meyer applied fibrilar and pressure to the area with new dressing.

## 2017-08-01 NOTE — H&P (Signed)
[facility] History & Physical Update  The patient was interviewed and re-examined.  The patient's previous History and Physical has been reviewed and is unchanged.  There is no change in the plan of care. We plan to proceed with the scheduled procedure.  Hortencia Pilar, MD  08/01/2017, 7:30 AM

## 2017-08-01 NOTE — Progress Notes (Signed)
Patient pulled himself up in the recliner chair, noted small amount of blood at bottom corner of the new dressing at the peritoneal catheter site. Assisted with getting dressed and ambulated approx. 30 feet. Pulse ox while ambulating was 92% on 2 liters oxygen, heart rate 78. Hospitalist notified of ambulating stats. Additional new bleeding was noted on the peritoneal catheter site dressing (bottom corner). Drainage spot was marked. Dr. Delana Meyer notified of the new drainage and came to evaluate the patient. Instructed to keep patient another 30 minutes and if no further bleeding, ok to discharge home.

## 2017-08-02 DIAGNOSIS — I132 Hypertensive heart and chronic kidney disease with heart failure and with stage 5 chronic kidney disease, or end stage renal disease: Secondary | ICD-10-CM | POA: Diagnosis not present

## 2017-08-02 LAB — CBC
HEMATOCRIT: 28.1 % — AB (ref 40.0–52.0)
HEMOGLOBIN: 9.6 g/dL — AB (ref 13.0–18.0)
MCH: 34 pg (ref 26.0–34.0)
MCHC: 34.1 g/dL (ref 32.0–36.0)
MCV: 99.7 fL (ref 80.0–100.0)
Platelets: 186 10*3/uL (ref 150–440)
RBC: 2.82 MIL/uL — AB (ref 4.40–5.90)
RDW: 16 % — ABNORMAL HIGH (ref 11.5–14.5)
WBC: 9.6 10*3/uL (ref 3.8–10.6)

## 2017-08-02 LAB — BASIC METABOLIC PANEL
Anion gap: 12 (ref 5–15)
BUN: 35 mg/dL — AB (ref 6–20)
CALCIUM: 9.1 mg/dL (ref 8.9–10.3)
CO2: 26 mmol/L (ref 22–32)
Chloride: 101 mmol/L (ref 101–111)
Creatinine, Ser: 3.02 mg/dL — ABNORMAL HIGH (ref 0.61–1.24)
GFR calc Af Amer: 20 mL/min — ABNORMAL LOW (ref >60)
GFR calc non Af Amer: 17 mL/min — ABNORMAL LOW (ref >60)
GLUCOSE: 153 mg/dL — AB (ref 65–99)
Potassium: 4.6 mmol/L (ref 3.5–5.1)
Sodium: 139 mmol/L (ref 135–145)

## 2017-08-02 NOTE — Progress Notes (Signed)
[facility] NAME: Ruben Hudson    MR#:  [id number]  DATE OF BIRTH:  [birth year]  SUBJECTIVE:   Pt. Observed in hospital overnight due to bleeding near PD cath site. No further bleeding overnight and Hg. Stable.  No complaints presently and wife at bedside.   REVIEW OF SYSTEMS:    Review of Systems  Constitutional: Negative for chills and fever.  HENT: Negative for congestion and tinnitus.   Eyes: Negative for blurred vision and double vision.  Respiratory: Negative for cough, shortness of breath and wheezing.   Cardiovascular: Negative for chest pain, orthopnea and PND.  Gastrointestinal: Negative for abdominal pain, diarrhea, nausea and vomiting.  Genitourinary: Negative for dysuria and hematuria.  Neurological: Negative for dizziness, sensory change and focal weakness.  All other systems reviewed and are negative.   Nutrition: Renal/Carb control Tolerating Diet: Yes Tolerating PT: Ambulatory   DRUG ALLERGIES:   Allergies  Allergen Reactions  . Eliquis [Apixaban] Other (See Comments)    5 hours of nonstop nasal bleeding.  Required OVN stay in [place].  Occurred 04/2017  . Acyclovir And Related     VITALS:  Blood pressure 108/67, pulse 74, temperature 97.7 F (36.5 C), temperature source Oral, resp. rate 18, height 5\' 9"  (1.753 m), weight 64.4 kg (142 lb), SpO2 99 %.  PHYSICAL EXAMINATION:   Physical Exam  GENERAL:  84 y.o.-year-old patient sitting up in bed in no acute distress.  EYES: Pupils equal, round, reactive to light and accommodation. No scleral icterus. Extraocular muscles intact.  HEENT: Head atraumatic, normocephalic. Oropharynx and nasopharynx clear.  NECK:  Supple, no jugular venous distention. No thyroid enlargement, no tenderness.  LUNGS: Normal breath sounds bilaterally, no wheezing, rales, rhonchi. No use of accessory muscles of respiration.  CARDIOVASCULAR: S1, S2 normal. No murmurs, rubs, or  gallops.  ABDOMEN: Soft, nontender, nondistended. Bowel sounds present. No organomegaly or mass. +PD cath in place with dressing on it and no bleeding noted.   EXTREMITIES: No cyanosis, clubbing or edema b/l.    NEUROLOGIC: Cranial nerves II through XII are intact. No focal Motor or sensory deficits b/l.    PSYCHIATRIC: The patient is alert and oriented x 3.  SKIN: No obvious rash, lesion, or ulcer.   Right chest well perm-cath in place.   LABORATORY PANEL:   CBC Recent Labs  Lab 08/02/17 0430  WBC 9.6  HGB 9.6*  HCT 28.1*  PLT 186   ------------------------------------------------------------------------------------------------------------------  Chemistries  Recent Labs  Lab 08/02/17 0430  NA 139  K 4.6  CL 101  CO2 26  GLUCOSE 153*  BUN 35*  CREATININE 3.02*  CALCIUM 9.1   ------------------------------------------------------------------------------------------------------------------  Cardiac Enzymes No results for input(s): TROPONINI in the last 168 hours. ------------------------------------------------------------------------------------------------------------------  RADIOLOGY:  X-ray Chest Pa Or Ap  Result Date: 08/01/2017 CLINICAL DATA:  Dialysis catheter placement. EXAM: CHEST 1 VIEW COMPARISON:  03/10/2017 FINDINGS: Right internal jugular catheter in place with its tip in the SVC just above the right atrium. No pneumothorax. Borderline cardiomegaly. Aortic atherosclerosis. Bilateral pleural effusions. Pulmonary venous hypertension with mild edema. Collapse in the lower lobes related to the effusions. IMPRESSION: Right internal jugular catheter tip in the SVC just above the right atrium. No pneumothorax. Congestive heart failure/fluid overload findings. Electronically Signed   By: Nelson Chimes M.D.   On: 08/01/2017 11:26     ASSESSMENT AND PLAN:   84 year old male with past medical history of end-stage renal disease on  hemodialysis, hypertension,  hyperlipidemia, history of CHF, BPH, history of previous CVA who underwent a peritoneal dialysis catheter placement yesterday and developed some bleeding near the surgical site and therefore was observed overnight in the hospital.  1.  Status post peritoneal dialysis catheter placement- patient had PD cath placement yesterday and developed some bleeding around the surgical site.  Presently his bleeding has stopped, his hemoglobin is stable. -Continue further care as per vascular surgery.  2.  End-stage renal disease on hemodialysis-patient normally gets dialysis on Tuesdays and Saturdays.  There is no acute indication for dialysis today.  Patient got dialysis on Thursday therefore does not need dialysis presently. -Appreciate nephrology consult, continue further care as per them as an outpatient.  3.  History of chronic diastolic CHF-clinically patient is not in congestive heart failure.  Patient was given 1 dose of IV Lasix yesterday postoperatively.  Continue oral Lasix for now.  4.  BPH-no urinary retention.  Continue Flomax.  Next  5.  Chronic hypotension-continue Midodrine  6. Hyperlipidemia - cont. Atorvastatin.   Pt. Is stable to be discharged home from medical standpoint.       All the records are reviewed and case discussed with Care Management/Social Worker. Management plans discussed with the patient, family and they are in agreement.  CODE STATUS: Full code  DVT Prophylaxis: Ambulatory  TOTAL TIME TAKING CARE OF THIS PATIENT: 25 minutes.   POSSIBLE D/C later today.   Henreitta Leber M.D on 08/02/2017 at 10:49 AM  Between 7am to 6pm - Pager - [phone]  After 6pm go to [email or web address] - Proofreader  [facility]  Office  [phone]  CC: Primary care physician; Leonel Ramsay, MD

## 2017-08-02 NOTE — Progress Notes (Signed)
Pt discharged per MD order. Discharge instructions reviewed with pt and wife. Wife reported that she received prescription on the previous day and would have it filled. IV removed. Pt asked to wait for a few minutes until his wife brought the car. Pt agreed but then chose to walk to car.

## 2017-08-02 NOTE — Discharge Summary (Signed)
[facility]    Discharge Summary    Patient ID:  Ruben Rammel Sr. MRN: [id number] DOB/AGE: 1926-03-09 84 y.o.  Admit date: 08/01/2017 Discharge date: 08/02/2017 Date of Surgery: 08/01/2017 Surgeon: Surgeon(s): Schnier, Dolores Lory, MD  Admission Diagnosis: ESRD  Discharge Diagnoses:  ESRD  Secondary Diagnoses: Past Medical History:  Diagnosis Date  . Acute pulmonary edema ([place]) 02/26/2013  . Anxiety   . Arthritis    little  . Benign prostatic hyperplasia with urinary obstruction 02/24/2012  . BP (high blood pressure) 10/31/2015  . Cataract extraction status of left eye   . Cataract extraction status of right eye   . Cerebrovascular accident (CVA) ([place]) 04/24/2012   affected right eye, limited peripheral vision  . Chronic kidney disease 07/25/2017   stage 3  . Congestive heart failure ([place]) 10/31/2015  . Dyspnea    uses continuous o2  . Dysrhythmia    AF  . HLD (hyperlipidemia) 10/31/2015  . Hydrocele 02/25/2012  . Myocardial infarction ([place])    per patient, small MI  . Unstable angina pectoris ([place]) 02/26/2013  . Urge incontinence of urine 05/30/2014  . Urinary urgency 10/31/2015   Procedure(s): LAPAROSCOPIC INSERTION CONTINUOUS AMBULATORY PERITONEAL DIALYSIS  (CAPD) CATHETER  Discharged Condition: good  HPI:  The patient is an 84 year old male with a past medical history of end-stage renal disease currently being maintained via right IJ PermCath for dialysis without issue.  The patient presented to [facility] yesterday and underwent insertion of a peritoneal dialysis catheter as a form of a more permanent way to dialyze.  The patient began to experience some oozing from the peritoneal dialysis catheter tract in the recovery room.  A purse string suture was applied to the tract which stopped the bleeding and the patient was admitted overnight for observation.  The dressing was removed in the morning which was clean and dry.  The tract  was also clean and dry.  There is no swelling or drainage noted to the area.  The patient's tract did not bleed overnight and he was discharged home the following morning.  The patient's night of surgery was unremarkable.  The patient's diet was advanced to regular and his pain was controlled with PO medication.  The patient was kept on bedrest overnight however his ambulation status was increased in the AM and no bleeding from the tract was noted.  The patient was seen by nephrology who recommended no acute need for dialysis at this time.  The patient will follow his current schedule for dialysis.  Hospital Course:  Ruben Rayford Sr. is a 84 y.o. male is S/P:  Procedure(s): LAPAROSCOPIC INSERTION CONTINUOUS AMBULATORY PERITONEAL DIALYSIS  (CAPD) CATHETER  Extubated: POD # 0  Physical exam:  Alert and oriented x2 (baseline), no acute distress distress Right IJ permcath: Intact.  No signs of infection noted. Cardiovascular: Regular rate and rhythm Pulmonary: Clear to auscultation bilaterally Abdomen: Able to palpate the peritoneal dialysis catheter just underneath the skin.  This is stable from the OR.  There is no sign of hematoma or swelling to the area.  The peritoneal dialysis catheter is intact.  The exit tract is clean and dry.  No bleeding is noted.  Monocryl suture left intact. Nylon suture removed.  Dressing clean dry and intact.  New dressing applied.  Post-op wounds clean, dry, intact or healing well  Pt. Ambulating, voiding and taking PO diet without difficulty.  Pt pain controlled with PO pain meds.  Labs as  below  Complications: Postoperative oozing from the peritoneal dialysis catheter site.  Area sutured in recovery room.  Patient observed overnight.  There is no signs of bleeding overnight into this morning.  Consults:  Treatment Team:  Murlean Iba, MD Henreitta Leber, MD  Significant Diagnostic Studies: CBC Lab Results  Component Value Date   WBC 9.6  08/02/2017   HGB 9.6 (L) 08/02/2017   HCT 28.1 (L) 08/02/2017   MCV 99.7 08/02/2017   PLT 186 08/02/2017   BMET    Component Value Date/Time   NA 139 08/02/2017 0430   NA 140 05/30/2012 0540   K 4.6 08/02/2017 0430   K 3.7 05/30/2012 0540   CL 101 08/02/2017 0430   CL 105 05/30/2012 0540   CO2 26 08/02/2017 0430   CO2 28 05/30/2012 0540   GLUCOSE 153 (H) 08/02/2017 0430   GLUCOSE 94 05/30/2012 0540   BUN 35 (H) 08/02/2017 0430   BUN 30 (H) 05/30/2012 0540   CREATININE 3.02 (H) 08/02/2017 0430   CREATININE 1.63 (H) 05/30/2012 0540   CALCIUM 9.1 08/02/2017 0430   CALCIUM 8.6 05/30/2012 0540   GFRNONAA 17 (L) 08/02/2017 0430   GFRNONAA 38 (L) 05/30/2012 0540   GFRAA 20 (L) 08/02/2017 0430   GFRAA 44 (L) 05/30/2012 0540   COAG Lab Results  Component Value Date   INR 1.09 07/25/2017   INR 1.47 03/20/2017   INR 1.29 03/19/2017   Disposition:  Discharge to :Home  Allergies as of 08/02/2017      Reactions   Eliquis [apixaban] Other (See Comments)   5 hours of nonstop nasal bleeding.  Required OVN stay in [place].  Occurred 04/2017   Acyclovir And Related       Medication List    STOP taking these medications   apixaban 2.5 MG Tabs tablet Commonly known as:  ELIQUIS     TAKE these medications   acetaminophen 500 MG tablet Commonly known as:  TYLENOL Take 500 mg by mouth daily as needed for moderate pain or headache.   ALPRAZolam 0.25 MG tablet Commonly known as:  XANAX Take 0.25 mg by mouth at bedtime as needed for sleep.   amiodarone 200 MG tablet Commonly known as:  PACERONE Take 1 tablet (200 mg total) by mouth daily.   aspirin 81 MG EC tablet Take 1 tablet (81 mg total) by mouth daily.   atorvastatin 10 MG tablet Commonly known as:  LIPITOR Take 10 mg by mouth daily.   furosemide 40 MG tablet Commonly known as:  LASIX Take 1 tablet (40 mg total) by mouth daily. What changed:  when to take this   HYDROcodone-acetaminophen 5-325 MG  tablet Commonly known as:  NORCO Take 1-2 tablets by mouth every 6 (six) hours as needed.   KLOR-CON M10 10 MEQ tablet Generic drug:  potassium chloride Take 10 mEq by mouth daily.   midodrine 5 MG tablet Commonly known as:  PROAMATINE Take 5 mg by mouth 3 (three) times daily.   multivitamin Tabs tablet Take 1 tablet by mouth daily.   neomycin-bacitracin-polymyxin ointment Commonly known as:  NEOSPORIN Apply 1 application topically as needed for wound care.   sodium chloride 0.65 % Soln nasal spray Commonly known as:  OCEAN Place 1 spray into both nostrils as needed for congestion.   tamsulosin 0.4 MG Caps capsule Commonly known as:  FLOMAX Take 0.4 mg by mouth every evening.      Verbal and written Discharge instructions given to the  patient. Wound care per Discharge AVS Follow-up Information    Schnier, Dolores Lory, MD Follow up on 08/18/2017.   Specialties:  Vascular Surgery, Cardiology, Radiology, Vascular Surgery Why:  10:45 am Contact information: [place] [address] [phone]          Signed: Sela Hua, PA-C  08/02/2017, 11:30 AM

## 2017-08-02 NOTE — Progress Notes (Signed)
[facility], [place] 08/02/17  Subjective:   Patient known to our practice from outpatient dialysis He underwent a PD catheter placement today He was about to be discharged when some bleeding was noted in his surgical site He is being admitted for observation His chest x-ray shows congestive heart failure and fluid overload. Nephrology consult has been requested to evaluate for dialysis  Objective:  Vital signs in last 24 hours:  Temp:  [97.2 F (36.2 C)-98 F (36.7 C)] 97.7 F (36.5 C) (03/09 0418) Pulse Rate:  [74-86] 74 (03/09 0418) Resp:  [14-35] 18 (03/09 0418) BP: (108-144)/(51-77) 108/67 (03/09 0418) SpO2:  [92 %-100 %] 99 % (03/09 0418) Weight:  [60.9 kg (134 lb 4.8 oz)-64.4 kg (142 lb)] 64.4 kg (142 lb) (03/09 0418)  Weight change:  Filed Weights   08/01/17 1800 08/02/17 0418  Weight: 60.9 kg (134 lb 4.8 oz) 64.4 kg (142 lb)    Intake/Output:    Intake/Output Summary (Last 24 hours) at 08/02/2017 0951 Last data filed at 08/02/2017 0935 Gross per 24 hour  Intake 850 ml  Output 925 ml  Net -75 ml     Physical Exam: General:  Sitting up in the bed, eating dinner  HEENT  anicteric, moist oral mucous membranes  Neck  supple   Pulm/lungs 1. mild diffuse basilar crackles  CVS/Heart  regular  Abdomen:   Soft  Extremities:  No peripheral edema  Neurologic:  Alert, oriented  Skin:  No acute rashes  Access:  Right IJ PermCath, new PD catheter       Basic Metabolic Panel:  Recent Labs  Lab 08/01/17 0646 08/02/17 0430  NA 140 139  K 4.6 4.6  CL  --  101  CO2  --  26  GLUCOSE 86 153*  BUN  --  35*  CREATININE  --  3.02*  CALCIUM  --  9.1     CBC: Recent Labs  Lab 08/01/17 0646 08/02/17 0430  WBC  --  9.6  HGB 10.9* 9.6*  HCT 32.0* 28.1*  MCV  --  99.7  PLT  --  186      Lab Results  Component Value Date   HEPBSAG Negative 03/17/2017      Microbiology:  Recent Results (from the past 240 hour(s))  MRSA  PCR Screening     Status: None   Collection Time: 08/01/17  6:48 PM  Result Value Ref Range Status   MRSA by PCR NEGATIVE NEGATIVE Final    Comment:        The GeneXpert MRSA Assay (FDA approved for NASAL specimens only), is one component of a comprehensive MRSA colonization surveillance program. It is not intended to diagnose MRSA infection nor to guide or monitor treatment for MRSA infections. Performed at [facility], [address] [place], [place] [address]     Coagulation Studies: No results for input(s): LABPROT, INR in the last 72 hours.  Urinalysis: No results for input(s): COLORURINE, LABSPEC, PHURINE, GLUCOSEU, HGBUR, BILIRUBINUR, KETONESUR, PROTEINUR, UROBILINOGEN, NITRITE, LEUKOCYTESUR in the last 72 hours.  Invalid input(s): APPERANCEUR    Imaging: X-ray Chest Pa Or Ap  Result Date: 08/01/2017 CLINICAL DATA:  Dialysis catheter placement. EXAM: CHEST 1 VIEW COMPARISON:  03/10/2017 FINDINGS: Right internal jugular catheter in place with its tip in the SVC just above the right atrium. No pneumothorax. Borderline cardiomegaly. Aortic atherosclerosis. Bilateral pleural effusions. Pulmonary venous hypertension with mild edema. Collapse in the lower lobes related to the effusions. IMPRESSION:  Right internal jugular catheter tip in the SVC just above the right atrium. No pneumothorax. Congestive heart failure/fluid overload findings. Electronically Signed   By: Nelson Chimes M.D.   On: 08/01/2017 11:26     Medications:    . amiodarone  200 mg Oral Daily  . atorvastatin  10 mg Oral Daily  . furosemide  40 mg Oral BID  . midodrine  5 mg Oral TID AC  . multivitamin  1 tablet Oral Daily  . potassium chloride  10 mEq Oral Daily  . tamsulosin  0.4 mg Oral QPM   acetaminophen **OR** acetaminophen, ondansetron (ZOFRAN) IV, sodium chloride, traMADol, traMADol  Assessment/ Plan:  84 y.o. male with systolic congestive heart failure EF of 10-15%, hypertension,  atrial fibrillation, BPH, cerebrovascular disease,  1.  Mild pulmonary edema 2.  Anemia of chronic kidney disease 3.  End-stage renal disease  Patient is admitted under observation to monitor hemoglobin because of oozing from surgical site He has mild fluid overload.  Plan: IV Lasix 80 mg x1 May hold oral Lasix dose tonight Reassess in the morning No acute indication for dialysis at present       LOS: 0 Ameri Cahoon Candiss Norse 3/9/20199:51 AM  [place], [place]  Note: This note was prepared with Dragon dictation. Any transcription errors are unintentional

## 2017-08-02 NOTE — Care Management Note (Signed)
Case Management Note  Patient Details  Name: Ruben Danowski Sr. MRN: [id number] Date of Birth: 07-23-1925  Subjective/Objective:   A request for a RW to be delivered to Ruben Hudson room was texted to [place] today.                  Action/Plan:   Expected Discharge Date:  08/02/17               Expected Discharge Plan:     In-House Referral:     Discharge planning Services     Post Acute Care Choice:    Choice offered to:     DME Arranged:    DME Agency:     HH Arranged:    HH Agency:     Status of Service:     If discussed at [employer], dates discussed:    Additional Comments:  Armari Fussell A, RN 08/02/2017, 11:50 AM

## 2017-08-03 ENCOUNTER — Encounter: Payer: Self-pay | Admitting: Vascular Surgery

## 2017-08-04 NOTE — OR Nursing (Signed)
Spoke with Ruben Hudson/patient experience [phone] re spouse's concern that patient was not discharged via wheelchair.  Raquel Sarna advises she will call spouse to discuss.

## 2017-08-04 NOTE — Anesthesia Postprocedure Evaluation (Signed)
Anesthesia Post Note  Patient: Ruben Plagge Sr.  Procedure(s) Performed: LAPAROSCOPIC INSERTION CONTINUOUS AMBULATORY PERITONEAL DIALYSIS  (CAPD) CATHETER (N/A )  Patient location during evaluation: PACU Anesthesia Type: General Level of consciousness: awake and alert and oriented Pain management: pain level controlled Vital Signs Assessment: post-procedure vital signs reviewed and stable Respiratory status: spontaneous breathing Cardiovascular status: blood pressure returned to baseline Anesthetic complications: no Comments: Some overload noticed in PACU.  Patient was diuresed with resolution of crackles and + output of urine.  Hospitalist consulted with chest Xray showing some vascular congestion which was somewhat chronic and agree that the patient was OK for discharge.  It happened that the patient was kept overnight for a different reason whic involved bleeding around the PD catheter which resolved and the patient was discharged the next day.     Last Vitals:  Vitals:   08/01/17 2044 08/02/17 0418  BP: (!) 121/51 108/67  Pulse: 86 74  Resp: 18 18  Temp: 36.4 C 36.5 C  SpO2: 100% 99%    Last Pain:  Vitals:   08/04/17 0956  TempSrc:   PainSc: 0-No pain                 [name]

## 2017-08-18 ENCOUNTER — Ambulatory Visit (INDEPENDENT_AMBULATORY_CARE_PROVIDER_SITE_OTHER): Payer: Medicare Other | Admitting: Vascular Surgery

## 2017-08-18 ENCOUNTER — Encounter (INDEPENDENT_AMBULATORY_CARE_PROVIDER_SITE_OTHER): Payer: Self-pay | Admitting: Vascular Surgery

## 2017-08-18 VITALS — BP 107/66 | HR 80 | Resp 16 | Ht 69.0 in | Wt 140.0 lb

## 2017-08-18 DIAGNOSIS — N186 End stage renal disease: Secondary | ICD-10-CM

## 2017-08-18 DIAGNOSIS — Z992 Dependence on renal dialysis: Secondary | ICD-10-CM

## 2017-08-18 NOTE — Progress Notes (Signed)
Patient ID: Ruben Fralix Sr., male   DOB: 01/10/1926, 84 y.o.   MRN: [id number]  Chief Complaint  Patient presents with  . Follow-up    [facility] 2wk     HPI Ruben Hudson. is a 84 y.o. male. The patient returns to the office for followup of their dialysis access. The function of the access has been stable.   PROCEDURE 07/30/2017: 1. Laparoscopic peritoneal dialysis catheter placement.  The patient denies redness or swelling at the access site. The patient denies fever or chills at home or while on dialysis.  The patient denies amaurosis fugax or recent TIA symptoms. There are no recent neurological changes noted. The patient denies claudication symptoms or rest pain symptoms. The patient denies history of DVT, PE or superficial thrombophlebitis. The patient denies recent episodes of angina or shortness of breath.    Past Medical History:  Diagnosis Date  . Acute pulmonary edema ([place]) 02/26/2013  . Anxiety   . Arthritis    little  . Benign prostatic hyperplasia with urinary obstruction 02/24/2012  . BP (high blood pressure) 10/31/2015  . Cataract extraction status of left eye   . Cataract extraction status of right eye   . Cerebrovascular accident (CVA) ([place]) 04/24/2012   affected right eye, limited peripheral vision  . Chronic kidney disease 07/25/2017   stage 3  . Congestive heart failure ([place]) 10/31/2015  . Dyspnea    uses continuous o2  . Dysrhythmia    AF  . HLD (hyperlipidemia) 10/31/2015  . Hydrocele 02/25/2012  . Myocardial infarction ([place])    per patient, small MI  . Unstable angina pectoris ([place]) 02/26/2013  . Urge incontinence of urine 05/30/2014  . Urinary urgency 10/31/2015    Past Surgical History:  Procedure Laterality Date  . ANKLE FRACTURE SURGERY Right 2006   metal in ankle, screws and plate  . CAPD INSERTION N/A 08/01/2017   Procedure: LAPAROSCOPIC INSERTION CONTINUOUS AMBULATORY PERITONEAL DIALYSIS  (CAPD) CATHETER;  Surgeon: Katha Cabal, MD;   Location: [facility] ORS;  Service: Vascular;  Laterality: N/A;  . CIRCUMCISION N/A 11/13/2015   Procedure: CIRCUMCISION ADULT;  Surgeon: Hollice Espy, MD;  Location: [facility] ORS;  Service: Urology;  Laterality: N/A;  . DIALYSIS/PERMA CATHETER INSERTION N/A 03/17/2017   Procedure: DIALYSIS/PERMA CATHETER INSERTION;  Surgeon: Algernon Huxley, MD;  Location: [place] [facility];  Service: Cardiovascular;  Laterality: N/A;  . EYE SURGERY Bilateral    cataract extractions  . HERNIA REPAIR    . HYDROCELE EXCISION Bilateral 11/13/2015   Procedure: HYDROCELECTOMY ADULT;  Surgeon: Hollice Espy, MD;  Location: [facility] ORS;  Service: Urology;  Laterality: Bilateral;  . TONSILLECTOMY  1950's  . TONSILLECTOMY    . [place]   with mesh  . VARICOCELECTOMY  1948  . VASECTOMY        Allergies  Allergen Reactions  . Eliquis [Apixaban] Other (See Comments)    5 hours of nonstop nasal bleeding.  Required OVN stay in [place].  Occurred 04/2017  . Acyclovir And Related     Current Outpatient Medications  Medication Sig Dispense Refill  . acetaminophen (TYLENOL) 500 MG tablet Take 500 mg by mouth daily as needed for moderate pain or headache.    . ALPRAZolam (XANAX) 0.25 MG tablet Take 0.25 mg by mouth at bedtime as needed for sleep.  2  . aspirin EC 81 MG EC tablet Take 1 tablet (81 mg total) by mouth daily. 30 tablet 2  .  atorvastatin (LIPITOR) 10 MG tablet Take 10 mg by mouth daily.     . furosemide (LASIX) 40 MG tablet Take 1 tablet (40 mg total) by mouth daily. (Patient taking differently: Take 40 mg by mouth 2 (two) times daily. ) 30 tablet 1  . KLOR-CON M10 10 MEQ tablet Take 10 mEq by mouth daily.  11  . midodrine (PROAMATINE) 5 MG tablet Take 5 mg by mouth 3 (three) times daily.    . multivitamin (RENA-VIT) TABS tablet Take 1 tablet by mouth daily.  11  . neomycin-bacitracin-polymyxin (NEOSPORIN) ointment Apply 1 application topically as needed for wound care.    . sodium  chloride (OCEAN) 0.65 % SOLN nasal spray Place 1 spray into both nostrils as needed for congestion.    . tamsulosin (FLOMAX) 0.4 MG CAPS capsule Take 0.4 mg by mouth every evening.     Marland Kitchen amiodarone (PACERONE) 200 MG tablet Take 1 tablet (200 mg total) by mouth daily. 30 tablet 1  . HYDROcodone-acetaminophen (NORCO) 5-325 MG tablet Take 1-2 tablets by mouth every 6 (six) hours as needed. (Patient not taking: Reported on 08/18/2017) 30 tablet 0   No current facility-administered medications for this visit.         Physical Exam BP 107/66 (BP Location: Left Arm)   Pulse 80   Resp 16   Ht 5\' 9"  (1.753 m)   Wt 140 lb (63.5 kg)   BMI 20.67 kg/m  Gen:  WD/WN, NAD Skin: incision C/D/I.  Catheter is intact     Assessment/Plan:  1. ESRD on dialysis [facility]) Recommend:  The patient is doing well and currently has adequate dialysis access. The patient's dialysis center is not reporting any access issues.  The patient will follow-up with me in the office PRN   Hortencia Pilar 08/18/2017, 11:18 AM   This note was created with Dragon medical transcription system.  Any errors from dictation are unintentional.

## 2017-09-16 ENCOUNTER — Other Ambulatory Visit (INDEPENDENT_AMBULATORY_CARE_PROVIDER_SITE_OTHER): Payer: Self-pay | Admitting: Vascular Surgery

## 2017-09-18 ENCOUNTER — Encounter: Payer: Self-pay | Admitting: Vascular Surgery

## 2017-09-18 ENCOUNTER — Ambulatory Visit
Admission: RE | Admit: 2017-09-18 | Discharge: 2017-09-18 | Disposition: A | Discharge status: Home / Self Care | Payer: Medicare Other | Source: Ambulatory Visit | Attending: Vascular Surgery | Admitting: Vascular Surgery

## 2017-09-18 ENCOUNTER — Encounter
Admission: RE | Disposition: A | Discharge status: Home / Self Care | Payer: Self-pay | Source: Ambulatory Visit | Attending: Vascular Surgery

## 2017-09-18 ENCOUNTER — Telehealth (INDEPENDENT_AMBULATORY_CARE_PROVIDER_SITE_OTHER): Payer: Self-pay | Admitting: Vascular Surgery

## 2017-09-18 DIAGNOSIS — M1990 Unspecified osteoarthritis, unspecified site: Secondary | ICD-10-CM | POA: Diagnosis not present

## 2017-09-18 DIAGNOSIS — I10 Essential (primary) hypertension: Secondary | ICD-10-CM | POA: Diagnosis not present

## 2017-09-18 DIAGNOSIS — Z87891 Personal history of nicotine dependence: Secondary | ICD-10-CM | POA: Diagnosis not present

## 2017-09-18 DIAGNOSIS — Z9841 Cataract extraction status, right eye: Secondary | ICD-10-CM | POA: Diagnosis not present

## 2017-09-18 DIAGNOSIS — I509 Heart failure, unspecified: Secondary | ICD-10-CM | POA: Insufficient documentation

## 2017-09-18 DIAGNOSIS — I4891 Unspecified atrial fibrillation: Secondary | ICD-10-CM | POA: Diagnosis not present

## 2017-09-18 DIAGNOSIS — Z992 Dependence on renal dialysis: Secondary | ICD-10-CM | POA: Diagnosis not present

## 2017-09-18 DIAGNOSIS — I2510 Atherosclerotic heart disease of native coronary artery without angina pectoris: Secondary | ICD-10-CM | POA: Insufficient documentation

## 2017-09-18 DIAGNOSIS — Z9852 Vasectomy status: Secondary | ICD-10-CM | POA: Diagnosis not present

## 2017-09-18 DIAGNOSIS — N186 End stage renal disease: Secondary | ICD-10-CM | POA: Diagnosis not present

## 2017-09-18 DIAGNOSIS — Z888 Allergy status to other drugs, medicaments and biological substances status: Secondary | ICD-10-CM | POA: Diagnosis not present

## 2017-09-18 DIAGNOSIS — E785 Hyperlipidemia, unspecified: Secondary | ICD-10-CM | POA: Insufficient documentation

## 2017-09-18 DIAGNOSIS — Z9889 Other specified postprocedural states: Secondary | ICD-10-CM | POA: Insufficient documentation

## 2017-09-18 DIAGNOSIS — I132 Hypertensive heart and chronic kidney disease with heart failure and with stage 5 chronic kidney disease, or end stage renal disease: Secondary | ICD-10-CM | POA: Insufficient documentation

## 2017-09-18 DIAGNOSIS — I252 Old myocardial infarction: Secondary | ICD-10-CM | POA: Diagnosis not present

## 2017-09-18 DIAGNOSIS — Z8673 Personal history of transient ischemic attack (TIA), and cerebral infarction without residual deficits: Secondary | ICD-10-CM | POA: Insufficient documentation

## 2017-09-18 DIAGNOSIS — Z9842 Cataract extraction status, left eye: Secondary | ICD-10-CM | POA: Insufficient documentation

## 2017-09-18 DIAGNOSIS — Z9981 Dependence on supplemental oxygen: Secondary | ICD-10-CM | POA: Insufficient documentation

## 2017-09-18 DIAGNOSIS — Z452 Encounter for adjustment and management of vascular access device: Secondary | ICD-10-CM | POA: Insufficient documentation

## 2017-09-18 HISTORY — PX: DIALYSIS/PERMA CATHETER REMOVAL: CATH118289

## 2017-09-18 SURGERY — DIALYSIS/PERMA CATHETER REMOVAL
Anesthesia: LOCAL

## 2017-09-18 SURGICAL SUPPLY — 2 items
FORCEPS HALSTEAD CVD 5IN STRL (INSTRUMENTS) ×2 IMPLANT
TRAY LACERAT/PLASTIC (MISCELLANEOUS) ×2 IMPLANT

## 2017-09-18 NOTE — Telephone Encounter (Signed)
Patient wife called saying that her husband has perm cath removal this morning. Said he is bleeding and the bandage if full of blood and that shirt is all wet from the blood, that the blood is bubbling up. She can be reached at [phone]

## 2017-09-18 NOTE — Telephone Encounter (Signed)
Patient was inform with all information that was advised JD and stated she feel comfortable to changing the dressing and also applying the pressure to stop the bleeding

## 2017-09-18 NOTE — Op Note (Signed)
Operative Note     Preoperative diagnosis:   1. ESRD with functional permanent access  Postoperative diagnosis:  1. ESRD with functional permanent access  Procedure:  Removal of right jugular Permcath  Surgeon:  Leotis Pain, MD  Anesthesia:  Local  EBL:  Minimal  Indication for the Procedure:  The patient has a functional permanent dialysis access and no longer needs their permcath.  This can be removed.  Risks and benefits are discussed and informed consent is obtained.  Description of the Procedure:  The patient's right neck, chest and existing catheter were sterilely prepped and draped. The area around the catheter was anesthetized copiously with 1% lidocaine. The catheter was dissected out with curved hemostats until the cuff was freed from the surrounding fibrous sheath. The fiber sheath was transected, and the catheter was then removed in its entirety using gentle traction. Pressure was held and sterile dressings were placed. The patient tolerated the procedure well and was taken to the recovery room in stable condition.     Leotis Pain  09/18/2017, 12:30 PM This note was created with Dragon Medical transcription system. Any errors in dictation are purely unintentional.

## 2017-09-18 NOTE — H&P (Signed)
[facility] Admission History & Physical  MRN : [id number]  Ruben Rickel Sr. is a 84 y.o. (Nov 25, 1925) male who presents with chief complaint of No chief complaint on file. Marland Kitchen  History of Present Illness: I am asked to evaluate the patient by the dialysis center. The patient was sent here because they have a tunneled catheter that is not being used and a functioning PD catheter.  The patient reports they're not been any problems with any of their dialysis runs. They are reporting good flows with good parameters at dialysis.  Patient denies pain or tenderness overlying the access.  There is no pain with dialysis.  The patient denies hand pain or finger pain consistent with steal syndrome.  No fevers or chills while on dialysis.   No current facility-administered medications for this encounter.     Past Medical History:  Diagnosis Date  . Acute pulmonary edema ([place]) 02/26/2013  . Anxiety   . Arthritis    little  . Benign prostatic hyperplasia with urinary obstruction 02/24/2012  . BP (high blood pressure) 10/31/2015  . Cataract extraction status of left eye   . Cataract extraction status of right eye   . Cerebrovascular accident (CVA) ([place]) 04/24/2012   affected right eye, limited peripheral vision  . Chronic kidney disease 07/25/2017   stage 3  . Congestive heart failure ([place]) 10/31/2015  . Dyspnea    uses continuous o2  . Dysrhythmia    AF  . HLD (hyperlipidemia) 10/31/2015  . Hydrocele 02/25/2012  . Myocardial infarction ([place])    per patient, small MI  . Unstable angina pectoris ([place]) 02/26/2013  . Urge incontinence of urine 05/30/2014  . Urinary urgency 10/31/2015    Past Surgical History:  Procedure Laterality Date  . ANKLE FRACTURE SURGERY Right 2006   metal in ankle, screws and plate  . CAPD INSERTION N/A 08/01/2017   Procedure: LAPAROSCOPIC INSERTION CONTINUOUS AMBULATORY PERITONEAL DIALYSIS  (CAPD) CATHETER;  Surgeon: Katha Cabal, MD;   Location: [facility] ORS;  Service: Vascular;  Laterality: N/A;  . CIRCUMCISION N/A 11/13/2015   Procedure: CIRCUMCISION ADULT;  Surgeon: Hollice Espy, MD;  Location: [facility] ORS;  Service: Urology;  Laterality: N/A;  . DIALYSIS/PERMA CATHETER INSERTION N/A 03/17/2017   Procedure: DIALYSIS/PERMA CATHETER INSERTION;  Surgeon: Algernon Huxley, MD;  Location: [place] [facility];  Service: Cardiovascular;  Laterality: N/A;  . EYE SURGERY Bilateral    cataract extractions  . HERNIA REPAIR    . HYDROCELE EXCISION Bilateral 11/13/2015   Procedure: HYDROCELECTOMY ADULT;  Surgeon: Hollice Espy, MD;  Location: [facility] ORS;  Service: Urology;  Laterality: Bilateral;  . TONSILLECTOMY  1950's  . TONSILLECTOMY    . [place]   with mesh  . VARICOCELECTOMY  1948  . VASECTOMY      Social History Social History   Tobacco Use  . Smoking status: Former Smoker    Packs/day: 1.00    Years: 20.00    Pack years: 20.00    Types: Cigarettes    Last attempt to quit: 11/03/1954    Years since quitting: 62.9  . Smokeless tobacco: Never Used  . Tobacco comment: started smoking at 84 years old  Substance Use Topics  . Alcohol use: No    Alcohol/week: 4.8 oz    Types: 7 Shots of liquor, 1 Standard drinks or equivalent per week    Frequency: Never    Comment: 1 shot of gin a day.no gin x 1 year  since dialysis  . Drug use: No    Family History Family History  Problem Relation Age of Onset  . Kidney cancer Neg Hx   . Prostate cancer Neg Hx     No family history of bleeding or clotting disorders, autoimmune disease or porphyria  Allergies  Allergen Reactions  . Eliquis [Apixaban] Other (See Comments)    5 hours of nonstop nasal bleeding.  Required OVN stay in [place].  Occurred 04/2017  . Acyclovir And Related      REVIEW OF SYSTEMS (Negative unless checked)  Constitutional: [] Weight loss  [] Fever  [] Chills Cardiac: [x] Chest pain   [] Chest pressure   [x] Palpitations   [] Shortness  of breath when laying flat   [] Shortness of breath at rest   [x] Shortness of breath with exertion. Vascular:  [] Pain in legs with walking   [] Pain in legs at rest   [] Pain in legs when laying flat   [] Claudication   [] Pain in feet when walking  [] Pain in feet at rest  [] Pain in feet when laying flat   [] History of DVT   [] Phlebitis   [] Swelling in legs   [] Varicose veins   [] Non-healing ulcers Pulmonary:   [] Uses home oxygen   [] Productive cough   [] Hemoptysis   [] Wheeze  [] COPD   [] Asthma Neurologic:  [] Dizziness  [] Blackouts   [] Seizures   [] History of stroke   [] History of TIA  [] Aphasia   [] Temporary blindness   [] Dysphagia   [] Weakness or numbness in arms   [] Weakness or numbness in legs Musculoskeletal:  [x] Arthritis   [] Joint swelling   [] Joint pain   [] Low back pain Hematologic:  [] Easy bruising  [] Easy bleeding   [] Hypercoagulable state   [] Anemic  [] Hepatitis Gastrointestinal:  [] Blood in stool   [] Vomiting blood  [] Gastroesophageal reflux/heartburn   [] Difficulty swallowing. Genitourinary:  [x] Chronic kidney disease   [] Difficult urination  [] Frequent urination  [] Burning with urination   [] Blood in urine Skin:  [] Rashes   [] Ulcers   [] Wounds Psychological:  [] History of anxiety   []  History of major depression.  Physical Examination  There were no vitals filed for this visit. There is no height or weight on file to calculate BMI. Gen: WD/WN, NAD Head: [place]/AT, No temporalis wasting.  Ear/Nose/Throat: Hearing grossly intact, nares w/o erythema or drainage, oropharynx w/o Erythema/Exudate,  Eyes: Conjunctiva clear, sclera non-icteric Neck: Trachea midline.  No JVD.  Pulmonary:  Good air movement, respirations not labored, no use of accessory muscles.  Cardiac: irregular Vascular:  Vessel Right Left  Radial Palpable Palpable              Gastrointestinal: soft, non-tender/non-distended. No guarding/reflex. PD catheter present Musculoskeletal: M/S 5/5 throughout.  Extremities  without ischemic changes.  No deformity or atrophy.  Neurologic: Sensation grossly intact in extremities.  Symmetrical.  Speech is fluent. Motor exam as listed above. Psychiatric: Judgment intact, Mood & affect appropriate for pt's clinical situation. Dermatologic: No rashes or ulcers noted.  No cellulitis or open wounds.    CBC Lab Results  Component Value Date   WBC 9.6 08/02/2017   HGB 9.6 (L) 08/02/2017   HCT 28.1 (L) 08/02/2017   MCV 99.7 08/02/2017   PLT 186 08/02/2017    BMET    Component Value Date/Time   NA 139 08/02/2017 0430   NA 140 05/30/2012 0540   K 4.6 08/02/2017 0430   K 3.7 05/30/2012 0540   CL 101 08/02/2017 0430   CL 105 05/30/2012 0540   CO2  26 08/02/2017 0430   CO2 28 05/30/2012 0540   GLUCOSE 153 (H) 08/02/2017 0430   GLUCOSE 94 05/30/2012 0540   BUN 35 (H) 08/02/2017 0430   BUN 30 (H) 05/30/2012 0540   CREATININE 3.02 (H) 08/02/2017 0430   CREATININE 1.63 (H) 05/30/2012 0540   CALCIUM 9.1 08/02/2017 0430   CALCIUM 8.6 05/30/2012 0540   GFRNONAA 17 (L) 08/02/2017 0430   GFRNONAA 38 (L) 05/30/2012 0540   GFRAA 20 (L) 08/02/2017 0430   GFRAA 44 (L) 05/30/2012 0540   CrCl cannot be calculated (Patient's most recent lab result is older than the maximum 21 days allowed.).  COAG Lab Results  Component Value Date   INR 1.09 07/25/2017   INR 1.47 03/20/2017   INR 1.29 03/19/2017    Radiology No results found.  Assessment/Plan 1.  Complication dialysis device with thrombosis AV access:  Patient's Tunneled catheter is not being used.  The PD catheter is working. Therefore, the patient will undergo removal of the tunneled catheter under local anesthesia.  The risks and benefits were described to the patient.  All questions were answered.  The patient agrees to proceed with angiography and intervention. Potassium will be drawn to ensure that it is an appropriate level prior to performing intervention. 2.  End-stage renal disease requiring  hemodialysis:  Patient will continue dialysis therapy without further interruption if a successful intervention is not achieved then a tunneled catheter will be placed. Dialysis has already been arranged. 3.  Hypertension:  Patient will continue medical management; nephrology is following no changes in oral medications. 4.  Coronary artery disease:  EKG will be monitored. Nitrates will be used if needed. The patient's oral cardiac medications will be continued.    Leotis Pain, MD  09/18/2017 11:01 AM

## 2017-12-01 ENCOUNTER — Emergency Department: Payer: Medicare Other

## 2017-12-01 ENCOUNTER — Emergency Department: Payer: Medicare Other | Admitting: Anesthesiology

## 2017-12-01 ENCOUNTER — Encounter: Payer: Self-pay | Admitting: *Deleted

## 2017-12-01 ENCOUNTER — Inpatient Hospital Stay
Admission: EM | Admit: 2017-12-01 | Discharge: 2017-12-06 | DRG: 353 | Disposition: A | Discharge status: Home Health | Payer: Medicare Other | Attending: Surgery | Admitting: Surgery

## 2017-12-01 ENCOUNTER — Encounter
Admission: EM | Disposition: A | Discharge status: Home Health | Payer: Self-pay | Source: Home / Self Care | Attending: Surgery

## 2017-12-01 ENCOUNTER — Inpatient Hospital Stay: Payer: Medicare Other

## 2017-12-01 ENCOUNTER — Other Ambulatory Visit: Payer: Self-pay

## 2017-12-01 DIAGNOSIS — Z881 Allergy status to other antibiotic agents status: Secondary | ICD-10-CM | POA: Diagnosis not present

## 2017-12-01 DIAGNOSIS — I5023 Acute on chronic systolic (congestive) heart failure: Secondary | ICD-10-CM | POA: Diagnosis present

## 2017-12-01 DIAGNOSIS — Z8673 Personal history of transient ischemic attack (TIA), and cerebral infarction without residual deficits: Secondary | ICD-10-CM

## 2017-12-01 DIAGNOSIS — N186 End stage renal disease: Secondary | ICD-10-CM | POA: Diagnosis not present

## 2017-12-01 DIAGNOSIS — I248 Other forms of acute ischemic heart disease: Secondary | ICD-10-CM | POA: Diagnosis present

## 2017-12-01 DIAGNOSIS — I480 Paroxysmal atrial fibrillation: Secondary | ICD-10-CM | POA: Diagnosis present

## 2017-12-01 DIAGNOSIS — Z9842 Cataract extraction status, left eye: Secondary | ICD-10-CM

## 2017-12-01 DIAGNOSIS — Z79899 Other long term (current) drug therapy: Secondary | ICD-10-CM | POA: Diagnosis not present

## 2017-12-01 DIAGNOSIS — K420 Umbilical hernia with obstruction, without gangrene: Secondary | ICD-10-CM

## 2017-12-01 DIAGNOSIS — Z9841 Cataract extraction status, right eye: Secondary | ICD-10-CM | POA: Diagnosis not present

## 2017-12-01 DIAGNOSIS — I447 Left bundle-branch block, unspecified: Secondary | ICD-10-CM | POA: Diagnosis present

## 2017-12-01 DIAGNOSIS — F419 Anxiety disorder, unspecified: Secondary | ICD-10-CM | POA: Diagnosis present

## 2017-12-01 DIAGNOSIS — J9621 Acute and chronic respiratory failure with hypoxia: Secondary | ICD-10-CM | POA: Diagnosis not present

## 2017-12-01 DIAGNOSIS — Z992 Dependence on renal dialysis: Secondary | ICD-10-CM

## 2017-12-01 DIAGNOSIS — K56609 Unspecified intestinal obstruction, unspecified as to partial versus complete obstruction: Secondary | ICD-10-CM

## 2017-12-01 DIAGNOSIS — D631 Anemia in chronic kidney disease: Secondary | ICD-10-CM | POA: Diagnosis present

## 2017-12-01 DIAGNOSIS — E43 Unspecified severe protein-calorie malnutrition: Secondary | ICD-10-CM | POA: Diagnosis present

## 2017-12-01 DIAGNOSIS — I69312 Visuospatial deficit and spatial neglect following cerebral infarction: Secondary | ICD-10-CM

## 2017-12-01 DIAGNOSIS — I132 Hypertensive heart and chronic kidney disease with heart failure and with stage 5 chronic kidney disease, or end stage renal disease: Secondary | ICD-10-CM | POA: Diagnosis present

## 2017-12-01 DIAGNOSIS — Z9981 Dependence on supplemental oxygen: Secondary | ICD-10-CM

## 2017-12-01 DIAGNOSIS — Z961 Presence of intraocular lens: Secondary | ICD-10-CM | POA: Diagnosis present

## 2017-12-01 DIAGNOSIS — R109 Unspecified abdominal pain: Secondary | ICD-10-CM | POA: Diagnosis not present

## 2017-12-01 DIAGNOSIS — E785 Hyperlipidemia, unspecified: Secondary | ICD-10-CM | POA: Diagnosis present

## 2017-12-01 DIAGNOSIS — K436 Other and unspecified ventral hernia with obstruction, without gangrene: Secondary | ICD-10-CM | POA: Diagnosis present

## 2017-12-01 DIAGNOSIS — Z87891 Personal history of nicotine dependence: Secondary | ICD-10-CM | POA: Diagnosis not present

## 2017-12-01 DIAGNOSIS — I10 Essential (primary) hypertension: Secondary | ICD-10-CM | POA: Diagnosis not present

## 2017-12-01 DIAGNOSIS — K432 Incisional hernia without obstruction or gangrene: Secondary | ICD-10-CM | POA: Diagnosis present

## 2017-12-01 DIAGNOSIS — Z888 Allergy status to other drugs, medicaments and biological substances status: Secondary | ICD-10-CM

## 2017-12-01 DIAGNOSIS — R410 Disorientation, unspecified: Secondary | ICD-10-CM | POA: Diagnosis not present

## 2017-12-01 DIAGNOSIS — N2581 Secondary hyperparathyroidism of renal origin: Secondary | ICD-10-CM | POA: Diagnosis present

## 2017-12-01 DIAGNOSIS — I959 Hypotension, unspecified: Secondary | ICD-10-CM | POA: Diagnosis present

## 2017-12-01 DIAGNOSIS — I252 Old myocardial infarction: Secondary | ICD-10-CM

## 2017-12-01 DIAGNOSIS — Z681 Body mass index (BMI) 19 or less, adult: Secondary | ICD-10-CM | POA: Diagnosis not present

## 2017-12-01 DIAGNOSIS — J9690 Respiratory failure, unspecified, unspecified whether with hypoxia or hypercapnia: Secondary | ICD-10-CM

## 2017-12-01 DIAGNOSIS — Z7982 Long term (current) use of aspirin: Secondary | ICD-10-CM

## 2017-12-01 DIAGNOSIS — J9601 Acute respiratory failure with hypoxia: Secondary | ICD-10-CM | POA: Diagnosis not present

## 2017-12-01 HISTORY — PX: VENTRAL HERNIA REPAIR: SHX424

## 2017-12-01 HISTORY — DX: Disorder of kidney and ureter, unspecified: N28.9

## 2017-12-01 LAB — PROTIME-INR
INR: 1.06
Prothrombin Time: 13.7 s (ref 11.4–15.2)

## 2017-12-01 LAB — COMPREHENSIVE METABOLIC PANEL
ALBUMIN: 3.2 g/dL — AB (ref 3.5–5.0)
ALK PHOS: 72 U/L (ref 38–126)
ALT: 21 U/L (ref 0–44)
AST: 31 U/L (ref 15–41)
Anion gap: 8 (ref 5–15)
BUN: 35 mg/dL — AB (ref 8–23)
CALCIUM: 9.1 mg/dL (ref 8.9–10.3)
CO2: 34 mmol/L — ABNORMAL HIGH (ref 22–32)
CREATININE: 3.18 mg/dL — AB (ref 0.61–1.24)
Chloride: 101 mmol/L (ref 98–111)
GFR calc Af Amer: 18 mL/min — ABNORMAL LOW (ref >60)
GFR, EST NON AFRICAN AMERICAN: 16 mL/min — AB (ref >60)
GLUCOSE: 143 mg/dL — AB (ref 70–99)
POTASSIUM: 4.5 mmol/L (ref 3.5–5.1)
Sodium: 143 mmol/L (ref 135–145)
TOTAL PROTEIN: 6.8 g/dL (ref 6.5–8.1)
Total Bilirubin: 1.1 mg/dL (ref 0.3–1.2)

## 2017-12-01 LAB — CBC
HEMATOCRIT: 37.1 % — AB (ref 40.0–52.0)
HEMOGLOBIN: 12.7 g/dL — AB (ref 13.0–18.0)
MCH: 33.2 pg (ref 26.0–34.0)
MCHC: 34.3 g/dL (ref 32.0–36.0)
MCV: 96.9 fL (ref 80.0–100.0)
Platelets: 185 10*3/uL (ref 150–440)
RBC: 3.83 MIL/uL — ABNORMAL LOW (ref 4.40–5.90)
RDW: 15.5 % — AB (ref 11.5–14.5)
WBC: 12.6 10*3/uL — AB (ref 3.8–10.6)

## 2017-12-01 LAB — TROPONIN I
Troponin I: 0.05 ng/mL
Troponin I: 0.06 ng/mL

## 2017-12-01 LAB — LIPASE, BLOOD: Lipase: 34 U/L (ref 11–51)

## 2017-12-01 LAB — TYPE AND SCREEN
ABO/RH(D): A POS
Antibody Screen: NEGATIVE

## 2017-12-01 LAB — APTT: APTT: 48 s — AB (ref 24–36)

## 2017-12-01 LAB — GLUCOSE, CAPILLARY: Glucose-Capillary: 147 mg/dL — ABNORMAL HIGH (ref 70–99)

## 2017-12-01 SURGERY — REPAIR, HERNIA, VENTRAL
Anesthesia: General | Site: Abdomen | Wound class: Contaminated

## 2017-12-01 MED ORDER — ONDANSETRON HCL 4 MG/2ML IJ SOLN: 4.0000 mg | Freq: Once | INTRAMUSCULAR | Status: DC | PRN

## 2017-12-01 MED ORDER — FENTANYL CITRATE (PF) 100 MCG/2ML IJ SOLN
25.0000 ug | INTRAMUSCULAR | Status: DC | PRN
  Administered 2017-12-01 (×4): 25 ug via INTRAVENOUS

## 2017-12-01 MED ORDER — DEXTROSE-NACL 5-0.9 % IV SOLN
INTRAVENOUS | Status: DC
  Administered 2017-12-01: via INTRAVENOUS

## 2017-12-01 MED ORDER — FENTANYL CITRATE (PF) 100 MCG/2ML IJ SOLN
INTRAMUSCULAR | Status: AC
  Administered 2017-12-01: 25 ug via INTRAVENOUS
  Filled 2017-12-01: qty 2

## 2017-12-01 MED ORDER — CEFAZOLIN SODIUM-DEXTROSE 2-4 GM/100ML-% IV SOLN
2.0000 g | Freq: Once | INTRAVENOUS | Status: AC
  Administered 2017-12-01: 2 g via INTRAVENOUS
  Filled 2017-12-01: qty 100

## 2017-12-01 MED ORDER — IOHEXOL 300 MG/ML  SOLN
75.0000 mL | Freq: Once | INTRAMUSCULAR | Status: AC | PRN
  Administered 2017-12-01: 75 mL via INTRAVENOUS

## 2017-12-01 MED ORDER — TAMSULOSIN HCL 0.4 MG PO CAPS
0.4000 mg | ORAL_CAPSULE | Freq: Every evening | ORAL | Status: DC
  Administered 2017-12-04: 0.4 mg via ORAL
  Filled 2017-12-01: qty 1

## 2017-12-01 MED ORDER — HYDRALAZINE HCL 20 MG/ML IJ SOLN: 10.0000 mg | INTRAMUSCULAR | Status: DC | PRN

## 2017-12-01 MED ORDER — NEOSTIGMINE METHYLSULFATE 10 MG/10ML IV SOLN
INTRAVENOUS | Status: DC | PRN
  Administered 2017-12-01: 3 mg via INTRAVENOUS

## 2017-12-01 MED ORDER — ASPIRIN EC 81 MG PO TBEC: 81.0000 mg | DELAYED_RELEASE_TABLET | Freq: Every day | ORAL | Status: DC

## 2017-12-01 MED ORDER — ORAL CARE MOUTH RINSE
15.0000 mL | OROMUCOSAL | Status: DC
  Administered 2017-12-01 – 2017-12-02 (×5): 15 mL via OROMUCOSAL

## 2017-12-01 MED ORDER — ROCURONIUM BROMIDE 100 MG/10ML IV SOLN
INTRAVENOUS | Status: DC | PRN
  Administered 2017-12-01: 20 mg via INTRAVENOUS

## 2017-12-01 MED ORDER — BISACODYL 10 MG RE SUPP: 10.0000 mg | Freq: Every day | RECTAL | Status: DC | PRN

## 2017-12-01 MED ORDER — SODIUM CHLORIDE 0.9 % IV SOLN
INTRAVENOUS | Status: DC | PRN
  Administered 2017-12-01: 21:00:00 via INTRAVENOUS

## 2017-12-01 MED ORDER — GLYCOPYRROLATE 0.2 MG/ML IJ SOLN
INTRAMUSCULAR | Status: DC | PRN
  Administered 2017-12-01: 0.6 mg via INTRAVENOUS

## 2017-12-01 MED ORDER — PROPOFOL 10 MG/ML IV BOLUS
INTRAVENOUS | Status: DC | PRN
  Administered 2017-12-01: 100 mg via INTRAVENOUS

## 2017-12-01 MED ORDER — HEPARIN SODIUM (PORCINE) 5000 UNIT/ML IJ SOLN
5000.0000 [IU] | Freq: Three times a day (TID) | INTRAMUSCULAR | Status: DC
  Administered 2017-12-01 – 2017-12-04 (×4): 5000 [IU] via SUBCUTANEOUS
  Filled 2017-12-01 (×5): qty 1

## 2017-12-01 MED ORDER — ORAL CARE MOUTH RINSE: 15.0000 mL | OROMUCOSAL | Status: DC

## 2017-12-01 MED ORDER — CHLORHEXIDINE GLUCONATE 0.12% ORAL RINSE (MEDLINE KIT)
15.0000 mL | Freq: Two times a day (BID) | OROMUCOSAL | Status: DC
  Administered 2017-12-02: 15 mL via OROMUCOSAL

## 2017-12-01 MED ORDER — ONDANSETRON HCL 4 MG/2ML IJ SOLN
INTRAMUSCULAR | Status: DC | PRN
  Administered 2017-12-01: 4 mg via INTRAVENOUS

## 2017-12-01 MED ORDER — FENTANYL 2500MCG IN NS 250ML (10MCG/ML) PREMIX INFUSION
25.0000 ug/h | INTRAVENOUS | Status: DC
  Administered 2017-12-01: 50 ug/h via INTRAVENOUS
  Filled 2017-12-01: qty 250

## 2017-12-01 MED ORDER — ALPRAZOLAM 0.25 MG PO TABS: 0.2500 mg | ORAL_TABLET | Freq: Every evening | ORAL | Status: DC | PRN

## 2017-12-01 MED ORDER — SODIUM CHLORIDE 0.9 % IV BOLUS
250.0000 mL | Freq: Once | INTRAVENOUS | Status: AC
  Administered 2017-12-01: 250 mL via INTRAVENOUS

## 2017-12-01 MED ORDER — ONDANSETRON 4 MG PO TBDP
4.0000 mg | ORAL_TABLET | Freq: Four times a day (QID) | ORAL | Status: DC | PRN
  Filled 2017-12-01: qty 1

## 2017-12-01 MED ORDER — BUPIVACAINE-EPINEPHRINE (PF) 0.25% -1:200000 IJ SOLN
INTRAMUSCULAR | Status: AC
  Filled 2017-12-01: qty 30

## 2017-12-01 MED ORDER — MIDAZOLAM HCL 2 MG/2ML IJ SOLN: 1.0000 mg | INTRAMUSCULAR | Status: DC | PRN

## 2017-12-01 MED ORDER — FAMOTIDINE IN NACL 20-0.9 MG/50ML-% IV SOLN
20.0000 mg | Freq: Two times a day (BID) | INTRAVENOUS | Status: DC
  Administered 2017-12-01: 20 mg via INTRAVENOUS
  Filled 2017-12-01: qty 50

## 2017-12-01 MED ORDER — AMIODARONE HCL 200 MG PO TABS: 200.0000 mg | ORAL_TABLET | Freq: Every day | ORAL | Status: DC

## 2017-12-01 MED ORDER — FENTANYL BOLUS VIA INFUSION
25.0000 ug | INTRAVENOUS | Status: DC | PRN
  Administered 2017-12-01 – 2017-12-02 (×2): 50 ug via INTRAVENOUS
  Filled 2017-12-01: qty 50

## 2017-12-01 MED ORDER — EPHEDRINE SULFATE 50 MG/ML IJ SOLN
INTRAMUSCULAR | Status: DC | PRN
  Administered 2017-12-01: 10 mg via INTRAVENOUS

## 2017-12-01 MED ORDER — MIDAZOLAM HCL 2 MG/2ML IJ SOLN
1.0000 mg | INTRAMUSCULAR | Status: DC | PRN
  Administered 2017-12-02: 1 mg via INTRAVENOUS
  Filled 2017-12-01: qty 2

## 2017-12-01 MED ORDER — HYDROCODONE-ACETAMINOPHEN 5-325 MG PO TABS: 1.0000 | ORAL_TABLET | ORAL | Status: DC | PRN

## 2017-12-01 MED ORDER — IPRATROPIUM-ALBUTEROL 0.5-2.5 (3) MG/3ML IN SOLN
3.0000 mL | Freq: Four times a day (QID) | RESPIRATORY_TRACT | Status: DC
  Administered 2017-12-02 – 2017-12-03 (×6): 3 mL via RESPIRATORY_TRACT
  Filled 2017-12-01 (×7): qty 3

## 2017-12-01 MED ORDER — FENTANYL CITRATE (PF) 100 MCG/2ML IJ SOLN
INTRAMUSCULAR | Status: DC | PRN
  Administered 2017-12-01: 100 ug via INTRAVENOUS

## 2017-12-01 MED ORDER — PROPOFOL 10 MG/ML IV BOLUS
INTRAVENOUS | Status: AC
  Filled 2017-12-01: qty 20

## 2017-12-01 MED ORDER — ONDANSETRON HCL 4 MG/2ML IJ SOLN: 4.0000 mg | Freq: Four times a day (QID) | INTRAMUSCULAR | Status: DC | PRN

## 2017-12-01 MED ORDER — FENTANYL CITRATE (PF) 100 MCG/2ML IJ SOLN: 50.0000 ug | Freq: Once | INTRAMUSCULAR | Status: DC

## 2017-12-01 MED ORDER — MORPHINE SULFATE (PF) 2 MG/ML IV SOLN: 2.0000 mg | INTRAVENOUS | Status: DC | PRN

## 2017-12-01 MED ORDER — SENNOSIDES 8.8 MG/5ML PO SYRP
5.0000 mL | ORAL_SOLUTION | Freq: Two times a day (BID) | ORAL | Status: DC | PRN
  Filled 2017-12-01: qty 5

## 2017-12-01 MED ORDER — CHLORHEXIDINE GLUCONATE 0.12% ORAL RINSE (MEDLINE KIT): 15.0000 mL | Freq: Two times a day (BID) | OROMUCOSAL | Status: DC

## 2017-12-01 MED ORDER — MIDODRINE HCL 5 MG PO TABS
5.0000 mg | ORAL_TABLET | Freq: Three times a day (TID) | ORAL | Status: DC
  Filled 2017-12-01 (×2): qty 1

## 2017-12-01 MED ORDER — FENTANYL CITRATE (PF) 100 MCG/2ML IJ SOLN
INTRAMUSCULAR | Status: AC
  Filled 2017-12-01: qty 2

## 2017-12-01 MED ORDER — BUPIVACAINE-EPINEPHRINE (PF) 0.25% -1:200000 IJ SOLN
INTRAMUSCULAR | Status: DC | PRN
  Administered 2017-12-01: 30 mL via PERINEURAL

## 2017-12-01 MED ORDER — CEFAZOLIN SODIUM 1 G IJ SOLR
INTRAMUSCULAR | Status: AC
  Filled 2017-12-01: qty 20

## 2017-12-01 MED ORDER — ALBUMIN HUMAN 5 % IV SOLN
INTRAVENOUS | Status: AC
  Filled 2017-12-01: qty 250

## 2017-12-01 SURGICAL SUPPLY — 51 items
APPLIER CLIP 11 MED OPEN (CLIP)
APPLIER CLIP 13 LRG OPEN (CLIP)
BARRIER ADH SEPRAFILM 3INX5IN (MISCELLANEOUS) IMPLANT
BLADE CLIPPER SURG (BLADE) ×4 IMPLANT
BLADE SURG SZ10 CARB STEEL (BLADE) ×4 IMPLANT
CANISTER SUCT 3000ML PPV (MISCELLANEOUS) ×4 IMPLANT
CHLORAPREP W/TINT 26ML (MISCELLANEOUS) ×4 IMPLANT
CLIP APPLIE 11 MED OPEN (CLIP) IMPLANT
CLIP APPLIE 13 LRG OPEN (CLIP) IMPLANT
DRAPE LAPAROTOMY 100X77 ABD (DRAPES) ×4 IMPLANT
DRAPE TABLE BACK 80X90 (DRAPES) ×4 IMPLANT
DRSG TEGADERM 2-3/8X2-3/4 SM (GAUZE/BANDAGES/DRESSINGS) IMPLANT
DRSG TELFA 3X8 NADH (GAUZE/BANDAGES/DRESSINGS) ×4 IMPLANT
ELECT BLADE 6.5 EXT (BLADE) ×4 IMPLANT
ELECT REM PT RETURN 9FT ADLT (ELECTROSURGICAL) ×4
ELECTRODE REM PT RTRN 9FT ADLT (ELECTROSURGICAL) ×2 IMPLANT
GAUZE SPONGE 4X4 12PLY STRL (GAUZE/BANDAGES/DRESSINGS) ×8 IMPLANT
GLOVE BIO SURGEON STRL SZ7 (GLOVE) ×8 IMPLANT
GLOVE BIOGEL PI IND STRL 7.0 (GLOVE) ×2 IMPLANT
GLOVE BIOGEL PI INDICATOR 7.0 (GLOVE) ×2
GOWN STRL REUS W/ TWL LRG LVL3 (GOWN DISPOSABLE) ×4 IMPLANT
GOWN STRL REUS W/TWL LRG LVL3 (GOWN DISPOSABLE) ×4
HANDLE SUCTION POOLE (INSTRUMENTS) IMPLANT
HANDLE YANKAUER SUCT BULB TIP (MISCELLANEOUS) ×4 IMPLANT
LIGASURE IMPACT 36 18CM CVD LR (INSTRUMENTS) IMPLANT
NEEDLE HYPO 22GX1.5 SAFETY (NEEDLE) ×8 IMPLANT
NEEDLE HYPO 25X1 1.5 SAFETY (NEEDLE) ×4 IMPLANT
PACK BASIN MAJOR ARMC (MISCELLANEOUS) ×4 IMPLANT
PACK BASIN MINOR ARMC (MISCELLANEOUS) IMPLANT
RELOAD PROXIMATE 75MM BLUE (ENDOMECHANICALS) IMPLANT
SPONGE LAP 18X18 RF (DISPOSABLE) ×4 IMPLANT
SPONGE LAP 18X36 RFD (DISPOSABLE) IMPLANT
STAPLER PROXIMATE 75MM BLUE (STAPLE) IMPLANT
STAPLER SKIN PROX 35W (STAPLE) ×4 IMPLANT
SUCTION POOLE HANDLE (INSTRUMENTS)
SUT ETHIBOND 0 MO6 C/R (SUTURE) ×4 IMPLANT
SUT ETHIBOND NAB MO 7 #0 18IN (SUTURE) IMPLANT
SUT PDS AB 0 CT1 27 (SUTURE) IMPLANT
SUT SILK 2 0 (SUTURE)
SUT SILK 2 0 SH CR/8 (SUTURE) IMPLANT
SUT SILK 2 0SH CR/8 30 (SUTURE) IMPLANT
SUT SILK 2-0 18XBRD TIE 12 (SUTURE) IMPLANT
SUT VIC AB 0 CT1 36 (SUTURE) ×4 IMPLANT
SUT VIC AB 2-0 SH 27 (SUTURE) ×2
SUT VIC AB 2-0 SH 27XBRD (SUTURE) ×2 IMPLANT
SYR 20CC LL (SYRINGE) ×4 IMPLANT
SYR 30ML LL (SYRINGE) ×8 IMPLANT
SYR 3ML LL SCALE MARK (SYRINGE) ×4 IMPLANT
TAPE MICROFOAM 4IN (TAPE) IMPLANT
TRAY FOLEY MTR SLVR 16FR STAT (SET/KITS/TRAYS/PACK) IMPLANT
WATER STERILE IRR 1000ML POUR (IV SOLUTION) IMPLANT

## 2017-12-01 NOTE — ED Provider Notes (Addendum)
[facility] Emergency Department Provider Note  ____________________________________________  Time seen: Approximately 4:54 PM  I have reviewed the triage vital signs and the nursing notes.   HISTORY  Chief Complaint Abdominal Pain    HPI Ruben Mohammad Sr. is a 84 y.o. male history of umbilical hernia repair, ESRD on peritoneal dialysis, presenting with abdominal pain and distention.  The patient reports that for the past 4 days, he has had a constant lower abdominal pain localized to just below the umbilicus, but occasional episodes where the pain becomes acutely worse.  He has been able to complete his dialysis without any issues.  He has not had any fevers or chills, but last night did have approximately 6 episodes of vomiting which was dark.  Today, the patient took Tylenol for his pain which improved his pain.  His last bowel movement was yesterday and it was normal.  Today, he presented to his PMD who noted a purplish discoloration around the umbilicus and was concerned about incarcerated hernia and sent him here for further evaluation.  The patient's wife also states he has been complaining of some central chest pain, without any further characterizing factors, and the patient states he is not having pain now and does not remember stating that he had chest pain.  Past Medical History:  Diagnosis Date  . Acute pulmonary edema ([place]) 02/26/2013  . Anxiety   . Arthritis    little  . Benign prostatic hyperplasia with urinary obstruction 02/24/2012  . BP (high blood pressure) 10/31/2015  . Cataract extraction status of left eye   . Cataract extraction status of right eye   . Cerebrovascular accident (CVA) ([place]) 04/24/2012   affected right eye, limited peripheral vision  . Chronic kidney disease 07/25/2017   stage 3  . Congestive heart failure ([place]) 10/31/2015  . Dyspnea    uses continuous o2  . Dysrhythmia    AF  . HLD (hyperlipidemia) 10/31/2015  . Hydrocele  02/25/2012  . Myocardial infarction ([place])    per patient, small MI  . Renal insufficiency   . Unstable angina pectoris ([place]) 02/26/2013  . Urge incontinence of urine 05/30/2014  . Urinary urgency 10/31/2015    Patient Active Problem List   Diagnosis Date Noted  . ESRD (end stage renal disease) ([place]) 08/01/2017  . ESRD on dialysis ([place]) 07/09/2017  . Palliative care by specialist   . Goals of care, counseling/discussion   . Atrial fibrillation with RVR ([place]) 03/09/2017  . Recurrent umbilical hernia with incarceration   . Small bowel obstruction ([place])   . Acute on chronic systolic CHF (congestive heart failure) ([place]) 08/23/2016  . CKD (chronic kidney disease), stage III ([place]) 08/23/2016  . Elevated troponin 08/23/2016  . Thrombocytopenia ([place]) 08/23/2016  . Hyperkalemia 11/06/2015  . Urinary urgency 10/31/2015  . Chronic kidney disease, stage 3 ([place]) 10/31/2015  . Congestive heart failure ([place]) 10/31/2015  . Hyperlipidemia 10/31/2015  . BP (high blood pressure) 10/31/2015  . Cerebrovascular accident (CVA) ([place]) 10/31/2015  . Phimosis 10/31/2015  . 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 Benign fibroma of prostate 05/30/2014  . Urge incontinence of urine 05/30/2014  . Chronic kidney disease, stage IV (severe) ([place]) 02/16/2014  . Acute pulmonary edema ([place]) 02/26/2013  . Unstable angina pectoris ([place]) 02/26/2013  . Hydrocele 02/25/2012  . Benign prostatic hyperplasia with urinary obstruction 02/24/2012    Past Surgical History:  Procedure Laterality Date  . ANKLE FRACTURE SURGERY Right 2006   metal in ankle,  screws and plate  . CAPD INSERTION N/A 08/01/2017   Procedure: LAPAROSCOPIC INSERTION CONTINUOUS AMBULATORY PERITONEAL DIALYSIS  (CAPD) CATHETER;  Surgeon: Katha Cabal, MD;  Location: [facility] ORS;  Service: Vascular;  Laterality: N/A;  . CIRCUMCISION N/A 11/13/2015   Procedure: CIRCUMCISION ADULT;  Surgeon: Hollice Espy, MD;  Location: [facility] ORS;  Service:  Urology;  Laterality: N/A;  . DIALYSIS/PERMA CATHETER INSERTION N/A 03/17/2017   Procedure: DIALYSIS/PERMA CATHETER INSERTION;  Surgeon: Algernon Huxley, MD;  Location: [place] [facility];  Service: Cardiovascular;  Laterality: N/A;  . DIALYSIS/PERMA CATHETER REMOVAL N/A 09/18/2017   Procedure: DIALYSIS/PERMA CATHETER REMOVAL;  Surgeon: Algernon Huxley, MD;  Location: [place] [facility];  Service: Cardiovascular;  Laterality: N/A;  . EYE SURGERY Bilateral    cataract extractions  . HERNIA REPAIR    . HYDROCELE EXCISION Bilateral 11/13/2015   Procedure: HYDROCELECTOMY ADULT;  Surgeon: Hollice Espy, MD;  Location: [facility] ORS;  Service: Urology;  Laterality: Bilateral;  . TONSILLECTOMY  1950's  . TONSILLECTOMY    . [place]   with mesh  . VARICOCELECTOMY  1948  . VASECTOMY      Current Outpatient Rx  . Order #: [id number] Class: Historical Med  . Order #: [id number] Class: Historical Med  . Order #: [id number] Class: Print  . Order #: [id number] Class: Print  . Order #: [id number] Class: Historical Med  . Order #: [id number] Class: Print  . Order #: [id number] Class: Print  . Order #: [id number] Class: Historical Med  . Order #: [id number] Class: Historical Med  . Order #: [id number] Class: Historical Med  . Order #: [id number] Class: Historical Med  . Order #: [id number] Class: Historical Med  . Order #: [id number] Class: Historical Med    Allergies Eliquis [apixaban] and Acyclovir and related  Family History  Problem Relation Age of Onset  . Kidney cancer Neg Hx   . Prostate cancer Neg Hx     Social History Social History   Tobacco Use  . Smoking status: Former Smoker    Packs/day: 1.00    Years: 20.00    Pack years: 20.00    Types: Cigarettes    Last attempt to quit: 11/03/1954    Years since quitting: 63.1  . Smokeless tobacco: Never Used  . Tobacco comment: started smoking at 84 years old  Substance Use Topics  . Alcohol use: No    Alcohol/week: 4.8 oz    Types: 7  Shots of liquor, 1 Standard drinks or equivalent per week    Frequency: Never    Comment: 1 shot of gin a day.no gin x 1 year since dialysis  . Drug use: No    Review of Systems Constitutional: No fever/chills.  No lightheadedness or syncope. Eyes: No visual changes. ENT: No sore throat. No congestion or rhinorrhea. Cardiovascular: As of the chest pain. Denies palpitations. Respiratory: Denies shortness of breath.  No cough. Gastrointestinal: Positive abdominal pain.  Positive nausea, positive vomiting.  No diarrhea.  No constipation.  No pain around peritoneal dialysis catheter. Genitourinary: Negative for dysuria. Musculoskeletal: Negative for back pain. Skin: Negative for rash. Neurological: Negative for headaches. No focal numbness, tingling or weakness.     ____________________________________________   PHYSICAL EXAM:  VITAL SIGNS: ED Triage Vitals  Enc Vitals Group     BP 12/01/17 1618 121/64     Pulse Rate 12/01/17 1618 69     Resp 12/01/17 1618 18     Temp 12/01/17 1618 98.1 F (36.7 C)     Temp Source 12/01/17 1618  Oral     SpO2 12/01/17 1618 100 %     Weight 12/01/17 1620 130 lb (59 kg)     Height 12/01/17 1620 5\' 9"  (1.753 m)     Head Circumference --      Peak Flow --      Pain Score 12/01/17 1619 7     Pain Loc --      Pain Edu? --      Excl. in [place]? --     Constitutional: Alert and oriented.  Answers questions appropriately.  Likely ill-appearing and mildly uncomfortable. Eyes: Conjunctivae are normal.  EOMI. No scleral icterus. Head: Atraumatic. Nose: No congestion/rhinnorhea. Mouth/Throat: Mucous membranes are mildly dry.  Neck: No stridor.  Supple.   Cardiovascular: Normal rate, regular rhythm. No murmurs, rubs or gallops.  Respiratory: Normal respiratory effort.  No accessory muscle use or retractions. Lungs CTAB.  No wheezes, rales or ronchi. Gastrointestinal: Soft, and nondistended.  The patient has a peritoneal dialysis catheter in the left  lower quadrant without any surrounding erythema, swelling or discharge.  The patient does have discomfort in the left lower quadrant and a palpable mass which runs towards the umbilicus with a protruding umbilicus with minimal purplish discoloration.  The mass in the umbilicus is not reducible.  No guarding or rebound.  No peritoneal signs. Musculoskeletal: No LE edema. Neurologic: alert.  Speech is clear.  Face and smile are symmetric.  EOMI.  Moves all extremities well. Skin:  Skin is warm, dry and intact. No rash noted. Psychiatric: Mood and affect are normal. _________________________________   LABS (all labs ordered are listed, but only abnormal results are displayed)  Labs Reviewed  CBC - Abnormal; Notable for the following components:      Result Value   WBC 12.6 (*)    RBC 3.83 (*)    Hemoglobin 12.7 (*)    HCT 37.1 (*)    RDW 15.5 (*)    All other components within normal limits  COMPREHENSIVE METABOLIC PANEL - Abnormal; Notable for the following components:   CO2 34 (*)    Glucose, Bld 143 (*)    BUN 35 (*)    Creatinine, Ser 3.18 (*)    Albumin 3.2 (*)    GFR calc non Af Amer 16 (*)    GFR calc Af Amer 18 (*)    All other components within normal limits  TROPONIN I - Abnormal; Notable for the following components:   Troponin I 0.05 (*)    All other components within normal limits  LIPASE, BLOOD   ____________________________________________  EKG  ED ECG REPORT I, Eula Listen, the attending physician, personally viewed and interpreted this ECG.   Date: 12/01/2017  EKG Time: 1702  Rate: 69  Rhythm: normal sinus rhythm  Axis: normal  Intervals:prolonged QTc; interventricular delay  ST&T Change: No STEMI; ST elevation 75mm in V1  EKG is compared to to prior EKGs on 03/09/2017 and 03/21/2017, both of which show A. fib which is not present here.  He does have the interventricular delay in those prior  EKGs.  ____________________________________________  RADIOLOGY  Dg Chest 2 View  Result Date: 12/01/2017 CLINICAL DATA:  Nausea and vomiting since yesterday, swelling around calf site, distension to lower abdomen; history hypertension, CHF, stroke, post MI, former smoker, stage III chronic kidney disease EXAM: CHEST - 2 VIEW COMPARISON:  08/01/2017 FINDINGS: Enlargement of cardiac silhouette with pulmonary vascular congestion. Atherosclerotic calcification aorta. Minimal peribronchial thickening with small BILATERAL pleural effusions and mild  pulmonary edema, improved since previous exam. Minimal bibasilar atelectasis. No pneumothorax. Bones demineralized. IMPRESSION: Improved CHF with decreased bibasilar pleural effusions and atelectasis. Electronically Signed   By: Lavonia Dana M.D.   On: 12/01/2017 17:52   Ct Abdomen Pelvis W Contrast  Result Date: 12/01/2017 CLINICAL DATA:  84 year old male with lower abdominal distension and swelling at peritoneal dialysis catheter site. Nausea vomiting since yesterday. EXAM: CT ABDOMEN AND PELVIS WITH CONTRAST TECHNIQUE: Multidetector CT imaging of the abdomen and pelvis was performed using the standard protocol following bolus administration of intravenous contrast. CONTRAST:  56mL OMNIPAQUE IOHEXOL 300 MG/ML  SOLN COMPARISON:  Noncontrast CT Abdomen and Pelvis 04/19/2017 and earlier. FINDINGS: Lower chest: Small bilateral pleural effusions are layering and subpulmonic and regressed compared to November 2018. Cardiomegaly appears stable. No pericardial effusion. Lung base atelectasis without consolidation. Hepatobiliary: Dependent sludge or vicarious contrast excretion to the gallbladder. Negative liver. No biliary ductal enlargement. Pancreas: Negative. Spleen: Negative. Adrenals/Urinary Tract: Normal adrenal glands. There is a degree of native renal atrophy, but symmetric bilateral renal enhancement and contrast excretion. No hydronephrosis or hydroureter.  Diminutive and unremarkable urinary bladder. Stomach/Bowel: Partially decompressed rectosigmoid colon, which might account for the appearance of mild sigmoid wall thickening on series 2, image 58. Mild redundancy at the junction of the sigmoid and descending colon. The left colon has a somewhat medial course. Retained stool throughout the transverse and right colon. The hepatic flexure is mildly redundant. The terminal ileum is decompressed along with most small bowel loops in the pelvis. However, there are dilated proximal and mid small bowel loops which are fluid-filled and measure up to 34 millimeters diameter. An abrupt transition occurs at a small umbilical hernia which contains a knuckle of small bowel best seen on sagittal image 51, along with a small volume of free fluid. From here the downstream loops are decompressed and the upstream loops are dilated. This hernia occurs just superior and medial to the left PD catheter percutaneous site, which appears unrelated. The proximal stomach is also dilated with fluid although the distal stomach and duodenum are relatively decompressed. No abdominal free air. Trace free fluid elsewhere. Vascular/Lymphatic: Aortoiliac calcified atherosclerosis, with atherosclerosis of the major aortic branches. Major arterial structures are patent. Portal venous system is patent. No lymphadenopathy. Reproductive: Prior right inguinal hernia repair with no adverse features. Other: Small volume pelvic free to fluid the peritoneal in proximity dialysis catheter. Musculoskeletal: No acute osseous abnormality identified. IMPRESSION: 1. Small incarcerated umbilical hernia resulting in High-grade Small Bowel Obstruction. This hernia and obstruction appear unrelated to the PD catheter. See sagittal image 51, coronal image 24. 2. No free air. Small volume of free fluid in the abdomen and pelvis which could be reactive due to #1 or peritoneal dialysis related. 3. Small bilateral pleural  effusions are regressed compared to February 2018. 4.  Aortic Atherosclerosis (ICD10-I70.0). Electronically Signed   By: Genevie Ann M.D.   On: 12/01/2017 18:16    ____________________________________________   PROCEDURES  Procedure(s) performed: None  Procedures  Critical Care performed: No ____________________________________________   INITIAL IMPRESSION / ASSESSMENT AND PLAN / ED COURSE  Pertinent labs & imaging results that were available during my care of the patient were reviewed by me and considered in my medical decision making (see chart for details).  84 y.o. male with a prior history umbilical hernia repair, left lower quadrant peritoneal dialysis catheter, presenting with abdominal pain, abdominal distention, nausea and vomiting.  Overall, the patient is hemodynamically stable and afebrile.  He does have an abnormal abdominal examination and will undergo CT evaluation for consideration of hernia or incarcerated hernia, complication from his peritoneal dialysis catheter including abscess, or other intra-abdominal process.  The patient denies needing any pain or nausea medication at this time.  Fluids are held due to his dialysis status.  Plan reevaluation for final disposition.  ----------------------------------------- 6:36 PM on 12/01/2017 -----------------------------------------  The patient CT scan does show an incarcerated umbilical hernia resulting in a high-grade small bowel obstruction.  The patient will receive an NG tube, and remain n.p.o.  Given that he is a dialysis patient I will give him a very small 250 cc fluid bolus to prevent dehydration.  The general surgeon has been called for admission.  At this time, the patient remains hemodynamically stable and his symptoms are well controlled.  7:04 PM   I have spoken to Dr. Dahlia Byes who has known this patient in the past.  His operative risk is great and the goal will be conservative management at this time, with NGT.  I  have consulted the hospitalist for management of his numerous comorbidities per Dr. Corlis Leak request.  ____________________________________________  FINAL CLINICAL IMPRESSION(S) / ED DIAGNOSES  Final diagnoses:  Incarcerated umbilical hernia  Small bowel obstruction ([place])         NEW MEDICATIONS STARTED DURING THIS VISIT:  New Prescriptions   No medications on file      Eula Listen, MD 12/01/17 1844    Eula Listen, MD 12/01/17 1905

## 2017-12-01 NOTE — Anesthesia Preprocedure Evaluation (Signed)
Anesthesia Evaluation  Patient identified by MRN, date of birth, ID band Patient awake    Reviewed: Allergy & Precautions, NPO status , Patient's Chart, lab work & pertinent test results  History of Anesthesia Complications Negative for: history of anesthetic complications  Airway Mallampati: II       Dental   Pulmonary sleep apnea and Oxygen sleep apnea ,  oxygen dependent, former smoker,           Cardiovascular hypertension, Pt. on medications + Past MI and +CHF  + dysrhythmias Atrial Fibrillation      Neuro/Psych neg Seizures Anxiety CVA    GI/Hepatic Neg liver ROS, neg GERD  ,  Endo/Other  neg diabetes  Renal/GU Dialysis and ESRFRenal disease     Musculoskeletal   Abdominal   Peds  Hematology   Anesthesia Other Findings   Reproductive/Obstetrics                             Anesthesia Physical Anesthesia Plan  ASA: IV and emergent  Anesthesia Plan: General   Post-op Pain Management:    Induction: Intravenous  PONV Risk Score and Plan: 2 and Dexamethasone and Ondansetron  Airway Management Planned: Oral ETT  Additional Equipment:   Intra-op Plan:   Post-operative Plan:   Informed Consent: I have reviewed the patients History and Physical, chart, labs and discussed the procedure including the risks, benefits and alternatives for the proposed anesthesia with the patient or authorized representative who has indicated his/her understanding and acceptance.     Plan Discussed with:   Anesthesia Plan Comments:         Anesthesia Quick Evaluation

## 2017-12-01 NOTE — H&P (Signed)
Patient ID: Ruben Doris Sr., male   DOB: 07-Dec-1925, 84 y.o.   MRN: [id number]  HPI Ruben Gutridge Sr. is a 84 y.o. male the emergency room for acute onset of abdominal pain that started less than 24-36 hours ago.  Patient reports the pain is severe intermittent and sharp in nature.  Of note the patient had a similar episode about a year ago that resolved with medical management. He reports that the pain is worsening with Valsalva.  He did have some nausea and vomiting as well.  No fevers no chills. Scan personally reviewed there is evidence of an incarcerated ventral hernia with a piece of small bowel that  is creating  A bowel obstruction there is no free air.   Have a history of congestive heart failure and I saw him about a year ago but at that time he was significantly compensated from his heart failure.  Now he has significant improvement on his overall condition.  He is able to walk for a few yards. Does have some short memory loss but overall he is competent and able to make his own decisions. lab values include potassium 4.5 and a creatinine of 3.1 albumin of 3.2 troponin of 0.05, count of 12,000.  EKG showing no evidence of acute ischemia. As discussed with anesthesia in detail. He did have a history of ventral hernia in the 1990s with mesh placement.  No operative reports are available    HPI  Past Medical History:  Diagnosis Date  . Acute pulmonary edema ([place]) 02/26/2013  . Anxiety   . Arthritis    little  . Benign prostatic hyperplasia with urinary obstruction 02/24/2012  . BP (high blood pressure) 10/31/2015  . Cataract extraction status of left eye   . Cataract extraction status of right eye   . Cerebrovascular accident (CVA) ([place]) 04/24/2012   affected right eye, limited peripheral vision  . Chronic kidney disease 07/25/2017   stage 3  . Congestive heart failure ([place]) 10/31/2015  . Dyspnea    uses continuous o2  . Dysrhythmia    AF  . HLD (hyperlipidemia) 10/31/2015  .  Hydrocele 02/25/2012  . Myocardial infarction ([place])    per patient, small MI  . Renal insufficiency   . Unstable angina pectoris ([place]) 02/26/2013  . Urge incontinence of urine 05/30/2014  . Urinary urgency 10/31/2015    Past Surgical History:  Procedure Laterality Date  . ANKLE FRACTURE SURGERY Right 2006   metal in ankle, screws and plate  . CAPD INSERTION N/A 08/01/2017   Procedure: LAPAROSCOPIC INSERTION CONTINUOUS AMBULATORY PERITONEAL DIALYSIS  (CAPD) CATHETER;  Surgeon: Katha Cabal, MD;  Location: [facility] ORS;  Service: Vascular;  Laterality: N/A;  . CIRCUMCISION N/A 11/13/2015   Procedure: CIRCUMCISION ADULT;  Surgeon: Hollice Espy, MD;  Location: [facility] ORS;  Service: Urology;  Laterality: N/A;  . DIALYSIS/PERMA CATHETER INSERTION N/A 03/17/2017   Procedure: DIALYSIS/PERMA CATHETER INSERTION;  Surgeon: Algernon Huxley, MD;  Location: [facility];  Service: Cardiovascular;  Laterality: N/A;  . DIALYSIS/PERMA CATHETER REMOVAL N/A 09/18/2017   Procedure: DIALYSIS/PERMA CATHETER REMOVAL;  Surgeon: Algernon Huxley, MD;  Location: [place] [facility];  Service: Cardiovascular;  Laterality: N/A;  . EYE SURGERY Bilateral    cataract extractions  . HERNIA REPAIR    . HYDROCELE EXCISION Bilateral 11/13/2015   Procedure: HYDROCELECTOMY ADULT;  Surgeon: Hollice Espy, MD;  Location: [facility] ORS;  Service: Urology;  Laterality: Bilateral;  . TONSILLECTOMY  1950's  . TONSILLECTOMY    .  [place]   with mesh  . VARICOCELECTOMY  1948  . VASECTOMY      Family History  Problem Relation Age of Onset  . Kidney cancer Neg Hx   . Prostate cancer Neg Hx     Social History Social History   Tobacco Use  . Smoking status: Former Smoker    Packs/day: 1.00    Years: 20.00    Pack years: 20.00    Types: Cigarettes    Last attempt to quit: 11/03/1954    Years since quitting: 63.1  . Smokeless tobacco: Never Used  . Tobacco comment: started smoking at 84 years old  Substance  Use Topics  . Alcohol use: No    Alcohol/week: 4.8 oz    Types: 7 Shots of liquor, 1 Standard drinks or equivalent per week    Frequency: Never    Comment: 1 shot of gin a day.no gin x 1 year since dialysis  . Drug use: No    Allergies  Allergen Reactions  . Eliquis [Apixaban] Other (See Comments)    5 hours of nonstop nasal bleeding.  Required OVN stay in [place].  Occurred 04/2017  . Acyclovir And Related     Current Facility-Administered Medications  Medication Dose Route Frequency Provider Last Rate Last Dose  . ceFAZolin (ANCEF) IVPB 2g/100 mL premix  2 g Intravenous Once Pabon, [place] F, MD      . sodium chloride 0.9 % bolus 250 mL  250 mL Intravenous Once Eula Listen, MD 246 mL/hr at 12/01/17 1942 250 mL at 12/01/17 1942   Current Outpatient Medications  Medication Sig Dispense Refill  . acetaminophen (TYLENOL) 500 MG tablet Take 500 mg by mouth daily as needed for moderate pain or headache.    . ALPRAZolam (XANAX) 0.25 MG tablet Take 0.25 mg by mouth at bedtime as needed for sleep.  2  . amiodarone (PACERONE) 200 MG tablet Take 1 tablet (200 mg total) by mouth daily. 30 tablet 1  . aspirin EC 81 MG EC tablet Take 1 tablet (81 mg total) by mouth daily. 30 tablet 2  . atorvastatin (LIPITOR) 10 MG tablet Take 10 mg by mouth daily.     . furosemide (LASIX) 40 MG tablet Take 1 tablet (40 mg total) by mouth daily. (Patient taking differently: Take 40 mg by mouth 2 (two) times daily. ) 30 tablet 1  . gentamicin cream (GARAMYCIN) 0.1 % Apply 1 application topically daily. Apply to stomach catheter when changing.  99  . KLOR-CON M10 10 MEQ tablet Take 10 mEq by mouth daily.  11  . midodrine (PROAMATINE) 5 MG tablet Take 5 mg by mouth 3 (three) times daily.    . multivitamin (RENA-VIT) TABS tablet Take 1 tablet by mouth daily.  11  . sodium chloride (OCEAN) 0.65 % SOLN nasal spray Place 1 spray into both nostrils as needed for congestion.    . tamsulosin (FLOMAX) 0.4 MG  CAPS capsule Take 0.4 mg by mouth every evening.     Marland Kitchen HYDROcodone-acetaminophen (NORCO) 5-325 MG tablet Take 1-2 tablets by mouth every 6 (six) hours as needed. (Patient not taking: Reported on 08/18/2017) 30 tablet 0     Review of Systems Full ROS  was asked and was negative except for the information on the HPI  Physical Exam Blood pressure 135/69, pulse 78, temperature 98.1 F (36.7 C), temperature source Oral, resp. rate 18, height 5\' 9"  (1.753 m), weight 59 kg (130 lb), SpO2 100 %.  CONSTITUTIONAL: Elderly male . EYES: Pupils are equal, round, and reactive to light, Sclera are non-icteric. EARS, NOSE, MOUTH AND THROAT: The oropharynx is clear. The oral mucosa is pink and moist. Hearing is intact to voice. LYMPH NODES:  Lymph nodes in the neck are normal. RESPIRATORY:  Lungs are clear. There is normal respiratory effort, with equal breath sounds bilaterally, and without pathologic use of accessory muscles. CARDIOVASCULAR: Heart is regular without murmurs, gallops, or rubs. GI: There is a ventral hernia, incarcerated, with skin decoloration, PD catheter in place. GU: Rectal deferred.   MUSCULOSKELETAL: Normal muscle strength and tone. No cyanosis or edema.   SKIN: Turgor is good and there are no pathologic skin lesions or ulcers. NEUROLOGIC: Motor and sensation is grossly normal. Cranial nerves are grossly intact. PSYCH:  Oriented to person, place and time. Affect is normal.  Data Reviewed I have personally reviewed the patient's imaging, laboratory findings and medical records.    Assessment/Plan strrangulated recurrent ventral hernia.  Discussed with the patient and family in detail.  It Is a difficult circumstance given the emergency nature of his disease process.  We are unable to reduce his hernia and there is evidence of an incarcerated bowel and  strangulation.  I have discussed with the patient and the family detail about options  Options: Surgery being the best choice, with  this  having and carrying significant risk of morbidity and mortality including but not limited to: Bleeding, infection, recurrence, prolonged hospitalization, ventilatory support, even death. Alternatively we can offer him NG tube and conservative management with I do not really recommend and will only do it if the patient refuses surgery.  After a  lengthy discussion with the patient the family they wish to proceed and they are willing to take the risk.  They do understand that this is emergency surgery and given his comorbidity and his age there is a significant morbidity and even mortality associated with this. D/W anesthesiologist and ER Attending in detail  All questions were answered. Caroleen Hamman, MD FACS General Surgeon 12/01/2017, 8:39 PM

## 2017-12-01 NOTE — Anesthesia Post-op Follow-up Note (Signed)
Anesthesia QCDR form completed.        

## 2017-12-01 NOTE — ED Notes (Signed)
Pt  being transported to OR.

## 2017-12-01 NOTE — ED Triage Notes (Addendum)
Pt to ED after PCP sent him for a CT scan of his abd,. Pt has swelling around his cath site with distention to his lower abd. Pt also been having NV that started yesterday family stated that PCP reported fear that "tissue had been caught in the mesh of an old hernia repair. Pt had a fever at his PCP of 101.6 but is afebrile in ED. No tylenol taken today. NO changes in BM. Pain upon palpation of the abd and umbilicus.

## 2017-12-01 NOTE — Transfer of Care (Signed)
Immediate Anesthesia Transfer of Care Note  Patient: Ruben Sasso Sr.  Procedure(s) Performed: HERNIA REPAIR VENTRAL ADULT (N/A Abdomen) possible SMALL BOWEL RESECTION (N/A )  Patient Location: PACU  Anesthesia Type:General  Level of Consciousness: alert  and patient cooperative  Airway & Oxygen Therapy: Patient Spontanous Breathing and Patient connected to face mask oxygen  Post-op Assessment: Report given to RN and Post -op Vital signs reviewed and stable  Post vital signs: Reviewed and stable  Last Vitals:  Vitals Value Taken Time  BP 143/94 12/01/2017  9:57 PM  Temp 36.9 C 12/01/2017  9:55 PM  Pulse 112 12/01/2017  9:58 PM  Resp 38 12/01/2017  9:58 PM  SpO2 98 % 12/01/2017  9:58 PM  Vitals shown include unvalidated device data.  Last Pain:  Vitals:   12/01/17 1619  TempSrc:   PainSc: 7          Complications: No apparent anesthesia complications

## 2017-12-01 NOTE — Progress Notes (Signed)
Patient initially (2215) seen in pacu. Patient was a reintubation post op. No difficulty with intubation per CRNA. 7.5 et tube 21 at lip. BBS noted per RT. Color change noted on end tidal co2 detector. Placed patient on vent setting vt 450 rr 14 40% peep 5. Verified orders with receiving CCU NP M. Tukov. Patient tolerated interventions well.

## 2017-12-01 NOTE — Op Note (Signed)
Ventral Hernia Repair (primary)  Pre-operative Diagnosis: Incarcerated ventral hernia  Post-operative Diagnosis: same  Surgeon: Caroleen Hamman, MD FACS  Anesthesia: Gen. with endotracheal tube   Findings:  Incarcerated ventral hernia w incarcerated small bowel, some edema of the SB but no ischemia or perforation  Estimated Blood Loss: 5cc         Drains: none                Complications: none              Procedure Details  The patient was seen again in the Holding Room. The benefits, complications, treatment options, and expected outcomes were discussed with the patient. The risks of bleeding, infection, recurrence of symptoms, failure to resolve symptoms, bowel injury, mesh placement, mesh infection, any of which could require further surgery were reviewed with the patient. The likelihood of improving the patient's symptoms with return to their baseline status is good.  The patient and/or family concurred with the proposed plan, giving informed consent.  The patient was taken to Operating Room, identified as Ruben Hudson. and the procedure verified.  A Time Out was held and the above information confirmed.  Prior to the induction of general anesthesia, antibiotic prophylaxis was administered. VTE prophylaxis was in place. General endotracheal anesthesia was then administered and tolerated well. After the induction, the abdomen was prepped with Chloraprep and draped in the sterile fashion. The patient was positioned in the supine position.   Incision was created with a scalpel over the hernia defect. Electrocautery was used to dissect through subcutaneous tissue, the hernia sac was opened and  The fascia was incised to allow the SB to be reduced. The bowel was examined and found to have edema but no evidence of ischemia or perforation. The bowel was pushed back to the abdominal cavity  I closed the hernia defect with interrupted 0 Ethibond sutures.    Incisions were closed in a 2  layer fashion with 3-0 Vicryl and 4-0 Monocryl. Dermabond was used to coat the skin. Marcaine quarter percent with epinephrine and lidocaine 1% was used to inject all the incision sites.  Patient tolerated procedure well and there were no immediate complications. Needle and laparotomy counts were correct   Caroleen Hamman, MD, FACS

## 2017-12-01 NOTE — Anesthesia Procedure Notes (Signed)
Performed by: Lendon Colonel, CRNA

## 2017-12-01 NOTE — Anesthesia Procedure Notes (Signed)
Procedure Name: Intubation Date/Time: 12/01/2017 10:15 PM Performed by: Lendon Colonel, CRNA Pre-anesthesia Checklist: Patient identified, Patient being monitored, Timeout performed, Emergency Drugs available and Suction available Oxygen Delivery Method: Ambu bag Preoxygenation: Pre-oxygenation with 100% oxygen Induction Type: IV induction Ventilation: Mask ventilation without difficulty Laryngoscope Size: Miller and 3 Grade View: Grade I Tube type: Oral Tube size: 7.0 mm Number of attempts: 1 Airway Equipment and Method: Stylet Placement Confirmation: ETT inserted through vocal cords under direct vision,  positive ETCO2 and breath sounds checked- equal and bilateral Secured at: 21 cm Tube secured with: Tape Dental Injury: Teeth and Oropharynx as per pre-operative assessment

## 2017-12-01 NOTE — Progress Notes (Signed)
[place] Progress Note Patient Name: Ruben Sooy Sr. DOB: 1926-04-23 MRN: [id number]   Date of Service  12/01/2017  HPI/Events of Note  84 yo male s/p ex lap for repair of incarcerated umbilical hernia. Failed extubation post op. Now reintubated and ventilated. PCCM asked to help manage mechanical ventilation. VSS.   eICU Interventions  No new orders.      Intervention Category Evaluation Type: New Patient Evaluation  Lysle Dingwall 12/01/2017, 11:44 PM

## 2017-12-01 NOTE — Anesthesia Procedure Notes (Signed)
Procedure Name: Intubation Date/Time: 12/01/2017 9:04 PM Performed by: Lendon Colonel, CRNA Pre-anesthesia Checklist: Suction available, Emergency Drugs available, Patient identified and Patient being monitored Patient Re-evaluated:Patient Re-evaluated prior to induction Oxygen Delivery Method: Circle system utilized Preoxygenation: Pre-oxygenation with 100% oxygen Induction Type: IV induction, Rapid sequence and Cricoid Pressure applied Laryngoscope Size: Miller and 2 Grade View: Grade I Tube type: Oral Tube size: 7.0 mm Number of attempts: 1 Airway Equipment and Method: Stylet Placement Confirmation: ETT inserted through vocal cords under direct vision,  positive ETCO2 and breath sounds checked- equal and bilateral Secured at: 21 cm Tube secured with: Tape Dental Injury: Teeth and Oropharynx as per pre-operative assessment

## 2017-12-01 NOTE — Progress Notes (Signed)
Lab called with critical troponin of 0.06. Dr. Ronelle Nigh notified.

## 2017-12-02 ENCOUNTER — Inpatient Hospital Stay: Payer: Medicare Other

## 2017-12-02 ENCOUNTER — Encounter: Payer: Self-pay | Admitting: Surgery

## 2017-12-02 DIAGNOSIS — J9601 Acute respiratory failure with hypoxia: Secondary | ICD-10-CM

## 2017-12-02 DIAGNOSIS — R109 Unspecified abdominal pain: Secondary | ICD-10-CM

## 2017-12-02 DIAGNOSIS — K420 Umbilical hernia with obstruction, without gangrene: Secondary | ICD-10-CM

## 2017-12-02 DIAGNOSIS — Z992 Dependence on renal dialysis: Secondary | ICD-10-CM

## 2017-12-02 DIAGNOSIS — K436 Other and unspecified ventral hernia with obstruction, without gangrene: Principal | ICD-10-CM

## 2017-12-02 DIAGNOSIS — N186 End stage renal disease: Secondary | ICD-10-CM

## 2017-12-02 DIAGNOSIS — I10 Essential (primary) hypertension: Secondary | ICD-10-CM

## 2017-12-02 LAB — COMPREHENSIVE METABOLIC PANEL
ALK PHOS: 68 U/L (ref 38–126)
ALT: 19 U/L (ref 0–44)
ANION GAP: 9 (ref 5–15)
AST: 29 U/L (ref 15–41)
Albumin: 3.1 g/dL — ABNORMAL LOW (ref 3.5–5.0)
BILIRUBIN TOTAL: 0.8 mg/dL (ref 0.3–1.2)
BUN: 40 mg/dL — ABNORMAL HIGH (ref 8–23)
CALCIUM: 8.6 mg/dL — AB (ref 8.9–10.3)
CO2: 30 mmol/L (ref 22–32)
Chloride: 105 mmol/L (ref 98–111)
Creatinine, Ser: 3.29 mg/dL — ABNORMAL HIGH (ref 0.61–1.24)
GFR, EST AFRICAN AMERICAN: 18 mL/min — AB (ref >60)
GFR, EST NON AFRICAN AMERICAN: 15 mL/min — AB (ref >60)
Glucose, Bld: 135 mg/dL — ABNORMAL HIGH (ref 70–99)
POTASSIUM: 4.8 mmol/L (ref 3.5–5.1)
Sodium: 144 mmol/L (ref 135–145)
TOTAL PROTEIN: 6.8 g/dL (ref 6.5–8.1)

## 2017-12-02 LAB — CBC
HCT: 37.2 % — ABNORMAL LOW (ref 40.0–52.0)
Hemoglobin: 12.5 g/dL — ABNORMAL LOW (ref 13.0–18.0)
MCH: 33.3 pg (ref 26.0–34.0)
MCHC: 33.7 g/dL (ref 32.0–36.0)
MCV: 98.7 fL (ref 80.0–100.0)
PLATELETS: 176 10*3/uL (ref 150–440)
RBC: 3.77 MIL/uL — AB (ref 4.40–5.90)
RDW: 16.5 % — AB (ref 11.5–14.5)
WBC: 8.9 10*3/uL (ref 3.8–10.6)

## 2017-12-02 LAB — BLOOD GAS, ARTERIAL
Acid-Base Excess: 4.2 mmol/L — ABNORMAL HIGH (ref 0.0–2.0)
BICARBONATE: 29.2 mmol/L — AB (ref 20.0–28.0)
FIO2: 0.4
MECHANICAL RATE: 14
MECHVT: 450 mL
O2 Saturation: 99.8 %
PEEP: 5 cmH2O
Patient temperature: 37
pCO2 arterial: 44 mmHg (ref 32.0–48.0)
pH, Arterial: 7.43 (ref 7.350–7.450)
pO2, Arterial: 209 mmHg — ABNORMAL HIGH (ref 83.0–108.0)

## 2017-12-02 LAB — MRSA PCR SCREENING: MRSA by PCR: NEGATIVE

## 2017-12-02 LAB — GLUCOSE, CAPILLARY: GLUCOSE-CAPILLARY: 127 mg/dL — AB (ref 70–99)

## 2017-12-02 LAB — BRAIN NATRIURETIC PEPTIDE: B Natriuretic Peptide: 1553 pg/mL — ABNORMAL HIGH (ref 0.0–100.0)

## 2017-12-02 LAB — PHOSPHORUS: Phosphorus: 6.4 mg/dL — ABNORMAL HIGH (ref 2.5–4.6)

## 2017-12-02 LAB — MAGNESIUM: MAGNESIUM: 2.5 mg/dL — AB (ref 1.7–2.4)

## 2017-12-02 MED ORDER — NOREPINEPHRINE 4 MG/250ML-% IV SOLN: 0.0000 ug/min | INTRAVENOUS | Status: DC

## 2017-12-02 MED ORDER — MIDODRINE HCL 5 MG PO TABS
5.0000 mg | ORAL_TABLET | Freq: Three times a day (TID) | ORAL | Status: DC
  Administered 2017-12-04 (×3): 5 mg
  Filled 2017-12-02 (×10): qty 1

## 2017-12-02 MED ORDER — AMIODARONE HCL 200 MG PO TABS
200.0000 mg | ORAL_TABLET | Freq: Every day | ORAL | Status: DC
  Administered 2017-12-04: 200 mg
  Filled 2017-12-02 (×2): qty 1

## 2017-12-02 MED ORDER — CHLORHEXIDINE GLUCONATE 0.12 % MT SOLN
15.0000 mL | Freq: Two times a day (BID) | OROMUCOSAL | Status: DC
  Administered 2017-12-02 – 2017-12-06 (×7): 15 mL via OROMUCOSAL
  Filled 2017-12-02 (×6): qty 15

## 2017-12-02 MED ORDER — NALOXONE HCL 0.4 MG/ML IJ SOLN
0.4000 mg | INTRAMUSCULAR | Status: DC | PRN
  Administered 2017-12-02: 0.4 mg via INTRAVENOUS

## 2017-12-02 MED ORDER — CEFAZOLIN SODIUM-DEXTROSE 1-4 GM/50ML-% IV SOLN
1.0000 g | INTRAVENOUS | Status: AC
  Administered 2017-12-03: 1 g via INTRAVENOUS
  Filled 2017-12-02: qty 50

## 2017-12-02 MED ORDER — MORPHINE SULFATE (PF) 2 MG/ML IV SOLN
0.5000 mg | INTRAVENOUS | Status: DC | PRN
  Administered 2017-12-02 – 2017-12-03 (×3): 0.5 mg via INTRAVENOUS
  Filled 2017-12-02 (×3): qty 1

## 2017-12-02 MED ORDER — FAMOTIDINE 20 MG PO TABS: 20.0000 mg | ORAL_TABLET | Freq: Every day | ORAL | Status: DC

## 2017-12-02 MED ORDER — FAMOTIDINE 20 MG PO TABS: 20.0000 mg | ORAL_TABLET | Freq: Two times a day (BID) | ORAL | Status: DC

## 2017-12-02 MED ORDER — ONDANSETRON HCL 4 MG/2ML IJ SOLN
4.0000 mg | Freq: Four times a day (QID) | INTRAMUSCULAR | Status: DC | PRN
  Administered 2017-12-03: 4 mg via INTRAVENOUS
  Filled 2017-12-02: qty 2

## 2017-12-02 MED ORDER — ASPIRIN 81 MG PO CHEW
81.0000 mg | CHEWABLE_TABLET | Freq: Every day | ORAL | Status: DC
  Administered 2017-12-04: 81 mg
  Filled 2017-12-02 (×2): qty 1

## 2017-12-02 MED ORDER — HEPARIN SODIUM (PORCINE) 5000 UNIT/ML IJ SOLN: 5000.0000 [IU] | Freq: Three times a day (TID) | INTRAMUSCULAR | Status: DC

## 2017-12-02 MED ORDER — ORAL CARE MOUTH RINSE
15.0000 mL | Freq: Two times a day (BID) | OROMUCOSAL | Status: DC
  Administered 2017-12-02 – 2017-12-04 (×3): 15 mL via OROMUCOSAL

## 2017-12-02 MED ORDER — FAMOTIDINE IN NACL 20-0.9 MG/50ML-% IV SOLN
20.0000 mg | Freq: Every day | INTRAVENOUS | Status: DC
  Administered 2017-12-02 – 2017-12-04 (×3): 20 mg via INTRAVENOUS
  Filled 2017-12-02 (×3): qty 50

## 2017-12-02 MED ORDER — NALOXONE HCL 0.4 MG/ML IJ SOLN
INTRAMUSCULAR | Status: AC
  Administered 2017-12-02: 0.4 mg via INTRAVENOUS
  Filled 2017-12-02: qty 1

## 2017-12-02 NOTE — Anesthesia Postprocedure Evaluation (Signed)
Anesthesia Post Note  Patient: Ruben Deeb Sr.  Procedure(s) Performed: HERNIA REPAIR VENTRAL ADULT (N/A Abdomen) possible SMALL BOWEL RESECTION (N/A )  Patient location during evaluation: ICU Anesthesia Type: General Level of consciousness: sedated Pain management: pain level controlled Vital Signs Assessment: post-procedure vital signs reviewed and stable Respiratory status: patient re-intubated Cardiovascular status: stable Postop Assessment: no apparent nausea or vomiting Anesthetic complications: no     Last Vitals:  Vitals:   12/02/17 0600 12/02/17 0630  BP:    Pulse: 70 62  Resp: 15 16  Temp:    SpO2: 100% 100%    Last Pain:  Vitals:   12/02/17 0430  TempSrc: Axillary  PainSc:                  [name]

## 2017-12-02 NOTE — Progress Notes (Signed)
Extubated to 2lnc without complications

## 2017-12-02 NOTE — Consult Note (Signed)
[facility] Consult Note  MRN : [id number]  Ruben Salvato Sr. is a 84 y.o. (02/27/1926) male who presents with chief complaint of  Chief Complaint  Patient presents with  . Abdominal Pain  .  History of Present Illness:   I am asked to see the patient by Dr. Holley Raring for evaluation of his dialysis access.  The patient is an 84 year old gentleman who is been maintained and done fairly well on peritoneal dialysis.  Approximately 2-1/2 days ago the patient reported the abrupt onset of abdominal pain.  The pain was point specific located in the umbilicus.  It increased in nature going from intermittent to constant.  He also noted that his bellybutton was now protruding outward and was a slightly bluish color.  The patient denies fever chills.  He denies diarrhea.  He reports dialysis has been going well.  In the emergency room CT scan showed an incarcerated ventral hernia.  Patient does have a history of ventral hernia repair with mesh in the 90s.  He was subsequently taken to surgery where a primary ventral hernia repair was performed.  Given this fact he will not be able to continue peritoneal dialysis in the short-term and therefore will will need alternative access.  Postoperatively he had to be reintubated but at the time of my interview this afternoon June 04, 2017 he is extubated sitting up and conversing.  Current Facility-Administered Medications  Medication Dose Route Frequency Provider Last Rate Last Dose  . ALPRAZolam (XANAX) tablet 0.25 mg  0.25 mg Oral QHS PRN Pabon, Diego F, MD      . amiodarone (PACERONE) tablet 200 mg  200 mg Per Tube Daily Pabon, Diego F, MD      . aspirin chewable tablet 81 mg  81 mg Per Tube Daily Pabon, Diego F, MD      . bisacodyl (DULCOLAX) suppository 10 mg  10 mg Rectal Daily PRN Tukov-Yual, Magdalene S, NP      . chlorhexidine (PERIDEX) 0.12 % solution 15 mL  15 mL Mouth Rinse BID Flora Lipps, MD      .  famotidine (PEPCID) IVPB 20 mg premix  20 mg Intravenous Daily Flora Lipps, MD   Stopped at 12/02/17 1140  . fentaNYL (SUBLIMAZE) injection 50 mcg  50 mcg Intravenous Once Tukov-Yual, Magdalene S, NP   Stopped at 12/01/17 2344  . heparin injection 5,000 Units  5,000 Units Subcutaneous Q8H Jules Husbands, MD   5,000 Units at 12/02/17 [phone]  . hydrALAZINE (APRESOLINE) injection 10 mg  10 mg Intravenous Q2H PRN Pabon, Diego F, MD      . ipratropium-albuterol (DUONEB) 0.5-2.5 (3) MG/3ML nebulizer solution 3 mL  3 mL Nebulization Q6H Tukov-Yual, Magdalene S, NP   3 mL at 12/02/17 1324  . MEDLINE mouth rinse  15 mL Mouth Rinse q12n4p Flora Lipps, MD      . midodrine (PROAMATINE) tablet 5 mg  5 mg Per Tube TID Pabon, Diego F, MD      . morphine 2 MG/ML injection 0.5 mg  0.5 mg Intravenous Q1H PRN Flora Lipps, MD      . naloxone (NARCAN) injection 0.4 mg  0.4 mg Intravenous PRN Flora Lipps, MD   0.4 mg at 12/02/17 1056  . norepinephrine (LEVOPHED) 4mg  in D5W 231mL premix infusion  0-20 mcg/min Intravenous Titrated Tukov-Yual, Arlyss Gandy, NP   Stopped at 12/02/17 0442  . ondansetron (ZOFRAN) injection 4 mg  4 mg Intravenous Q6H PRN Flora Lipps, MD      .  ondansetron (ZOFRAN-ODT) disintegrating tablet 4 mg  4 mg Oral Q6H PRN Pabon, Diego F, MD      . sennosides (SENOKOT) 8.8 MG/5ML syrup 5 mL  5 mL Per Tube BID PRN Tukov-Yual, Magdalene S, NP      . tamsulosin (FLOMAX) capsule 0.4 mg  0.4 mg Oral QPM Jules Husbands, MD   Stopped at 12/01/17 2344    Past Medical History:  Diagnosis Date  . Acute pulmonary edema ([place]) 02/26/2013  . Anxiety   . Arthritis    little  . Benign prostatic hyperplasia with urinary obstruction 02/24/2012  . BP (high blood pressure) 10/31/2015  . Cataract extraction status of left eye   . Cataract extraction status of right eye   . Cerebrovascular accident (CVA) ([place]) 04/24/2012   affected right eye, limited peripheral vision  . Chronic kidney disease 07/25/2017   stage 3   . Congestive heart failure ([place]) 10/31/2015  . Dyspnea    uses continuous o2  . Dysrhythmia    AF  . HLD (hyperlipidemia) 10/31/2015  . Hydrocele 02/25/2012  . Myocardial infarction ([place])    per patient, small MI  . Renal insufficiency   . Unstable angina pectoris ([place]) 02/26/2013  . Urge incontinence of urine 05/30/2014  . Urinary urgency 10/31/2015    Past Surgical History:  Procedure Laterality Date  . ANKLE FRACTURE SURGERY Right 2006   metal in ankle, screws and plate  . BOWEL RESECTION N/A 12/01/2017   Procedure: possible SMALL BOWEL RESECTION;  Surgeon: Jules Husbands, MD;  Location: [facility];  Service: General;  Laterality: N/A;  . CAPD INSERTION N/A 08/01/2017   Procedure: LAPAROSCOPIC INSERTION CONTINUOUS AMBULATORY PERITONEAL DIALYSIS  (CAPD) CATHETER;  Surgeon: Katha Cabal, MD;  Location: [facility];  Service: Vascular;  Laterality: N/A;  . CIRCUMCISION N/A 11/13/2015   Procedure: CIRCUMCISION ADULT;  Surgeon: Hollice Espy, MD;  Location: [facility];  Service: Urology;  Laterality: N/A;  . DIALYSIS/PERMA CATHETER INSERTION N/A 03/17/2017   Procedure: DIALYSIS/PERMA CATHETER INSERTION;  Surgeon: Algernon Huxley, MD;  Location: [facility];  Service: Cardiovascular;  Laterality: N/A;  . DIALYSIS/PERMA CATHETER REMOVAL N/A 09/18/2017   Procedure: DIALYSIS/PERMA CATHETER REMOVAL;  Surgeon: Algernon Huxley, MD;  Location: [place];  Service: Cardiovascular;  Laterality: N/A;  . EYE SURGERY Bilateral    cataract extractions  . HERNIA REPAIR    . HYDROCELE EXCISION Bilateral 11/13/2015   Procedure: HYDROCELECTOMY ADULT;  Surgeon: Hollice Espy, MD;  Location: [facility];  Service: Urology;  Laterality: Bilateral;  . TONSILLECTOMY  1950's  . TONSILLECTOMY    . [place]   with mesh  . VARICOCELECTOMY  1948  . VASECTOMY    . VENTRAL HERNIA REPAIR N/A 12/01/2017   Procedure: HERNIA REPAIR VENTRAL ADULT;  Surgeon: Jules Husbands, MD;  Location: [facility]  [facility];  Service: General;  Laterality: N/A;    Social History Social History   Tobacco Use  . Smoking status: Former Smoker    Packs/day: 1.00    Years: 20.00    Pack years: 20.00    Types: Cigarettes    Last attempt to quit: 11/03/1954    Years since quitting: 63.1  . Smokeless tobacco: Never Used  . Tobacco comment: started smoking at 84 years old  Substance Use Topics  . Alcohol use: No    Alcohol/week: 4.8 oz    Types: 7 Shots of liquor, 1 Standard drinks or equivalent per week  Frequency: Never    Comment: 1 shot of gin a day.no gin x 1 year since dialysis  . Drug use: No    Family History Family History  Problem Relation Age of Onset  . Kidney cancer Neg Hx   . Prostate cancer Neg Hx   No family history of bleeding/clotting disorders, porphyria or autoimmune disease   Allergies  Allergen Reactions  . Eliquis [Apixaban] Other (See Comments)    5 hours of nonstop nasal bleeding.  Required OVN stay in [place].  Occurred 04/2017  . Acyclovir And Related      REVIEW OF SYSTEMS (Negative unless checked)  Constitutional: [] Weight loss  [] Fever  [] Chills Cardiac: [] Chest pain   [] Chest pressure   [] Palpitations   [] Shortness of breath when laying flat   [] Shortness of breath at rest   [] Shortness of breath with exertion. Vascular:  [] Pain in legs with walking   [] Pain in legs at rest   [] Pain in legs when laying flat   [] Claudication   [] Pain in feet when walking  [] Pain in feet at rest  [] Pain in feet when laying flat   [] History of DVT   [] Phlebitis   [] Swelling in legs   [] Varicose veins   [] Non-healing ulcers Pulmonary:   [] Uses home oxygen   [] Productive cough   [] Hemoptysis   [] Wheeze  [] COPD   [] Asthma Neurologic:  [] Dizziness  [] Blackouts   [] Seizures   [] History of stroke   [] History of TIA  [] Aphasia   [] Temporary blindness   [] Dysphagia   [] Weakness or numbness in arms   [] Weakness or numbness in legs Musculoskeletal:  [] Arthritis   [] Joint swelling    [] Joint pain   [] Low back pain Hematologic:  [] Easy bruising  [] Easy bleeding   [] Hypercoagulable state   [] Anemic  [] Hepatitis Gastrointestinal:  [] Blood in stool   [] Vomiting blood  [] Gastroesophageal reflux/heartburn   [] Difficulty swallowing. Genitourinary:  [x] Chronic kidney disease   [] Difficult urination  [] Frequent urination  [] Burning with urination   [] Blood in urine Skin:  [] Rashes   [] Ulcers   [] Wounds Psychological:  [] History of anxiety   []  History of major depression.  Physical Examination  Vitals:   12/02/17 1100 12/02/17 1200 12/02/17 1300 12/02/17 1400  BP: (!) 115/56 (!) 120/53 (!) 117/56 116/60  Pulse: 77 76 75 75  Resp: 17 17 (!) 21 15  Temp:      TempSrc:      SpO2: 98% 96% 97% 98%  Weight:      Height:       Body mass index is 18.39 kg/m. Gen:  WD/WN, NAD Head: [place]/AT, No temporalis wasting. Prominent temp pulse not noted. Ear/Nose/Throat: Hearing grossly intact, nares NG tube present, oropharynx w/o Erythema/Exudate Eyes: Sclera non-icteric, conjunctiva clear Neck: Trachea midline.  No JVD.  Pulmonary:  Good air movement, respirations not labored, equal bilaterally.  Cardiac: RRR, normal S1, S2. Vascular:  Vessel Right Left  Radial Palpable Palpable  Gastrointestinal: soft, appropriately tender/non-distended. mild guarding/reflex. PD catheter in place Musculoskeletal: M/S 5/5 throughout.  Extremities without ischemic changes.  No deformity or atrophy. No edema. Neurologic: Sensation grossly intact in extremities.  Symmetrical.  Speech is fluent. Motor exam as listed above. Psychiatric: Judgment intact, Mood & affect appropriate for pt's clinical situation. Dermatologic: No rashes or ulcers noted.  No cellulitis or open wounds. Lymph : No Cervical, Axillary, or Inguinal lymphadenopathy.      CBC Lab Results  Component Value Date   WBC 8.9 12/02/2017   HGB  12.5 (L) 12/02/2017   HCT 37.2 (L) 12/02/2017   MCV 98.7 12/02/2017   PLT 176 12/02/2017     BMET    Component Value Date/Time   NA 144 12/02/2017 0607   NA 140 05/30/2012 0540   K 4.8 12/02/2017 0607   K 3.7 05/30/2012 0540   CL 105 12/02/2017 0607   CL 105 05/30/2012 0540   CO2 30 12/02/2017 0607   CO2 28 05/30/2012 0540   GLUCOSE 135 (H) 12/02/2017 0607   GLUCOSE 94 05/30/2012 0540   BUN 40 (H) 12/02/2017 0607   BUN 30 (H) 05/30/2012 0540   CREATININE 3.29 (H) 12/02/2017 0607   CREATININE 1.63 (H) 05/30/2012 0540   CALCIUM 8.6 (L) 12/02/2017 0607   CALCIUM 8.6 05/30/2012 0540   GFRNONAA 15 (L) 12/02/2017 0607   GFRNONAA 38 (L) 05/30/2012 0540   GFRAA 18 (L) 12/02/2017 0607   GFRAA 44 (L) 05/30/2012 0540   Estimated Creatinine Clearance: 12.9 mL/min (A) (by C-G formula based on SCr of 3.29 mg/dL (H)).  COAG Lab Results  Component Value Date   INR 1.06 12/01/2017   INR 1.09 07/25/2017   INR 1.47 03/20/2017    Radiology Dg Chest 2 View  Result Date: 12/01/2017 CLINICAL DATA:  Nausea and vomiting since yesterday, swelling around calf site, distension to lower abdomen; history hypertension, CHF, stroke, post MI, former smoker, stage III chronic kidney disease EXAM: CHEST - 2 VIEW COMPARISON:  08/01/2017 FINDINGS: Enlargement of cardiac silhouette with pulmonary vascular congestion. Atherosclerotic calcification aorta. Minimal peribronchial thickening with small BILATERAL pleural effusions and mild pulmonary edema, improved since previous exam. Minimal bibasilar atelectasis. No pneumothorax. Bones demineralized. IMPRESSION: Improved CHF with decreased bibasilar pleural effusions and atelectasis. Electronically Signed   By: Lavonia Dana M.D.   On: 12/01/2017 17:52   Ct Abdomen Pelvis W Contrast  Result Date: 12/01/2017 CLINICAL DATA:  84 year old male with lower abdominal distension and swelling at peritoneal dialysis catheter site. Nausea vomiting since yesterday. EXAM: CT ABDOMEN AND PELVIS WITH CONTRAST TECHNIQUE: Multidetector CT imaging of the abdomen and pelvis  was performed using the standard protocol following bolus administration of intravenous contrast. CONTRAST:  50mL OMNIPAQUE IOHEXOL 300 MG/ML  SOLN COMPARISON:  Noncontrast CT Abdomen and Pelvis 04/19/2017 and earlier. FINDINGS: Lower chest: Small bilateral pleural effusions are layering and subpulmonic and regressed compared to November 2018. Cardiomegaly appears stable. No pericardial effusion. Lung base atelectasis without consolidation. Hepatobiliary: Dependent sludge or vicarious contrast excretion to the gallbladder. Negative liver. No biliary ductal enlargement. Pancreas: Negative. Spleen: Negative. Adrenals/Urinary Tract: Normal adrenal glands. There is a degree of native renal atrophy, but symmetric bilateral renal enhancement and contrast excretion. No hydronephrosis or hydroureter. Diminutive and unremarkable urinary bladder. Stomach/Bowel: Partially decompressed rectosigmoid colon, which might account for the appearance of mild sigmoid wall thickening on series 2, image 58. Mild redundancy at the junction of the sigmoid and descending colon. The left colon has a somewhat medial course. Retained stool throughout the transverse and right colon. The hepatic flexure is mildly redundant. The terminal ileum is decompressed along with most small bowel loops in the pelvis. However, there are dilated proximal and mid small bowel loops which are fluid-filled and measure up to 34 millimeters diameter. An abrupt transition occurs at a small umbilical hernia which contains a knuckle of small bowel best seen on sagittal image 51, along with a small volume of free fluid. From here the downstream loops are decompressed and the upstream loops are dilated. This hernia occurs  just superior and medial to the left PD catheter percutaneous site, which appears unrelated. The proximal stomach is also dilated with fluid although the distal stomach and duodenum are relatively decompressed. No abdominal free air. Trace free fluid  elsewhere. Vascular/Lymphatic: Aortoiliac calcified atherosclerosis, with atherosclerosis of the major aortic branches. Major arterial structures are patent. Portal venous system is patent. No lymphadenopathy. Reproductive: Prior right inguinal hernia repair with no adverse features. Other: Small volume pelvic free to fluid the peritoneal in proximity dialysis catheter. Musculoskeletal: No acute osseous abnormality identified. IMPRESSION: 1. Small incarcerated umbilical hernia resulting in High-grade Small Bowel Obstruction. This hernia and obstruction appear unrelated to the PD catheter. See sagittal image 51, coronal image 24. 2. No free air. Small volume of free fluid in the abdomen and pelvis which could be reactive due to #1 or peritoneal dialysis related. 3. Small bilateral pleural effusions are regressed compared to February 2018. 4.  Aortic Atherosclerosis (ICD10-I70.0). Electronically Signed   By: Genevie Ann M.D.   On: 12/01/2017 18:16   Portable Chest Xray  Result Date: 12/02/2017 CLINICAL DATA:  Respiratory failure. EXAM: PORTABLE CHEST 1 VIEW COMPARISON:  Radiograph of December 01, 2017. FINDINGS: Stable cardiomegaly. Endotracheal and nasogastric tubes are unchanged in position. Atherosclerosis of thoracic aorta is noted. No pneumothorax is noted. Stable left basilar atelectasis or infiltrate is noted with mild right pleural effusion. Minimal right pleural effusion is noted. Bony thorax is unremarkable. IMPRESSION: Stable support apparatus. Stable left basilar opacity is noted with bilateral pleural effusions. Aortic Atherosclerosis (ICD10-I70.0). Electronically Signed   By: Marijo Conception, M.D.   On: 12/02/2017 07:56   Portable Chest X-ray  Result Date: 12/02/2017 CLINICAL DATA:  Respiratory distress EXAM: PORTABLE CHEST 1 VIEW COMPARISON:  Chest x-ray earlier today FINDINGS: New large volume pneumoperitoneum. The patient is postop for hernia repair. Increased left lower lobe opacity with probable volume  loss. There are small pleural effusions. No pneumothorax. Cardiomegaly. Endotracheal tube tip at the clavicular heads. The orogastric tube is in stable position. IMPRESSION: 1. Endotracheal and orogastric tubes in unremarkable position. 2. Worsening left lower lobe aeration. 3. Small pleural effusions. 4. Pneumoperitoneum in this postoperative patient. Electronically Signed   By: Monte Fantasia M.D.   On: 12/02/2017 00:03   Dg Abd Portable 1 View  Result Date: 12/01/2017 CLINICAL DATA:  Nasogastric tube placement EXAM: PORTABLE ABDOMEN - 1 VIEW COMPARISON:  CT from earlier today FINDINGS: Nasogastric tube tip over the upper stomach with side port at the GE junction. Gas distended small bowel loops with fluid levels as seen on prior CT. Cardiomegaly and small pleural effusions. IMPRESSION: Nasogastric tube tip in good position over the proximal stomach. Electronically Signed   By: Monte Fantasia M.D.   On: 12/01/2017 19:51      Assessment/Plan 1.  End-stage renal disease requiring hemodialysis: The patient will now have an interruption in his peritoneal dialysis secondary to his hernia repair.  Given this fact I will plan to place a tunneled catheter tomorrow.  And he can resume his dialysis without further interruption.  If for whatever reason he is not a candidate for tunnel catheter placement then a temporary catheter will be placed to allow resumption of dialysis and the placement of his permacatheter can be performed at a later date.  Risks and benefits were reviewed all questions were answered patient's wife was in attendance.  All are in agreement with proceeding.  2.  Incarcerated hernia status post repair: Continue NG tube for now plan  per general surgery  3.  Hypertension: Patient is being continuously monitored.  Medications are being appropriately administered given his n.p.o. status.  Plan per the medical service.  Hortencia Pilar, MD  12/02/2017 4:42 PM    This note was  created with Dragon medical transcription system.  Any error is purely unintentional

## 2017-12-02 NOTE — Plan of Care (Signed)
  Problem: Clinical Measurements: Goal: Ability to maintain clinical measurements within normal limits will improve Outcome: Progressing Goal: Will remain free from infection Outcome: Progressing Goal: Diagnostic test results will improve Outcome: Progressing Goal: Respiratory complications will improve Outcome: Progressing Goal: Cardiovascular complication will be avoided Outcome: Progressing   Problem: Coping: Goal: Level of anxiety will decrease Outcome: Progressing   Problem: Elimination: Goal: Will not experience complications related to bowel motility Outcome: Progressing   Problem: Pain Managment: Goal: General experience of comfort will improve Outcome: Progressing   Problem: Safety: Goal: Ability to remain free from injury will improve Outcome: Progressing   Problem: Skin Integrity: Goal: Risk for impaired skin integrity will decrease Outcome: Progressing

## 2017-12-02 NOTE — Progress Notes (Signed)
S/p extubation Placed on [place] Very confused, but resp status stable IV narcan given for chest wall rigidity. Wife at bedside will continue to monitor    Patient/Family are satisfied with Plan of action and management. All questions answered  Corrin Parker, M.D.  [facility]  Medical Director [place] Director [facility] Cardio-Pulmonary Department

## 2017-12-02 NOTE — Progress Notes (Signed)
Patient was extubated this morning.  He is confused. His vital signs are stable.  He is on oxygen 3 L and NG tube. Lung sounds clear, no bowel sounds. Labs reviewed. Keep n.p.o. with IV fluid support.  Follow-up with surgeon for diet. Continue current treatment. I discussed with his wife.  Time spent about 25 minutes.

## 2017-12-02 NOTE — Progress Notes (Signed)
[facility]  ROUNDING NOTE   Subjective:  Patient well known to [place]. Came in with incarcerated ventral hernia. He's now s/p repair of hernia.  Patient still on the vent at the moment. PD catheter in place.   Dr. Dahlia Byes that we avoid PD for at least one week.    Objective:  Vital signs in last 24 hours:  Temp:  [97.5 F (36.4 C)-98.5 F (36.9 C)] 98.4 F (36.9 C) (07/09 0800) Pulse Rate:  [62-117] 66 (07/09 0800) Resp:  [13-32] 17 (07/09 0800) BP: (95-144)/(47-94) 104/55 (07/09 0800) SpO2:  [98 %-100 %] 100 % (07/09 0800) FiO2 (%):  [30 %-100 %] 30 % (07/09 0730) Weight:  [59 kg (130 lb)-59.8 kg (131 lb 13.4 oz)] 59.8 kg (131 lb 13.4 oz) (07/08 2306)  Weight change:  Filed Weights   12/01/17 1620 12/01/17 2306  Weight: 59 kg (130 lb) 59.8 kg (131 lb 13.4 oz)    Intake/Output: I/O last 3 completed shifts: In: 819 [I.V.:800.7; IV Piggyback:18.3] Out: 250 [Urine:250]   Intake/Output this shift:  Total I/O In: 166.3 [I.V.:136.3; NG/GT:30] Out: 20 [Emesis/NG output:20]  Physical Exam: General: Critically illa ppearing  Head: ETT in place  Eyes: Anicteric  Neck: Supple, trachea midline  Lungs:  Clear to auscultation, vent assisted  Heart: S1S2 no rubs  Abdomen:  Soft, small incision noted, C/D/I, PD catheter in place  Extremities: no peripheral edema.  Neurologic: intubated  Skin: No lesions  Access: PD catheter in place    Basic Metabolic Panel: Recent Labs  Lab 12/01/17 1706 12/02/17 0607  NA 143 144  K 4.5 4.8  CL 101 105  CO2 34* 30  GLUCOSE 143* 135*  BUN 35* 40*  CREATININE 3.18* 3.29*  CALCIUM 9.1 8.6*  MG  --  2.5*  PHOS  --  6.4*    Liver Function Tests: Recent Labs  Lab 12/01/17 1706 12/02/17 0607  AST 31 29  ALT 21 19  ALKPHOS 72 68  BILITOT 1.1 0.8  PROT 6.8 6.8  ALBUMIN 3.2* 3.1*   Recent Labs  Lab 12/01/17 1706  LIPASE 34   No results for input(s): AMMONIA in the last 168 hours.  CBC: Recent Labs  Lab  12/01/17 1706 12/02/17 0607  WBC 12.6* 8.9  HGB 12.7* 12.5*  HCT 37.1* 37.2*  MCV 96.9 98.7  PLT 185 176    Cardiac Enzymes: Recent Labs  Lab 12/01/17 1706 12/01/17 1901  TROPONINI 0.05* 0.06*    BNP: Invalid input(s): POCBNP  CBG: Recent Labs  Lab 12/01/17 2325  GLUCAP 147*    Microbiology: Results for orders placed or performed during the hospital encounter of 12/01/17  MRSA PCR Screening     Status: None   Collection Time: 12/01/17 11:32 PM  Result Value Ref Range Status   MRSA by PCR NEGATIVE NEGATIVE Final    Comment:        The GeneXpert MRSA Assay (FDA approved for NASAL specimens only), is one component of a comprehensive MRSA colonization surveillance program. It is not intended to diagnose MRSA infection nor to guide or monitor treatment for MRSA infections. Performed at [facility], [address] [place],  [address]     Coagulation Studies: Recent Labs    12/01/17 1901  LABPROT 13.7  INR 1.06    Urinalysis: No results for input(s): COLORURINE, LABSPEC, PHURINE, GLUCOSEU, HGBUR, BILIRUBINUR, KETONESUR, PROTEINUR, UROBILINOGEN, NITRITE, LEUKOCYTESUR in the last 72 hours.  Invalid input(s): APPERANCEUR    Imaging: Dg  Chest 2 View  Result Date: 12/01/2017 CLINICAL DATA:  Nausea and vomiting since yesterday, swelling around calf site, distension to lower abdomen; history hypertension, CHF, stroke, post MI, former smoker, stage III chronic kidney disease EXAM: CHEST - 2 VIEW COMPARISON:  08/01/2017 FINDINGS: Enlargement of cardiac silhouette with pulmonary vascular congestion. Atherosclerotic calcification aorta. Minimal peribronchial thickening with small BILATERAL pleural effusions and mild pulmonary edema, improved since previous exam. Minimal bibasilar atelectasis. No pneumothorax. Bones demineralized. IMPRESSION: Improved CHF with decreased bibasilar pleural effusions and atelectasis. Electronically Signed   By: Lavonia Dana  M.D.   On: 12/01/2017 17:52   Ct Abdomen Pelvis W Contrast  Result Date: 12/01/2017 CLINICAL DATA:  84 year old male with lower abdominal distension and swelling at peritoneal dialysis catheter site. Nausea vomiting since yesterday. EXAM: CT ABDOMEN AND PELVIS WITH CONTRAST TECHNIQUE: Multidetector CT imaging of the abdomen and pelvis was performed using the standard protocol following bolus administration of intravenous contrast. CONTRAST:  76m OMNIPAQUE IOHEXOL 300 MG/ML  SOLN COMPARISON:  Noncontrast CT Abdomen and Pelvis 04/19/2017 and earlier. FINDINGS: Lower chest: Small bilateral pleural effusions are layering and subpulmonic and regressed compared to November 2018. Cardiomegaly appears stable. No pericardial effusion. Lung base atelectasis without consolidation. Hepatobiliary: Dependent sludge or vicarious contrast excretion to the gallbladder. Negative liver. No biliary ductal enlargement. Pancreas: Negative. Spleen: Negative. Adrenals/Urinary Tract: Normal adrenal glands. There is a degree of native renal atrophy, but symmetric bilateral renal enhancement and contrast excretion. No hydronephrosis or hydroureter. Diminutive and unremarkable urinary bladder. Stomach/Bowel: Partially decompressed rectosigmoid colon, which might account for the appearance of mild sigmoid wall thickening on series 2, image 58. Mild redundancy at the junction of the sigmoid and descending colon. The left colon has a somewhat medial course. Retained stool throughout the transverse and right colon. The hepatic flexure is mildly redundant. The terminal ileum is decompressed along with most small bowel loops in the pelvis. However, there are dilated proximal and mid small bowel loops which are fluid-filled and measure up to 34 millimeters diameter. An abrupt transition occurs at a small umbilical hernia which contains a knuckle of small bowel best seen on sagittal image 51, along with a small volume of free fluid. From here the  downstream loops are decompressed and the upstream loops are dilated. This hernia occurs just superior and medial to the left PD catheter percutaneous site, which appears unrelated. The proximal stomach is also dilated with fluid although the distal stomach and duodenum are relatively decompressed. No abdominal free air. Trace free fluid elsewhere. Vascular/Lymphatic: Aortoiliac calcified atherosclerosis, with atherosclerosis of the major aortic branches. Major arterial structures are patent. Portal venous system is patent. No lymphadenopathy. Reproductive: Prior right inguinal hernia repair with no adverse features. Other: Small volume pelvic free to fluid the peritoneal in proximity dialysis catheter. Musculoskeletal: No acute osseous abnormality identified. IMPRESSION: 1. Small incarcerated umbilical hernia resulting in High-grade Small Bowel Obstruction. This hernia and obstruction appear unrelated to the PD catheter. See sagittal image 51, coronal image 24. 2. No free air. Small volume of free fluid in the abdomen and pelvis which could be reactive due to #1 or peritoneal dialysis related. 3. Small bilateral pleural effusions are regressed compared to February 2018. 4.  Aortic Atherosclerosis (ICD10-I70.0). Electronically Signed   By: HGenevie AnnM.D.   On: 12/01/2017 18:16   Portable Chest Xray  Result Date: 12/02/2017 CLINICAL DATA:  Respiratory failure. EXAM: PORTABLE CHEST 1 VIEW COMPARISON:  Radiograph of December 01, 2017. FINDINGS: Stable cardiomegaly. Endotracheal  and nasogastric tubes are unchanged in position. Atherosclerosis of thoracic aorta is noted. No pneumothorax is noted. Stable left basilar atelectasis or infiltrate is noted with mild right pleural effusion. Minimal right pleural effusion is noted. Bony thorax is unremarkable. IMPRESSION: Stable support apparatus. Stable left basilar opacity is noted with bilateral pleural effusions. Aortic Atherosclerosis (ICD10-I70.0). Electronically Signed   By:  Marijo Conception, M.D.   On: 12/02/2017 07:56   Portable Chest X-ray  Result Date: 12/02/2017 CLINICAL DATA:  Respiratory distress EXAM: PORTABLE CHEST 1 VIEW COMPARISON:  Chest x-ray earlier today FINDINGS: New large volume pneumoperitoneum. The patient is postop for hernia repair. Increased left lower lobe opacity with probable volume loss. There are small pleural effusions. No pneumothorax. Cardiomegaly. Endotracheal tube tip at the clavicular heads. The orogastric tube is in stable position. IMPRESSION: 1. Endotracheal and orogastric tubes in unremarkable position. 2. Worsening left lower lobe aeration. 3. Small pleural effusions. 4. Pneumoperitoneum in this postoperative patient. Electronically Signed   By: Monte Fantasia M.D.   On: 12/02/2017 00:03   Dg Abd Portable 1 View  Result Date: 12/01/2017 CLINICAL DATA:  Nasogastric tube placement EXAM: PORTABLE ABDOMEN - 1 VIEW COMPARISON:  CT from earlier today FINDINGS: Nasogastric tube tip over the upper stomach with side port at the GE junction. Gas distended small bowel loops with fluid levels as seen on prior CT. Cardiomegaly and small pleural effusions. IMPRESSION: Nasogastric tube tip in good position over the proximal stomach. Electronically Signed   By: Monte Fantasia M.D.   On: 12/01/2017 19:51     Medications:   . dextrose 5 % and 0.9% NaCl 50 mL/hr at 12/02/17 0642  . famotidine (PEPCID) IV Stopped (12/02/17 0053)  . fentaNYL infusion INTRAVENOUS 125 mcg/hr (12/02/17 5397)  . norepinephrine (LEVOPHED) Adult infusion Stopped (12/02/17 0442)   . amiodarone  200 mg Oral Daily  . aspirin EC  81 mg Oral Daily  . chlorhexidine gluconate (MEDLINE KIT)  15 mL Mouth Rinse BID  . fentaNYL (SUBLIMAZE) injection  50 mcg Intravenous Once  . heparin  5,000 Units Subcutaneous Q8H  . ipratropium-albuterol  3 mL Nebulization Q6H  . mouth rinse  15 mL Mouth Rinse 10 times per day  . midodrine  5 mg Oral TID  . tamsulosin  0.4 mg Oral QPM    ALPRAZolam, bisacodyl, fentaNYL, hydrALAZINE, HYDROcodone-acetaminophen, midazolam, midazolam, morphine injection, ondansetron **OR** ondansetron (ZOFRAN) IV, sennosides  Assessment/ Plan:  84 y.o. male congestive heart failure, ESRD on peritoneal dialysis, anemia of chronic kidney disease, secondary hyperparathyroidism, hyperlipidemia, history of myocardial infarction who presented with incarcerated ventral hernia and is status post repair.  1.  ESRD on peritoneal dialysis as an outpatient. 2.  Acute respiratory failure postoperative. 3.  Anemia of chronic kidney disease hemoglobin 12.5. 4.  Secondary hyperparathyroidism. 5.  Incarcerated ventral hernia status post repair. 6.  Hypotension.  Plan: Patient well-known to [place] as an outpatient.  He presented with an incarcerated ventral hernia.  He is status post surgical repair.  Difficulty was noted postoperatively as the patient could not be weaned from the ventilator.  He remains on the ventilator at this time.  Case discussed with critical care.  It appears that they will try to wean him this a.m.  Case also discussed with surgery this a.m.  It is recommended that we avoid peritoneal dialysis for at least 1 week.  As such we will consult vascular surgery for PermCath placement.  Once PermCath is in place we can proceed  with hemodialysis.  If his weaning is prolonged we may need to place a Vas-Cath first.  Further plan as per patient progress.   LOS: 1 Ruben Hudson 7/9/20199:02 AM

## 2017-12-02 NOTE — Progress Notes (Signed)
PHARMACIST - PHYSICIAN COMMUNICATION  CONCERNING: IV to Oral Route Change Policy  RECOMMENDATION: This patient is receiving famotidine by the intravenous route.  Based on criteria approved by the Pharmacy and Therapeutics Committee, the intravenous medication(s) is/are being converted to the equivalent oral dose form(s).   DESCRIPTION: These criteria include:  The patient is eating (either orally or via tube) and/or has been taking other orally administered medications for a least 24 hours  The patient has no evidence of active gastrointestinal bleeding or impaired GI absorption (gastrectomy, short bowel, patient on TNA or NPO).  If you have questions about this conversion, please contact the Pharmacy Department  []   [phone] )  [facility] [x]   [phone] )  [facility] []   [phone] )  [facility] []   [phone] )  [facility] []   [phone] )  [place], [facility] 12/02/2017 9:34 AM

## 2017-12-02 NOTE — Consult Note (Signed)
[place] Consultation  [name] ZDG:[id number] DOB: 10/13/1925 DOA: 12/01/2017 PCP: Kirk Ruths, MD   Requesting physician: Dr Dahlia Byes Date of consultation: 12/01/17 Reason for consultation: CHF, HTN Management  CHIEF COMPLAINT:   Chief Complaint  Patient presents with  . Abdominal Pain    HISTORY OF PRESENT ILLNESS: Ruben Hudson  is a 84 y.o. male with a known history of CHF, end-stage renal disease on peritoneal dialysis, hypertension and other comorbidities. Patient is currently intubated status post surgery, unable to provide medical history.  Information was taken from reviewing the medical chart and from discussion with emergency room physician. Patient was brought to emergency room with fever, abdominal pain and distention going on for the past 4 days, gradually getting worse. Blood test done emergency room are remarkable for potassium level of 4.5, creatinine level at 3.1, albumin level at 3.2.  Troponin level is elevated at 0.05 and WBC count is elevated at 12,000. EKG shows normal sinus rhythm with heart rate at 69, no acute ischemic changes. The CT of the abdomen showed incarcerated umbilical hernia with a high-grade bowel obstruction.  Patient and the family consented for emergency surgical repair.  Postop, patient was extubated but failed extubation and had to be reintubated.  He is currently in intensive care unit, sedated, on ventilator support. Internal medicine team is consulted to help with medical management in this patient.  PAST MEDICAL HISTORY:   Past Medical History:  Diagnosis Date  . Acute pulmonary edema ([place]) 02/26/2013  . Anxiety   . Arthritis    little  . Benign prostatic hyperplasia with urinary obstruction 02/24/2012  . BP (high blood pressure) 10/31/2015  . Cataract extraction status of left eye   . Cataract extraction status of right eye   . Cerebrovascular accident (CVA) ([place]) 04/24/2012   affected right eye, limited  peripheral vision  . Chronic kidney disease 07/25/2017   stage 3  . Congestive heart failure ([place]) 10/31/2015  . Dyspnea    uses continuous o2  . Dysrhythmia    AF  . HLD (hyperlipidemia) 10/31/2015  . Hydrocele 02/25/2012  . Myocardial infarction ([place])    per patient, small MI  . Renal insufficiency   . Unstable angina pectoris ([place]) 02/26/2013  . Urge incontinence of urine 05/30/2014  . Urinary urgency 10/31/2015    PAST SURGICAL HISTORY:  Past Surgical History:  Procedure Laterality Date  . ANKLE FRACTURE SURGERY Right 2006   metal in ankle, screws and plate  . CAPD INSERTION N/A 08/01/2017   Procedure: LAPAROSCOPIC INSERTION CONTINUOUS AMBULATORY PERITONEAL DIALYSIS  (CAPD) CATHETER;  Surgeon: Katha Cabal, MD;  Location: [facility] ORS;  Service: Vascular;  Laterality: N/A;  . CIRCUMCISION N/A 11/13/2015   Procedure: CIRCUMCISION ADULT;  Surgeon: Hollice Espy, MD;  Location: [facility];  Service: Urology;  Laterality: N/A;  . DIALYSIS/PERMA CATHETER INSERTION N/A 03/17/2017   Procedure: DIALYSIS/PERMA CATHETER INSERTION;  Surgeon: Algernon Huxley, MD;  Location: [facility];  Service: Cardiovascular;  Laterality: N/A;  . DIALYSIS/PERMA CATHETER REMOVAL N/A 09/18/2017   Procedure: DIALYSIS/PERMA CATHETER REMOVAL;  Surgeon: Algernon Huxley, MD;  Location: [facility];  Service: Cardiovascular;  Laterality: N/A;  . EYE SURGERY Bilateral    cataract extractions  . HERNIA REPAIR    . HYDROCELE EXCISION Bilateral 11/13/2015   Procedure: HYDROCELECTOMY ADULT;  Surgeon: Hollice Espy, MD;  Location: [facility];  Service: Urology;  Laterality: Bilateral;  . TONSILLECTOMY  1950's  . TONSILLECTOMY    .  [place]   with mesh  . VARICOCELECTOMY  1948  . VASECTOMY      SOCIAL HISTORY:  Social History   Tobacco Use  . Smoking status: Former Smoker    Packs/day: 1.00    Years: 20.00    Pack years: 20.00    Types: Cigarettes    Last attempt to quit: 11/03/1954     Years since quitting: 63.1  . Smokeless tobacco: Never Used  . Tobacco comment: started smoking at 84 years old  Substance Use Topics  . Alcohol use: No    Alcohol/week: 4.8 oz    Types: 7 Shots of liquor, 1 Standard drinks or equivalent per week    Frequency: Never    Comment: 1 shot of gin a day.no gin x 1 year since dialysis    FAMILY HISTORY:  Family History  Problem Relation Age of Onset  . Kidney cancer Neg Hx   . Prostate cancer Neg Hx     DRUG ALLERGIES:  Allergies  Allergen Reactions  . Eliquis [Apixaban] Other (See Comments)    5 hours of nonstop nasal bleeding.  Required OVN stay in [place].  Occurred 04/2017  . Acyclovir And Related     REVIEW OF SYSTEMS:   Unable to obtain, due to patient being sedated, on ventilator support.  MEDICATIONS AT HOME:  Prior to Admission medications   Medication Sig Start Date End Date Taking? Authorizing Provider  acetaminophen (TYLENOL) 500 MG tablet Take 500 mg by mouth daily as needed for moderate pain or headache.   Yes [provider]  ALPRAZolam (XANAX) 0.25 MG tablet Take 0.25 mg by mouth at bedtime as needed for sleep. 08/19/16  Yes [provider]  amiodarone (PACERONE) 200 MG tablet Take 1 tablet (200 mg total) by mouth daily. 03/21/17 12/02/18 Yes Henreitta Leber, MD  aspirin EC 81 MG EC tablet Take 1 tablet (81 mg total) by mouth daily. 08/25/16  Yes [name], MD  atorvastatin (LIPITOR) 10 MG tablet Take 10 mg by mouth daily.    Yes [provider]  furosemide (LASIX) 40 MG tablet Take 1 tablet (40 mg total) by mouth daily. Patient taking differently: Take 40 mg by mouth 2 (two) times daily.  03/20/17  Yes Henreitta Leber, MD  gentamicin cream (GARAMYCIN) 0.1 % Apply 1 application topically daily. Apply to stomach catheter when changing. 09/03/17  Yes [provider]  KLOR-CON M10 10 MEQ tablet Take 10 mEq by mouth daily. 03/01/17  Yes [provider]  midodrine  (PROAMATINE) 5 MG tablet Take 5 mg by mouth 3 (three) times daily.   Yes [provider]  multivitamin (RENA-VIT) TABS tablet Take 1 tablet by mouth daily. 06/17/17  Yes [provider]  sodium chloride (OCEAN) 0.65 % SOLN nasal spray Place 1 spray into both nostrils as needed for congestion.   Yes [provider]  tamsulosin (FLOMAX) 0.4 MG CAPS capsule Take 0.4 mg by mouth every evening.    Yes [provider]  HYDROcodone-acetaminophen (NORCO) 5-325 MG tablet Take 1-2 tablets by mouth every 6 (six) hours as needed. Patient not taking: Reported on 08/18/2017 08/01/17   Schnier, Dolores Lory, MD      PHYSICAL EXAMINATION:   VITAL SIGNS: Blood pressure (!) 103/56, pulse 73, temperature 98 F (36.7 C), temperature source Oral, resp. rate 17, height 5\' 11"  (1.803 m), weight 59.8 kg (131 lb 13.4 oz), SpO2 100 %.  GENERAL:  84 y.o.-year-old  patient lying in the bed, sedated, on ventilator support.Marland Kitchen  EYES: Pupils equal, round, reactive to light and accommodation. No scleral icterus.  HEENT: Head atraumatic, normocephalic. Oropharynx and nasopharynx clear.  NECK:  Supple, no jugular venous distention. No thyroid enlargement, no tenderness.  LUNGS: Patient is on ventilator support.  Reduced breath sounds bilaterally, no wheezing. No use of accessory muscles of respiration.  CARDIOVASCULAR: S1, S2 normal. No murmurs, rubs, or gallops.  ABDOMEN: Soft, slightly distended.  Mid abdominal surgical wound is noted status post ventral hernia repair.  PD catheter in place. EXTREMITIES: No pedal edema, cyanosis, or clubbing.  NEUROLOGIC EXAM: Unable to perform due to patient being sedated. PSYCHIATRIC: The patient is sedated.  SKIN: No obvious rash, lesion, or ulcer.   LABORATORY PANEL:   CBC Recent Labs  Lab 12/01/17 1706  WBC 12.6*  HGB 12.7*  HCT 37.1*  PLT 185  MCV 96.9  MCH 33.2  MCHC 34.3  RDW 15.5*    ------------------------------------------------------------------------------------------------------------------  Chemistries  Recent Labs  Lab 12/01/17 1706  NA 143  K 4.5  CL 101  CO2 34*  GLUCOSE 143*  BUN 35*  CREATININE 3.18*  CALCIUM 9.1  AST 31  ALT 21  ALKPHOS 72  BILITOT 1.1   ------------------------------------------------------------------------------------------------------------------ estimated creatinine clearance is 13.3 mL/min (A) (by C-G formula based on SCr of 3.18 mg/dL (H)). ------------------------------------------------------------------------------------------------------------------ No results for input(s): TSH, T4TOTAL, T3FREE, THYROIDAB in the last 72 hours.  Invalid input(s): FREET3   Coagulation profile Recent Labs  Lab 12/01/17 1901  INR 1.06   ------------------------------------------------------------------------------------------------------------------- No results for input(s): DDIMER in the last 72 hours. -------------------------------------------------------------------------------------------------------------------  Cardiac Enzymes Recent Labs  Lab 12/01/17 1706 12/01/17 1901  TROPONINI 0.05* 0.06*   ------------------------------------------------------------------------------------------------------------------ Invalid input(s): POCBNP  ---------------------------------------------------------------------------------------------------------------  Urinalysis    Component Value Date/Time   COLORURINE AMBER (A) 03/09/2017 1654   APPEARANCEUR HAZY (A) 03/09/2017 1654   APPEARANCEUR Clear 11/22/2015 1514   LABSPEC 1.014 03/09/2017 1654   LABSPEC 1.008 05/28/2012 1120   PHURINE 5.0 03/09/2017 1654   GLUCOSEU NEGATIVE 03/09/2017 1654   GLUCOSEU Negative 05/28/2012 1120   HGBUR SMALL (A) 03/09/2017 1654   BILIRUBINUR NEGATIVE 03/09/2017 1654   BILIRUBINUR Negative 11/22/2015 1514   BILIRUBINUR Negative 05/28/2012  1120   KETONESUR NEGATIVE 03/09/2017 1654   PROTEINUR 30 (A) 03/09/2017 1654   NITRITE NEGATIVE 03/09/2017 1654   LEUKOCYTESUR NEGATIVE 03/09/2017 1654   LEUKOCYTESUR Negative 11/22/2015 1514   LEUKOCYTESUR Negative 05/28/2012 1120     RADIOLOGY: Dg Chest 2 View  Result Date: 12/01/2017 CLINICAL DATA:  Nausea and vomiting since yesterday, swelling around calf site, distension to lower abdomen; history hypertension, CHF, stroke, post MI, former smoker, stage III chronic kidney disease EXAM: CHEST - 2 VIEW COMPARISON:  08/01/2017 FINDINGS: Enlargement of cardiac silhouette with pulmonary vascular congestion. Atherosclerotic calcification aorta. Minimal peribronchial thickening with small BILATERAL pleural effusions and mild pulmonary edema, improved since previous exam. Minimal bibasilar atelectasis. No pneumothorax. Bones demineralized. IMPRESSION: Improved CHF with decreased bibasilar pleural effusions and atelectasis. Electronically Signed   By: Lavonia Dana M.D.   On: 12/01/2017 17:52   Ct Abdomen Pelvis W Contrast  Result Date: 12/01/2017 CLINICAL DATA:  84 year old male with lower abdominal distension and swelling at peritoneal dialysis catheter site. Nausea vomiting since yesterday. EXAM: CT ABDOMEN AND PELVIS WITH CONTRAST TECHNIQUE: Multidetector CT imaging of the abdomen and pelvis was performed using the standard protocol following bolus administration of intravenous contrast. CONTRAST:  13mL OMNIPAQUE IOHEXOL 300 MG/ML  SOLN  COMPARISON:  Noncontrast CT Abdomen and Pelvis 04/19/2017 and earlier. FINDINGS: Lower chest: Small bilateral pleural effusions are layering and subpulmonic and regressed compared to November 2018. Cardiomegaly appears stable. No pericardial effusion. Lung base atelectasis without consolidation. Hepatobiliary: Dependent sludge or vicarious contrast excretion to the gallbladder. Negative liver. No biliary ductal enlargement. Pancreas: Negative. Spleen: Negative.  Adrenals/Urinary Tract: Normal adrenal glands. There is a degree of native renal atrophy, but symmetric bilateral renal enhancement and contrast excretion. No hydronephrosis or hydroureter. Diminutive and unremarkable urinary bladder. Stomach/Bowel: Partially decompressed rectosigmoid colon, which might account for the appearance of mild sigmoid wall thickening on series 2, image 58. Mild redundancy at the junction of the sigmoid and descending colon. The left colon has a somewhat medial course. Retained stool throughout the transverse and right colon. The hepatic flexure is mildly redundant. The terminal ileum is decompressed along with most small bowel loops in the pelvis. However, there are dilated proximal and mid small bowel loops which are fluid-filled and measure up to 34 millimeters diameter. An abrupt transition occurs at a small umbilical hernia which contains a knuckle of small bowel best seen on sagittal image 51, along with a small volume of free fluid. From here the downstream loops are decompressed and the upstream loops are dilated. This hernia occurs just superior and medial to the left PD catheter percutaneous site, which appears unrelated. The proximal stomach is also dilated with fluid although the distal stomach and duodenum are relatively decompressed. No abdominal free air. Trace free fluid elsewhere. Vascular/Lymphatic: Aortoiliac calcified atherosclerosis, with atherosclerosis of the major aortic branches. Major arterial structures are patent. Portal venous system is patent. No lymphadenopathy. Reproductive: Prior right inguinal hernia repair with no adverse features. Other: Small volume pelvic free to fluid the peritoneal in proximity dialysis catheter. Musculoskeletal: No acute osseous abnormality identified. IMPRESSION: 1. Small incarcerated umbilical hernia resulting in High-grade Small Bowel Obstruction. This hernia and obstruction appear unrelated to the PD catheter. See sagittal image  51, coronal image 24. 2. No free air. Small volume of free fluid in the abdomen and pelvis which could be reactive due to #1 or peritoneal dialysis related. 3. Small bilateral pleural effusions are regressed compared to February 2018. 4.  Aortic Atherosclerosis (ICD10-I70.0). Electronically Signed   By: Genevie Ann M.D.   On: 12/01/2017 18:16   Portable Chest X-ray  Result Date: 12/02/2017 CLINICAL DATA:  Respiratory distress EXAM: PORTABLE CHEST 1 VIEW COMPARISON:  Chest x-ray earlier today FINDINGS: New large volume pneumoperitoneum. The patient is postop for hernia repair. Increased left lower lobe opacity with probable volume loss. There are small pleural effusions. No pneumothorax. Cardiomegaly. Endotracheal tube tip at the clavicular heads. The orogastric tube is in stable position. IMPRESSION: 1. Endotracheal and orogastric tubes in unremarkable position. 2. Worsening left lower lobe aeration. 3. Small pleural effusions. 4. Pneumoperitoneum in this postoperative patient. Electronically Signed   By: Monte Fantasia M.D.   On: 12/02/2017 00:03   Dg Abd Portable 1 View  Result Date: 12/01/2017 CLINICAL DATA:  Nasogastric tube placement EXAM: PORTABLE ABDOMEN - 1 VIEW COMPARISON:  CT from earlier today FINDINGS: Nasogastric tube tip over the upper stomach with side port at the GE junction. Gas distended small bowel loops with fluid levels as seen on prior CT. Cardiomegaly and small pleural effusions. IMPRESSION: Nasogastric tube tip in good position over the proximal stomach. Electronically Signed   By: Monte Fantasia M.D.   On: 12/01/2017 19:51    EKG: Orders placed or  performed during the hospital encounter of 12/01/17  . ED EKG  . ED EKG  . EKG 12-Lead  . EKG 12-Lead    IMPRESSION AND PLAN:  1.  Acute incarcerated recurrent ventral hernia, status post surgical repair.  Continue management per surgical team. 2.  Acute respiratory failure status post surgery.  Continue ventilator support,  management per ICU team. 3.  Chronic systolic CHF, currently clinically compensated.  Continue medical treatment. 4.  Paroxysmal atrial fibrillation, currently rate controlled, in sinus rhythm.  Continue to monitor on telemetry.  Continue beta-blocker and amiodarone, IV route. 5.  End-stage renal disease on peritoneal dialysis.  Creatinine level is stable, baseline.  Continue to monitor kidney function closely and avoid nephrotoxic medications.  Continue dialysis per nephrology. 6.  Elevated troponin level.  First 2 troponin levels are elevated at 0.05 and 0.06.  This is likely related to demand ischemia, from acute illness.  We will continue to monitor patient closely on telemetry.  All the records are reviewed and case discussed with ED provider. Management plans discussed with the patient, family and they are in agreement.  CODE STATUS: Full    Code Status Orders  (From admission, onward)        Start     Ordered   12/01/17 2325  Full code  Continuous     12/01/17 2325    Code Status History    Date Active Date Inactive Code Status Order ID Comments User Context   08/01/2017 1532 08/02/2017 1528 Full Code [id number]  Leonides Schanz Inpatient   03/09/2017 2147 03/20/2017 1942 Full Code [id number]  Quintella Baton, MD Inpatient   08/23/2016 1948 08/25/2016 1452 Full Code [id number]  Theodoro Grist, MD Inpatient    Advance Directive Documentation     Most Recent Value  Type of Advance Directive  Living will  Pre-existing out of facility DNR order (yellow form or pink MOST form)  -  "MOST" Form in Place?  -       TOTAL TIME TAKING CARE OF THIS PATIENT: 45 minutes.    Amelia Jo M.D on 12/02/2017 at 2:39 AM  Between 7am to 6pm - Pager - [phone]  After 6pm go to [email or web address] - password [email or web address]  [facility] Office  [phone]  CC: Primary care physician; Kirk Ruths, MD

## 2017-12-02 NOTE — Consult Note (Signed)
PULMONARY / CRITICAL CARE MEDICINE   Name: Nasim Habeeb Sr. MRN: [id number] DOB: 06-11-1925    ADMISSION DATE:  12/01/2017   CONSULTATION DATE:  12/02/17  REFERRING MD:  Dr Dahlia Byes  REASON: Acute respiratory failure s/p incarcerated ventral hernia repair  HISTORY OF PRESENT ILLNESS:   This is an 84 year old Caucasian male with a medical history as indicated below, and end-stage renal disease on peritoneal dialysis who presented to the ED with fever abdominal pain and distention x4 days.  His CT abdomen showed an incarcerated hernia with a high grade bowel obstruction.  He was emergently taken for surgical intervention.  Postoperatively, patient was extubated but failed extubation and had to be reintubated.  He is being admitted to the ICU for further management.  Chest x-ray  post intubation shows worsening left lower lobe aeration, small pleural effusions and pneumoperitoneum.  PAST MEDICAL HISTORY :  He  has a past medical history of Acute pulmonary edema ([place]) (02/26/2013), Anxiety, Arthritis, Benign prostatic hyperplasia with urinary obstruction (02/24/2012), BP (high blood pressure) (10/31/2015), Cataract extraction status of left eye, Cataract extraction status of right eye, Cerebrovascular accident (CVA) ([place]) (04/24/2012), Chronic kidney disease (07/25/2017), Congestive heart failure ([place]) (10/31/2015), Dyspnea, Dysrhythmia, HLD (hyperlipidemia) (10/31/2015), Hydrocele (02/25/2012), Myocardial infarction [facility]), Renal insufficiency, Unstable angina pectoris ([place]) (02/26/2013), Urge incontinence of urine (05/30/2014), and Urinary urgency (10/31/2015).  PAST SURGICAL HISTORY: He  has a past surgical history that includes Vasectomy; Varicocelectomy (1948); Tonsillectomy (1950's); Umbilical hernia repair (1995); Ankle fracture surgery (Right, 2006); Tonsillectomy; Hernia repair; Circumcision (N/A, 11/13/2015); Hydrocele surgery (Bilateral, 11/13/2015); DIALYSIS/PERMA CATHETER INSERTION (N/A, 03/17/2017); Eye  surgery (Bilateral); CAPD insertion (N/A, 08/01/2017); and DIALYSIS/PERMA CATHETER REMOVAL (N/A, 09/18/2017).  Allergies  Allergen Reactions  . Eliquis [Apixaban] Other (See Comments)    5 hours of nonstop nasal bleeding.  Required OVN stay in [place].  Occurred 04/2017  . Acyclovir And Related     No current facility-administered medications on file prior to encounter.    Current Outpatient Medications on File Prior to Encounter  Medication Sig  . acetaminophen (TYLENOL) 500 MG tablet Take 500 mg by mouth daily as needed for moderate pain or headache.  . ALPRAZolam (XANAX) 0.25 MG tablet Take 0.25 mg by mouth at bedtime as needed for sleep.  Marland Kitchen amiodarone (PACERONE) 200 MG tablet Take 1 tablet (200 mg total) by mouth daily.  Marland Kitchen aspirin EC 81 MG EC tablet Take 1 tablet (81 mg total) by mouth daily.  Marland Kitchen atorvastatin (LIPITOR) 10 MG tablet Take 10 mg by mouth daily.   . furosemide (LASIX) 40 MG tablet Take 1 tablet (40 mg total) by mouth daily. (Patient taking differently: Take 40 mg by mouth 2 (two) times daily. )  . gentamicin cream (GARAMYCIN) 0.1 % Apply 1 application topically daily. Apply to stomach catheter when changing.  Marland Kitchen KLOR-CON M10 10 MEQ tablet Take 10 mEq by mouth daily.  . midodrine (PROAMATINE) 5 MG tablet Take 5 mg by mouth 3 (three) times daily.  . multivitamin (RENA-VIT) TABS tablet Take 1 tablet by mouth daily.  . sodium chloride (OCEAN) 0.65 % SOLN nasal spray Place 1 spray into both nostrils as needed for congestion.  . tamsulosin (FLOMAX) 0.4 MG CAPS capsule Take 0.4 mg by mouth every evening.   Marland Kitchen HYDROcodone-acetaminophen (NORCO) 5-325 MG tablet Take 1-2 tablets by mouth every 6 (six) hours as needed. (Patient not taking: Reported on 08/18/2017)    FAMILY HISTORY:  His has no family status information on file.  SOCIAL HISTORY: He  reports that he quit smoking about 63 years ago. His smoking use included cigarettes. He has a 20.00 pack-year smoking history. He  has never used smokeless tobacco. He reports that he does not drink alcohol or use drugs.  REVIEW OF SYSTEMS:   Unable to obtain as patient is currently intubated and sedated  SUBJECTIVE:   VITAL SIGNS: BP (!) 110/57   Pulse 64   Temp 98 F (36.7 C) (Oral)   Resp 17   Ht 5\' 11"  (1.803 m)   Wt 131 lb 13.4 oz (59.8 kg)   SpO2 99%   BMI 18.39 kg/m   HEMODYNAMICS:    VENTILATOR SETTINGS: Vent Mode: PRVC FiO2 (%):  [40 %-100 %] 40 % Set Rate:  [14 bmp] 14 bmp Vt Set:  [450 mL] 450 mL PEEP:  [5 cmH20] 5 cmH20 Plateau Pressure:  [17 cmH20] 17 cmH20  INTAKE / OUTPUT: No intake/output data recorded.  PHYSICAL EXAMINATION: General: No acute distress Neuro: Awake, follows basic commands, moves all extremities HEENT: PERRLA, trachea midline, no JVD Cardiovascular: Apical pulse regular, S1-S2, no murmur regurg or gallop Lungs: Bilateral breath sounds, diminished in the bases, bibasilar Rales left greater than right, no expiratory wheezes, no rhonchi Abdomen: Nondistended, hypoactive bowel sounds in all 4 quadrants, surgical incision intact peritoneal dialysis catheter in place Musculoskeletal: Positive range of motion in upper and lower extremities no joint deformities Skin: Warm and dry LABS:  BMET Recent Labs  Lab 12/01/17 1706  NA 143  K 4.5  CL 101  CO2 34*  BUN 35*  CREATININE 3.18*  GLUCOSE 143*    Electrolytes Recent Labs  Lab 12/01/17 1706  CALCIUM 9.1    CBC Recent Labs  Lab 12/01/17 1706  WBC 12.6*  HGB 12.7*  HCT 37.1*  PLT 185    Coag's Recent Labs  Lab 12/01/17 1901  APTT 48*  INR 1.06    Sepsis Markers No results for input(s): LATICACIDVEN, PROCALCITON, O2SATVEN in the last 168 hours.  ABG No results for input(s): PHART, PCO2ART, PO2ART in the last 168 hours.  Liver Enzymes Recent Labs  Lab 12/01/17 1706  AST 31  ALT 21  ALKPHOS 72  BILITOT 1.1  ALBUMIN 3.2*    Cardiac Enzymes Recent Labs  Lab 12/01/17 1706  12/01/17 1901  TROPONINI 0.05* 0.06*    Glucose Recent Labs  Lab 12/01/17 2325  GLUCAP 147*    Imaging Dg Chest 2 View  Result Date: 12/01/2017 CLINICAL DATA:  Nausea and vomiting since yesterday, swelling around calf site, distension to lower abdomen; history hypertension, CHF, stroke, post MI, former smoker, stage III chronic kidney disease EXAM: CHEST - 2 VIEW COMPARISON:  08/01/2017 FINDINGS: Enlargement of cardiac silhouette with pulmonary vascular congestion. Atherosclerotic calcification aorta. Minimal peribronchial thickening with small BILATERAL pleural effusions and mild pulmonary edema, improved since previous exam. Minimal bibasilar atelectasis. No pneumothorax. Bones demineralized. IMPRESSION: Improved CHF with decreased bibasilar pleural effusions and atelectasis. Electronically Signed   By: Lavonia Dana M.D.   On: 12/01/2017 17:52   Ct Abdomen Pelvis W Contrast  Result Date: 12/01/2017 CLINICAL DATA:  84 year old male with lower abdominal distension and swelling at peritoneal dialysis catheter site. Nausea vomiting since yesterday. EXAM: CT ABDOMEN AND PELVIS WITH CONTRAST TECHNIQUE: Multidetector CT imaging of the abdomen and pelvis was performed using the standard protocol following bolus administration of intravenous contrast. CONTRAST:  79mL OMNIPAQUE IOHEXOL 300 MG/ML  SOLN COMPARISON:  Noncontrast CT Abdomen and Pelvis 04/19/2017 and earlier.  FINDINGS: Lower chest: Small bilateral pleural effusions are layering and subpulmonic and regressed compared to November 2018. Cardiomegaly appears stable. No pericardial effusion. Lung base atelectasis without consolidation. Hepatobiliary: Dependent sludge or vicarious contrast excretion to the gallbladder. Negative liver. No biliary ductal enlargement. Pancreas: Negative. Spleen: Negative. Adrenals/Urinary Tract: Normal adrenal glands. There is a degree of native renal atrophy, but symmetric bilateral renal enhancement and contrast  excretion. No hydronephrosis or hydroureter. Diminutive and unremarkable urinary bladder. Stomach/Bowel: Partially decompressed rectosigmoid colon, which might account for the appearance of mild sigmoid wall thickening on series 2, image 58. Mild redundancy at the junction of the sigmoid and descending colon. The left colon has a somewhat medial course. Retained stool throughout the transverse and right colon. The hepatic flexure is mildly redundant. The terminal ileum is decompressed along with most small bowel loops in the pelvis. However, there are dilated proximal and mid small bowel loops which are fluid-filled and measure up to 34 millimeters diameter. An abrupt transition occurs at a small umbilical hernia which contains a knuckle of small bowel best seen on sagittal image 51, along with a small volume of free fluid. From here the downstream loops are decompressed and the upstream loops are dilated. This hernia occurs just superior and medial to the left PD catheter percutaneous site, which appears unrelated. The proximal stomach is also dilated with fluid although the distal stomach and duodenum are relatively decompressed. No abdominal free air. Trace free fluid elsewhere. Vascular/Lymphatic: Aortoiliac calcified atherosclerosis, with atherosclerosis of the major aortic branches. Major arterial structures are patent. Portal venous system is patent. No lymphadenopathy. Reproductive: Prior right inguinal hernia repair with no adverse features. Other: Small volume pelvic free to fluid the peritoneal in proximity dialysis catheter. Musculoskeletal: No acute osseous abnormality identified. IMPRESSION: 1. Small incarcerated umbilical hernia resulting in High-grade Small Bowel Obstruction. This hernia and obstruction appear unrelated to the PD catheter. See sagittal image 51, coronal image 24. 2. No free air. Small volume of free fluid in the abdomen and pelvis which could be reactive due to #1 or peritoneal  dialysis related. 3. Small bilateral pleural effusions are regressed compared to February 2018. 4.  Aortic Atherosclerosis (ICD10-I70.0). Electronically Signed   By: Genevie Ann M.D.   On: 12/01/2017 18:16   Portable Chest X-ray  Result Date: 12/02/2017 CLINICAL DATA:  Respiratory distress EXAM: PORTABLE CHEST 1 VIEW COMPARISON:  Chest x-ray earlier today FINDINGS: New large volume pneumoperitoneum. The patient is postop for hernia repair. Increased left lower lobe opacity with probable volume loss. There are small pleural effusions. No pneumothorax. Cardiomegaly. Endotracheal tube tip at the clavicular heads. The orogastric tube is in stable position. IMPRESSION: 1. Endotracheal and orogastric tubes in unremarkable position. 2. Worsening left lower lobe aeration. 3. Small pleural effusions. 4. Pneumoperitoneum in this postoperative patient. Electronically Signed   By: Monte Fantasia M.D.   On: 12/02/2017 00:03   Dg Abd Portable 1 View  Result Date: 12/01/2017 CLINICAL DATA:  Nasogastric tube placement EXAM: PORTABLE ABDOMEN - 1 VIEW COMPARISON:  CT from earlier today FINDINGS: Nasogastric tube tip over the upper stomach with side port at the GE junction. Gas distended small bowel loops with fluid levels as seen on prior CT. Cardiomegaly and small pleural effusions. IMPRESSION: Nasogastric tube tip in good position over the proximal stomach. Electronically Signed   By: Monte Fantasia M.D.   On: 12/01/2017 19:51   ANTIBIOTICS: Ancef for surgical prophylaxis  SIGNIFICANT EVENTS: 7/8> admitted and taken to the OR  for incarcerated ventral hernia  LINES/TUBES: Peripheral IVs  DISCUSSION: 84 year old male presenting with acute respiratory failure status post ventral hernia repair  ASSESSMENT / Acute respiratory failure-likely multifactorial due to slow recovery from anesthesia, end-stage renal failure and acute CHF exacerbation; I doubt any pulmonary infectious process Incarcerated ventral hernia  status post repair End-stage renal disease on peritoneal dialysis Severe systolic heart failure with an EF of 20%-last echo 07/19/2017   PLAN Vent support with current settings Weaning trials as tolerated ABG and chest x-ray post intubation reviewed ABG and chest x-ray as needed Hemodynamic monitoring per ICU protocol Norepinephrine infusion and titrate to maintain mean arterial blood pressure 65 and above Fentanyl and as needed Versed for vent sedation and comfort Nephrology consult for inpatient management of peritoneal dialysis Surgery following GI and DVT prophylaxis  FAMILY  - Updates: Patient and family updated at bedside  Magdalene S. [facility] ANP-BC Pulmonary and Critical Care Medicine [facility] Pager [phone] or [phone]  NB: This document was prepared using Dragon voice recognition software and may include unintentional dictation errors.    12/02/2017, 12:26 AM

## 2017-12-02 NOTE — Progress Notes (Signed)
POD # 1 resp failure and reintubated in PACu HE is doing well AVSS  PE NAD, follows commands, sedated Abd: soft, incision c/d/i, no infection  A/P Doing well D/W nephrology and we will hold PD for 1 week to avoid leak Appreciate pulm assistance Keep NGT for now for resolving SBO

## 2017-12-03 ENCOUNTER — Encounter
Admission: EM | Disposition: A | Discharge status: Home Health | Payer: Self-pay | Source: Home / Self Care | Attending: Surgery

## 2017-12-03 ENCOUNTER — Encounter: Payer: Self-pay | Admitting: Vascular Surgery

## 2017-12-03 DIAGNOSIS — K420 Umbilical hernia with obstruction, without gangrene: Secondary | ICD-10-CM

## 2017-12-03 HISTORY — PX: DIALYSIS/PERMA CATHETER INSERTION: CATH118288

## 2017-12-03 LAB — BASIC METABOLIC PANEL
ANION GAP: 7 (ref 5–15)
BUN: 49 mg/dL — ABNORMAL HIGH (ref 8–23)
CHLORIDE: 107 mmol/L (ref 98–111)
CO2: 31 mmol/L (ref 22–32)
Calcium: 8.7 mg/dL — ABNORMAL LOW (ref 8.9–10.3)
Creatinine, Ser: 3.27 mg/dL — ABNORMAL HIGH (ref 0.61–1.24)
GFR calc Af Amer: 18 mL/min — ABNORMAL LOW (ref >60)
GFR calc non Af Amer: 15 mL/min — ABNORMAL LOW (ref >60)
Glucose, Bld: 99 mg/dL (ref 70–99)
POTASSIUM: 4.6 mmol/L (ref 3.5–5.1)
SODIUM: 145 mmol/L (ref 135–145)

## 2017-12-03 LAB — GLUCOSE, CAPILLARY: GLUCOSE-CAPILLARY: 82 mg/dL (ref 70–99)

## 2017-12-03 LAB — SURGICAL PATHOLOGY

## 2017-12-03 LAB — PHOSPHORUS: PHOSPHORUS: 4.6 mg/dL (ref 2.5–4.6)

## 2017-12-03 SURGERY — DIALYSIS/PERMA CATHETER INSERTION
Anesthesia: Moderate Sedation

## 2017-12-03 MED ORDER — CHLORHEXIDINE GLUCONATE CLOTH 2 % EX PADS
6.0000 | MEDICATED_PAD | Freq: Every day | CUTANEOUS | Status: DC
  Administered 2017-12-03: 6 via TOPICAL

## 2017-12-03 MED ORDER — LORAZEPAM 2 MG/ML IJ SOLN
1.0000 mg | Freq: Once | INTRAMUSCULAR | Status: AC
  Administered 2017-12-03: 1 mg via INTRAVENOUS
  Filled 2017-12-03: qty 1

## 2017-12-03 MED ORDER — LIDOCAINE-EPINEPHRINE (PF) 1 %-1:200000 IJ SOLN
INTRAMUSCULAR | Status: AC
  Filled 2017-12-03: qty 30

## 2017-12-03 MED ORDER — MIDAZOLAM HCL 2 MG/2ML IJ SOLN
INTRAMUSCULAR | Status: DC | PRN
  Administered 2017-12-03 (×2): 1 mg via INTRAVENOUS

## 2017-12-03 MED ORDER — SODIUM CHLORIDE 0.9 % IV SOLN: 100.0000 mL | INTRAVENOUS | Status: DC | PRN

## 2017-12-03 MED ORDER — LIDOCAINE-PRILOCAINE 2.5-2.5 % EX CREA
1.0000 "application " | TOPICAL_CREAM | CUTANEOUS | Status: DC | PRN
  Filled 2017-12-03: qty 5

## 2017-12-03 MED ORDER — HEPARIN (PORCINE) IN NACL 1000-0.9 UT/500ML-% IV SOLN
INTRAVENOUS | Status: AC
  Filled 2017-12-03: qty 500

## 2017-12-03 MED ORDER — HALOPERIDOL LACTATE 5 MG/ML IJ SOLN
2.0000 mg | Freq: Once | INTRAMUSCULAR | Status: AC
  Administered 2017-12-03: 2 mg via INTRAMUSCULAR
  Filled 2017-12-03: qty 0.4

## 2017-12-03 MED ORDER — FENTANYL CITRATE (PF) 100 MCG/2ML IJ SOLN
INTRAMUSCULAR | Status: AC
  Filled 2017-12-03: qty 2

## 2017-12-03 MED ORDER — MIDAZOLAM HCL 5 MG/5ML IJ SOLN
INTRAMUSCULAR | Status: AC
  Filled 2017-12-03: qty 5

## 2017-12-03 MED ORDER — HEPARIN SODIUM (PORCINE) 10000 UNIT/ML IJ SOLN
INTRAMUSCULAR | Status: AC
  Filled 2017-12-03: qty 1

## 2017-12-03 MED ORDER — FENTANYL CITRATE (PF) 100 MCG/2ML IJ SOLN
INTRAMUSCULAR | Status: DC | PRN
  Administered 2017-12-03 (×2): 25 ug via INTRAVENOUS

## 2017-12-03 MED ORDER — ONDANSETRON HCL 4 MG/2ML IJ SOLN
INTRAMUSCULAR | Status: AC
  Filled 2017-12-03: qty 2

## 2017-12-03 MED ORDER — LIDOCAINE HCL (PF) 1 % IJ SOLN
5.0000 mL | INTRAMUSCULAR | Status: DC | PRN
  Filled 2017-12-03: qty 5

## 2017-12-03 MED ORDER — PENTAFLUOROPROP-TETRAFLUOROETH EX AERO
1.0000 "application " | INHALATION_SPRAY | CUTANEOUS | Status: DC | PRN
  Filled 2017-12-03: qty 30

## 2017-12-03 MED ORDER — HEPARIN SODIUM (PORCINE) 1000 UNIT/ML DIALYSIS
1000.0000 [IU] | INTRAMUSCULAR | Status: DC | PRN
  Filled 2017-12-03: qty 1

## 2017-12-03 MED ORDER — CEFAZOLIN SODIUM-DEXTROSE 1-4 GM/50ML-% IV SOLN
INTRAVENOUS | Status: AC
  Filled 2017-12-03: qty 50

## 2017-12-03 MED ORDER — ALTEPLASE 2 MG IJ SOLR: 2.0000 mg | Freq: Once | INTRAMUSCULAR | Status: DC | PRN

## 2017-12-03 SURGICAL SUPPLY — 11 items
CATH PALINDROME RT-P 15FX19CM (CATHETERS) ×3 IMPLANT
COVER PROBE U/S 5X48 (MISCELLANEOUS) ×3 IMPLANT
DERMABOND ADVANCED (GAUZE/BANDAGES/DRESSINGS) ×2
DERMABOND ADVANCED .7 DNX12 (GAUZE/BANDAGES/DRESSINGS) ×1 IMPLANT
DRAPE INCISE IOBAN 66X45 STRL (DRAPES) ×3 IMPLANT
NEEDLE ENTRY 21GA 7CM ECHOTIP (NEEDLE) ×3 IMPLANT
PACK ANGIOGRAPHY (CUSTOM PROCEDURE TRAY) ×3 IMPLANT
SET INTRO CAPELLA COAXIAL (SET/KITS/TRAYS/PACK) ×3 IMPLANT
SUT MON AB 4-0 PC3 18 (SUTURE) ×3 IMPLANT
SUT SILK 0 FSL (SUTURE) ×3 IMPLANT
TOWEL OR 17X26 4PK STRL BLUE (TOWEL DISPOSABLE) ×3 IMPLANT

## 2017-12-03 NOTE — Progress Notes (Signed)
HD tx start    12/03/17 1530  Vital Signs  Pulse Rate 80  Pulse Rate Source Monitor  Resp (!) 21  BP 133/74  BP Location Right Arm  BP Method Automatic  Patient Position (if appropriate) Lying  Oxygen Therapy  SpO2 100 %  O2 Device Nasal Cannula  O2 Flow Rate (L/min) 3 L/min  During Hemodialysis Assessment  Blood Flow Rate (mL/min) 300 mL/min  Arterial Pressure (mmHg) -120 mmHg  Venous Pressure (mmHg) 110 mmHg  Transmembrane Pressure (mmHg) 50 mmHg  Ultrafiltration Rate (mL/min) 330 mL/min  Dialysate Flow Rate (mL/min) 600 ml/min  Conductivity: Machine  13.9  HD Safety Checks Performed Yes  Dialysis Fluid Bolus Normal Saline  Bolus Amount (mL) 250 mL  Intra-Hemodialysis Comments Tx initiated  Hemodialysis Catheter Right Subclavian  Placement Date/Time: 12/03/17 1404   Placed prior to admission: No  Time Out: Correct patient;Correct site;Correct procedure  Maximum sterile barrier precautions: Hand hygiene;Sterile gloves;Cap;Large sterile sheet;Mask;Sterile gown  Site Prep: Chlorh...  Blue Lumen Status Infusing  Red Lumen Status Infusing

## 2017-12-03 NOTE — Progress Notes (Signed)
Post HD assessment    12/03/17 1849  Neurological  Level of Consciousness Alert  Orientation Level Oriented to person  Respiratory  Respiratory Pattern Regular;Unlabored  Chest Assessment Chest expansion symmetrical  Cardiac  ECG Monitor Yes  Vascular  R Radial Pulse +2  L Radial Pulse +2  Edema  (none noted)  Integumentary  Integumentary (WDL) X  Skin Color Appropriate for ethnicity  Musculoskeletal  Musculoskeletal (WDL) X  Generalized Weakness Yes  Assistive Device None  GU Assessment  Genitourinary (WDL) X  Genitourinary Symptoms  (HD)  Psychosocial  Psychosocial (WDL) X  Patient Behaviors Aggressive physically;Aggressive verbally;Uncooperative;Agitated;Restless;Irritable  Emotional support given Given to patient

## 2017-12-03 NOTE — Progress Notes (Signed)
Pre HD assessment   12/03/17 1525  Vital Signs  Temp 98.5 F (36.9 C)  Temp Source Oral  Pulse Rate 79  Pulse Rate Source Monitor  Resp (!) 22  BP 123/86  BP Location Right Arm  BP Method Automatic  Patient Position (if appropriate) Lying  Oxygen Therapy  SpO2 100 %  O2 Device Nasal Cannula  O2 Flow Rate (L/min) 3 L/min  Pain Assessment  Pain Scale 0-10  Pain Score 0  Dialysis Weight  Weight 58.9 kg (129 lb 13.6 oz)  Type of Weight Pre-Dialysis  Time-Out for Hemodialysis  What Procedure? HD  Pt Identifiers(min of two) First/Last Name;MRN/Account#  Correct Site? Yes  Correct Side? Yes  Correct Procedure? Yes  Consents Verified? Yes  Rad Studies Available? N/A  Safety Precautions Reviewed? Yes  Engineer, civil (consulting) Number  (3A)  Station Number 4  UF/Alarm Test Passed  Conductivity: Meter 13.8  Conductivity: Machine  13.9  pH 7.4  Reverse Osmosis main  Normal Saline Lot Number [id number]  Dialyzer Lot Number [id number]  Disposable Set Lot Number [id number]  Machine Temperature 98.6 F (37 C)  Musician and Audible Yes  Blood Lines Intact and Secured Yes  Pre Treatment Patient Checks  Vascular access used during treatment Catheter  Hepatitis B Surface Antigen Results Negative  Date Hepatitis B Surface Antigen Drawn 03/17/17  Hepatitis B Surface Antibody  (>10)  Date Hepatitis B Surface Antibody Drawn 03/17/17  Hemodialysis Consent Verified Yes  Hemodialysis Standing Orders Initiated Yes  ECG (Telemetry) Monitor On Yes  Prime Ordered Normal Saline  Length of  DialysisTreatment -hour(s) 3 Hour(s)  Dialyzer Elisio 17H NR  Dialysate 3K, 2.5 Ca  Dialysis Anticoagulant None  Dialysate Flow Ordered 600  Blood Flow Rate Ordered 300 mL/min  Ultrafiltration Goal 0.5 Liters  Pre Treatment Labs Phosphorus (PTH)  Dialysis Blood Pressure Support Ordered Normal Saline  Education / Care Plan  Dialysis Education Provided Yes  Documented Education in Care Plan Yes   Hemodialysis Catheter Right Subclavian  Placement Date/Time: 12/03/17 1404   Placed prior to admission: No  Time Out: Correct patient;Correct site;Correct procedure  Maximum sterile barrier precautions: Hand hygiene;Sterile gloves;Cap;Large sterile sheet;Mask;Sterile gown  Site Prep: Chlorh...  Site Condition No complications  Blue Lumen Status Heparin locked  Red Lumen Status Heparin locked  Purple Lumen Status N/A  Dressing Type Gauze/Drain sponge  Dressing Status Clean;Dry;Intact  Drainage Description None

## 2017-12-03 NOTE — Progress Notes (Signed)
[place] [facility] [place] NAME: Ruben Hudson    MR#:  [id number]  DATE OF BIRTH:  Jul 15, 1925  SUBJECTIVE:  CHIEF COMPLAINT:   Chief Complaint  Patient presents with  . Abdominal Pain   Status post permacath placement just now.  Still sedated.  On oxygen by nasal cannula 3 L. REVIEW OF SYSTEMS:  Review of Systems  Unable to perform ROS: Other   Sedated. DRUG ALLERGIES:   Allergies  Allergen Reactions  . Eliquis [Apixaban] Other (See Comments)    5 hours of nonstop nasal bleeding.  Required OVN stay in [place].  Occurred 04/2017  . Acyclovir And Related    VITALS:  Blood pressure 126/66, pulse 83, temperature 98.1 F (36.7 C), temperature source Oral, resp. rate 17, height 5\' 11"  (1.803 m), weight 131 lb (59.4 kg), SpO2 100 %. PHYSICAL EXAMINATION:  Physical Exam  Constitutional:  Severe malnutrition.  HENT:  Head: Normocephalic.  Eyes: Conjunctivae and EOM are normal. No scleral icterus.  Neck: Normal range of motion. Neck supple. No JVD present. No tracheal deviation present.  Cardiovascular: Normal rate, regular rhythm and normal heart sounds. Exam reveals no gallop.  No murmur heard. Pulmonary/Chest: Effort normal and breath sounds normal. No respiratory distress. He has no wheezes. He has no rales.  Abdominal: Soft. He exhibits no distension. There is no tenderness. There is no rebound.  No bowel sounds.  Musculoskeletal: He exhibits no edema or tenderness.  Neurological: No cranial nerve deficit.  Sedated, unable to exam.  Skin: No rash noted. No erythema.   LABORATORY PANEL:  Male CBC Recent Labs  Lab 12/02/17 0607  WBC 8.9  HGB 12.5*  HCT 37.2*  PLT 176   ------------------------------------------------------------------------------------------------------------------ Chemistries  Recent Labs  Lab 12/02/17 0607 12/03/17 0439  NA 144 145  K 4.8 4.6  CL 105 107  CO2 30 31  GLUCOSE 135* 99  BUN 40*  49*  CREATININE 3.29* 3.27*  CALCIUM 8.6* 8.7*  MG 2.5*  --   AST 29  --   ALT 19  --   ALKPHOS 68  --   BILITOT 0.8  --    RADIOLOGY:  No results found. ASSESSMENT AND PLAN:   1.  Acute incarcerated recurrent ventral hernia, status post surgical repair.  NPO and NGT. Continue management per surgical team.  2.  Acute respiratory failure status post surgery. Improving.  The patient was extubated and on oxygen 3 L.  NEB PRN.    3.  Chronic systolic CHF, currently clinically compensated.  Continue medical treatment.  4.  Paroxysmal atrial fibrillation, currently rate controlled, in sinus rhythm.   Continue aspirin and amiodarone.  5.  End-stage renal disease on peritoneal dialysis.  Status post permacath placement today and will get hemodialysis today.   6.  Elevated troponin level.  First 2 troponin levels are elevated at 0.05 and 0.06.  This is likely related to demand ischemia, from acute illness.  Severe malnutrition.  Dietitian consult if Patient can take p.o. All the records are reviewed and case discussed with Care Management/Social Worker. Management plans discussed with the patient, family and they are in agreement.  CODE STATUS: Full Code  TOTAL TIME TAKING CARE OF THIS PATIENT: 26 minutes.   More than 50% of the time was spent in counseling/coordination of care: YES  POSSIBLE D/C IN 3 DAYS, DEPENDING ON CLINICAL CONDITION.   [name] M.D on 12/03/2017 at 3:10 PM  Between 7am to  6pm - Pager - [phone]  After 6pm go to [email or web address] - [facility]

## 2017-12-03 NOTE — Anesthesia Postprocedure Evaluation (Signed)
Anesthesia Post Note  Patient: Ruben Flamenco Sr.  Procedure(s) Performed: HERNIA REPAIR VENTRAL ADULT (N/A Abdomen) possible SMALL BOWEL RESECTION (N/A )  Patient location during evaluation: PACU Anesthesia Type: General Level of consciousness: awake and alert Pain management: pain level controlled Vital Signs Assessment: post-procedure vital signs reviewed and stable Respiratory status: patient re-intubated Cardiovascular status: blood pressure returned to baseline and stable Anesthetic complications: no     Last Vitals:  Vitals:   12/03/17 0800 12/03/17 1016  BP: 136/75 133/69  Pulse: 79 77  Resp: 20 16  Temp: 36.8 C 36.7 C  SpO2: 100% 96%    Last Pain:  Vitals:   12/03/17 1016  TempSrc: Oral  PainSc: 0-No pain                 KEPHART,WILLIAM K

## 2017-12-03 NOTE — Progress Notes (Signed)
POD # 2 Extubated and doing better  no flatus AVSS  PE NAD, alert and conversant Abd: incisions healing well, mild distension w tympany and dec BS  A/P Doing well persistent ileus related to recent SBO Continue NGT for now Permcath today May move to the floor later today

## 2017-12-03 NOTE — Progress Notes (Signed)
Post HD assessment. Pt tolerated tx well without complication. Pt aggressive physically and verbally, sitter at bedside. Net UF 518, goal met.     12/03/17 1846  Vital Signs  Temp 98.5 F (36.9 C)  Temp Source Oral  Pulse Rate 86  Pulse Rate Source Monitor  Resp (!) 23  BP (!) 161/115  BP Location Right Arm  BP Method Automatic  Patient Position (if appropriate) Lying  Oxygen Therapy  SpO2 96 %  O2 Device Nasal Cannula  O2 Flow Rate (L/min) 3.5 L/min  Dialysis Weight  Weight 58.5 kg (128 lb 15.5 oz)  Type of Weight Post-Dialysis  Post-Hemodialysis Assessment  Rinseback Volume (mL) 250 mL  KECN 52.3 V  Dialyzer Clearance Lightly streaked  Duration of HD Treatment -hour(s) 3 hour(s)  Hemodialysis Intake (mL) 500 mL  UF Total -Machine (mL) 1018 mL  Net UF (mL) 518 mL  Tolerated HD Treatment Yes  Education / Care Plan  Dialysis Education Provided Yes  Documented Education in Care Plan Yes  Hemodialysis Catheter Right Subclavian  Placement Date/Time: 12/03/17 1404   Placed prior to admission: No  Time Out: Correct patient;Correct site;Correct procedure  Maximum sterile barrier precautions: Hand hygiene;Sterile gloves;Cap;Large sterile sheet;Mask;Sterile gown  Site Prep: Chlorh...  Site Condition No complications  Blue Lumen Status Heparin locked  Red Lumen Status Heparin locked  Purple Lumen Status N/A  Catheter fill solution Heparin 1000 units/ml  Catheter fill volume (Arterial) 1.5 cc  Catheter fill volume (Venous) 1.5  Dressing Type Gauze/Drain sponge  Dressing Status Clean;Dry;Intact  Drainage Description None  Post treatment catheter status Capped and Clamped

## 2017-12-03 NOTE — Progress Notes (Signed)
Pt is confused, trying to get out of bed, pulling on his cath/HD lines during tx initiation, pushing his NG tube deeper inside, exposing himself and is verbally aggressive. MD and RN are aware. RN will have a sitter come down so that pt can complete HD tx.

## 2017-12-03 NOTE — Progress Notes (Signed)
HD tx end   12/03/17 1835  Vital Signs  Pulse Rate 72  Pulse Rate Source Monitor  Resp (!) 24  BP 137/79  BP Location Right Arm  BP Method Automatic  Patient Position (if appropriate) Lying  Oxygen Therapy  SpO2 96 %  O2 Device Nasal Cannula  O2 Flow Rate (L/min) 3.5 L/min  During Hemodialysis Assessment  Dialysis Fluid Bolus Normal Saline  Bolus Amount (mL) 250 mL  Intra-Hemodialysis Comments Tx completed

## 2017-12-03 NOTE — Progress Notes (Signed)
CRITICAL CARE NOTE  CC  follow up respiratory failure  SUBJECTIVE Successfully extubated NG in place Lethargic but arousable No SOB  Plan for perm cath placement  BP 133/69   Pulse 77   Temp 98.1 F (36.7 C) (Oral)   Resp 16   Ht 5\' 11"  (1.803 m)   Wt 131 lb (59.4 kg)   SpO2 100%   BMI 18.27 kg/m    REVIEW OF SYSTEMS  PATIENT IS UNABLE TO PROVIDE COMPLETE REVIEW OF SYSTEM S DUE TO SEVERE CRITICAL ILLNESS AND ENCEPHALOPATHY   PHYSICAL EXAMINATION:  GENERAL:nad HEAD: Normocephalic, atraumatic.  EYES: Pupils equal, round, reactive to light.  No scleral icterus.  MOUTH: Moist mucosal membrane. NECK: Supple. No thyromegaly. No nodules. No JVD.  PULMONARY: +rhonchi CARDIOVASCULAR: S1 and S2. Regular rate and rhythm. No murmurs, rubs, or gallops.  GASTROINTESTINAL: Soft, nontender,  NEUROLOGIC: lethargic SKIN:intact,warm,dry  ASSESSMENT AND PLAN  Severe Hypoxic and Hypercapnic Respiratory Failure-resolved -oxygen as needed  S/p Incarcerated ventral hernia w incarcerated small bowel repair Follow up Gen surgery recs  ESRD ON HD pla for perm cath placement IHD as per nephrology   Ok to transfer to gen med floor   Isidor Bromell Patricia Pesa, M.D.  [facility]  Medical Director [place] Director [place] Department

## 2017-12-03 NOTE — Progress Notes (Signed)
Report given to Ruben Sparrow RN, for patient to be transferred to room 29 and wife was present for new room assignment as well as wife took all personal belongings from room. Patient taken to dialysis before going to new room.

## 2017-12-03 NOTE — Op Note (Signed)
[facility]   OPERATIVE NOTE     PROCEDURE: 1. Insertion of a right IJ tunneled dialysis catheter. 2. Catheter placement and cannulation under ultrasound and fluoroscopic guidance  PRE-OPERATIVE DIAGNOSIS: end-stage renal requiring hemodialysis  POST-OPERATIVE DIAGNOSIS: same as above  SURGEON: Hortencia Pilar  ANESTHESIA: Conscious sedation was administered under my direct supervision by the interventional radiology RN. IV Versed plus fentanyl were utilized. Continuous ECG, pulse oximetry and blood pressure was monitored throughout the entire procedure.  Conscious sedation was for a total of 33 minutes.  ESTIMATED BLOOD LOSS: Minimal  FINDING(S): 1.  Tips of the catheter in the right atrium on fluoroscopy 2.  No obvious pneumothorax on fluoroscopy  SPECIMEN(S):  none  INDICATIONS:   Ruben Oyama Sr. is a 84 y.o. male  presents with end stage renal disease.  Therefore, the patient requires a tunneled dialysis catheter placement.  The patient is informed of  the risks catheter placement include but are not limited to: bleeding, infection, central venous injury, pneumothorax, possible venous stenosis, possible malpositioning in the venous system, and possible infections related to long-term catheter presence.  The patient was aware of these risks and agreed to proceed.  DESCRIPTION: The patient was taken back to Special Procedure suite.  Prior to sedation, the patient was given IV antibiotics.  After obtaining adequate sedation, the patient was prepped and draped in the standard fashion for a chest or neck tunneled dialysis catheter placement.  Appropriate Time Out is called.     The the right neck and chest wall are then infiltrated with 1% Lidocaine with epinepherine.  A 19 cm tip to cuff palindrome catheter is then selected, opened on the back table and prepped. Ultrasound is placed in a sterile sleeve.  Under ultrasound guidance, the right internal jugular  vein is examined and is noted to be echolucent and easily compressible indicating patency.   An image is recorded for the permanent record.  The right internal jugular vein is cannulated with the microneedle under direct ultrasound vissualization.  A Microwire followed by a micro sheath is then inserted without difficulty.   A J-wire was then advanced under fluoroscopic guidance into the inferior vena cava and the wire was secured.  Small counter incision was then made at the wire insertion site. A small pocket was fashioned with blunt dissection to allow easier passage of the cuff.  The dilator and peel-away sheath are then advanced over the wire under fluoroscopic guidance. The catheters and advanced through the peel-away sheath. It is approximated to the chest wall after verifying the tips at the atrial caval junction and an exit site is selected.  Small incision is made at the selected exit site and the tunneling device was passed subcutaneously to the neck counter incision. Catheter is then pulled through the subcutaneous tunnel. The catheter is then verified for tip position under fluoroscopy, transected and the hub assembly connected.    Each port was tested by aspirating and flushing.  No resistance was noted.  Each port was then thoroughly flushed with heparinized saline.  The catheter was secured in placed with two interrupted stitches of 0 silk tied to the catheter.  The neck incision was closed with a U-stitch of 4-0 Monocryl.  The neck and chest incision were cleaned and sterile bandages applied including a Biopatch.  Each port was then packed with concentrated heparin (1000 Units/mL) at the manufacturer recommended volumes to each port.  Sterile caps were applied to each port.  On  completion fluoroscopy, the tips of the catheter were in the right atrium, and there was no evidence of pneumothorax.  COMPLICATIONS: None  CONDITION: Unchanged   Hortencia Pilar [facility] Office: [phone]   12/03/2017, 2:13 PM

## 2017-12-03 NOTE — Progress Notes (Signed)
[facility]  ROUNDING NOTE   Subjective:  Patient seen at bedside. Now extubated. Appreciate vascular surgery consultation. Awaiting PermCath placement.   Objective:  Vital signs in last 24 hours:  Temp:  [97.8 F (36.6 C)-98.7 F (37.1 C)] 98.3 F (36.8 C) (07/10 0800) Pulse Rate:  [70-102] 79 (07/10 0800) Resp:  [12-29] 20 (07/10 0800) BP: (111-137)/(50-90) 136/75 (07/10 0800) SpO2:  [95 %-100 %] 100 % (07/10 0800)  Weight change:  Filed Weights   12/01/17 1620 12/01/17 2306  Weight: 59 kg (130 lb) 59.8 kg (131 lb 13.4 oz)    Intake/Output: I/O last 3 completed shifts: In: 1056.5 [I.V.:949.8; NG/GT:30; IV Piggyback:76.7] Out: 101 [Urine:721; Emesis/NG output:50]   Intake/Output this shift:  Total I/O In: -  Out: 75 [Urine:75]  Physical Exam: General: No acute distress  Head: NG in place  Eyes: Anicteric  Neck: Supple, trachea midline  Lungs:  Clear to auscultation, normal effort  Heart: S1S2 no rubs  Abdomen:  Soft, small incision noted, C/D/I, PD catheter in place  Extremities: no peripheral edema.  Neurologic: Awake, alert, follows commands  Skin: No lesions  Access: PD catheter in place    Basic Metabolic Panel: Recent Labs  Lab 12/01/17 1706 12/02/17 0607 12/03/17 0439  NA 143 144 145  K 4.5 4.8 4.6  CL 101 105 107  CO2 34* 30 31  GLUCOSE 143* 135* 99  BUN 35* 40* 49*  CREATININE 3.18* 3.29* 3.27*  CALCIUM 9.1 8.6* 8.7*  MG  --  2.5*  --   PHOS  --  6.4*  --     Liver Function Tests: Recent Labs  Lab 12/01/17 1706 12/02/17 0607  AST 31 29  ALT 21 19  ALKPHOS 72 68  BILITOT 1.1 0.8  PROT 6.8 6.8  ALBUMIN 3.2* 3.1*   Recent Labs  Lab 12/01/17 1706  LIPASE 34   No results for input(s): AMMONIA in the last 168 hours.  CBC: Recent Labs  Lab 12/01/17 1706 12/02/17 0607  WBC 12.6* 8.9  HGB 12.7* 12.5*  HCT 37.1* 37.2*  MCV 96.9 98.7  PLT 185 176    Cardiac Enzymes: Recent Labs  Lab 12/01/17 1706  12/01/17 1901  TROPONINI 0.05* 0.06*    BNP: Invalid input(s): POCBNP  CBG: Recent Labs  Lab 12/01/17 2325 12/02/17 1503  GLUCAP 147* 127*    Microbiology: Results for orders placed or performed during the hospital encounter of 12/01/17  MRSA PCR Screening     Status: None   Collection Time: 12/01/17 11:32 PM  Result Value Ref Range Status   MRSA by PCR NEGATIVE NEGATIVE Final    Comment:        The GeneXpert MRSA Assay (FDA approved for NASAL specimens only), is one component of a comprehensive MRSA colonization surveillance program. It is not intended to diagnose MRSA infection nor to guide or monitor treatment for MRSA infections. Performed at [facility], [address] [place], [place] [address]     Coagulation Studies: Recent Labs    12/01/17 1901  LABPROT 13.7  INR 1.06    Urinalysis: No results for input(s): COLORURINE, LABSPEC, PHURINE, GLUCOSEU, HGBUR, BILIRUBINUR, KETONESUR, PROTEINUR, UROBILINOGEN, NITRITE, LEUKOCYTESUR in the last 72 hours.  Invalid input(s): APPERANCEUR    Imaging: Dg Chest 2 View  Result Date: 12/01/2017 CLINICAL DATA:  Nausea and vomiting since yesterday, swelling around calf site, distension to lower abdomen; history hypertension, CHF, stroke, post MI, former smoker, stage III chronic kidney disease EXAM:  CHEST - 2 VIEW COMPARISON:  08/01/2017 FINDINGS: Enlargement of cardiac silhouette with pulmonary vascular congestion. Atherosclerotic calcification aorta. Minimal peribronchial thickening with small BILATERAL pleural effusions and mild pulmonary edema, improved since previous exam. Minimal bibasilar atelectasis. No pneumothorax. Bones demineralized. IMPRESSION: Improved CHF with decreased bibasilar pleural effusions and atelectasis. Electronically Signed   By: Lavonia Dana M.D.   On: 12/01/2017 17:52   Ct Abdomen Pelvis W Contrast  Result Date: 12/01/2017 CLINICAL DATA:  84 year old male with lower abdominal  distension and swelling at peritoneal dialysis catheter site. Nausea vomiting since yesterday. EXAM: CT ABDOMEN AND PELVIS WITH CONTRAST TECHNIQUE: Multidetector CT imaging of the abdomen and pelvis was performed using the standard protocol following bolus administration of intravenous contrast. CONTRAST:  59mL OMNIPAQUE IOHEXOL 300 MG/ML  SOLN COMPARISON:  Noncontrast CT Abdomen and Pelvis 04/19/2017 and earlier. FINDINGS: Lower chest: Small bilateral pleural effusions are layering and subpulmonic and regressed compared to November 2018. Cardiomegaly appears stable. No pericardial effusion. Lung base atelectasis without consolidation. Hepatobiliary: Dependent sludge or vicarious contrast excretion to the gallbladder. Negative liver. No biliary ductal enlargement. Pancreas: Negative. Spleen: Negative. Adrenals/Urinary Tract: Normal adrenal glands. There is a degree of native renal atrophy, but symmetric bilateral renal enhancement and contrast excretion. No hydronephrosis or hydroureter. Diminutive and unremarkable urinary bladder. Stomach/Bowel: Partially decompressed rectosigmoid colon, which might account for the appearance of mild sigmoid wall thickening on series 2, image 58. Mild redundancy at the junction of the sigmoid and descending colon. The left colon has a somewhat medial course. Retained stool throughout the transverse and right colon. The hepatic flexure is mildly redundant. The terminal ileum is decompressed along with most small bowel loops in the pelvis. However, there are dilated proximal and mid small bowel loops which are fluid-filled and measure up to 34 millimeters diameter. An abrupt transition occurs at a small umbilical hernia which contains a knuckle of small bowel best seen on sagittal image 51, along with a small volume of free fluid. From here the downstream loops are decompressed and the upstream loops are dilated. This hernia occurs just superior and medial to the left PD catheter  percutaneous site, which appears unrelated. The proximal stomach is also dilated with fluid although the distal stomach and duodenum are relatively decompressed. No abdominal free air. Trace free fluid elsewhere. Vascular/Lymphatic: Aortoiliac calcified atherosclerosis, with atherosclerosis of the major aortic branches. Major arterial structures are patent. Portal venous system is patent. No lymphadenopathy. Reproductive: Prior right inguinal hernia repair with no adverse features. Other: Small volume pelvic free to fluid the peritoneal in proximity dialysis catheter. Musculoskeletal: No acute osseous abnormality identified. IMPRESSION: 1. Small incarcerated umbilical hernia resulting in High-grade Small Bowel Obstruction. This hernia and obstruction appear unrelated to the PD catheter. See sagittal image 51, coronal image 24. 2. No free air. Small volume of free fluid in the abdomen and pelvis which could be reactive due to #1 or peritoneal dialysis related. 3. Small bilateral pleural effusions are regressed compared to February 2018. 4.  Aortic Atherosclerosis (ICD10-I70.0). Electronically Signed   By: Genevie Ann M.D.   On: 12/01/2017 18:16   Portable Chest Xray  Result Date: 12/02/2017 CLINICAL DATA:  Respiratory failure. EXAM: PORTABLE CHEST 1 VIEW COMPARISON:  Radiograph of December 01, 2017. FINDINGS: Stable cardiomegaly. Endotracheal and nasogastric tubes are unchanged in position. Atherosclerosis of thoracic aorta is noted. No pneumothorax is noted. Stable left basilar atelectasis or infiltrate is noted with mild right pleural effusion. Minimal right pleural effusion is noted. Bony  thorax is unremarkable. IMPRESSION: Stable support apparatus. Stable left basilar opacity is noted with bilateral pleural effusions. Aortic Atherosclerosis (ICD10-I70.0). Electronically Signed   By: Marijo Conception, M.D.   On: 12/02/2017 07:56   Portable Chest X-ray  Result Date: 12/02/2017 CLINICAL DATA:  Respiratory distress EXAM:  PORTABLE CHEST 1 VIEW COMPARISON:  Chest x-ray earlier today FINDINGS: New large volume pneumoperitoneum. The patient is postop for hernia repair. Increased left lower lobe opacity with probable volume loss. There are small pleural effusions. No pneumothorax. Cardiomegaly. Endotracheal tube tip at the clavicular heads. The orogastric tube is in stable position. IMPRESSION: 1. Endotracheal and orogastric tubes in unremarkable position. 2. Worsening left lower lobe aeration. 3. Small pleural effusions. 4. Pneumoperitoneum in this postoperative patient. Electronically Signed   By: Monte Fantasia M.D.   On: 12/02/2017 00:03   Dg Abd Portable 1 View  Result Date: 12/01/2017 CLINICAL DATA:  Nasogastric tube placement EXAM: PORTABLE ABDOMEN - 1 VIEW COMPARISON:  CT from earlier today FINDINGS: Nasogastric tube tip over the upper stomach with side port at the GE junction. Gas distended small bowel loops with fluid levels as seen on prior CT. Cardiomegaly and small pleural effusions. IMPRESSION: Nasogastric tube tip in good position over the proximal stomach. Electronically Signed   By: Monte Fantasia M.D.   On: 12/01/2017 19:51     Medications:   .  ceFAZolin (ANCEF) IV    . famotidine (PEPCID) IV 20 mg (12/03/17 0917)  . norepinephrine (LEVOPHED) Adult infusion Stopped (12/02/17 0442)   . amiodarone  200 mg Per Tube Daily  . aspirin  81 mg Per Tube Daily  . chlorhexidine  15 mL Mouth Rinse BID  . Chlorhexidine Gluconate Cloth  6 each Topical Q0600  . heparin  5,000 Units Subcutaneous Q8H  . ipratropium-albuterol  3 mL Nebulization Q6H  . mouth rinse  15 mL Mouth Rinse q12n4p  . midodrine  5 mg Per Tube TID  . tamsulosin  0.4 mg Oral QPM   ALPRAZolam, bisacodyl, hydrALAZINE, morphine injection, naLOXone (NARCAN)  injection, ondansetron (ZOFRAN) IV, ondansetron **OR** [DISCONTINUED] ondansetron (ZOFRAN) IV, sennosides  Assessment/ Plan:  84 y.o. male congestive heart failure, ESRD on peritoneal  dialysis, anemia of chronic kidney disease, secondary hyperparathyroidism, hyperlipidemia, history of myocardial infarction who presented with incarcerated ventral hernia and is status post repair.  1.  ESRD on peritoneal dialysis as an outpatient, transitioning temporarily to PD given hernia repair.  2.  Acute respiratory failure postoperative. 3.  Anemia of chronic kidney disease hemoglobin 12.5. 4.  Secondary hyperparathyroidism. 5.  Incarcerated ventral hernia status post repair. 6.  Hypotension.  Plan: Patient underwent repair of ventral hernia on December 01, 2017.  Appears to have tolerated this well.  He was extubated yesterday.  Currently awake and alert.  Appreciate vascular surgery assistance.  PermCath to be placed today and thereafter we will follow with hemodialysis session.  Outpatient planning in process and patient will be going to [facility] for hemodialysis temporarily until his ventral hernia repair heals.   LOS: 2 Eugean Arnott 7/10/20199:30 AM

## 2017-12-03 NOTE — Progress Notes (Signed)
Pre HD assessment    12/03/17 1526  Neurological  Level of Consciousness Alert  Orientation Level Oriented to person  Respiratory  Respiratory Pattern Regular;Unlabored  Chest Assessment Chest expansion symmetrical  Cardiac  ECG Monitor Yes  Vascular  R Radial Pulse +2  L Radial Pulse +2  Edema  (none noted)  Integumentary  Integumentary (WDL) X  Skin Color Appropriate for ethnicity  Musculoskeletal  Musculoskeletal (WDL) X  Generalized Weakness Yes  Assistive Device None  GU Assessment  Genitourinary (WDL) X  Genitourinary Symptoms  (HD)  Psychosocial  Psychosocial (WDL) X  Patient Behaviors Uncooperative;Agitated;Aggressive verbally;Restless (confused)  Needs Expressed Physical  Emotional support given Given to patient

## 2017-12-04 DIAGNOSIS — E43 Unspecified severe protein-calorie malnutrition: Secondary | ICD-10-CM

## 2017-12-04 LAB — PARATHYROID HORMONE, INTACT (NO CA): PTH: 56 pg/mL (ref 15–65)

## 2017-12-04 MED ORDER — HYDROCODONE-ACETAMINOPHEN 5-325 MG PO TABS: 1.0000 | ORAL_TABLET | ORAL | Status: DC | PRN

## 2017-12-04 MED ORDER — RENA-VITE PO TABS
1.0000 | ORAL_TABLET | Freq: Every day | ORAL | Status: DC
  Administered 2017-12-04 – 2017-12-05 (×2): 1 via ORAL
  Filled 2017-12-04 (×2): qty 1

## 2017-12-04 MED ORDER — IPRATROPIUM-ALBUTEROL 0.5-2.5 (3) MG/3ML IN SOLN
3.0000 mL | Freq: Three times a day (TID) | RESPIRATORY_TRACT | Status: DC
  Administered 2017-12-04 – 2017-12-06 (×5): 3 mL via RESPIRATORY_TRACT
  Filled 2017-12-04 (×7): qty 3

## 2017-12-04 MED ORDER — HYDROMORPHONE HCL 1 MG/ML IJ SOLN: 0.5000 mg | Freq: Four times a day (QID) | INTRAMUSCULAR | Status: DC | PRN

## 2017-12-04 MED ORDER — ALBUTEROL SULFATE (2.5 MG/3ML) 0.083% IN NEBU: 2.5000 mg | INHALATION_SOLUTION | RESPIRATORY_TRACT | Status: DC | PRN

## 2017-12-04 NOTE — Care Management Important Message (Signed)
Copy of signed IM left with patient and family in room. 

## 2017-12-04 NOTE — Progress Notes (Signed)
[facility]  ROUNDING NOTE   Subjective:  Patient was quite agitated during hemodialysis yesterday. Appears less agitated today. Still has some underlying confusion.   Objective:  Vital signs in last 24 hours:  Temp:  [98.1 F (36.7 C)-98.5 F (36.9 C)] 98.1 F (36.7 C) (07/11 1337) Pulse Rate:  [71-114] 114 (07/11 1337) Resp:  [14-24] 14 (07/11 1337) BP: (108-161)/(61-115) 109/64 (07/11 1337) SpO2:  [90 %-100 %] 99 % (07/11 1347) Weight:  [57.4 kg (126 lb 8.7 oz)-58.9 kg (129 lb 13.6 oz)] 57.4 kg (126 lb 8.7 oz) (07/11 1116)  Weight change:  Filed Weights   12/03/17 1525 12/03/17 1846 12/04/17 1116  Weight: 58.9 kg (129 lb 13.6 oz) 58.5 kg (128 lb 15.5 oz) 57.4 kg (126 lb 8.7 oz)    Intake/Output: I/O last 3 completed shifts: In: 281.7 [IV Piggyback:281.7] Out: 1219 [Urine:701; Other:518]   Intake/Output this shift:  Total I/O In: 360 [P.O.:360] Out: -   Physical Exam: General: No acute distress  Head: [place]/AT hearing intact OM moist  Eyes: Anicteric  Neck: Supple, trachea midline  Lungs:  Clear to auscultation, normal effort  Heart: S1S2 no rubs  Abdomen:  Soft, NTND, BS present  Extremities: no peripheral edema.  Neurologic: Awake but confused, follows commands  Skin: No lesions  Access: PD catheter in place, right internal jugular PermCath in place    Basic Metabolic Panel: Recent Labs  Lab 12/01/17 1706 12/02/17 0607 12/03/17 0439 12/03/17 1443  NA 143 144 145  --   K 4.5 4.8 4.6  --   CL 101 105 107  --   CO2 34* 30 31  --   GLUCOSE 143* 135* 99  --   BUN 35* 40* 49*  --   CREATININE 3.18* 3.29* 3.27*  --   CALCIUM 9.1 8.6* 8.7*  --   MG  --  2.5*  --   --   PHOS  --  6.4*  --  4.6    Liver Function Tests: Recent Labs  Lab 12/01/17 1706 12/02/17 0607  AST 31 29  ALT 21 19  ALKPHOS 72 68  BILITOT 1.1 0.8  PROT 6.8 6.8  ALBUMIN 3.2* 3.1*   Recent Labs  Lab 12/01/17 1706  LIPASE 34   No results for input(s): AMMONIA  in the last 168 hours.  CBC: Recent Labs  Lab 12/01/17 1706 12/02/17 0607  WBC 12.6* 8.9  HGB 12.7* 12.5*  HCT 37.1* 37.2*  MCV 96.9 98.7  PLT 185 176    Cardiac Enzymes: Recent Labs  Lab 12/01/17 1706 12/01/17 1901  TROPONINI 0.05* 0.06*    BNP: Invalid input(s): POCBNP  CBG: Recent Labs  Lab 12/01/17 2325 12/02/17 1503 12/03/17 1427  GLUCAP 147* 127* 38    Microbiology: Results for orders placed or performed during the hospital encounter of 12/01/17  MRSA PCR Screening     Status: None   Collection Time: 12/01/17 11:32 PM  Result Value Ref Range Status   MRSA by PCR NEGATIVE NEGATIVE Final    Comment:        The GeneXpert MRSA Assay (FDA approved for NASAL specimens only), is one component of a comprehensive MRSA colonization surveillance program. It is not intended to diagnose MRSA infection nor to guide or monitor treatment for MRSA infections. Performed at [facility], [address] [place], [place] [address]     Coagulation Studies: Recent Labs    12/01/17 1901  LABPROT 13.7  INR 1.06  Urinalysis: No results for input(s): COLORURINE, LABSPEC, PHURINE, GLUCOSEU, HGBUR, BILIRUBINUR, KETONESUR, PROTEINUR, UROBILINOGEN, NITRITE, LEUKOCYTESUR in the last 72 hours.  Invalid input(s): APPERANCEUR    Imaging: No results found.   Medications:   . sodium chloride    . sodium chloride    . famotidine (PEPCID) IV Stopped (12/04/17 1058)   . amiodarone  200 mg Per Tube Daily  . aspirin  81 mg Per Tube Daily  . chlorhexidine  15 mL Mouth Rinse BID  . Chlorhexidine Gluconate Cloth  6 each Topical Q0600  . heparin  5,000 Units Subcutaneous Q8H  . ipratropium-albuterol  3 mL Nebulization TID  . mouth rinse  15 mL Mouth Rinse q12n4p  . midodrine  5 mg Per Tube TID  . multivitamin  1 tablet Oral QHS  . tamsulosin  0.4 mg Oral QPM   sodium chloride, sodium chloride, albuterol, ALPRAZolam, alteplase, bisacodyl, heparin,  hydrALAZINE, HYDROcodone-acetaminophen, lidocaine (PF), lidocaine-prilocaine, morphine injection, naLOXone (NARCAN)  injection, ondansetron (ZOFRAN) IV, ondansetron **OR** [DISCONTINUED] ondansetron (ZOFRAN) IV, pentafluoroprop-tetrafluoroeth, sennosides  Assessment/ Plan:  84 y.o. male congestive heart failure, ESRD on peritoneal dialysis, anemia of chronic kidney disease, secondary hyperparathyroidism, hyperlipidemia, history of myocardial infarction who presented with incarcerated ventral hernia and is status post repair.  1.  ESRD on peritoneal dialysis as an outpatient, transitioning temporarily to PD given hernia repair.  2.  Acute respiratory failure postoperative, now extubated.  3.  Anemia of chronic kidney disease hemoglobin 12.5. 4.  Secondary hyperparathyroidism. 5.  Incarcerated ventral hernia status post repair December 01, 2017. 6.  Hypotension.  Plan: Patient has been transition to floor care.  He was rather agitated yesterday during dialysis.  We will plan for dialysis again tomorrow.  We will plan for outpatient dialysis 3 times a week for 3 hours at a time.  Hold off on Epogen at this time.  Follow-up bone mineral metabolism parameters tomorrow.  Further plan as patient progresses.   LOS: 3 Ruben Hudson 7/11/20192:37 PM

## 2017-12-04 NOTE — Care Management (Signed)
Amanda Morris dialysis liaison notified of admission.    

## 2017-12-04 NOTE — Progress Notes (Signed)
[facility] [facility] [place] NAME: Ruben Hudson    MR#:  [id number]  DATE OF BIRTH:  June 09, 1925  SUBJECTIVE:  CHIEF COMPLAINT:   Chief Complaint  Patient presents with  . Abdominal Pain   The patient complains of generalized weakness.  NGT was removed.  On oxygen by nasal cannula 3 L. REVIEW OF SYSTEMS:  Review of Systems  Constitutional: Positive for malaise/fatigue. Negative for chills and fever.  HENT: Negative for sore throat.   Eyes: Negative for blurred vision and double vision.  Respiratory: Negative for cough, hemoptysis, shortness of breath, wheezing and stridor.   Cardiovascular: Negative for chest pain, palpitations, orthopnea and leg swelling.  Gastrointestinal: Negative for abdominal pain, blood in stool, diarrhea, melena, nausea and vomiting.  Genitourinary: Negative for dysuria, flank pain and hematuria.  Musculoskeletal: Negative for back pain and joint pain.  Skin: Negative for rash.  Neurological: Negative for dizziness, sensory change, focal weakness, seizures, loss of consciousness, weakness and headaches.  Endo/Heme/Allergies: Negative for polydipsia.  Psychiatric/Behavioral: Negative for depression. The patient is not nervous/anxious.    Sedated. DRUG ALLERGIES:   Allergies  Allergen Reactions  . Eliquis [Apixaban] Other (See Comments)    5 hours of nonstop nasal bleeding.  Required OVN stay in [place].  Occurred 04/2017  . Acyclovir And Related    VITALS:  Blood pressure 109/64, pulse (!) 114, temperature 98.1 F (36.7 C), temperature source Axillary, resp. rate 14, height 5\' 11"  (1.803 m), weight 126 lb 8.7 oz (57.4 kg), SpO2 99 %. PHYSICAL EXAMINATION:  Physical Exam  Constitutional: He is oriented to person, place, and time.  Severe malnutrition.  HENT:  Head: Normocephalic.  Eyes: Conjunctivae and EOM are normal. No scleral icterus.  Neck: Normal range of motion. Neck supple. No JVD present. No  tracheal deviation present.  Cardiovascular: Normal rate, regular rhythm and normal heart sounds. Exam reveals no gallop.  No murmur heard. Pulmonary/Chest: Effort normal and breath sounds normal. No respiratory distress. He has no wheezes. He has no rales.  Abdominal: Soft. Bowel sounds are normal. He exhibits no distension. There is no tenderness. There is no rebound.  Musculoskeletal: Normal range of motion. He exhibits no edema or tenderness.  Neurological: He is alert and oriented to person, place, and time. No cranial nerve deficit.  Skin: No rash noted. No erythema.   LABORATORY PANEL:  Male CBC Recent Labs  Lab 12/02/17 0607  WBC 8.9  HGB 12.5*  HCT 37.2*  PLT 176   ------------------------------------------------------------------------------------------------------------------ Chemistries  Recent Labs  Lab 12/02/17 0607 12/03/17 0439  NA 144 145  K 4.8 4.6  CL 105 107  CO2 30 31  GLUCOSE 135* 99  BUN 40* 49*  CREATININE 3.29* 3.27*  CALCIUM 8.6* 8.7*  MG 2.5*  --   AST 29  --   ALT 19  --   ALKPHOS 68  --   BILITOT 0.8  --    RADIOLOGY:  No results found. ASSESSMENT AND PLAN:   1.  Acute incarcerated recurrent ventral hernia, status post surgical repair.  He was on NPO and NGT.  NG tube was removed and full liquid per Dr. Dahlia Byes.  2.  Acute respiratory failure status post surgery. Improving.  The patient was extubated and on oxygen 3 L.  NEB PRN.    3.  Chronic systolic CHF, currently clinically compensated.  Continue medical treatment.  4.  Paroxysmal atrial fibrillation, currently rate controlled, in sinus rhythm.  Continue aspirin and amiodarone.  5.  End-stage renal disease on peritoneal dialysis.  Status post permacath placement and got hemodialysis yesterday. HD tomorrow per Dr. Holley Raring.   6.  Elevated troponin level.  First 2 troponin levels are elevated at 0.05 and 0.06.  This is likely related to demand ischemia, from acute  illness.  Severe malnutrition.  Dietitian consult.  Discussed with Dr. Dahlia Byes All the records are reviewed and case discussed with Care Management/Social Worker. Management plans discussed with the patient, his wife and they are in agreement.  CODE STATUS: Full Code  TOTAL TIME TAKING CARE OF THIS PATIENT: 26 minutes.   More than 50% of the time was spent in counseling/coordination of care: YES  POSSIBLE D/C IN 2-3 DAYS, DEPENDING ON CLINICAL CONDITION.   [name] M.D on 12/04/2017 at 5:01 PM  Between 7am to 6pm - Pager - [phone]  After 6pm go to [email or web address] - [facility]

## 2017-12-04 NOTE — Progress Notes (Signed)
POD # 3 Had permacath yesterday Now w Hospital delirium AVSS NGT out ( pt pulled it out) HE is having Flatus, no emesis AVSS  PE NAD, pleasantly confused, non focal Abd: soft, NT, incision c/d/i, no infection  + BS  A/P Doing well  Resolving SBO Advance diet May talk to Hospitalist as we better be serve on their service as all surgical issues have resolved

## 2017-12-04 NOTE — Progress Notes (Signed)
Called Dr. Jannifer Franklin due to increased agitation and impulsivity of patient.  Patient also pulled his NG tube out.  I asked for something to help patient sleep.  Orders were given for 1mg  iv of ativan once.  The ativan was ineffective due to leaking iv access.  I called Dr. Jannifer Franklin once again for IM haldol.  Order was given.  Medication helped patient a little but still restless and impulsive.  Telemonitor is in place and Air cabin crew was present until 2am.  Will continue to monitor patient.  Christene Slates 12/04/2017  3:39 AM

## 2017-12-04 NOTE — Progress Notes (Signed)
Initial Nutrition Assessment  DOCUMENTATION CODES:   Severe malnutrition in context of chronic illness  INTERVENTION:   Recommend Ensure Enlive po TID with diet advancement, each supplement provides 350 kcal and 20 grams of protein  Rena-vite daily  Magic cup TID with diet advancement, each supplement provides 290 kcal and 9 grams of protein  Recommend liberal diet   Pt likely at moderate risk for refeeding; recommend monitor K, Mg and P when oral intake improves.  NUTRITION DIAGNOSIS:   Severe Malnutrition related to chronic illness(ESRD on PD, CHF, advanced age ) as evidenced by severe muscle depletion, severe fat depletion.  GOAL:   Patient will meet greater than or equal to 90% of their needs  MONITOR:   PO intake, Supplement acceptance, Diet advancement, Labs, Weight trends, Skin, I & O's  REASON FOR ASSESSMENT:   Other (Comment)(Low BMI)    ASSESSMENT:   84 y.o. male congestive heart failure, ESRD on peritoneal dialysis, anemia of chronic kidney disease, secondary hyperparathyroidism, hyperlipidemia, history of myocardial infarction who presented with incarcerated ventral hernia and is status post repair 7/8   Met with pt and family in room today. Pt is a poor historian so history obtained mainly from family members at bedside. Family reports pt with fair appetite and oral intake at home. Pt likes to snack a lot on crackers and protein bars at home but really does not eat large meals. Pt reports he does not like supplements but is willing to drink them because he knows he needs the protein. Pt denies any trouble chewing or swallowing. Per chart, pt has lost 17lbs(12%) in 5 months; this is severe weight loss. Some wt loss could be attributed to fluid changes but pt appears severely malnourished on exam today. NGT removed by patient. Pt initiated on clear liquid diet today. Recommend supplements upon diet advancement. Plan is to hold PD to allow abdomen to heal. RIJ perm  cath placed 7/10 and HD initiated.  Pt ate only a few bites of breakfast today; does not like clear liquids. Pt likely at moderate risk for refeeding; recommend monitor K, Mg and P when oral intake improves.  Medications reviewed and include: aspirin, heparin, pepcid   Labs reviewed: K 4.6 wnl, P 4.6 wnl- 7/10 Mg 2.5(H)- 7/9 BNP 1553(H)- 7/9 iPTH- 56- 7/10  NUTRITION - FOCUSED PHYSICAL EXAM:    Most Recent Value  Orbital Region  Severe depletion  Upper Arm Region  Severe depletion  Thoracic and Lumbar Region  Severe depletion  Buccal Region  Severe depletion  Temple Region  Severe depletion  Clavicle Bone Region  Severe depletion  Clavicle and Acromion Bone Region  Severe depletion  Scapular Bone Region  Severe depletion  Dorsal Hand  Severe depletion  Patellar Region  Severe depletion  Anterior Thigh Region  Severe depletion  Posterior Calf Region  Severe depletion  Edema (RD Assessment)  None  Hair  Reviewed  Eyes  Reviewed  Mouth  Reviewed  Skin  Reviewed  Nails  Reviewed     Diet Order:   Diet Order           Diet clear liquid Room service appropriate? Yes; Fluid consistency: Thin  Diet effective now         EDUCATION NEEDS:   Education needs have been addressed  Skin:  Skin Assessment: Reviewed RN Assessment(Incision abdomen )  Last BM:  PTA  Height:   Ht Readings from Last 1 Encounters:  12/03/17 '5\' 11"'  (1.803 m)  Weight:   Wt Readings from Last 1 Encounters:  12/04/17 126 lb 8.7 oz (57.4 kg)    Ideal Body Weight:  78.2 kg  BMI:  Body mass index is 17.65 kg/m.  Estimated Nutritional Needs:   Kcal:  1800-2100kcal/day   Protein:  93-105g/day   Fluid:  per MD  Koleen Distance MS, RD, LDN Pager #- [phone] Office#- [phone] After Hours Pager: [phone]

## 2017-12-04 NOTE — Progress Notes (Signed)
Advanced Care Plan.  Purpose of Encounter: CODE STATUS. Parties in Attendance: The patient, his wife, sister and me. Patient's Decisional Capacity: Yes. Medical Story: Ruben Hudson  is a 84 y.o. male with a known history of Afib, CHF, end-stage renal disease on peritoneal dialysis, hypertension and other multiple medical problems.  The patient was admitted for small bowel obstruction due to incarcerated hernia. He is status post surgery.  He is still on oxygen due to respiratory failure.  I discussed with the patient about his condition, prognosis and CODE STATUS.  He stated that he want to be resuscitated and intubated.  Plan:  Code Status: Full code. Time spent discussing advance care planning: 17 minutes.

## 2017-12-05 LAB — PHOSPHORUS: PHOSPHORUS: 2.6 mg/dL (ref 2.5–4.6)

## 2017-12-05 LAB — CBC
HCT: 32.9 % — ABNORMAL LOW (ref 40.0–52.0)
Hemoglobin: 11.1 g/dL — ABNORMAL LOW (ref 13.0–18.0)
MCH: 32.5 pg (ref 26.0–34.0)
MCHC: 33.8 g/dL (ref 32.0–36.0)
MCV: 95.9 fL (ref 80.0–100.0)
Platelets: 158 10*3/uL (ref 150–440)
RBC: 3.43 MIL/uL — AB (ref 4.40–5.90)
RDW: 15.3 % — AB (ref 11.5–14.5)
WBC: 5.8 10*3/uL (ref 3.8–10.6)

## 2017-12-05 LAB — BASIC METABOLIC PANEL
ANION GAP: 10 (ref 5–15)
BUN: 53 mg/dL — ABNORMAL HIGH (ref 8–23)
CALCIUM: 8.3 mg/dL — AB (ref 8.9–10.3)
CHLORIDE: 104 mmol/L (ref 98–111)
CO2: 25 mmol/L (ref 22–32)
CREATININE: 2.61 mg/dL — AB (ref 0.61–1.24)
GFR calc Af Amer: 23 mL/min — ABNORMAL LOW (ref >60)
GFR calc non Af Amer: 20 mL/min — ABNORMAL LOW (ref >60)
Glucose, Bld: 107 mg/dL — ABNORMAL HIGH (ref 70–99)
Potassium: 3.7 mmol/L (ref 3.5–5.1)
SODIUM: 139 mmol/L (ref 135–145)

## 2017-12-05 MED ORDER — PANTOPRAZOLE SODIUM 40 MG PO TBEC
40.0000 mg | DELAYED_RELEASE_TABLET | Freq: Every day | ORAL | Status: DC
  Administered 2017-12-05 – 2017-12-06 (×2): 40 mg via ORAL
  Filled 2017-12-05 (×2): qty 1

## 2017-12-05 MED ORDER — ASPIRIN 81 MG PO CHEW
81.0000 mg | CHEWABLE_TABLET | Freq: Every day | ORAL | Status: DC
  Administered 2017-12-05 – 2017-12-06 (×2): 81 mg via ORAL
  Filled 2017-12-05 (×2): qty 1

## 2017-12-05 MED ORDER — SENNOSIDES 8.8 MG/5ML PO SYRP
5.0000 mL | ORAL_SOLUTION | Freq: Two times a day (BID) | ORAL | Status: DC | PRN
  Filled 2017-12-05: qty 5

## 2017-12-05 MED ORDER — MIDODRINE HCL 5 MG PO TABS
5.0000 mg | ORAL_TABLET | Freq: Three times a day (TID) | ORAL | Status: DC
  Administered 2017-12-05 – 2017-12-06 (×3): 5 mg via ORAL
  Filled 2017-12-05 (×6): qty 1

## 2017-12-05 MED ORDER — ENSURE ENLIVE PO LIQD
237.0000 mL | Freq: Two times a day (BID) | ORAL | Status: DC
  Administered 2017-12-06: 237 mL via ORAL

## 2017-12-05 MED ORDER — AMIODARONE HCL 200 MG PO TABS
200.0000 mg | ORAL_TABLET | Freq: Every day | ORAL | Status: DC
  Administered 2017-12-05 – 2017-12-06 (×2): 200 mg via ORAL
  Filled 2017-12-05 (×2): qty 1

## 2017-12-05 NOTE — Progress Notes (Signed)
[facility] [facility] at [place] NAME: Ruben Hudson    MR#:  [id number]  DATE OF BIRTH:  Oct 12, 1925  SUBJECTIVE:  CHIEF COMPLAINT:   Chief Complaint  Patient presents with  . Abdominal Pain   The patient complains of generalized weakness.  He tolerated diet.  On oxygen by nasal cannula 3 L.  Shortness of breath and A. fib with RVR last night. REVIEW OF SYSTEMS:  Review of Systems  Constitutional: Positive for malaise/fatigue. Negative for chills and fever.  HENT: Negative for sore throat.   Eyes: Negative for blurred vision and double vision.  Respiratory: Negative for cough, hemoptysis, shortness of breath, wheezing and stridor.   Cardiovascular: Negative for chest pain, palpitations, orthopnea and leg swelling.  Gastrointestinal: Negative for abdominal pain, blood in stool, diarrhea, melena, nausea and vomiting.  Genitourinary: Negative for dysuria, flank pain and hematuria.  Musculoskeletal: Negative for back pain and joint pain.  Skin: Negative for rash.  Neurological: Negative for dizziness, sensory change, focal weakness, seizures, loss of consciousness, weakness and headaches.  Endo/Heme/Allergies: Negative for polydipsia.  Psychiatric/Behavioral: Negative for depression. The patient is not nervous/anxious.    Sedated. DRUG ALLERGIES:   Allergies  Allergen Reactions  . Eliquis [Apixaban] Other (See Comments)    5 hours of nonstop nasal bleeding.  Required OVN stay in [place].  Occurred 04/2017  . Acyclovir And Related    VITALS:  Blood pressure 110/76, pulse 78, temperature 98 F (36.7 C), temperature source Oral, resp. rate 16, height 5\' 11"  (1.803 m), weight 126 lb 8.7 oz (57.4 kg), SpO2 98 %. PHYSICAL EXAMINATION:  Physical Exam  Constitutional: He is oriented to person, place, and time.  Severe malnutrition.  HENT:  Head: Normocephalic.  Eyes: Conjunctivae and EOM are normal. No scleral icterus.  Neck: Normal range of  motion. Neck supple. No JVD present. No tracheal deviation present.  Cardiovascular: Normal rate, regular rhythm and normal heart sounds. Exam reveals no gallop.  No murmur heard. Pulmonary/Chest: Effort normal. No respiratory distress. He has no wheezes. He has rales.  Abdominal: Soft. Bowel sounds are normal. He exhibits no distension. There is no tenderness. There is no rebound.  Musculoskeletal: Normal range of motion. He exhibits no edema or tenderness.  Neurological: He is alert and oriented to person, place, and time. No cranial nerve deficit.  Skin: No rash noted. No erythema.   LABORATORY PANEL:  Male CBC Recent Labs  Lab 12/05/17 0522  WBC 5.8  HGB 11.1*  HCT 32.9*  PLT 158   ------------------------------------------------------------------------------------------------------------------ Chemistries  Recent Labs  Lab 12/02/17 0607  12/05/17 0522  NA 144   < > 139  K 4.8   < > 3.7  CL 105   < > 104  CO2 30   < > 25  GLUCOSE 135*   < > 107*  BUN 40*   < > 53*  CREATININE 3.29*   < > 2.61*  CALCIUM 8.6*   < > 8.3*  MG 2.5*  --   --   AST 29  --   --   ALT 19  --   --   ALKPHOS 68  --   --   BILITOT 0.8  --   --    < > = values in this interval not displayed.   RADIOLOGY:  No results found. ASSESSMENT AND PLAN:   1.  Acute incarcerated recurrent ventral hernia, status post surgical repair.  He was on NPO  and NGT.  NG tube was removed and full liquid per Dr. Dahlia Byes. He tolerated diet so far.  2.  Acute on chronic respiratory failure status post surgery. The patient was intubated and put on ventilation.  He was extubated and on oxygen 3 L.  NEB PRN.   He is on home oxygen 2 L.   3.  Chronic systolic CHF, currently clinically compensated.  Continue medical treatment.  4.  Paroxysmal atrial fibrillation, currently rate controlled. Continue aspirin and amiodarone.  Cardiology consult due to A. fib RVR last night.  5.  End-stage renal disease on peritoneal  dialysis.  Status post permacath placement and got hemodialysis. HD today per Dr. Holley Raring.   6.  Elevated troponin level.  First 2 troponin levels are elevated at 0.05 and 0.06.  This is likely related to demand ischemia, from acute illness.  Severe malnutrition.  Follow dietitian's recommendation.  Generalized weakness.  PT evaluation. All the records are reviewed and case discussed with Care Management/Social Worker. Management plans discussed with the patient, his wife and they are in agreement.  CODE STATUS: Full Code  TOTAL TIME TAKING CARE OF THIS PATIENT: 32 minutes.   More than 50% of the time was spent in counseling/coordination of care: YES  POSSIBLE D/C IN 1-2 DAYS, DEPENDING ON CLINICAL CONDITION.   [name] M.D on 12/05/2017 at 1:30 PM  Between 7am to 6pm - Pager - [phone]  After 6pm go to [email or web address] - [facility]

## 2017-12-05 NOTE — Evaluation (Signed)
Physical Therapy Evaluation Patient Details Name: Ruben Yusuf Sr. MRN: [id number] DOB: Jan 21, 1926 Today's Date: 12/05/2017   History of Present Illness  Pt is a 84 y/o M who presented with abdominal pain.  Pt was found to have incarcerated ventral hernia and bowel obstruction s/p surgical repair.  Pt with NGT which has since been removed. Pt with a-fib and L bundle branch block.  Pt's PMH includes MI, R ankle fx surgery, on peritoneal dialysis.     Clinical Impression  Pt admitted with above diagnosis. Pt currently with functional limitations due to the deficits listed below (see PT Problem List). Mr. Ruben Hudson presents with full strength BUE and BLE.  He does demonstrate instability with sit<>stand and while ambulating in room (requiring occasional min assist when ambulating).  Pt's SpO2 remained at or above 94% on 3L O2 throughout session.  Pt will benefit from skilled PT to increase their independence and safety with mobility to allow discharge to the venue listed below.      Follow Up Recommendations Home health PT;Supervision for mobility/OOB    Equipment Recommendations  None recommended by PT    Recommendations for Other Services       Precautions / Restrictions Precautions Precautions: Fall;Other (comment) Precaution Comments: Monitor O2, HR, peritoneal dialysis and R chest dialysis cath Restrictions Weight Bearing Restrictions: No      Mobility  Bed Mobility Overal bed mobility: Needs Assistance Bed Mobility: Supine to Sit     Supine to sit: Min assist;HOB elevated     General bed mobility comments: Min assist to elevate trunk  Transfers Overall transfer level: Needs assistance Equipment used: Straight cane Transfers: Sit to/from Stand Sit to Stand: Min guard         General transfer comment: Pt demonstrates instability but no LOB using SPC.  Close min guard provided for safety.   Ambulation/Gait Ambulation/Gait assistance: Min guard;Min assist Gait Distance  (Feet): 60 Feet Assistive device: Straight cane Gait Pattern/deviations: Step-through pattern;Narrow base of support;Staggering right;Staggering left     General Gait Details: Pt demonstrates instability which improves with distance.  Pt ambulates with narrow BOS.  SpO2 reads 85% on 3L O2; however, once fingers changed for pulse ox it reads 94%.   Stairs            Wheelchair Mobility    Modified Rankin (Stroke Patients Only)       Balance Overall balance assessment: Needs assistance Sitting-balance support: No upper extremity supported;Feet supported Sitting balance-Leahy Scale: Good     Standing balance support: No upper extremity supported;During functional activity Standing balance-Leahy Scale: Fair Standing balance comment: Pt able to stand statically without UE support but relies on UE support for dynamic activities                             Pertinent Vitals/Pain Pain Assessment: 0-10 Pain Score: 1  Pain Location: dialysis cath site R chest Pain Descriptors / Indicators: Discomfort Pain Intervention(s): Limited activity within patient's tolerance;Monitored during session;Repositioned    Home Living Family/patient expects to be discharged to:: Private residence Living Arrangements: Spouse/significant other Available Help at Discharge: Family Type of Home: House Home Access: Stairs to enter Entrance Stairs-Rails: Right(wall on the L) Entrance Stairs-Number of Steps: 4 Home Layout: One level Home Equipment: Cane - single point;Shower seat - built in;Bedside commode;Walker - 2 wheels;Grab bars - toilet;Grab bars - tub/shower      Prior Function Level of Independence: Independent  Comments: 2L O2 at baseline.  No falls in the past 6 months.  Independent with ADLs.  Still driving the lawn mower.      Hand Dominance        Extremity/Trunk Assessment   Upper Extremity Assessment Upper Extremity Assessment: Overall WFL for tasks  assessed    Lower Extremity Assessment Lower Extremity Assessment: Overall WFL for tasks assessed       Communication   Communication: No difficulties  Cognition Arousal/Alertness: Awake/alert Behavior During Therapy: Agitated;WFL for tasks assessed/performed(Agitated if healthcare provider speaks too loudly) Overall Cognitive Status: Impaired/Different from baseline Area of Impairment: Orientation;Problem solving                 Orientation Level: Disoriented to;Place           Problem Solving: Slow processing;Requires verbal cues        General Comments General comments (skin integrity, edema, etc.): Daughter and wife present during evaluation    Exercises Other Exercises Other Exercises: Encouraged pt to ambulate at least 3x/day with nursing staff   Assessment/Plan    PT Assessment Patient needs continued PT services  PT Problem List Decreased strength;Decreased activity tolerance;Decreased balance;Decreased knowledge of use of DME;Decreased safety awareness;Decreased cognition;Cardiopulmonary status limiting activity;Pain       PT Treatment Interventions DME instruction;Gait training;Stair training;Functional mobility training;Therapeutic activities;Therapeutic exercise;Balance training;Neuromuscular re-education;Cognitive remediation;Patient/family education    PT Goals (Current goals can be found in the Care Plan section)  Acute Rehab PT Goals Patient Stated Goal: to return home PT Goal Formulation: With patient Time For Goal Achievement: 12/19/17 Potential to Achieve Goals: Good    Frequency Min 2X/week   Barriers to discharge        Co-evaluation               AM-PAC PT "6 Clicks" Daily Activity  Outcome Measure Difficulty turning over in bed (including adjusting bedclothes, sheets and blankets)?: A Little Difficulty moving from lying on back to sitting on the side of the bed? : Unable Difficulty sitting down on and standing up from a  chair with arms (e.g., wheelchair, bedside commode, etc,.)?: A Little Help needed moving to and from a bed to chair (including a wheelchair)?: A Little Help needed walking in hospital room?: A Little Help needed climbing 3-5 steps with a railing? : A Little 6 Click Score: 16    End of Session Equipment Utilized During Treatment: Gait belt;Oxygen Activity Tolerance: Patient tolerated treatment well Patient left: in chair;with call bell/phone within reach;with chair alarm set;with family/visitor present Nurse Communication: Mobility status;Other (comment)(SpO2) PT Visit Diagnosis: Unsteadiness on feet (R26.81);Other abnormalities of gait and mobility (R26.89)    Time: 5573-2202 PT Time Calculation (min) (ACUTE ONLY): 30 min   Charges:   PT Evaluation $PT Eval Low Complexity: 1 Low PT Treatments $Gait Training: 8-22 mins   PT G Codes:        Collie Siad PT, DPT 12/05/2017, 4:38 PM

## 2017-12-05 NOTE — Progress Notes (Signed)
This note also relates to the following rows which could not be included: Pulse Rate - Cannot attach notes to unvalidated device data Resp - Cannot attach notes to unvalidated device data SpO2 - Cannot attach notes to unvalidated device data  Hd started  

## 2017-12-05 NOTE — Care Management (Signed)
Per Elvera Bicker HD liaison patient outpatient HD schedule is as follows.  MWF [place]  4:30 pm.  PT consult pending

## 2017-12-05 NOTE — Consult Note (Signed)
Reason for Consult: Atrial fibrillation tachycardia Referring Physician: Ouida Sills primary,  Cardiologist Dr. [name] is an 84 y.o. male.  HPI: Patient is a 84 year old white male history of end-stage renal disease primarily on peritoneal dialysis but also on occasional hemodialysis.  Patient is postop incarcerated ventral hernia.  Patient has done reasonably well with minimal abdominal discomfort the patient has a history of atrial fibrillation and had an episode of significant shortness of breath.  Patient denies any significant palpitations or tachycardia denies any chest pain has a known history of heart swell and is been compliant with his medication  Past Medical History:  Diagnosis Date  . Acute pulmonary edema ([place]) 02/26/2013  . Anxiety   . Arthritis    little  . Benign prostatic hyperplasia with urinary obstruction 02/24/2012  . BP (high blood pressure) 10/31/2015  . Cataract extraction status of left eye   . Cataract extraction status of right eye   . Cerebrovascular accident (CVA) ([place]) 04/24/2012   affected right eye, limited peripheral vision  . Chronic kidney disease 07/25/2017   stage 3  . Congestive heart failure ([place]) 10/31/2015  . Dyspnea    uses continuous o2  . Dysrhythmia    AF  . HLD (hyperlipidemia) 10/31/2015  . Hydrocele 02/25/2012  . Myocardial infarction ([place])    per patient, small MI  . Renal insufficiency   . Unstable angina pectoris ([place]) 02/26/2013  . Urge incontinence of urine 05/30/2014  . Urinary urgency 10/31/2015    Past Surgical History:  Procedure Laterality Date  . ANKLE FRACTURE SURGERY Right 2006   metal in ankle, screws and plate  . BOWEL RESECTION N/A 12/01/2017   Procedure: possible SMALL BOWEL RESECTION;  Surgeon: Jules Husbands, MD;  Location: [facility];  Service: General;  Laterality: N/A;  . CAPD INSERTION N/A 08/01/2017   Procedure: LAPAROSCOPIC INSERTION CONTINUOUS AMBULATORY PERITONEAL DIALYSIS  (CAPD) CATHETER;  Surgeon:  Katha Cabal, MD;  Location: [facility];  Service: Vascular;  Laterality: N/A;  . CIRCUMCISION N/A 11/13/2015   Procedure: CIRCUMCISION ADULT;  Surgeon: Hollice Espy, MD;  Location: [facility];  Service: Urology;  Laterality: N/A;  . DIALYSIS/PERMA CATHETER INSERTION N/A 03/17/2017   Procedure: DIALYSIS/PERMA CATHETER INSERTION;  Surgeon: Algernon Huxley, MD;  Location: [place] [facility];  Service: Cardiovascular;  Laterality: N/A;  . DIALYSIS/PERMA CATHETER INSERTION N/A 12/03/2017   Procedure: DIALYSIS/PERMA CATHETER INSERTION;  Surgeon: Katha Cabal, MD;  Location: [place] [facility];  Service: Cardiovascular;  Laterality: N/A;  . DIALYSIS/PERMA CATHETER REMOVAL N/A 09/18/2017   Procedure: DIALYSIS/PERMA CATHETER REMOVAL;  Surgeon: Algernon Huxley, MD;  Location: [place] [facility];  Service: Cardiovascular;  Laterality: N/A;  . EYE SURGERY Bilateral    cataract extractions  . HERNIA REPAIR    . HYDROCELE EXCISION Bilateral 11/13/2015   Procedure: HYDROCELECTOMY ADULT;  Surgeon: Hollice Espy, MD;  Location: [facility];  Service: Urology;  Laterality: Bilateral;  . TONSILLECTOMY  1950's  . TONSILLECTOMY    . [place]   with mesh  . VARICOCELECTOMY  1948  . VASECTOMY    . VENTRAL HERNIA REPAIR N/A 12/01/2017   Procedure: HERNIA REPAIR VENTRAL ADULT;  Surgeon: Jules Husbands, MD;  Location: [facility];  Service: General;  Laterality: N/A;    Family History  Problem Relation Age of Onset  . Kidney cancer Neg Hx   . Prostate cancer Neg Hx     Social History:  reports that  he quit smoking about 63 years ago. His smoking use included cigarettes. He has a 20.00 pack-year smoking history. He has never used smokeless tobacco. He reports that he does not drink alcohol or use drugs.  Allergies:  Allergies  Allergen Reactions  . Eliquis [Apixaban] Other (See Comments)    5 hours of nonstop nasal bleeding.  Required OVN stay in [place].  Occurred 04/2017  .  Acyclovir And Related     Medications: I have reviewed the patient's current medications.  Results for orders placed or performed during the hospital encounter of 12/01/17 (from the past 48 hour(s))  CBC     Status: Abnormal   Collection Time: 12/05/17  5:22 AM  Result Value Ref Range   WBC 5.8 3.8 - 10.6 K/uL   RBC 3.43 (L) 4.40 - 5.90 MIL/uL   Hemoglobin 11.1 (L) 13.0 - 18.0 g/dL   HCT 32.9 (L) 40.0 - 52.0 %   MCV 95.9 80.0 - 100.0 fL   MCH 32.5 26.0 - 34.0 pg   MCHC 33.8 32.0 - 36.0 g/dL   RDW 15.3 (H) 11.5 - 14.5 %   Platelets 158 150 - 440 K/uL    Comment: Performed at [facility], [address], [place], [place] [address]  Basic metabolic panel     Status: Abnormal   Collection Time: 12/05/17  5:22 AM  Result Value Ref Range   Sodium 139 135 - 145 mmol/L   Potassium 3.7 3.5 - 5.1 mmol/L   Chloride 104 98 - 111 mmol/L    Comment: Please note change in reference range.   CO2 25 22 - 32 mmol/L   Glucose, Bld 107 (H) 70 - 99 mg/dL    Comment: Please note change in reference range.   BUN 53 (H) 8 - 23 mg/dL    Comment: Please note change in reference range.   Creatinine, Ser 2.61 (H) 0.61 - 1.24 mg/dL   Calcium 8.3 (L) 8.9 - 10.3 mg/dL   GFR calc non Af Amer 20 (L) >60 mL/min   GFR calc Af Amer 23 (L) >60 mL/min    Comment: (NOTE) The eGFR has been calculated using the CKD EPI equation. This calculation has not been validated in all clinical situations. eGFR's persistently <60 mL/min signify possible Chronic Kidney Disease.    Anion gap 10 5 - 15    Comment: Performed at [facility], [address] [place], [place] [address]    No results found.  Review of Systems  Constitutional: Positive for diaphoresis, malaise/fatigue and weight loss.  HENT: Positive for congestion.   Eyes: Negative.   Respiratory: Positive for shortness of breath.   Cardiovascular: Negative.   Genitourinary: Negative.   Musculoskeletal: Negative.   Skin: Negative.    Neurological: Positive for dizziness.  Endo/Heme/Allergies: Negative.   Psychiatric/Behavioral: Negative.    Blood pressure 110/76, pulse 78, temperature 98 F (36.7 C), temperature source Oral, resp. rate 16, height '5\' 11"'  (3.151 m), weight 126 lb 8.7 oz (57.4 kg), SpO2 98 %. Physical Exam  Nursing note and vitals reviewed. Constitutional: He is oriented to person, place, and time. He appears well-developed and well-nourished.  HENT:  Head: Normocephalic and atraumatic.  Cardiovascular: Normal rate, S1 normal, S2 normal and normal pulses. An irregularly irregular rhythm present. Exam reveals gallop.  Murmur heard.  Systolic murmur is present with a grade of 2/6. Respiratory: Effort normal and breath sounds normal.  GI: There is generalized tenderness.  Bandage in abdominal area  with mild tenderness  Musculoskeletal: Normal range of motion.  Neurological: He is alert and oriented to person, place, and time. He has normal reflexes.  Skin: Skin is warm and dry.    Assessment/Plan: Atrial fibrillation Left bundle branch block End-stage renal disease on dialysis Postop from incarcerated hernia Congestive heart failure compensated . Plan Agree with postoperative care Consider rate control for A. Fib Continue amiodarone Consider anticoagulation for A. Fib Agree with dialysis care Continue physical therapy Continue therapy for congestive heart Have the patient follow-up with cardiology as an outpatient  Ruben Hudson Ruben Hudson 12/05/2017, 3:43 PM

## 2017-12-05 NOTE — Progress Notes (Signed)
[facility]  ROUNDING NOTE   Subjective:  Patient seen at bedside. Due for hemodialysis today. Day he is oriented to self, place, and partially to time.   Objective:  Vital signs in last 24 hours:  Temp:  [97.6 F (36.4 C)-98.1 F (36.7 C)] 98 F (36.7 C) (07/12 1155) Pulse Rate:  [78-119] 78 (07/12 1155) Resp:  [14-20] 16 (07/12 1155) BP: (83-110)/(59-76) 110/76 (07/12 1155) SpO2:  [90 %-100 %] 98 % (07/12 1155)  Weight change: -2.021 kg (-4 lb 7.3 oz) Filed Weights   12/03/17 1525 12/03/17 1846 12/04/17 1116  Weight: 58.9 kg (129 lb 13.6 oz) 58.5 kg (128 lb 15.5 oz) 57.4 kg (126 lb 8.7 oz)    Intake/Output: I/O last 3 completed shifts: In: 540 [P.O.:540] Out: 480 [Urine:480]   Intake/Output this shift:  Total I/O In: 240 [P.O.:240] Out: -   Physical Exam: General: No acute distress  Head: [place]/AT hearing intact OM moist  Eyes: Anicteric  Neck: Supple, trachea midline  Lungs:  Clear to auscultation, normal effort  Heart: S1S2 no rubs  Abdomen:  Soft, NTND, BS present  Extremities: no peripheral edema.  Neurologic: Awake but confused, follows commands  Skin: No lesions  Access: PD catheter in place, right internal jugular PermCath in place    Basic Metabolic Panel: Recent Labs  Lab 12/01/17 1706 12/02/17 0607 12/03/17 0439 12/03/17 1443 12/05/17 0522  NA 143 144 145  --  139  K 4.5 4.8 4.6  --  3.7  CL 101 105 107  --  104  CO2 34* 30 31  --  25  GLUCOSE 143* 135* 99  --  107*  BUN 35* 40* 49*  --  53*  CREATININE 3.18* 3.29* 3.27*  --  2.61*  CALCIUM 9.1 8.6* 8.7*  --  8.3*  MG  --  2.5*  --   --   --   PHOS  --  6.4*  --  4.6  --     Liver Function Tests: Recent Labs  Lab 12/01/17 1706 12/02/17 0607  AST 31 29  ALT 21 19  ALKPHOS 72 68  BILITOT 1.1 0.8  PROT 6.8 6.8  ALBUMIN 3.2* 3.1*   Recent Labs  Lab 12/01/17 1706  LIPASE 34   No results for input(s): AMMONIA in the last 168 hours.  CBC: Recent Labs  Lab  12/01/17 1706 12/02/17 0607 12/05/17 0522  WBC 12.6* 8.9 5.8  HGB 12.7* 12.5* 11.1*  HCT 37.1* 37.2* 32.9*  MCV 96.9 98.7 95.9  PLT 185 176 158    Cardiac Enzymes: Recent Labs  Lab 12/01/17 1706 12/01/17 1901  TROPONINI 0.05* 0.06*    BNP: Invalid input(s): POCBNP  CBG: Recent Labs  Lab 12/01/17 2325 12/02/17 1503 12/03/17 1427  GLUCAP 147* 127* 52    Microbiology: Results for orders placed or performed during the hospital encounter of 12/01/17  MRSA PCR Screening     Status: None   Collection Time: 12/01/17 11:32 PM  Result Value Ref Range Status   MRSA by PCR NEGATIVE NEGATIVE Final    Comment:        The GeneXpert MRSA Assay (FDA approved for NASAL specimens only), is one component of a comprehensive MRSA colonization surveillance program. It is not intended to diagnose MRSA infection nor to guide or monitor treatment for MRSA infections. Performed at [facility], [address] [place], [place] [address]     Coagulation Studies: No results for input(s): LABPROT, INR in  the last 72 hours.  Urinalysis: No results for input(s): COLORURINE, LABSPEC, PHURINE, GLUCOSEU, HGBUR, BILIRUBINUR, KETONESUR, PROTEINUR, UROBILINOGEN, NITRITE, LEUKOCYTESUR in the last 72 hours.  Invalid input(s): APPERANCEUR    Imaging: No results found.   Medications:   . sodium chloride    . sodium chloride     . amiodarone  200 mg Oral Daily  . aspirin  81 mg Oral Daily  . chlorhexidine  15 mL Mouth Rinse BID  . Chlorhexidine Gluconate Cloth  6 each Topical Q0600  . heparin  5,000 Units Subcutaneous Q8H  . ipratropium-albuterol  3 mL Nebulization TID  . mouth rinse  15 mL Mouth Rinse q12n4p  . midodrine  5 mg Oral TID  . multivitamin  1 tablet Oral QHS  . pantoprazole  40 mg Oral Daily  . tamsulosin  0.4 mg Oral QPM   sodium chloride, sodium chloride, albuterol, ALPRAZolam, alteplase, bisacodyl, heparin, hydrALAZINE, HYDROcodone-acetaminophen,  lidocaine (PF), lidocaine-prilocaine, naLOXone (NARCAN)  injection, ondansetron (ZOFRAN) IV, ondansetron **OR** [DISCONTINUED] ondansetron (ZOFRAN) IV, pentafluoroprop-tetrafluoroeth, sennosides  Assessment/ Plan:  84 y.o. male congestive heart failure, ESRD on peritoneal dialysis, anemia of chronic kidney disease, secondary hyperparathyroidism, hyperlipidemia, history of myocardial infarction who presented with incarcerated ventral hernia and is status post repair.  1.  ESRD on peritoneal dialysis as an outpatient, transitioning temporarily to PD given hernia repair.  2.  Acute respiratory failure postoperative, now extubated.  3.  Anemia of chronic kidney disease hemoglobin 11.1 4.  Secondary hyperparathyroidism. 5.  Incarcerated ventral hernia status post repair December 01, 2017. 6.  Hypotension.  Plan: Patient due for hemodialysis today.  Orders have been prepared.  He will be going to the [facility] dialysis center on Monday Wednesday Friday at 4:30 PM.  Continues to do well post extubation.  We will continue to hold off on Epogen at this time.  We also plan to follow-up serum phosphorus today.  Appears to be doing well from ventral hernia repair.   LOS: 4 Nainika Newlun 7/12/201912:03 PM

## 2017-12-05 NOTE — Progress Notes (Signed)
This note also relates to the following rows which could not be included: Pulse Rate - Cannot attach notes to unvalidated device data Resp - Cannot attach notes to unvalidated device data BP - Cannot attach notes to unvalidated device data  Hd completed  

## 2017-12-05 NOTE — Progress Notes (Signed)
POD # 4 Now w Hospital delirium AVSS HE is having Flatus, no emesis taking PO AVSS  PE NAD, pleasantly confused, non focal Abd: soft, NT, incision c/d/i, no infection  + BS  A/P Doing well  Resolved SBO No surgical issues, Dispo per Nephrology and Hospitalist

## 2017-12-05 NOTE — Progress Notes (Signed)
Pt on TELE, call from Tele clerk that Pt is in [place]. EKG done showing Pt in atrial tachycardia. MD notified. Per MD Pt has a Hx of intermittent Aflutter. Continue to monitor.

## 2017-12-06 NOTE — Progress Notes (Signed)
[facility]  ROUNDING NOTE   Subjective:  Pt sitting up in chair. In good spirits.  Wife and son at bedside.    Objective:  Vital signs in last 24 hours:  Temp:  [97.5 F (36.4 C)-98 F (36.7 C)] 97.9 F (36.6 C) (07/13 0545) Pulse Rate:  [37-116] 68 (07/13 0545) Resp:  [16-39] 20 (07/13 0545) BP: (83-134)/(40-102) 112/54 (07/13 0545) SpO2:  [93 %-100 %] 100 % (07/13 0545) Weight:  [55.5 kg (122 lb 5.7 oz)] 55.5 kg (122 lb 5.7 oz) (07/13 0545)  Weight change: -1.9 kg (-4 lb 3 oz) Filed Weights   12/03/17 1846 12/04/17 1116 12/06/17 0545  Weight: 58.5 kg (128 lb 15.5 oz) 57.4 kg (126 lb 8.7 oz) 55.5 kg (122 lb 5.7 oz)    Intake/Output: I/O last 3 completed shifts: In: 540 [P.O.:540] Out: 1000 [Other:1000]   Intake/Output this shift:  Total I/O In: -  Out: 50 [Urine:50]  Physical Exam: General: No acute distress  Head: [place]/AT hearing intact OM moist  Eyes: Anicteric  Neck: Supple, trachea midline  Lungs:  Clear to auscultation, normal effort  Heart: S1S2 no rubs  Abdomen:  Soft, NTND, BS present  Extremities: no peripheral edema.  Neurologic: Awake alert follows commands  Skin: No lesions  Access: PD catheter in place, right internal jugular PermCath in place    Basic Metabolic Panel: Recent Labs  Lab 12/01/17 1706 12/02/17 0607 12/03/17 0439 12/03/17 1443 12/05/17 0522 12/05/17 1739  NA 143 144 145  --  139  --   K 4.5 4.8 4.6  --  3.7  --   CL 101 105 107  --  104  --   CO2 34* 30 31  --  25  --   GLUCOSE 143* 135* 99  --  107*  --   BUN 35* 40* 49*  --  53*  --   CREATININE 3.18* 3.29* 3.27*  --  2.61*  --   CALCIUM 9.1 8.6* 8.7*  --  8.3*  --   MG  --  2.5*  --   --   --   --   PHOS  --  6.4*  --  4.6  --  2.6    Liver Function Tests: Recent Labs  Lab 12/01/17 1706 12/02/17 0607  AST 31 29  ALT 21 19  ALKPHOS 72 68  BILITOT 1.1 0.8  PROT 6.8 6.8  ALBUMIN 3.2* 3.1*   Recent Labs  Lab 12/01/17 1706  LIPASE 34   No  results for input(s): AMMONIA in the last 168 hours.  CBC: Recent Labs  Lab 12/01/17 1706 12/02/17 0607 12/05/17 0522  WBC 12.6* 8.9 5.8  HGB 12.7* 12.5* 11.1*  HCT 37.1* 37.2* 32.9*  MCV 96.9 98.7 95.9  PLT 185 176 158    Cardiac Enzymes: Recent Labs  Lab 12/01/17 1706 12/01/17 1901  TROPONINI 0.05* 0.06*    BNP: Invalid input(s): POCBNP  CBG: Recent Labs  Lab 12/01/17 2325 12/02/17 1503 12/03/17 1427  GLUCAP 147* 127* 57    Microbiology: Results for orders placed or performed during the hospital encounter of 12/01/17  MRSA PCR Screening     Status: None   Collection Time: 12/01/17 11:32 PM  Result Value Ref Range Status   MRSA by PCR NEGATIVE NEGATIVE Final    Comment:        The GeneXpert MRSA Assay (FDA approved for NASAL specimens only), is one component of a comprehensive MRSA colonization surveillance program. It  is not intended to diagnose MRSA infection nor to guide or monitor treatment for MRSA infections. Performed at [facility] [address] [place], [place] [address]     Coagulation Studies: No results for input(s): LABPROT, INR in the last 72 hours.  Urinalysis: No results for input(s): COLORURINE, LABSPEC, PHURINE, GLUCOSEU, HGBUR, BILIRUBINUR, KETONESUR, PROTEINUR, UROBILINOGEN, NITRITE, LEUKOCYTESUR in the last 72 hours.  Invalid input(s): APPERANCEUR    Imaging: No results found.   Medications:   . sodium chloride    . sodium chloride     . amiodarone  200 mg Oral Daily  . aspirin  81 mg Oral Daily  . chlorhexidine  15 mL Mouth Rinse BID  . Chlorhexidine Gluconate Cloth  6 each Topical Q0600  . feeding supplement (ENSURE ENLIVE)  237 mL Oral BID BM  . heparin  5,000 Units Subcutaneous Q8H  . ipratropium-albuterol  3 mL Nebulization TID  . mouth rinse  15 mL Mouth Rinse q12n4p  . midodrine  5 mg Oral TID  . multivitamin  1 tablet Oral QHS  . pantoprazole  40 mg Oral Daily  . tamsulosin  0.4 mg Oral QPM    sodium chloride, sodium chloride, albuterol, ALPRAZolam, alteplase, bisacodyl, heparin, hydrALAZINE, HYDROcodone-acetaminophen, lidocaine (PF), lidocaine-prilocaine, naLOXone (NARCAN)  injection, ondansetron (ZOFRAN) IV, ondansetron **OR** [DISCONTINUED] ondansetron (ZOFRAN) IV, pentafluoroprop-tetrafluoroeth, sennosides  Assessment/ Plan:  84 y.o. male congestive heart failure, ESRD on peritoneal dialysis, anemia of chronic kidney disease, secondary hyperparathyroidism, hyperlipidemia, history of myocardial infarction who presented with incarcerated ventral hernia and is status post repair.  1.  ESRD on peritoneal dialysis as an outpatient, transitioning temporarily to PD given hernia repair.  2.  Acute respiratory failure postoperative, now extubated.  3.  Anemia of chronic kidney disease hemoglobin 11.1 4.  Secondary hyperparathyroidism. 5.  Incarcerated ventral hernia status post repair December 01, 2017. 6.  Hypotension.  Plan: Patient underwent HD yesterday, tolerated well, no actue indication for HD today.  Outpt dialysis set up at [facility] on MWF at 4:30pm.  Hopefully he will require hemodialysis for a short period of time and can transition back to PD soon.  Overall doing well at this time.     LOS: 5 Kamry Faraci 7/13/201911:20 AM

## 2017-12-06 NOTE — Progress Notes (Signed)
[place] [facility] [place] NAME: Ruben Hudson    MR#:  [id number]  DATE OF BIRTH:  December 31, 1925  SUBJECTIVE:  CHIEF COMPLAINT:   Chief Complaint  Patient presents with  . Abdominal Pain   The patient has no complains.  He tolerated diet.  On oxygen by nasal cannula 3 L.  REVIEW OF SYSTEMS:  Review of Systems  Constitutional: Positive for malaise/fatigue. Negative for chills and fever.  HENT: Negative for sore throat.   Eyes: Negative for blurred vision and double vision.  Respiratory: Negative for cough, hemoptysis, shortness of breath, wheezing and stridor.   Cardiovascular: Negative for chest pain, palpitations, orthopnea and leg swelling.  Gastrointestinal: Negative for abdominal pain, blood in stool, diarrhea, melena, nausea and vomiting.  Genitourinary: Negative for dysuria, flank pain and hematuria.  Musculoskeletal: Negative for back pain and joint pain.  Skin: Negative for rash.  Neurological: Negative for dizziness, sensory change, focal weakness, seizures, loss of consciousness, weakness and headaches.  Endo/Heme/Allergies: Negative for polydipsia.  Psychiatric/Behavioral: Negative for depression. The patient is not nervous/anxious.    Sedated. DRUG ALLERGIES:   Allergies  Allergen Reactions  . Eliquis [Apixaban] Other (See Comments)    5 hours of nonstop nasal bleeding.  Required OVN stay in [place].  Occurred 04/2017  . Acyclovir And Related    VITALS:  Blood pressure (!) 112/54, pulse 68, temperature 97.9 F (36.6 C), temperature source Oral, resp. rate 20, height 5\' 11"  (1.803 m), weight 122 lb 5.7 oz (55.5 kg), SpO2 100 %. PHYSICAL EXAMINATION:  Physical Exam  Constitutional: He is oriented to person, place, and time.  Severe malnutrition.  HENT:  Head: Normocephalic.  Eyes: Conjunctivae and EOM are normal. No scleral icterus.  Neck: Normal range of motion. Neck supple. No JVD present. No tracheal deviation  present.  Cardiovascular: Normal rate, regular rhythm and normal heart sounds. Exam reveals no gallop.  No murmur heard. Pulmonary/Chest: Effort normal. No respiratory distress. He has no wheezes. He has no rales.  Abdominal: Soft. Bowel sounds are normal. He exhibits no distension. There is no tenderness. There is no rebound.  Musculoskeletal: Normal range of motion. He exhibits no edema or tenderness.  Neurological: He is alert and oriented to person, place, and time. No cranial nerve deficit.  Skin: No rash noted. No erythema.   LABORATORY PANEL:  Male CBC Recent Labs  Lab 12/05/17 0522  WBC 5.8  HGB 11.1*  HCT 32.9*  PLT 158   ------------------------------------------------------------------------------------------------------------------ Chemistries  Recent Labs  Lab 12/02/17 0607  12/05/17 0522  NA 144   < > 139  K 4.8   < > 3.7  CL 105   < > 104  CO2 30   < > 25  GLUCOSE 135*   < > 107*  BUN 40*   < > 53*  CREATININE 3.29*   < > 2.61*  CALCIUM 8.6*   < > 8.3*  MG 2.5*  --   --   AST 29  --   --   ALT 19  --   --   ALKPHOS 68  --   --   BILITOT 0.8  --   --    < > = values in this interval not displayed.   RADIOLOGY:  No results found. ASSESSMENT AND PLAN:   1.  Acute incarcerated recurrent ventral hernia, status post surgical repair.  He was on NPO and NGT.  NG tube was removed and full  liquid per Dr. Dahlia Byes. He tolerated soft diet so far.  2.  Acute on chronic respiratory failure status post surgery. The patient was intubated and put on ventilation.  He was extubated and on oxygen 3 L.  NEB PRN.   He is on home oxygen 2 L.   3.  Chronic systolic CHF, currently clinically compensated.  Continue medical treatment.  4.  Paroxysmal atrial fibrillation, currently rate controlled. Continue aspirin and amiodarone per Dr. Clayborn Bigness.  5.  End-stage renal disease on peritoneal dialysis.  Status post permacath placement and got hemodialysis. HD today per Dr.  Holley Raring.   6.  Elevated troponin level.  First 2 troponin levels are elevated at 0.05 and 0.06.  This is likely related to demand ischemia, from acute illness.  Severe malnutrition.  Follow dietitian's recommendation.  Generalized weakness.  PT evaluation: HHPT. Medically stable, sign off. All the records are reviewed and case discussed with Care Management/Social Worker. Management plans discussed with the patient, his wife and they are in agreement.  CODE STATUS: Full Code  TOTAL TIME TAKING CARE OF THIS PATIENT: 36 minutes.   More than 50% of the time was spent in counseling/coordination of care: YES  Discharge today, DEPENDING ON CLINICAL CONDITION.   [name] M.D on 12/06/2017 at 10:11 AM  Between 7am to 6pm - Pager - [phone]  After 6pm go to [email or web address] - [facility]

## 2017-12-06 NOTE — Discharge Summary (Signed)
Patient ID: Ruben Pandy Sr. MRN: [id number] DOB/AGE: November 13, 1925 84 y.o.  Admit date: 12/01/2017 Discharge date: 12/06/2017   Discharge Diagnoses:  Active Problems:   Incarcerated umbilical hernia   Ventral hernia with bowel obstruction   Protein-calorie malnutrition, severe   Procedures: Open repair incarcerated ventral hernia  Hospital Course: Is an 84 year old male came in with an acute abdomen and bowel obstruction from an strangulated ventral hernia.  Was taken emergently to the operating room for a primary repair and did well.  Immediately postoperatively failed extubation in PACU and had to be reintubated.  He spent 2 nights in the ICU for respiratory failure.  He was extubated successfully and he went into A. fib that was appropriately controlled.  We obtain cardiology consultation that recommended continuation of amiodarone and suggestion of anticoagulation.  However the patient is debilitated a high fall for risk and I think that the risk of anti-what correlation outweigh the benefits. He was transition from peritoneal dialysis to hemodialysis via a right IJ permacath the vascular surgery place. He did develop delirium in the hospital that improved.  At the time of discharge she was ambulating with assistant and with a walker.  He was tolerating regular diet.  His vital signs were stable he was passing gas.  Physical exam at time of discharge showed a debilitated male in no acute distress back to baseline.  Oriented to place but disoriented to time and date.  No focal neurological deficits.  Chest clear to auscultation bilaterally.  Abdomen: Soft nontender incision healing well without evidence of recurrence.  No peritonitis.  Extremities well-perfused.  Condition of the patient at time of discharge was stable.  He will be arranged for hemodialysis and as an outpatient at least for 1 to 2 weeks before he can resume peritoneal dialysis given his recent surgery.  I have talked to the wife  regarding physical therapy and she mentioned that they have walkers at home and they have plenty of help that can get Ruben Hudson in optimal shape. Condition of the patient at the time of discharge was stable  Consults: Nephrology, cardiology and hospitalist, vascular surgery  Disposition: Discharge disposition: 01-Home or Self Care       Discharge Instructions    Call MD for:  difficulty breathing, headache or visual disturbances   Complete by:  As directed    Call MD for:  extreme fatigue   Complete by:  As directed    Call MD for:  hives   Complete by:  As directed    Call MD for:  persistant dizziness or light-headedness   Complete by:  As directed    Call MD for:  persistant nausea and vomiting   Complete by:  As directed    Call MD for:  redness, tenderness, or signs of infection (pain, swelling, redness, odor or green/yellow discharge around incision site)   Complete by:  As directed    Call MD for:  severe uncontrolled pain   Complete by:  As directed    Call MD for:  temperature >100.4   Complete by:  As directed    Diet - low sodium heart healthy   Complete by:  As directed    Discharge instructions   Complete by:  As directed    May shower daily, OTC tylenol prn pain   Increase activity slowly   Complete by:  As directed    Lifting restrictions   Complete by:  As directed    20 lbs x  6 wks   No wound care   Complete by:  As directed      Allergies as of 12/06/2017      Reactions   Eliquis [apixaban] Other (See Comments)   5 hours of nonstop nasal bleeding.  Required OVN stay in [place].  Occurred 04/2017   Acyclovir And Related       Medication List    STOP taking these medications   HYDROcodone-acetaminophen 5-325 MG tablet Commonly known as:  NORCO     TAKE these medications   acetaminophen 500 MG tablet Commonly known as:  TYLENOL Take 500 mg by mouth daily as needed for moderate pain or headache.   ALPRAZolam 0.25 MG tablet Commonly  known as:  XANAX Take 0.25 mg by mouth at bedtime as needed for sleep.   amiodarone 200 MG tablet Commonly known as:  PACERONE Take 1 tablet (200 mg total) by mouth daily.   aspirin 81 MG EC tablet Take 1 tablet (81 mg total) by mouth daily.   atorvastatin 10 MG tablet Commonly known as:  LIPITOR Take 10 mg by mouth daily.   furosemide 40 MG tablet Commonly known as:  LASIX Take 1 tablet (40 mg total) by mouth daily. What changed:  when to take this   gentamicin cream 0.1 % Commonly known as:  GARAMYCIN Apply 1 application topically daily. Apply to stomach catheter when changing.   KLOR-CON M10 10 MEQ tablet Generic drug:  potassium chloride Take 10 mEq by mouth daily.   midodrine 5 MG tablet Commonly known as:  PROAMATINE Take 5 mg by mouth 3 (three) times daily.   multivitamin Tabs tablet Take 1 tablet by mouth daily.   sodium chloride 0.65 % Soln nasal spray Commonly known as:  OCEAN Place 1 spray into both nostrils as needed for congestion.   tamsulosin 0.4 MG Caps capsule Commonly known as:  FLOMAX Take 0.4 mg by mouth every evening.      Follow-up Information    Pabon, [place] F, MD Follow up in 2 week(s).   Specialty:  General Surgery Contact information: [place] [address] [phone]        Teodoro Spray, MD Follow up in 1 week(s).   Specialty:  Cardiology Contact information: [place] [address] [phone]        Kirk Ruths, MD Follow up in 6 day(s).   Specialty:  Internal Medicine Contact information: [address] [facility] [place] [address] [phone]            Caroleen Hamman, MD FACS

## 2017-12-06 NOTE — Care Management Note (Addendum)
Case Management Note  Patient Details  Name: Ruben Soltis Sr. MRN: [id number] Date of Birth: 1926-05-08  Subjective/Objective: Patient to be discharged per MD order. Patient with orders in place for Home health services. Spoke with patient and spouse regarding choice of agency. Patient used [facility] in the past and had a good experience so he would like to use them again. Referral placed with Helene Kelp from [place] who agrees to accept the patient. PT services only. Walker and cane available in the home. [place] oxygen in room but family is unaware of what agency but reports they are good on supplies. Spouse to transport home. Dialysis liaison aware of case. No further RNCM needs. Ines Bloomer RN BSN RNCM [phone]                  Action/Plan:   Expected Discharge Date:  12/06/17               Expected Discharge Plan:  [place]  In-House Referral:     Discharge planning Services  CM Consult  Post Acute Care Choice:  Home Health Choice offered to:  Patient, Spouse  DME Arranged:    DME Agency:     HH Arranged:  PT [place]:  [facility] (formerly [employer])  Status of Service:  Completed, signed off  If discussed at [employer], dates discussed:    Additional Comments:  Latanya Maudlin, RN 12/06/2017, 10:41 AM

## 2017-12-06 NOTE — Progress Notes (Signed)
Discharge instructions reviewed with patient and wife.  Understanding was verbalized and all questions were answered.  Patient discharged home via wheelchair in stable condition escorted by nursing staff.

## 2017-12-09 ENCOUNTER — Telehealth: Payer: Self-pay

## 2017-12-09 NOTE — Telephone Encounter (Signed)
Flagged on EMMI report for having unfilled prescriptions and questions about discharge papers.  Called and spoke with Ruben Hudson, wife, who completed EMMI call.  She mentioned no issues with prescriptions or discharge papers, however they did have an issue arise concerning his dialysis appointment yesterday.  Per Ruben Hudson, he was set to have dialysis at [facility] at 4:30pm, however when they got there they did not have orders so no chair was available.  They had to work on getting the orders and finally got a chair at 6pm, not coming home until after 10pm.  She was concerned over what occurred to delay the orders from getting to [facility] in time after discharge.  I thanked her for her feedback and mentioned I would pass along to see where breakdown was to avoid future occurrences.  No further questions at this time.  I thanked her for her time and made her aware that they would receive one more automated call checking on him in the next few days.

## 2017-12-12 ENCOUNTER — Encounter: Payer: Self-pay | Admitting: Surgery

## 2017-12-18 ENCOUNTER — Other Ambulatory Visit: Payer: Self-pay

## 2017-12-22 ENCOUNTER — Encounter: Payer: Self-pay | Admitting: Surgery

## 2017-12-22 ENCOUNTER — Ambulatory Visit (INDEPENDENT_AMBULATORY_CARE_PROVIDER_SITE_OTHER): Payer: Medicare Other | Admitting: Surgery

## 2017-12-22 VITALS — BP 99/57 | HR 68 | Ht 71.0 in | Wt 132.0 lb

## 2017-12-22 DIAGNOSIS — Z09 Encounter for follow-up examination after completed treatment for conditions other than malignant neoplasm: Secondary | ICD-10-CM

## 2017-12-22 NOTE — Patient Instructions (Addendum)
Patient to return as needed. The patient is aware to call back for any questions or concerns. 

## 2017-12-22 NOTE — Progress Notes (Signed)
S/p ventral hernia repair for incarceration 7/10 Doing very well Currently on HD via right perm cath Taking pO, no fevers  PE debilitated male NAD Abd: soft, nt, incision healing well, no infection or leak. Catheter in place  A/p Doing well May resume PD No surgical issues' RTC prn

## 2017-12-23 DIAGNOSIS — R54 Age-related physical debility: Secondary | ICD-10-CM | POA: Insufficient documentation

## 2017-12-23 DIAGNOSIS — J9611 Chronic respiratory failure with hypoxia: Secondary | ICD-10-CM | POA: Insufficient documentation

## 2018-01-07 ENCOUNTER — Encounter (INDEPENDENT_AMBULATORY_CARE_PROVIDER_SITE_OTHER): Payer: Self-pay

## 2018-01-07 ENCOUNTER — Other Ambulatory Visit (INDEPENDENT_AMBULATORY_CARE_PROVIDER_SITE_OTHER): Payer: Self-pay | Admitting: Vascular Surgery

## 2018-01-12 ENCOUNTER — Encounter
Admission: RE | Disposition: A | Discharge status: Home / Self Care | Payer: Self-pay | Source: Ambulatory Visit | Attending: Vascular Surgery

## 2018-01-12 ENCOUNTER — Ambulatory Visit
Admission: RE | Admit: 2018-01-12 | Discharge: 2018-01-12 | Disposition: A | Discharge status: Home / Self Care | Payer: Medicare Other | Source: Ambulatory Visit | Attending: Vascular Surgery | Admitting: Vascular Surgery

## 2018-01-12 ENCOUNTER — Encounter: Payer: Self-pay | Admitting: Vascular Surgery

## 2018-01-12 DIAGNOSIS — N186 End stage renal disease: Secondary | ICD-10-CM

## 2018-01-12 DIAGNOSIS — I132 Hypertensive heart and chronic kidney disease with heart failure and with stage 5 chronic kidney disease, or end stage renal disease: Secondary | ICD-10-CM | POA: Insufficient documentation

## 2018-01-12 DIAGNOSIS — I509 Heart failure, unspecified: Secondary | ICD-10-CM | POA: Diagnosis not present

## 2018-01-12 DIAGNOSIS — Z87891 Personal history of nicotine dependence: Secondary | ICD-10-CM | POA: Insufficient documentation

## 2018-01-12 DIAGNOSIS — I10 Essential (primary) hypertension: Secondary | ICD-10-CM

## 2018-01-12 DIAGNOSIS — Z8673 Personal history of transient ischemic attack (TIA), and cerebral infarction without residual deficits: Secondary | ICD-10-CM | POA: Diagnosis not present

## 2018-01-12 DIAGNOSIS — T8241XA Breakdown (mechanical) of vascular dialysis catheter, initial encounter: Secondary | ICD-10-CM | POA: Diagnosis not present

## 2018-01-12 DIAGNOSIS — Z992 Dependence on renal dialysis: Secondary | ICD-10-CM | POA: Insufficient documentation

## 2018-01-12 DIAGNOSIS — Y832 Surgical operation with anastomosis, bypass or graft as the cause of abnormal reaction of the patient, or of later complication, without mention of misadventure at the time of the procedure: Secondary | ICD-10-CM | POA: Diagnosis not present

## 2018-01-12 DIAGNOSIS — I252 Old myocardial infarction: Secondary | ICD-10-CM | POA: Insufficient documentation

## 2018-01-12 DIAGNOSIS — I2510 Atherosclerotic heart disease of native coronary artery without angina pectoris: Secondary | ICD-10-CM | POA: Diagnosis not present

## 2018-01-12 HISTORY — PX: DIALYSIS/PERMA CATHETER REMOVAL: CATH118289

## 2018-01-12 SURGERY — DIALYSIS/PERMA CATHETER REMOVAL
Anesthesia: LOCAL

## 2018-01-12 MED ORDER — LIDOCAINE-EPINEPHRINE (PF) 1 %-1:200000 IJ SOLN
INTRAMUSCULAR | Status: DC | PRN
  Administered 2018-01-12: 10 mL via INTRADERMAL

## 2018-01-12 SURGICAL SUPPLY — 2 items
FORCEPS HALSTEAD CVD 5IN STRL (INSTRUMENTS) ×2 IMPLANT
TRAY LACERAT/PLASTIC (MISCELLANEOUS) ×2 IMPLANT

## 2018-01-12 NOTE — Op Note (Signed)
Operative Note     Preoperative diagnosis:   1. ESRD with functional permanent access  Postoperative diagnosis:  1. ESRD with functional permanent access  Procedure:  Removal of right jugular Permcath  Surgeon:  Leotis Pain, MD  Anesthesia:  Local  EBL:  Minimal  Indication for the Procedure:  The patient has a functional permanent dialysis access and no longer needs their permcath.  This can be removed.  Risks and benefits are discussed and informed consent is obtained.  Description of the Procedure:  The patient's right neck, chest and existing catheter were sterilely prepped and draped. The area around the catheter was anesthetized copiously with 1% lidocaine. The catheter was dissected out with curved hemostats until the cuff was freed from the surrounding fibrous sheath. The fiber sheath was transected, and the catheter was then removed in its entirety using gentle traction. Pressure was held and sterile dressings were placed. The patient tolerated the procedure well and was taken to the recovery room in stable condition.     Leotis Pain  01/12/2018, 11:42 AM This note was created with Dragon Medical transcription system. Any errors in dictation are purely unintentional.

## 2018-01-12 NOTE — Discharge Instructions (Signed)
Incision Care, Adult °An incision is a cut that a doctor makes in your skin for surgery (for a procedure). Most times, these cuts are closed after surgery. Your cut from surgery may be closed with stitches (sutures), staples, skin glue, or skin tape (adhesive strips). You may need to return to your doctor to have stitches or staples taken out. This may happen many days or many weeks after your surgery. The cut needs to be well cared for so it does not get infected. °How to care for your cut °Cut care °· Follow instructions from your doctor about how to take care of your cut. Make sure you: °? Wash your hands with soap and water before you change your bandage (dressing). If you cannot use soap and water, use hand sanitizer. °? Change your bandage as told by your doctor. °? Leave stitches, skin glue, or skin tape in place. They may need to stay in place for 2 weeks or longer. If tape strips get loose and curl up, you may trim the loose edges. Do not remove tape strips completely unless your doctor says it is okay. °· Check your cut area every day for signs of infection. Check for: °? More redness, swelling, or pain. °? More fluid or blood. °? Warmth. °? Pus or a bad smell. °· Ask your doctor how to clean the cut. This may include: °? Using mild soap and water. °? Using a clean towel to pat the cut dry after you clean it. °? Putting a cream or ointment on the cut. Do this only as told by your doctor. °? Covering the cut with a clean bandage. °· Ask your doctor when you can leave the cut uncovered. °· Do not take baths, swim, or use a hot tub until your doctor says it is okay. Ask your doctor if you can take showers. You may only be allowed to take sponge baths for bathing. °Medicines °· If you were prescribed an antibiotic medicine, cream, or ointment, take the antibiotic or put it on the cut as told by your doctor. Do not stop taking or putting on the antibiotic even if your condition gets better. °· Take  over-the-counter and prescription medicines only as told by your doctor. °General instructions °· Limit movement around your cut. This helps healing. °? Avoid straining, lifting, or exercise for the first month, or for as long as told by your doctor. °? Follow instructions from your doctor about going back to your normal activities. °? Ask your doctor what activities are safe. °· Protect your cut from the sun when you are outside for the first 6 months, or for as long as told by your doctor. Put on sunscreen around the scar or cover up the scar. °· Keep all follow-up visits as told by your doctor. This is important. °Contact a doctor if: °· Your have more redness, swelling, or pain around the cut. °· You have more fluid or blood coming from the cut. °· Your cut feels warm to the touch. °· You have pus or a bad smell coming from the cut. °· You have a fever or shaking chills. °· You feel sick to your stomach (nauseous) or you throw up (vomit). °· You are dizzy. °· Your stitches or staples come undone. °Get help right away if: °· You have a red streak coming from your cut. °· Your cut bleeds through the bandage and the bleeding does not stop with gentle pressure. °· The edges of your cut open   up and separate. °· You have very bad (severe) pain. °· You have a rash. °· You are confused. °· You pass out (faint). °· You have trouble breathing and you have a fast heartbeat. °This information is not intended to replace advice given to you by your health care provider. Make sure you discuss any questions you have with your health care provider. °Document Released: 08/05/2011 Document Revised: 01/19/2016 Document Reviewed: 01/19/2016 °Elsevier Interactive Patient Education © 2017 Elsevier Inc. ° °

## 2018-01-12 NOTE — H&P (Signed)
[facility] Admission History & Physical  MRN : [id number]  Ruben Lex Sr. is a 84 y.o. ([birth year]) male who presents with chief complaint of No chief complaint on file. Marland Kitchen  History of Present Illness: I am asked to evaluate the patient by the dialysis center. The patient was sent here because they have a nonfunctioning tunneled catheter and a functioning PD catheter.  The patient reports they're not been any problems with any of their dialysis runs. They are reporting good flows with good parameters at dialysis.  Patient denies pain or tenderness overlying the access.  There is no pain with dialysis.  The patient denies hand pain or finger pain consistent with steal syndrome.  No fevers or chills while on dialysis.   No current facility-administered medications for this encounter.     Past Medical History:  Diagnosis Date  . Acute pulmonary edema ([place]) 02/26/2013  . Anxiety   . Arthritis    little  . Benign prostatic hyperplasia with urinary obstruction 02/24/2012  . BP (high blood pressure) 10/31/2015  . Cataract extraction status of left eye   . Cataract extraction status of right eye   . Cerebrovascular accident (CVA) ([place]) 04/24/2012   affected right eye, limited peripheral vision  . Chronic kidney disease 07/25/2017   stage 3  . Congestive heart failure ([place]) 10/31/2015  . Dyspnea    uses continuous o2  . Dysrhythmia    AF  . HLD (hyperlipidemia) 10/31/2015  . Hydrocele 02/25/2012  . Myocardial infarction ([place])    per patient, small MI  . Renal insufficiency   . Unstable angina pectoris ([place]) 02/26/2013  . Urge incontinence of urine 05/30/2014  . Urinary urgency 10/31/2015    Past Surgical History:  Procedure Laterality Date  . ANKLE FRACTURE SURGERY Right 2006   metal in ankle, screws and plate  . CAPD INSERTION N/A 08/01/2017   Procedure: LAPAROSCOPIC INSERTION CONTINUOUS AMBULATORY PERITONEAL DIALYSIS  (CAPD) CATHETER;  Surgeon: Katha Cabal, MD;  Location: [facility] ORS;  Service: Vascular;  Laterality: N/A;  . CIRCUMCISION N/A 11/13/2015   Procedure: CIRCUMCISION ADULT;  Surgeon: Hollice Espy, MD;  Location: [facility] ORS;  Service: Urology;  Laterality: N/A;  . DIALYSIS/PERMA CATHETER INSERTION N/A 03/17/2017   Procedure: DIALYSIS/PERMA CATHETER INSERTION;  Surgeon: Algernon Huxley, MD;  Location: [place] [facility];  Service: Cardiovascular;  Laterality: N/A;  . DIALYSIS/PERMA CATHETER INSERTION N/A 12/03/2017   Procedure: DIALYSIS/PERMA CATHETER INSERTION;  Surgeon: Katha Cabal, MD;  Location: [place] [facility];  Service: Cardiovascular;  Laterality: N/A;  . DIALYSIS/PERMA CATHETER REMOVAL N/A 09/18/2017   Procedure: DIALYSIS/PERMA CATHETER REMOVAL;  Surgeon: Algernon Huxley, MD;  Location: [place] [facility];  Service: Cardiovascular;  Laterality: N/A;  . EYE SURGERY Bilateral    cataract extractions  . HERNIA REPAIR    . HYDROCELE EXCISION Bilateral 11/13/2015   Procedure: HYDROCELECTOMY ADULT;  Surgeon: Hollice Espy, MD;  Location: [facility] ORS;  Service: Urology;  Laterality: Bilateral;  . TONSILLECTOMY  1950's  . TONSILLECTOMY    . [place]   with mesh  . VARICOCELECTOMY  1948  . VASECTOMY    . VENTRAL HERNIA REPAIR N/A 12/01/2017   Procedure: HERNIA REPAIR VENTRAL ADULT;  Surgeon: Jules Husbands, MD;  Location: [facility] ORS;  Service: General;  Laterality: N/A;    Social History Social History   Tobacco Use  . Smoking status: Former Smoker    Packs/day: 1.00  Years: 20.00    Pack years: 20.00    Types: Cigarettes    Last attempt to quit: 11/03/1954    Years since quitting: 63.2  . Smokeless tobacco: Never Used  . Tobacco comment: started smoking at 84 years old  Substance Use Topics  . Alcohol use: No    Alcohol/week: 8.0 standard drinks    Types: 7 Shots of liquor, 1 Standard drinks or equivalent per week    Frequency: Never    Comment: 1 shot of gin a day.no gin x 1 year since dialysis  .  Drug use: No    Family History Family History  Problem Relation Age of Onset  . Kidney cancer Neg Hx   . Prostate cancer Neg Hx     No family history of bleeding or clotting disorders, autoimmune disease or porphyria  Allergies  Allergen Reactions  . Eliquis [Apixaban] Other (See Comments)    5 hours of nonstop nasal bleeding.  Required OVN stay in [place].  Occurred 04/2017  . Acyclovir And Related Other (See Comments)    Unknown     REVIEW OF SYSTEMS (Negative unless checked)  Constitutional: [] Weight loss  [] Fever  [] Chills Cardiac: [] Chest pain   [] Chest pressure   [] Palpitations   [] Shortness of breath when laying flat   [] Shortness of breath at rest   [x] Shortness of breath with exertion. Vascular:  [] Pain in legs with walking   [] Pain in legs at rest   [] Pain in legs when laying flat   [] Claudication   [] Pain in feet when walking  [] Pain in feet at rest  [] Pain in feet when laying flat   [] History of DVT   [] Phlebitis   [] Swelling in legs   [] Varicose veins   [] Non-healing ulcers Pulmonary:   [] Uses home oxygen   [] Productive cough   [] Hemoptysis   [] Wheeze  [] COPD   [] Asthma Neurologic:  [] Dizziness  [] Blackouts   [] Seizures   [] History of stroke   [] History of TIA  [] Aphasia   [] Temporary blindness   [] Dysphagia   [] Weakness or numbness in arms   [] Weakness or numbness in legs Musculoskeletal:  [x] Arthritis   [] Joint swelling   [] Joint pain   [] Low back pain Hematologic:  [] Easy bruising  [] Easy bleeding   [] Hypercoagulable state   [x] Anemic  [] Hepatitis Gastrointestinal:  [] Blood in stool   [] Vomiting blood  [] Gastroesophageal reflux/heartburn   [] Difficulty swallowing. Genitourinary:  [x] Chronic kidney disease   [] Difficult urination  [x] Frequent urination  [] Burning with urination   [] Blood in urine Skin:  [] Rashes   [] Ulcers   [] Wounds Psychological:  [] History of anxiety   []  History of major depression.  Physical Examination  There were no vitals filed for this  visit. There is no height or weight on file to calculate BMI. Gen: WD/WN, NAD Head: Halfway/AT, No temporalis wasting.  Ear/Nose/Throat: Hearing grossly intact, nares w/o erythema or drainage, oropharynx w/o Erythema/Exudate,  Eyes: Conjunctiva clear, sclera non-icteric Neck: Trachea midline.  No JVD.  Pulmonary:  Good air movement, respirations not labored, no use of accessory muscles.  Cardiac: RRR, normal S1, S2. Vascular:  Vessel Right Left  Radial Palpable Palpable              Gastrointestinal: soft, non-tender/non-distended. PD catheter in place Musculoskeletal: M/S 5/5 throughout.  Extremities without ischemic changes.  No deformity or atrophy.  Neurologic: Sensation grossly intact in extremities.  Symmetrical.  Speech is fluent. Motor exam as listed above. Psychiatric: Judgment intact, Mood & affect appropriate  for pt's clinical situation. Dermatologic: No rashes or ulcers noted.  No cellulitis or open wounds.    CBC Lab Results  Component Value Date   WBC 5.8 12/05/2017   HGB 11.1 (L) 12/05/2017   HCT 32.9 (L) 12/05/2017   MCV 95.9 12/05/2017   PLT 158 12/05/2017    BMET    Component Value Date/Time   NA 139 12/05/2017 0522   NA 140 05/30/2012 0540   K 3.7 12/05/2017 0522   K 3.7 05/30/2012 0540   CL 104 12/05/2017 0522   CL 105 05/30/2012 0540   CO2 25 12/05/2017 0522   CO2 28 05/30/2012 0540   GLUCOSE 107 (H) 12/05/2017 0522   GLUCOSE 94 05/30/2012 0540   BUN 53 (H) 12/05/2017 0522   BUN 30 (H) 05/30/2012 0540   CREATININE 2.61 (H) 12/05/2017 0522   CREATININE 1.63 (H) 05/30/2012 0540   CALCIUM 8.3 (L) 12/05/2017 0522   CALCIUM 8.6 05/30/2012 0540   GFRNONAA 20 (L) 12/05/2017 0522   GFRNONAA 38 (L) 05/30/2012 0540   GFRAA 23 (L) 12/05/2017 0522   GFRAA 44 (L) 05/30/2012 0540   CrCl cannot be calculated (Patient's most recent lab result is older than the maximum 21 days allowed.).  COAG Lab Results  Component Value Date   INR 1.06 12/01/2017   INR  1.09 07/25/2017   INR 1.47 03/20/2017    Radiology No results found.  Assessment/Plan 1.  Complication dialysis device:  Patient's Tunneled catheter is not being used. The patient has a PD catheter that is functioning well. Therefore, the patient will undergo removal of the tunneled catheter under local anesthesia.  The risks and benefits were described to the patient.  All questions were answered.  The patient agrees to proceed with angiography and intervention. Potassium will be drawn to ensure that it is an appropriate level prior to performing intervention. 2.  End-stage renal disease requiring dialysis:  Patient will continue dialysis therapy without further interruption 3.  Hypertension:  Patient will continue medical management; nephrology is following no changes in oral medications. 4.  Coronary artery disease:  EKG will be monitored. Nitrates will be used if needed. The patient's oral cardiac medications will be continued.    Leotis Pain, MD  01/12/2018 10:15 AM

## 2018-01-21 DIAGNOSIS — Z9989 Dependence on other enabling machines and devices: Secondary | ICD-10-CM | POA: Insufficient documentation

## 2018-02-26 ENCOUNTER — Other Ambulatory Visit: Payer: Self-pay | Admitting: Nephrology

## 2018-02-26 DIAGNOSIS — N183 Chronic kidney disease, stage 3 unspecified: Secondary | ICD-10-CM

## 2018-03-04 ENCOUNTER — Other Ambulatory Visit: Payer: Self-pay | Admitting: Nephrology

## 2018-03-04 ENCOUNTER — Ambulatory Visit: Payer: Medicare Other

## 2018-03-09 ENCOUNTER — Ambulatory Visit
Admission: RE | Admit: 2018-03-09 | Discharge: 2018-03-09 | Disposition: A | Discharge status: Home / Self Care | Payer: Medicare Other | Source: Ambulatory Visit | Attending: Nephrology | Admitting: Nephrology

## 2018-03-09 DIAGNOSIS — J90 Pleural effusion, not elsewhere classified: Secondary | ICD-10-CM | POA: Diagnosis not present

## 2018-03-09 DIAGNOSIS — N183 Chronic kidney disease, stage 3 unspecified: Secondary | ICD-10-CM

## 2018-03-09 DIAGNOSIS — N400 Enlarged prostate without lower urinary tract symptoms: Secondary | ICD-10-CM | POA: Diagnosis not present

## 2018-03-09 DIAGNOSIS — N185 Chronic kidney disease, stage 5: Secondary | ICD-10-CM | POA: Insufficient documentation

## 2018-05-26 DIAGNOSIS — R413 Other amnesia: Secondary | ICD-10-CM | POA: Insufficient documentation

## 2018-07-20 ENCOUNTER — Ambulatory Visit
Admission: RE | Admit: 2018-07-20 | Discharge: 2018-07-20 | Disposition: A | Discharge status: Home / Self Care | Payer: Medicare Other | Source: Ambulatory Visit | Attending: Nephrology | Admitting: Nephrology

## 2018-07-20 ENCOUNTER — Other Ambulatory Visit: Payer: Self-pay | Admitting: Nephrology

## 2018-07-20 ENCOUNTER — Ambulatory Visit
Admission: RE | Admit: 2018-07-20 | Discharge: 2018-07-20 | Disposition: A | Discharge status: Home / Self Care | Payer: Medicare Other | Attending: Nephrology | Admitting: Nephrology

## 2018-07-20 DIAGNOSIS — R0602 Shortness of breath: Secondary | ICD-10-CM | POA: Diagnosis present

## 2019-04-14 ENCOUNTER — Inpatient Hospital Stay
Admission: EM | Admit: 2019-04-14 | Discharge: 2019-04-19 | DRG: 480 | Disposition: A | Discharge status: Home Health | Payer: Medicare Other | Attending: Internal Medicine | Admitting: Internal Medicine

## 2019-04-14 ENCOUNTER — Encounter
Admission: EM | Disposition: A | Discharge status: Home Health | Payer: Self-pay | Source: Home / Self Care | Attending: Family Medicine

## 2019-04-14 ENCOUNTER — Other Ambulatory Visit: Payer: Self-pay

## 2019-04-14 ENCOUNTER — Inpatient Hospital Stay: Payer: Medicare Other

## 2019-04-14 ENCOUNTER — Emergency Department: Payer: Medicare Other

## 2019-04-14 ENCOUNTER — Inpatient Hospital Stay: Payer: Medicare Other | Admitting: Anesthesiology

## 2019-04-14 ENCOUNTER — Encounter: Payer: Self-pay | Admitting: Emergency Medicine

## 2019-04-14 DIAGNOSIS — E440 Moderate protein-calorie malnutrition: Secondary | ICD-10-CM | POA: Diagnosis present

## 2019-04-14 DIAGNOSIS — I482 Chronic atrial fibrillation, unspecified: Secondary | ICD-10-CM | POA: Diagnosis present

## 2019-04-14 DIAGNOSIS — Z87891 Personal history of nicotine dependence: Secondary | ICD-10-CM

## 2019-04-14 DIAGNOSIS — Z79890 Hormone replacement therapy (postmenopausal): Secondary | ICD-10-CM

## 2019-04-14 DIAGNOSIS — S72002A Fracture of unspecified part of neck of left femur, initial encounter for closed fracture: Secondary | ICD-10-CM | POA: Diagnosis present

## 2019-04-14 DIAGNOSIS — F0390 Unspecified dementia without behavioral disturbance: Secondary | ICD-10-CM | POA: Diagnosis present

## 2019-04-14 DIAGNOSIS — Z681 Body mass index (BMI) 19 or less, adult: Secondary | ICD-10-CM

## 2019-04-14 DIAGNOSIS — I480 Paroxysmal atrial fibrillation: Secondary | ICD-10-CM | POA: Diagnosis present

## 2019-04-14 DIAGNOSIS — E785 Hyperlipidemia, unspecified: Secondary | ICD-10-CM | POA: Diagnosis present

## 2019-04-14 DIAGNOSIS — Z20828 Contact with and (suspected) exposure to other viral communicable diseases: Secondary | ICD-10-CM | POA: Diagnosis present

## 2019-04-14 DIAGNOSIS — I447 Left bundle-branch block, unspecified: Secondary | ICD-10-CM | POA: Diagnosis present

## 2019-04-14 DIAGNOSIS — D631 Anemia in chronic kidney disease: Secondary | ICD-10-CM | POA: Diagnosis present

## 2019-04-14 DIAGNOSIS — W010XXA Fall on same level from slipping, tripping and stumbling without subsequent striking against object, initial encounter: Secondary | ICD-10-CM | POA: Diagnosis present

## 2019-04-14 DIAGNOSIS — N529 Male erectile dysfunction, unspecified: Secondary | ICD-10-CM | POA: Diagnosis present

## 2019-04-14 DIAGNOSIS — D72829 Elevated white blood cell count, unspecified: Secondary | ICD-10-CM | POA: Diagnosis present

## 2019-04-14 DIAGNOSIS — R05 Cough: Secondary | ICD-10-CM | POA: Diagnosis present

## 2019-04-14 DIAGNOSIS — F419 Anxiety disorder, unspecified: Secondary | ICD-10-CM | POA: Diagnosis present

## 2019-04-14 DIAGNOSIS — J9611 Chronic respiratory failure with hypoxia: Secondary | ICD-10-CM | POA: Diagnosis present

## 2019-04-14 DIAGNOSIS — M1990 Unspecified osteoarthritis, unspecified site: Secondary | ICD-10-CM | POA: Diagnosis present

## 2019-04-14 DIAGNOSIS — Z79899 Other long term (current) drug therapy: Secondary | ICD-10-CM

## 2019-04-14 DIAGNOSIS — D62 Acute posthemorrhagic anemia: Secondary | ICD-10-CM | POA: Diagnosis not present

## 2019-04-14 DIAGNOSIS — Z9981 Dependence on supplemental oxygen: Secondary | ICD-10-CM

## 2019-04-14 DIAGNOSIS — Z992 Dependence on renal dialysis: Secondary | ICD-10-CM

## 2019-04-14 DIAGNOSIS — N400 Enlarged prostate without lower urinary tract symptoms: Secondary | ICD-10-CM | POA: Diagnosis present

## 2019-04-14 DIAGNOSIS — Z419 Encounter for procedure for purposes other than remedying health state, unspecified: Secondary | ICD-10-CM

## 2019-04-14 DIAGNOSIS — I252 Old myocardial infarction: Secondary | ICD-10-CM

## 2019-04-14 DIAGNOSIS — F05 Delirium due to known physiological condition: Secondary | ICD-10-CM | POA: Diagnosis present

## 2019-04-14 DIAGNOSIS — R55 Syncope and collapse: Secondary | ICD-10-CM | POA: Diagnosis present

## 2019-04-14 DIAGNOSIS — I509 Heart failure, unspecified: Secondary | ICD-10-CM

## 2019-04-14 DIAGNOSIS — Z515 Encounter for palliative care: Secondary | ICD-10-CM | POA: Diagnosis present

## 2019-04-14 DIAGNOSIS — I959 Hypotension, unspecified: Secondary | ICD-10-CM | POA: Diagnosis present

## 2019-04-14 DIAGNOSIS — Z7982 Long term (current) use of aspirin: Secondary | ICD-10-CM

## 2019-04-14 DIAGNOSIS — N186 End stage renal disease: Secondary | ICD-10-CM | POA: Diagnosis present

## 2019-04-14 DIAGNOSIS — I5022 Chronic systolic (congestive) heart failure: Secondary | ICD-10-CM | POA: Diagnosis present

## 2019-04-14 DIAGNOSIS — Z888 Allergy status to other drugs, medicaments and biological substances status: Secondary | ICD-10-CM

## 2019-04-14 DIAGNOSIS — I420 Dilated cardiomyopathy: Secondary | ICD-10-CM | POA: Diagnosis present

## 2019-04-14 DIAGNOSIS — E039 Hypothyroidism, unspecified: Secondary | ICD-10-CM | POA: Diagnosis present

## 2019-04-14 DIAGNOSIS — Y92009 Unspecified place in unspecified non-institutional (private) residence as the place of occurrence of the external cause: Secondary | ICD-10-CM | POA: Diagnosis not present

## 2019-04-14 DIAGNOSIS — R058 Other specified cough: Secondary | ICD-10-CM

## 2019-04-14 DIAGNOSIS — Z8673 Personal history of transient ischemic attack (TIA), and cerebral infarction without residual deficits: Secondary | ICD-10-CM

## 2019-04-14 HISTORY — PX: HIP PINNING,CANNULATED: SHX1758

## 2019-04-14 LAB — BASIC METABOLIC PANEL
Anion gap: 11 (ref 5–15)
BUN: 41 mg/dL — ABNORMAL HIGH (ref 8–23)
CO2: 28 mmol/L (ref 22–32)
Calcium: 8.6 mg/dL — ABNORMAL LOW (ref 8.9–10.3)
Chloride: 103 mmol/L (ref 98–111)
Creatinine, Ser: 3.53 mg/dL — ABNORMAL HIGH (ref 0.61–1.24)
GFR calc Af Amer: 17 mL/min — ABNORMAL LOW (ref >60)
GFR calc non Af Amer: 14 mL/min — ABNORMAL LOW (ref >60)
Glucose, Bld: 136 mg/dL — ABNORMAL HIGH (ref 70–99)
Potassium: 4.3 mmol/L (ref 3.5–5.1)
Sodium: 142 mmol/L (ref 135–145)

## 2019-04-14 LAB — CBC WITH DIFFERENTIAL/PLATELET
Abs Immature Granulocytes: 0.09 10*3/uL — ABNORMAL HIGH (ref 0.00–0.07)
Basophils Absolute: 0 10*3/uL (ref 0.0–0.1)
Basophils Relative: 0 %
Eosinophils Absolute: 0 10*3/uL (ref 0.0–0.5)
Eosinophils Relative: 0 %
HCT: 42.5 % (ref 39.0–52.0)
Hemoglobin: 13.8 g/dL (ref 13.0–17.0)
Immature Granulocytes: 1 %
Lymphocytes Relative: 2 %
Lymphs Abs: 0.3 10*3/uL — ABNORMAL LOW (ref 0.7–4.0)
MCH: 32.1 pg (ref 26.0–34.0)
MCHC: 32.5 g/dL (ref 30.0–36.0)
MCV: 98.8 fL (ref 80.0–100.0)
Monocytes Absolute: 0.9 10*3/uL (ref 0.1–1.0)
Monocytes Relative: 6 %
Neutro Abs: 14 10*3/uL — ABNORMAL HIGH (ref 1.7–7.7)
Neutrophils Relative %: 91 %
Platelets: 186 10*3/uL (ref 150–400)
RBC: 4.3 MIL/uL (ref 4.22–5.81)
RDW: 14.7 % (ref 11.5–15.5)
WBC: 15.3 10*3/uL — ABNORMAL HIGH (ref 4.0–10.5)
nRBC: 0 % (ref 0.0–0.2)

## 2019-04-14 LAB — SARS CORONAVIRUS 2 (TAT 6-24 HRS): SARS Coronavirus 2: NEGATIVE

## 2019-04-14 LAB — SAMPLE TO BLOOD BANK

## 2019-04-14 SURGERY — FIXATION, FEMUR, NECK, PERCUTANEOUS, USING SCREW
Anesthesia: Spinal | Site: Hip | Laterality: Left

## 2019-04-14 MED ORDER — HEPARIN SODIUM (PORCINE) 5000 UNIT/ML IJ SOLN
5000.0000 [IU] | Freq: Three times a day (TID) | INTRAMUSCULAR | Status: DC
  Administered 2019-04-15 – 2019-04-19 (×12): 5000 [IU] via SUBCUTANEOUS
  Filled 2019-04-14 (×13): qty 1

## 2019-04-14 MED ORDER — FENTANYL CITRATE (PF) 100 MCG/2ML IJ SOLN
INTRAMUSCULAR | Status: AC
  Filled 2019-04-14: qty 2

## 2019-04-14 MED ORDER — EPHEDRINE SULFATE 50 MG/ML IJ SOLN
INTRAMUSCULAR | Status: DC | PRN
  Administered 2019-04-14: 5 mg via INTRAVENOUS

## 2019-04-14 MED ORDER — PROPOFOL 500 MG/50ML IV EMUL
INTRAVENOUS | Status: DC | PRN
  Administered 2019-04-14: 50 ug/kg/min via INTRAVENOUS

## 2019-04-14 MED ORDER — BISACODYL 10 MG RE SUPP: 10.0000 mg | Freq: Every day | RECTAL | Status: DC | PRN

## 2019-04-14 MED ORDER — MAGNESIUM CITRATE PO SOLN
1.0000 | Freq: Once | ORAL | Status: DC | PRN
  Filled 2019-04-14: qty 296

## 2019-04-14 MED ORDER — MENTHOL 3 MG MT LOZG
1.0000 | LOZENGE | OROMUCOSAL | Status: DC | PRN
  Filled 2019-04-14: qty 9

## 2019-04-14 MED ORDER — LEVOTHYROXINE SODIUM 50 MCG PO TABS
50.0000 ug | ORAL_TABLET | Freq: Every day | ORAL | Status: DC
  Administered 2019-04-15 – 2019-04-19 (×5): 50 ug via ORAL
  Filled 2019-04-14 (×5): qty 1

## 2019-04-14 MED ORDER — CEFAZOLIN SODIUM-DEXTROSE 1-4 GM/50ML-% IV SOLN
INTRAVENOUS | Status: AC
  Filled 2019-04-14: qty 50

## 2019-04-14 MED ORDER — AMIODARONE HCL 100 MG PO TABS
200.0000 mg | ORAL_TABLET | Freq: Every day | ORAL | Status: DC
  Administered 2019-04-15 – 2019-04-19 (×5): 200 mg via ORAL
  Filled 2019-04-14 (×6): qty 2

## 2019-04-14 MED ORDER — METOCLOPRAMIDE HCL 5 MG/ML IJ SOLN: 5.0000 mg | Freq: Three times a day (TID) | INTRAMUSCULAR | Status: DC | PRN

## 2019-04-14 MED ORDER — ONDANSETRON HCL 4 MG PO TABS: 4.0000 mg | ORAL_TABLET | Freq: Four times a day (QID) | ORAL | Status: DC | PRN

## 2019-04-14 MED ORDER — ONDANSETRON HCL 4 MG/2ML IJ SOLN: 4.0000 mg | Freq: Four times a day (QID) | INTRAMUSCULAR | Status: DC | PRN

## 2019-04-14 MED ORDER — PROPOFOL 10 MG/ML IV BOLUS
INTRAVENOUS | Status: DC | PRN
  Administered 2019-04-14 (×2): 10 mg via INTRAVENOUS
  Administered 2019-04-14: 30 mg via INTRAVENOUS

## 2019-04-14 MED ORDER — ENOXAPARIN SODIUM 40 MG/0.4ML ~~LOC~~ SOLN: 40.0000 mg | SUBCUTANEOUS | Status: DC

## 2019-04-14 MED ORDER — MIDODRINE HCL 5 MG PO TABS
10.0000 mg | ORAL_TABLET | Freq: Three times a day (TID) | ORAL | Status: DC
  Administered 2019-04-14 – 2019-04-19 (×14): 10 mg via ORAL
  Filled 2019-04-14 (×17): qty 2

## 2019-04-14 MED ORDER — METOCLOPRAMIDE HCL 10 MG PO TABS: 5.0000 mg | ORAL_TABLET | Freq: Three times a day (TID) | ORAL | Status: DC | PRN

## 2019-04-14 MED ORDER — RENA-VITE PO TABS
1.0000 | ORAL_TABLET | Freq: Every day | ORAL | Status: DC
  Administered 2019-04-15 – 2019-04-19 (×5): 1 via ORAL
  Filled 2019-04-14 (×6): qty 1

## 2019-04-14 MED ORDER — CEFAZOLIN SODIUM-DEXTROSE 2-4 GM/100ML-% IV SOLN
2.0000 g | Freq: Four times a day (QID) | INTRAVENOUS | Status: AC
  Administered 2019-04-14 – 2019-04-15 (×2): 2 g via INTRAVENOUS
  Filled 2019-04-14 (×3): qty 100

## 2019-04-14 MED ORDER — ALUM & MAG HYDROXIDE-SIMETH 200-200-20 MG/5ML PO SUSP: 30.0000 mL | ORAL | Status: DC | PRN

## 2019-04-14 MED ORDER — BUPIVACAINE HCL (PF) 0.5 % IJ SOLN
INTRAMUSCULAR | Status: DC | PRN
  Administered 2019-04-14: 2.5 mL

## 2019-04-14 MED ORDER — FENTANYL CITRATE (PF) 100 MCG/2ML IJ SOLN: 25.0000 ug | INTRAMUSCULAR | Status: DC | PRN

## 2019-04-14 MED ORDER — CHLORHEXIDINE GLUCONATE CLOTH 2 % EX PADS
6.0000 | MEDICATED_PAD | Freq: Every day | CUTANEOUS | Status: DC
  Administered 2019-04-15 – 2019-04-18 (×4): 6 via TOPICAL

## 2019-04-14 MED ORDER — SODIUM CHLORIDE 0.9 % IV SOLN
INTRAVENOUS | Status: DC | PRN
  Administered 2019-04-14: 13:00:00 via INTRAVENOUS

## 2019-04-14 MED ORDER — GENTAMICIN SULFATE 0.1 % EX CREA
1.0000 "application " | TOPICAL_CREAM | Freq: Every day | CUTANEOUS | Status: DC
  Administered 2019-04-15 – 2019-04-19 (×3): 1 via TOPICAL
  Filled 2019-04-14: qty 15

## 2019-04-14 MED ORDER — MORPHINE SULFATE (PF) 2 MG/ML IV SOLN
0.5000 mg | INTRAVENOUS | Status: DC | PRN
  Administered 2019-04-14 (×2): 0.5 mg via INTRAVENOUS
  Filled 2019-04-14 (×2): qty 1

## 2019-04-14 MED ORDER — NEOMYCIN-POLYMYXIN B GU 40-200000 IR SOLN
Status: AC
  Filled 2019-04-14: qty 2

## 2019-04-14 MED ORDER — PHENOL 1.4 % MT LIQD
1.0000 | OROMUCOSAL | Status: DC | PRN
  Filled 2019-04-14: qty 177

## 2019-04-14 MED ORDER — DELFLEX-LC/1.5% DEXTROSE 344 MOSM/L IP SOLN
INTRAPERITONEAL | Status: DC
  Filled 2019-04-14: qty 3000

## 2019-04-14 MED ORDER — SODIUM CHLORIDE 0.9 % IV SOLN
INTRAVENOUS | Status: DC
  Administered 2019-04-14: 17:00:00 via INTRAVENOUS

## 2019-04-14 MED ORDER — HEPARIN SODIUM (PORCINE) 1000 UNIT/ML IJ SOLN
500.0000 [IU] | INTRAMUSCULAR | Status: DC | PRN
  Filled 2019-04-14 (×2): qty 0.5

## 2019-04-14 MED ORDER — ONDANSETRON HCL 4 MG/2ML IJ SOLN: 4.0000 mg | Freq: Once | INTRAMUSCULAR | Status: DC | PRN

## 2019-04-14 MED ORDER — NEOMYCIN-POLYMYXIN B GU 40-200000 IR SOLN
Status: DC | PRN
  Administered 2019-04-14: 2 mL

## 2019-04-14 MED ORDER — MAGNESIUM HYDROXIDE 400 MG/5ML PO SUSP: 30.0000 mL | Freq: Every day | ORAL | Status: DC | PRN

## 2019-04-14 MED ORDER — CEFAZOLIN SODIUM-DEXTROSE 1-4 GM/50ML-% IV SOLN
1.0000 g | Freq: Once | INTRAVENOUS | Status: AC
  Administered 2019-04-14: 1 g via INTRAVENOUS
  Filled 2019-04-14: qty 50

## 2019-04-14 MED ORDER — DOCUSATE SODIUM 100 MG PO CAPS
100.0000 mg | ORAL_CAPSULE | Freq: Two times a day (BID) | ORAL | Status: DC
  Administered 2019-04-15 – 2019-04-19 (×9): 100 mg via ORAL
  Filled 2019-04-14 (×9): qty 1

## 2019-04-14 MED ORDER — TAMSULOSIN HCL 0.4 MG PO CAPS
0.4000 mg | ORAL_CAPSULE | Freq: Every evening | ORAL | Status: DC
  Administered 2019-04-14 – 2019-04-18 (×5): 0.4 mg via ORAL
  Filled 2019-04-14 (×5): qty 1

## 2019-04-14 SURGICAL SUPPLY — 34 items
CANISTER SUCT 1200ML W/VALVE (MISCELLANEOUS) ×3 IMPLANT
CHLORAPREP W/TINT 26 (MISCELLANEOUS) ×3 IMPLANT
COVER WAND RF STERILE (DRAPES) IMPLANT
DRSG OPSITE POSTOP 4X8 (GAUZE/BANDAGES/DRESSINGS) ×3 IMPLANT
DRSG TEGADERM 6X8 (GAUZE/BANDAGES/DRESSINGS) IMPLANT
ELECT REM PT RETURN 9FT ADLT (ELECTROSURGICAL) ×3
ELECTRODE REM PT RTRN 9FT ADLT (ELECTROSURGICAL) ×1 IMPLANT
GAUZE SPONGE 4X4 12PLY STRL (GAUZE/BANDAGES/DRESSINGS) IMPLANT
GAUZE XEROFORM 1X8 LF (GAUZE/BANDAGES/DRESSINGS) ×3 IMPLANT
GLOVE BIOGEL PI IND STRL 9 (GLOVE) ×1 IMPLANT
GLOVE BIOGEL PI INDICATOR 9 (GLOVE) ×2
GLOVE SURG SYN 9.0  PF PI (GLOVE) ×2
GLOVE SURG SYN 9.0 PF PI (GLOVE) ×1 IMPLANT
GOWN SRG 2XL LVL 4 RGLN SLV (GOWNS) ×1 IMPLANT
GOWN STRL NON-REIN 2XL LVL4 (GOWNS) ×2
GOWN STRL REUS W/ TWL LRG LVL3 (GOWN DISPOSABLE) ×1 IMPLANT
GOWN STRL REUS W/TWL LRG LVL3 (GOWN DISPOSABLE) ×2
GUIDEWIRE THREADED 2.8 (WIRE) ×12 IMPLANT
GUIDEWIRE THREADED 2.8MM (WIRE) IMPLANT
KIT TURNOVER KIT A (KITS) ×3 IMPLANT
NEEDLE FILTER BLUNT 18X 1/2SAF (NEEDLE) ×2
NEEDLE FILTER BLUNT 18X1 1/2 (NEEDLE) ×1 IMPLANT
NS IRRIG 500ML POUR BTL (IV SOLUTION) ×3 IMPLANT
PACK HIP COMPR (MISCELLANEOUS) ×3 IMPLANT
SCALPEL PROTECTED #10 DISP (BLADE) ×6 IMPLANT
SCREW CANN 32 THRD/100 7.3 (Screw) ×9 IMPLANT
SCREW CANN 32 THRD/95 7.3 (Screw) ×3 IMPLANT
STAPLER SKIN PROX 35W (STAPLE) ×3 IMPLANT
SUT PROLENE 2 0 FS (SUTURE) ×3 IMPLANT
SUT VIC AB 0 CT1 36 (SUTURE) ×3 IMPLANT
SUT VIC AB 2-0 SH 27 (SUTURE) ×2
SUT VIC AB 2-0 SH 27XBRD (SUTURE) ×1 IMPLANT
SYR 5ML LL (SYRINGE) ×3 IMPLANT
TAPE MICROFOAM 4IN (TAPE) IMPLANT

## 2019-04-14 NOTE — ED Triage Notes (Signed)
Patient lost footing trying to enter his house earlier today after mowing yard. Now c/o left sided hip pain. Patient has had trouble putting weight on leg since fall. Denies dizziness at any point. Denies hitting head.

## 2019-04-14 NOTE — ED Notes (Signed)
Warm blanket placed around pt L leg. Wife at bedside. Call light in placed, pt has no further needs at this time.

## 2019-04-14 NOTE — H&P (Signed)
History and Physical    Ruben Hudson [id number] DOB: 03/07/1926 DOA: 04/14/2019  PCP: Kirk Ruths, MD  Patient coming from: Home.  Chief Complaint: Fall.  HPI: Ruben Hudson is a 85 y.o. male with history of A. fib not on anticoagulation secondary to GI bleed, cardiomyopathy last EF measured was 20% in 2019, ESRD on peritoneal dialysis for last 2 years, hypothyroidism, history of stroke with partial vision loss in the right eye on aspirin had a fall at home when he was trying to open the storm door.  Patient states he blew the leaves in the ER and was getting back to his house when he opened the storm door and he slipped and fell and landed on his left hip area which started hurting.  Did not lose consciousness.  He did scrape his head onto the door but did not hit it hard.  Denies any chest pain or shortness of breath.  ED Course: In the ER CT left hip shows left hip fracture.  On-call orthopedic surgeon and nephrologist were consulted.  Labs reveal WBC of 15.3 potassium 4.3 bicarb 28 chest x-ray shows some congestion COVID-19 is pending patient is afebrile.  EKG shows ectopic atrial rhythm with prolonged QTC.  Patient was given pain relief medications and admitted for further management.  Review of Systems: As per HPI, rest all negative.   Past Medical History:  Diagnosis Date  . Acute pulmonary edema ([place]) 02/26/2013  . Anxiety   . Arthritis    little  . Benign prostatic hyperplasia with urinary obstruction 02/24/2012  . BP (high blood pressure) 10/31/2015  . Cataract extraction status of left eye   . Cataract extraction status of right eye   . Cerebrovascular accident (CVA) ([place]) 04/24/2012   affected right eye, limited peripheral vision  . Chronic kidney disease 07/25/2017   stage 3  . Congestive heart failure ([place]) 10/31/2015  . Dyspnea    uses continuous o2  . Dysrhythmia    AF  . HLD (hyperlipidemia) 10/31/2015  . Hydrocele 02/25/2012  . Myocardial infarction  ([place])    per patient, small MI  . Renal insufficiency   . Unstable angina pectoris ([place]) 02/26/2013  . Urge incontinence of urine 05/30/2014  . Urinary urgency 10/31/2015    Past Surgical History:  Procedure Laterality Date  . ANKLE FRACTURE SURGERY Right 2006   metal in ankle, screws and plate  . CAPD INSERTION N/A 08/01/2017   Procedure: LAPAROSCOPIC INSERTION CONTINUOUS AMBULATORY PERITONEAL DIALYSIS  (CAPD) CATHETER;  Surgeon: Katha Cabal, MD;  Location: [facility];  Service: Vascular;  Laterality: N/A;  . CIRCUMCISION N/A 11/13/2015   Procedure: CIRCUMCISION ADULT;  Surgeon: Hollice Espy, MD;  Location: [facility];  Service: Urology;  Laterality: N/A;  . DIALYSIS/PERMA CATHETER INSERTION N/A 03/17/2017   Procedure: DIALYSIS/PERMA CATHETER INSERTION;  Surgeon: Algernon Huxley, MD;  Location: [place] [facility];  Service: Cardiovascular;  Laterality: N/A;  . DIALYSIS/PERMA CATHETER INSERTION N/A 12/03/2017   Procedure: DIALYSIS/PERMA CATHETER INSERTION;  Surgeon: Katha Cabal, MD;  Location: [place] [facility];  Service: Cardiovascular;  Laterality: N/A;  . DIALYSIS/PERMA CATHETER REMOVAL N/A 09/18/2017   Procedure: DIALYSIS/PERMA CATHETER REMOVAL;  Surgeon: Algernon Huxley, MD;  Location: [place] [facility];  Service: Cardiovascular;  Laterality: N/A;  . DIALYSIS/PERMA CATHETER REMOVAL N/A 01/12/2018   Procedure: DIALYSIS/PERMA CATHETER REMOVAL;  Surgeon: Algernon Huxley, MD;  Location: [place] [facility];  Service: Cardiovascular;  Laterality: N/A;  .  EYE SURGERY Bilateral    cataract extractions  . HERNIA REPAIR    . HYDROCELE EXCISION Bilateral 11/13/2015   Procedure: HYDROCELECTOMY ADULT;  Surgeon: Hollice Espy, MD;  Location: [facility];  Service: Urology;  Laterality: Bilateral;  . TONSILLECTOMY  1950's  . TONSILLECTOMY    . [place]   with mesh  . VARICOCELECTOMY  1948  . VASECTOMY    . VENTRAL HERNIA REPAIR N/A 12/01/2017   Procedure: HERNIA REPAIR  VENTRAL ADULT;  Surgeon: Jules Husbands, MD;  Location: [facility];  Service: General;  Laterality: N/A;     reports that he quit smoking about 64 years ago. His smoking use included cigarettes. He has a 20.00 pack-year smoking history. He has never used smokeless tobacco. He reports that he does not drink alcohol or use drugs.  Allergies  Allergen Reactions  . Eliquis [Apixaban] Other (See Comments)    5 hours of nonstop nasal bleeding.  Required OVN stay in [place].  Occurred 04/2017  . Acyclovir And Related Other (See Comments)    Unknown    Family History  Problem Relation Age of Onset  . Kidney cancer Neg Hx   . Prostate cancer Neg Hx     Prior to Admission medications   Medication Sig Start Date End Date Taking? Authorizing Provider  amiodarone (PACERONE) 200 MG tablet Take 200 mg by mouth daily.   Yes [provider]  aspirin 81 MG tablet Take 81 mg by mouth daily.  02/25/12  Yes [provider]  gentamicin cream (GARAMYCIN) 0.1 % Apply 1 application topically daily. Apply to stomach catheter when changing. 09/03/17  Yes [provider]  levothyroxine (SYNTHROID) 50 MCG tablet Take 50 mcg by mouth daily. 04/03/19  Yes [provider]  midodrine (PROAMATINE) 10 MG tablet Take 10 mg by mouth 3 (three) times daily.    Yes [provider]  multivitamin (RENA-VIT) TABS tablet Take 1 tablet by mouth daily. 06/17/17  Yes [provider]  tamsulosin (FLOMAX) 0.4 MG CAPS capsule Take 0.4 mg by mouth every evening.    Yes [provider]  torsemide (DEMADEX) 20 MG tablet Take 40 mg by mouth daily. 03/15/19  Yes [provider]    Physical Exam: Constitutional: Moderately built and nourished. Vitals:   04/14/19 0232 04/14/19 0300 04/14/19 0330 04/14/19 0430  BP: 109/80 139/70 (!) 126/108 123/65  Pulse: 84 85 79 80  Resp: (!) 26  19   Temp:      TempSrc:      SpO2: 99% 99% 98% 99%  Weight:      Height:        Eyes: Anicteric no pallor. ENMT: No discharge from the ears eyes nose or mouth. Neck: No mass felt.  No neck rigidity. Respiratory: No rhonchi or crepitations. Cardiovascular: S1-S2 heard. Abdomen: Dialysis catheter seen soft nontender. Musculoskeletal: Pain on moving left hip. Skin: No rash. Neurologic: Alert awake oriented to time place and person.  Moves all extremities. Psychiatric: Appears normal per normal affect.   Labs on Admission: I have personally reviewed following labs and imaging studies  CBC: Recent Labs  Lab 04/14/19 0223  WBC 15.3*  NEUTROABS 14.0*  HGB 13.8  HCT 42.5  MCV 98.8  PLT 99991111   Basic Metabolic Panel: Recent Labs  Lab 04/14/19 0223  NA 142  K 4.3  CL 103  CO2 28  GLUCOSE 136*  BUN 41*  CREATININE 3.53*  CALCIUM 8.6*  GFR: Estimated Creatinine Clearance: 11.5 mL/min (A) (by C-G formula based on SCr of 3.53 mg/dL (H)). Liver Function Tests: No results for input(s): AST, ALT, ALKPHOS, BILITOT, PROT, ALBUMIN in the last 168 hours. No results for input(s): LIPASE, AMYLASE in the last 168 hours. No results for input(s): AMMONIA in the last 168 hours. Coagulation Profile: No results for input(s): INR, PROTIME in the last 168 hours. Cardiac Enzymes: No results for input(s): CKTOTAL, CKMB, CKMBINDEX, TROPONINI in the last 168 hours. BNP (last 3 results) No results for input(s): PROBNP in the last 8760 hours. HbA1C: No results for input(s): HGBA1C in the last 72 hours. CBG: No results for input(s): GLUCAP in the last 168 hours. Lipid Profile: No results for input(s): CHOL, HDL, LDLCALC, TRIG, CHOLHDL, LDLDIRECT in the last 72 hours. Thyroid Function Tests: No results for input(s): TSH, T4TOTAL, FREET4, T3FREE, THYROIDAB in the last 72 hours. Anemia Panel: No results for input(s): VITAMINB12, FOLATE, FERRITIN, TIBC, IRON, RETICCTPCT in the last 72 hours. Urine analysis:    Component Value Date/Time   COLORURINE AMBER (A) 03/09/2017  1654   APPEARANCEUR HAZY (A) 03/09/2017 1654   APPEARANCEUR Clear 11/22/2015 1514   LABSPEC 1.014 03/09/2017 1654   LABSPEC 1.008 05/28/2012 1120   PHURINE 5.0 03/09/2017 1654   GLUCOSEU NEGATIVE 03/09/2017 1654   GLUCOSEU Negative 05/28/2012 1120   HGBUR SMALL (A) 03/09/2017 1654   BILIRUBINUR NEGATIVE 03/09/2017 1654   BILIRUBINUR Negative 11/22/2015 1514   BILIRUBINUR Negative 05/28/2012 1120   KETONESUR NEGATIVE 03/09/2017 1654   PROTEINUR 30 (A) 03/09/2017 1654   NITRITE NEGATIVE 03/09/2017 1654   LEUKOCYTESUR NEGATIVE 03/09/2017 1654   LEUKOCYTESUR Negative 11/22/2015 1514   LEUKOCYTESUR Negative 05/28/2012 1120   Sepsis Labs: @LABRCNTIP (procalcitonin:4,lacticidven:4) )No results found for this or any previous visit (from the past 240 hour(s)).   Radiological Exams on Admission: Ct Hip Left Wo Contrast  Result Date: 04/14/2019 CLINICAL DATA:  Fall, left hip pain EXAM: CT OF THE LEFT HIP WITHOUT CONTRAST TECHNIQUE: Multidetector CT imaging of the left hip was performed according to the standard protocol. Multiplanar CT image reconstructions were also generated. COMPARISON:  Plain films earlier today FINDINGS: There is a left femoral neck fracture as seen on plain films. No significant displacement. No angulation. No subluxation or dislocation. IMPRESSION: Nondisplaced left femoral neck fracture. Electronically Signed   By: Rolm Baptise M.D.   On: 04/14/2019 02:17   Dg Chest Portable 1 View  Result Date: 04/14/2019 CLINICAL DATA:  Fall, preop. EXAM: PORTABLE CHEST 1 VIEW COMPARISON:  Chest radiograph 07/20/2018 FINDINGS: Unchanged cardiomegaly. Unchanged mediastinal contours with aortic atherosclerosis. Bilateral pleural effusions, not significantly changed from prior exam. Progressive vascular congestion, suggestion of central pulmonary edema, new. No pneumothorax. No acute osseous abnormalities are seen. IMPRESSION: 1. Chronic cardiomegaly and bilateral pleural effusions,  similar to February 2020 radiograph. 2. Vascular congestion has progressed, suggestion of mild superimposed pulmonary edema is new. Aortic Atherosclerosis (ICD10-I70.0). Electronically Signed   By: Keith Rake M.D.   On: 04/14/2019 01:28   Dg Hip Unilat With Pelvis 2-3 Views Left  Result Date: 04/14/2019 CLINICAL DATA:  Fall, left hip pain EXAM: DG HIP (WITH OR WITHOUT PELVIS) 2-3V LEFT COMPARISON:  None. FINDINGS: There is cortical irregularity noted along the left lateral femoral neck. Lucency noted in the region of the femoral neck. Findings are concerning for subtle left femoral neck fracture. This could be confirmed with CT. No subluxation or dislocation. IMPRESSION: Findings concerning for subtle left femoral neck fracture. This could  be confirmed with CT. Electronically Signed   By: Rolm Baptise M.D.   On: 04/14/2019 01:27    EKG: Independently reviewed.  Ectopic atrial rhythm with prolonged QTC.  Assessment/Plan Principal Problem:   Closed left hip fracture, initial encounter [facility]) Active Problems:   Congestive heart failure (HCC)   ESRD on dialysis [facility])   Atrial fibrillation, chronic ([place])    1. Left hip fracture status post mechanical fall -we will keep patient n.p.o. except medications.  Patient and family requested to have Dr. Jake Samples patient's cardiologist to be consulted for preoperative evaluation. 2. A. fib presently rate controlled on amiodarone not on anticoagulation secondary to history of GI bleed as per the patient. 3. Cardiomyopathy last EF measured was 20% in 2019.  Fluid management per nephrology.  Patient also takes torsemide.  Presently on hold in anticipation of surgery.  Does not appear to be hypoxic. 4. ESRD on peritoneal dialysis nephrology has been notified by the ER physician. 5. Hypothyroidism on Synthroid. 6. History of stroke on aspirin which is on hold for now for surgery.  Restart when okay with orthopedics. 7. Leukocytosis likely reactionary does not  appear to be having any signs of infection.  COVID-19 test is pending.  Given that patient has left hip fracture and will need more than 2 midnight stay will need inpatient status.   DVT prophylaxis: SCDs in anticipation of surgery. Code Status: Full code confirmed with patient and wife. Family Communication: [name]. Disposition Plan: Probably will need rehab. Consults called: Orthopedics, nephrology, cardiology. Admission status: Inpatient.   Rise Patience MD [facility] Pager [phone].  If 7PM-7AM, please contact night-coverage [email or web address] Password [email or web address]  04/14/2019, 4:53 AM

## 2019-04-14 NOTE — Anesthesia Procedure Notes (Signed)
Spinal  Patient location during procedure: OR Start time: 04/14/2019 12:40 PM End time: 04/14/2019 12:45 PM Staffing Anesthesiologist: Emmie Niemann, MD Resident/CRNA: Hedda Slade, CRNA Performed: resident/CRNA  Preanesthetic Checklist Completed: patient identified, site marked, surgical consent, pre-op evaluation, timeout performed, IV checked, risks and benefits discussed and monitors and equipment checked Spinal Block Patient position: sitting Prep: ChloraPrep and DuraPrep Patient monitoring: heart rate, cardiac monitor, continuous pulse ox and blood pressure Approach: midline Location: L3-4 Injection technique: single-shot Needle Needle type: Sprotte  Needle gauge: 24 G Needle length: 9 cm Additional Notes Negative paresthesia. Negative blood return. Positive free-flowing CSF. Expiration date of kit checked and confirmed. Patient tolerated procedure well, without complications.

## 2019-04-14 NOTE — Anesthesia Preprocedure Evaluation (Signed)
Anesthesia Evaluation  Patient identified by MRN, date of birth, ID band Patient awake    Reviewed: Allergy & Precautions, NPO status , Patient's Chart, lab work & pertinent test results  History of Anesthesia Complications Negative for: history of anesthetic complications  Airway Mallampati: II  TM Distance: >3 FB Neck ROM: Full    Dental  (+) Poor Dentition   Pulmonary neg sleep apnea, neg COPD, former smoker,    breath sounds clear to auscultation- rhonchi (-) wheezing      Cardiovascular hypertension, + Past MI and +CHF (EF 20%)  (-) CAD and (-) Cardiac Stents + dysrhythmias Atrial Fibrillation  Rhythm:Regular Rate:Normal - Systolic murmurs and - Diastolic murmurs    Neuro/Psych neg Seizures Anxiety CVA (based on description sounds like clot in retinal arter y)    GI/Hepatic negative GI ROS, Neg liver ROS,   Endo/Other  negative endocrine ROS  Renal/GU ESRF and DialysisRenal disease (daily peritoneal dialysis)     Musculoskeletal  (+) Arthritis ,   Abdominal (+) - obese,   Peds  Hematology negative hematology ROS (+)   Anesthesia Other Findings Past Medical History: 02/26/2013: Acute pulmonary edema (HCC) No date: Anxiety No date: Arthritis     Comment:  little 02/24/2012: Benign prostatic hyperplasia with urinary obstruction 10/31/2015: BP (high blood pressure) No date: Cataract extraction status of left eye No date: Cataract extraction status of right eye 04/24/2012: Cerebrovascular accident (CVA) ([place])     Comment:  affected right eye, limited peripheral vision 07/25/2017: Chronic kidney disease     Comment:  stage 3 10/31/2015: Congestive heart failure ([place]) No date: Dyspnea     Comment:  uses continuous o2 No date: Dysrhythmia     Comment:  AF 10/31/2015: HLD (hyperlipidemia) 02/25/2012: Hydrocele No date: Myocardial infarction [facility])     Comment:  per patient, small MI No date: Renal  insufficiency 02/26/2013: Unstable angina pectoris ([place]) 05/30/2014: Urge incontinence of urine 10/31/2015: Urinary urgency   Reproductive/Obstetrics                             Lab Results  Component Value Date   WBC 15.3 (H) 04/14/2019   HGB 13.8 04/14/2019   HCT 42.5 04/14/2019   MCV 98.8 04/14/2019   PLT 186 04/14/2019    Anesthesia Physical Anesthesia Plan  ASA: III  Anesthesia Plan: Spinal   Post-op Pain Management:    Induction:   PONV Risk Score and Plan: 1 and Propofol infusion  Airway Management Planned: Natural Airway  Additional Equipment:   Intra-op Plan:   Post-operative Plan:   Informed Consent: I have reviewed the patients History and Physical, chart, labs and discussed the procedure including the risks, benefits and alternatives for the proposed anesthesia with the patient or authorized representative who has indicated his/her understanding and acceptance.     Dental advisory given  Plan Discussed with: CRNA and Anesthesiologist  Anesthesia Plan Comments:         Anesthesia Quick Evaluation

## 2019-04-14 NOTE — ED Notes (Signed)
This nurse gave report to same day pre-op nurse.

## 2019-04-14 NOTE — ED Notes (Signed)
This nurse witnessed signature of informed consent for this pt.

## 2019-04-14 NOTE — Consult Note (Signed)
Reason for Consult: Left hip fracture Referring Physician: Dr. Boris Sharper Ruben Hudson is an 85 y.o. male.  HPI: Patient is a 85 year old who walks with cane or walker but is able to get outside of his house.  He had been outside working leaves and stumbled on his way back suffering a fall onto his left hip.  He denies prodromal symptoms.  He was brought to the emergency room where he was found to have an impacted femoral neck fracture and valgus on x-ray and confirmed with CT.  Past Medical History:  Diagnosis Date  . Acute pulmonary edema ([place]) 02/26/2013  . Anxiety   . Arthritis    little  . Benign prostatic hyperplasia with urinary obstruction 02/24/2012  . BP (high blood pressure) 10/31/2015  . Cataract extraction status of left eye   . Cataract extraction status of right eye   . Cerebrovascular accident (CVA) ([place]) 04/24/2012   affected right eye, limited peripheral vision  . Chronic kidney disease 07/25/2017   stage 3  . Congestive heart failure ([place]) 10/31/2015  . Dyspnea    uses continuous o2  . Dysrhythmia    AF  . HLD (hyperlipidemia) 10/31/2015  . Hydrocele 02/25/2012  . Myocardial infarction ([place])    per patient, small MI  . Renal insufficiency   . Unstable angina pectoris ([place]) 02/26/2013  . Urge incontinence of urine 05/30/2014  . Urinary urgency 10/31/2015    Past Surgical History:  Procedure Laterality Date  . ANKLE FRACTURE SURGERY Right 2006   metal in ankle, screws and plate  . CAPD INSERTION N/A 08/01/2017   Procedure: LAPAROSCOPIC INSERTION CONTINUOUS AMBULATORY PERITONEAL DIALYSIS  (CAPD) CATHETER;  Surgeon: Katha Cabal, MD;  Location: [facility];  Service: Vascular;  Laterality: N/A;  . CIRCUMCISION N/A 11/13/2015   Procedure: CIRCUMCISION ADULT;  Surgeon: Hollice Espy, MD;  Location: [facility];  Service: Urology;  Laterality: N/A;  . DIALYSIS/PERMA CATHETER INSERTION N/A 03/17/2017   Procedure: DIALYSIS/PERMA CATHETER INSERTION;  Surgeon: Algernon Huxley, MD;   Location: [facility];  Service: Cardiovascular;  Laterality: N/A;  . DIALYSIS/PERMA CATHETER INSERTION N/A 12/03/2017   Procedure: DIALYSIS/PERMA CATHETER INSERTION;  Surgeon: Katha Cabal, MD;  Location: [facility];  Service: Cardiovascular;  Laterality: N/A;  . DIALYSIS/PERMA CATHETER REMOVAL N/A 09/18/2017   Procedure: DIALYSIS/PERMA CATHETER REMOVAL;  Surgeon: Algernon Huxley, MD;  Location: [facility];  Service: Cardiovascular;  Laterality: N/A;  . DIALYSIS/PERMA CATHETER REMOVAL N/A 01/12/2018   Procedure: DIALYSIS/PERMA CATHETER REMOVAL;  Surgeon: Algernon Huxley, MD;  Location: [facility];  Service: Cardiovascular;  Laterality: N/A;  . EYE SURGERY Bilateral    cataract extractions  . HERNIA REPAIR    . HYDROCELE EXCISION Bilateral 11/13/2015   Procedure: HYDROCELECTOMY ADULT;  Surgeon: Hollice Espy, MD;  Location: [facility];  Service: Urology;  Laterality: Bilateral;  . TONSILLECTOMY  1950's  . TONSILLECTOMY    . [place]   with mesh  . VARICOCELECTOMY  1948  . VASECTOMY    . VENTRAL HERNIA REPAIR N/A 12/01/2017   Procedure: HERNIA REPAIR VENTRAL ADULT;  Surgeon: Jules Husbands, MD;  Location: [facility];  Service: General;  Laterality: N/A;    Family History  Problem Relation Age of Onset  . Kidney cancer Neg Hx   . Prostate cancer Neg Hx     Social History:  reports that he quit smoking about 64 years ago. His smoking use included  cigarettes. He has a 20.00 pack-year smoking history. He has never used smokeless tobacco. He reports that he does not drink alcohol or use drugs.  Allergies:  Allergies  Allergen Reactions  . Eliquis [Apixaban] Other (See Comments)    5 hours of nonstop nasal bleeding.  Required OVN stay in [place].  Occurred 04/2017  . Acyclovir And Related Other (See Comments)    Unknown    Medications: Ruben have reviewed the patient's current medications.  Results for orders placed or performed  during the hospital encounter of 04/14/19 (from the past 48 hour(s))  Basic metabolic panel     Status: Abnormal   Collection Time: 04/14/19  2:23 AM  Result Value Ref Range   Sodium 142 135 - 145 mmol/L   Potassium 4.3 3.5 - 5.1 mmol/L   Chloride 103 98 - 111 mmol/L   CO2 28 22 - 32 mmol/L   Glucose, Bld 136 (H) 70 - 99 mg/dL   BUN 41 (H) 8 - 23 mg/dL   Creatinine, Ser 3.53 (H) 0.61 - 1.24 mg/dL   Calcium 8.6 (L) 8.9 - 10.3 mg/dL   GFR calc non Af Amer 14 (L) >60 mL/min   GFR calc Af Amer 17 (L) >60 mL/min   Anion gap 11 5 - 15    Comment: Performed at [facility], [address] [place], [place] [address]  CBC with Differential     Status: Abnormal   Collection Time: 04/14/19  2:23 AM  Result Value Ref Range   WBC 15.3 (H) 4.0 - 10.5 K/uL   RBC 4.30 4.22 - 5.81 MIL/uL   Hemoglobin 13.8 13.0 - 17.0 g/dL   HCT 42.5 39.0 - 52.0 %   MCV 98.8 80.0 - 100.0 fL   MCH 32.1 26.0 - 34.0 pg   MCHC 32.5 30.0 - 36.0 g/dL   RDW 14.7 11.5 - 15.5 %   Platelets 186 150 - 400 K/uL   nRBC 0.0 0.0 - 0.2 %   Neutrophils Relative % 91 %   Neutro Abs 14.0 (H) 1.7 - 7.7 K/uL   Lymphocytes Relative 2 %   Lymphs Abs 0.3 (L) 0.7 - 4.0 K/uL   Monocytes Relative 6 %   Monocytes Absolute 0.9 0.1 - 1.0 K/uL   Eosinophils Relative 0 %   Eosinophils Absolute 0.0 0.0 - 0.5 K/uL   Basophils Relative 0 %   Basophils Absolute 0.0 0.0 - 0.1 K/uL   Immature Granulocytes 1 %   Abs Immature Granulocytes 0.09 (H) 0.00 - 0.07 K/uL    Comment: Performed at [facility], [address], [place], [place] [address]  Sample to Blood Bank     Status: None   Collection Time: 04/14/19  2:24 AM  Result Value Ref Range   Blood Bank Specimen      Francesco Runner RN 0320 04/14/2019 HNM   Sample Expiration      04/17/2019,2359 Performed at [facility], [address] [place], [place] [address]   Sample to Blood Bank     Status: None   Collection Time: 04/14/19  3:37 AM   Result Value Ref Range   Blood Bank Specimen      Francesco Runner RN 0630 04/14/2019 HNM   Sample Expiration      04/17/2019,2359 Performed at [facility], [address], [place], [place] [address]     Ct Hip Left Wo Contrast  Result Date: 04/14/2019 CLINICAL DATA:  Fall,  left hip pain EXAM: CT OF THE LEFT HIP WITHOUT CONTRAST TECHNIQUE: Multidetector CT imaging of the left hip was performed according to the standard protocol. Multiplanar CT image reconstructions were also generated. COMPARISON:  Plain films earlier today FINDINGS: There is a left femoral neck fracture as seen on plain films. No significant displacement. No angulation. No subluxation or dislocation. IMPRESSION: Nondisplaced left femoral neck fracture. Electronically Signed   By: Rolm Baptise M.D.   On: 04/14/2019 02:17   Dg Chest Portable 1 View  Result Date: 04/14/2019 CLINICAL DATA:  Fall, preop. EXAM: PORTABLE CHEST 1 VIEW COMPARISON:  Chest radiograph 07/20/2018 FINDINGS: Unchanged cardiomegaly. Unchanged mediastinal contours with aortic atherosclerosis. Bilateral pleural effusions, not significantly changed from prior exam. Progressive vascular congestion, suggestion of central pulmonary edema, new. No pneumothorax. No acute osseous abnormalities are seen. IMPRESSION: 1. Chronic cardiomegaly and bilateral pleural effusions, similar to February 2020 radiograph. 2. Vascular congestion has progressed, suggestion of mild superimposed pulmonary edema is new. Aortic Atherosclerosis (ICD10-I70.0). Electronically Signed   By: Keith Rake M.D.   On: 04/14/2019 01:28   Dg Hip Unilat With Pelvis 2-3 Views Left  Result Date: 04/14/2019 CLINICAL DATA:  Fall, left hip pain EXAM: DG HIP (WITH OR WITHOUT PELVIS) 2-3V LEFT COMPARISON:  None. FINDINGS: There is cortical irregularity noted along the left lateral femoral neck. Lucency noted in the region of the femoral neck. Findings are concerning for  subtle left femoral neck fracture. This could be confirmed with CT. No subluxation or dislocation. IMPRESSION: Findings concerning for subtle left femoral neck fracture. This could be confirmed with CT. Electronically Signed   By: Rolm Baptise M.D.   On: 04/14/2019 01:27    ROS Blood pressure 117/66, pulse 76, temperature 98.1 F (36.7 C), temperature source Oral, resp. rate (!) 26, height 5\' 10"  (1.778 m), weight 59.9 kg, SpO2 100 %. Physical Exam his left leg is slightly externally rotated compared to the right he is able to flex extend the toes and has palpable pulses. Radiographic review reveals impacted valgus left femoral neck fracture  Assessment/Plan: 85 year old patient with multiple medical problems suffered a impacted valgus femoral neck fracture.  Ruben discussed with him and his wife the alternatives of pinning versus hemiarthroplasty.  With pinning there is a risk of displacement in the future and need for further surgery but if it successful be much less of a physiologic stress on him and they agree with this as treatment plan.  Plan on left hip pinning later today.  Risks, benefits, possible complications discussed.  Ruben Hudson 04/14/2019, 7:28 AM

## 2019-04-14 NOTE — Op Note (Signed)
04/14/2019  1:36 PM  PATIENT:  Ruben Hudson  85 y.o. male  PRE-OPERATIVE DIAGNOSIS:  HIP FRACTURE- left   POST-OPERATIVE DIAGNOSIS:  HIP FRACTURE- left  PROCEDURE:  Procedure(s): CANNULATED HIP PINNING, LEFT (Left)  SURGEON: Laurene Footman, MD  ASSISTANTS: None  ANESTHESIA:   spinal  EBL:  Total I/O In: 300 [I.V.:300] Out: 50 [Blood:50]  BLOOD ADMINISTERED:none  DRAINS: none   LOCAL MEDICATIONS USED:  NONE  SPECIMEN:  No Specimen  DISPOSITION OF SPECIMEN:  N/A  COUNTS:  YES  TOURNIQUET:  * No tourniquets in log *  IMPLANTS: 7.3 cannulated screws Synthes x4  DICTATION: .Dragon Dictation patient was brought to the operating room and after adequate spinal anesthesia was obtained the patient was placed on the fracture table with the right leg in the well-leg holder and flexed the left foot in the traction boot with no traction applied.  C arm was brought in and there had been no loss of alignment.  After prepping and draping in the usual sterile fashion appropriate patient identification and timeout procedures were completed.  Lateral incision was made approximately an inch and half along with the IT band split and the vastus split with the guidewire inserted in a initially inferior anterior position 3 additional wires were placed parallel to this giving it 4 guidewires up into the femoral head in a parallel fashion.  These were measured drilled tapped and screws placed.  After the screws were the appropriate position in the subchondral bone and no penetration of the hip joint shown on AP and lateral imaging the guidewire was removed after permanent C arm views obtained.  The wound was then irrigated then closure was carried out with a running 0 Vicryl for the IT band with 2-0 Vicryl subcutaneously and skin staples.  Xeroform and honeycomb dressing applied.  PLAN OF CARE: Continue as inpatient  PATIENT DISPOSITION:  PACU - hemodynamically stable.

## 2019-04-14 NOTE — Progress Notes (Signed)
Dr Rudene Christians present in room and aware that pts honeycomb dressing covered with bright red blood, Dr Rudene Christians accessed dressing and states that "its ok"

## 2019-04-14 NOTE — ED Notes (Signed)
Patient transported to CT 

## 2019-04-14 NOTE — Transfer of Care (Signed)
Immediate Anesthesia Transfer of Care Note  Patient: Ruben Hudson  Procedure(s) Performed: CANNULATED HIP PINNING, LEFT (Left Hip)  Patient Location: PACU  Anesthesia Type:Spinal  Level of Consciousness: awake, alert  and oriented  Airway & Oxygen Therapy: Patient Spontanous Breathing  Post-op Assessment: Report given to RN and Post -op Vital signs reviewed and stable  Post vital signs: Reviewed and stable  Last Vitals:  Vitals Value Taken Time  BP 124/68 04/14/19 1341  Temp    Pulse 71 04/14/19 1345  Resp 18 04/14/19 1345  SpO2 100 % 04/14/19 1345  Vitals shown include unvalidated device data.  Last Pain:  Vitals:   04/14/19 1152  TempSrc: Tympanic  PainSc:          Complications: No apparent anesthesia complications

## 2019-04-14 NOTE — Progress Notes (Signed)
PROGRESS NOTE    Ruben Hudson  [id number] DOB: 07/12/1925 DOA: 04/14/2019 PCP: Kirk Ruths, MD      Brief Narrative:  Ruben Hudson is a 85 y.o. male with history of A. fib not on anticoagulation secondary to GI bleed, cardiomyopathy last EF measured was 20% in 2019, ESRD on peritoneal dialysis for last 2 years, hypothyroidism, history of stroke with partial vision loss in the right eye on aspirin had a fall at home when he was trying to open the storm door.  Had a mechanical fall after blowing leaves in yard.  Had immediate hip pain.  Did not lose consciousness. Denied any chest pain or shortness of breath.  In the ER CT left hip shows left hip fracture.  On-call orthopedic surgeon and nephrologist were consulted.  Labs reveal WBC of 15.3 potassium 4.3 bicarb 28 chest x-ray shows some congestion COVID-19 is pending patient is afebrile.  EKG shows ectopic atrial rhythm with prolonged QTC.  Patient was given pain relief medications and admitted for further management.       Assessment & Plan:  Acute left hip fracture -Consult orthopedics, appreciate recommendations -Consult cardiology for preoperative cardiac clearance   Atrial fibrillation, chronic Not on anticoagulation due to GI bleed -Continue amiodarone  Chronic systolic CHF EF [id number] in [id number] -Hold torsemide for now  ESRD -Consult nephrology for routine dialysis -Hold torsemide  Hypothyroidism -Continue levothyroxine  History of stroke -Hold aspirin    DVT prophylaxis: Heparin Code Status: Full code Family Communication: Wife at the bedside MDM and disposition Plan: This is a no charge note.  For further details, please see H&P by my partner Dr. Hal Hope from earlier today.  The below labs and imaging reports were reviewed and summarized above.    The patient was admitted with hip fracture.  He underwent uncomplicated hip fracture this morning, will plan for PT evaluation, then likely placement in 2  to 3 days.    Objective: Vitals:   04/14/19 1556 04/14/19 1611 04/14/19 1707 04/14/19 1739  BP: (!) 126/55  125/61 124/74  Pulse: 72 70 75 79  Resp: (!) 29 (!) 28 18 18   Temp: 97.6 F (36.4 C)  98 F (36.7 C) 97.6 F (36.4 C)  TempSrc:   Oral Oral  SpO2: 99% 94% 93% 91%  Weight:      Height:        Intake/Output Summary (Last 24 hours) at 04/14/2019 1805 Last data filed at 04/14/2019 1332 Gross per 24 hour  Intake 300 ml  Output 50 ml  Net 250 ml   Filed Weights   04/14/19 0034 04/14/19 1152  Weight: 59.9 kg 59.9 kg    Examination: The patient was seen and examined.      Data Reviewed: I have personally reviewed following labs and imaging studies:  CBC: Recent Labs  Lab 04/14/19 0223  WBC 15.3*  NEUTROABS 14.0*  HGB 13.8  HCT 42.5  MCV 98.8  PLT 99991111   Basic Metabolic Panel: Recent Labs  Lab 04/14/19 0223  NA 142  K 4.3  CL 103  CO2 28  GLUCOSE 136*  BUN 41*  CREATININE 3.53*  CALCIUM 8.6*   GFR: Estimated Creatinine Clearance: 11.5 mL/min (A) (by C-G formula based on SCr of 3.53 mg/dL (H)). Liver Function Tests: No results for input(s): AST, ALT, ALKPHOS, BILITOT, PROT, ALBUMIN in the last 168 hours. No results for input(s): LIPASE, AMYLASE in the last 168 hours. No results for input(s): AMMONIA in the last  168 hours. Coagulation Profile: No results for input(s): INR, PROTIME in the last 168 hours. Cardiac Enzymes: No results for input(s): CKTOTAL, CKMB, CKMBINDEX, TROPONINI in the last 168 hours. BNP (last 3 results) No results for input(s): PROBNP in the last 8760 hours. HbA1C: No results for input(s): HGBA1C in the last 72 hours. CBG: No results for input(s): GLUCAP in the last 168 hours. Lipid Profile: No results for input(s): CHOL, HDL, LDLCALC, TRIG, CHOLHDL, LDLDIRECT in the last 72 hours. Thyroid Function Tests: No results for input(s): TSH, T4TOTAL, FREET4, T3FREE, THYROIDAB in the last 72 hours. Anemia Panel: No results for  input(s): VITAMINB12, FOLATE, FERRITIN, TIBC, IRON, RETICCTPCT in the last 72 hours. Urine analysis:    Component Value Date/Time   COLORURINE AMBER (A) 03/09/2017 1654   APPEARANCEUR HAZY (A) 03/09/2017 1654   APPEARANCEUR Clear 11/22/2015 1514   LABSPEC 1.014 03/09/2017 1654   LABSPEC 1.008 05/28/2012 1120   PHURINE 5.0 03/09/2017 1654   GLUCOSEU NEGATIVE 03/09/2017 1654   GLUCOSEU Negative 05/28/2012 1120   HGBUR SMALL (A) 03/09/2017 1654   BILIRUBINUR NEGATIVE 03/09/2017 1654   BILIRUBINUR Negative 11/22/2015 1514   BILIRUBINUR Negative 05/28/2012 1120   KETONESUR NEGATIVE 03/09/2017 1654   PROTEINUR 30 (A) 03/09/2017 1654   NITRITE NEGATIVE 03/09/2017 1654   LEUKOCYTESUR NEGATIVE 03/09/2017 1654   LEUKOCYTESUR Negative 11/22/2015 1514   LEUKOCYTESUR Negative 05/28/2012 1120   Sepsis Labs: @LABRCNTIP (procalcitonin:4,lacticacidven:4)  ) Recent Results (from the past 240 hour(s))  SARS CORONAVIRUS 2 (TAT 6-24 HRS) Nasopharyngeal Nasopharyngeal Swab     Status: None   Collection Time: 04/14/19  3:35 AM   Specimen: Nasopharyngeal Swab  Result Value Ref Range Status   SARS Coronavirus 2 NEGATIVE NEGATIVE Final    Comment: (NOTE) SARS-CoV-2 target nucleic acids are NOT DETECTED. The SARS-CoV-2 RNA is generally detectable in upper and lower respiratory specimens during the acute phase of infection. Negative results do not preclude SARS-CoV-2 infection, do not rule out co-infections with other pathogens, and should not be used as the sole basis for treatment or other patient management decisions. Negative results must be combined with clinical observations, patient history, and epidemiological information. The expected result is Negative. Fact Sheet for Patients: SugarRoll.be Fact Sheet for Healthcare Providers: https://www.woods-mathews.com/ This test is not yet approved or cleared by the [place] FDA and  has been authorized  for detection and/or diagnosis of SARS-CoV-2 by FDA under an Emergency Use Authorization (EUA). This EUA will remain  in effect (meaning this test can be used) for the duration of the COVID-19 declaration under Section 56 4(b)(1) of the Act, 21 U.S.C. section 360bbb-3(b)(1), unless the authorization is terminated or revoked sooner. Performed at [facility], [place] [address], [place], [place] [address]          Radiology Studies: Ct Hip Left Wo Contrast  Result Date: 04/14/2019 CLINICAL DATA:  Fall, left hip pain EXAM: CT OF THE LEFT HIP WITHOUT CONTRAST TECHNIQUE: Multidetector CT imaging of the left hip was performed according to the standard protocol. Multiplanar CT image reconstructions were also generated. COMPARISON:  Plain films earlier today FINDINGS: There is a left femoral neck fracture as seen on plain films. No significant displacement. No angulation. No subluxation or dislocation. IMPRESSION: Nondisplaced left femoral neck fracture. Electronically Signed   By: Rolm Baptise M.D.   On: 04/14/2019 02:17   Dg Chest Portable 1 View  Result Date: 04/14/2019 CLINICAL DATA:  Fall, preop. EXAM: PORTABLE CHEST 1 VIEW COMPARISON:  Chest radiograph  07/20/2018 FINDINGS: Unchanged cardiomegaly. Unchanged mediastinal contours with aortic atherosclerosis. Bilateral pleural effusions, not significantly changed from prior exam. Progressive vascular congestion, suggestion of central pulmonary edema, new. No pneumothorax. No acute osseous abnormalities are seen. IMPRESSION: 1. Chronic cardiomegaly and bilateral pleural effusions, similar to February 2020 radiograph. 2. Vascular congestion has progressed, suggestion of mild superimposed pulmonary edema is new. Aortic Atherosclerosis (ICD10-I70.0). Electronically Signed   By: Keith Rake M.D.   On: 04/14/2019 01:28   Dg Hip Operative Unilat W Or W/o Pelvis Left  Result Date: 04/14/2019 CLINICAL DATA:  Intraoperative left hip fluoroscopy  EXAM: OPERATIVE LEFT HIP WITH PELVIS COMPARISON:  CT hip 04/14/2019, radiographs 04/14/2019 FINDINGS: Sequential intraoperative fluoroscopic views demonstrate placement of 3 partially threaded, cannulated screws transfixing the subcapital left femoral neck fracture seen on comparison radiographs. No evidence of acute hardware complication. Alignment is near anatomic on these nonstandard views. Soft tissues poorly evaluated in the limited fluoroscopic collimation. IMPRESSION: Intraoperative fluoroscopic views demonstrating 3 partially threaded, cannulated screws transfixing the subcapital left femoral neck fracture. No evidence of acute hardware complication. Electronically Signed   By: Lovena Le M.D.   On: 04/14/2019 13:51   Dg Hip Unilat With Pelvis 2-3 Views Left  Result Date: 04/14/2019 CLINICAL DATA:  Fall, left hip pain EXAM: DG HIP (WITH OR WITHOUT PELVIS) 2-3V LEFT COMPARISON:  None. FINDINGS: There is cortical irregularity noted along the left lateral femoral neck. Lucency noted in the region of the femoral neck. Findings are concerning for subtle left femoral neck fracture. This could be confirmed with CT. No subluxation or dislocation. IMPRESSION: Findings concerning for subtle left femoral neck fracture. This could be confirmed with CT. Electronically Signed   By: Rolm Baptise M.D.   On: 04/14/2019 01:27        Scheduled Meds: . amiodarone  200 mg Oral Daily  . [START ON 04/15/2019] Chlorhexidine Gluconate Cloth  6 each Topical Q0600  . docusate sodium  100 mg Oral BID  . gentamicin cream  1 application Topical Daily  . [START ON 04/15/2019] heparin injection (subcutaneous)  5,000 Units Subcutaneous Q8H  . levothyroxine  50 mcg Oral Daily  . midodrine  10 mg Oral TID  . multivitamin  1 tablet Oral Daily  . tamsulosin  0.4 mg Oral QPM   Continuous Infusions: . sodium chloride 75 mL/hr at 04/14/19 1713  .  ceFAZolin (ANCEF) IV    . dialysis solution 1.5% low-MG/low-CA        LOS: 0 days    Time spent: 15 minutes    Edwin Dada, MD [facility] 04/14/2019, 6:05 PM     Please page though [employer] or Epic secure chat:  For password, contact charge nurse

## 2019-04-14 NOTE — ED Notes (Signed)
MD informed that patient does peritoneal dialysis nightly. MD will page nephro

## 2019-04-14 NOTE — Anesthesia Post-op Follow-up Note (Signed)
Anesthesia QCDR form completed.        

## 2019-04-14 NOTE — Consult Note (Signed)
[facility] Cardiology  CARDIOLOGY CONSULT NOTE  Patient ID: Ruben Hudson MRN: [id number] DOB/AGE: 12/06/1925 85 y.o.  Admit date: 04/14/2019 Referring Physician [place] Primary Physician [facility] Cardiologist Fath Reason for Consultation Preoperative cardiovascular evaluation  HPI: 85--year-old gentleman referred for preoperative cardiovascular evaluation prior to hip surgery scheduled for today.  The patient has a history of dilated cardiomyopathy with LVEF 20%, end-stage kidney disease on peritoneal dialysis, history of stroke, history of paroxysmal atrial fibrillation not on chronic anticoagulation with history of GI bleed, on amiodarone, chronic systolic congestive heart failure, and LBBB. The patient sustained a mechanical fall yesterday evening after blowing leaves in his yard. He was transported to [facility] ER where lower extremity CT confirmed impacted femoral neck fracture. Dr. Rudene Christians discussed surgical options with the family, and collaboratively decided to do left hip pinning today. The patient denies any recent chest pain or shortness of breath. He has intermittent palpitations without prolonged heart racing. He denies any recent peripheral edema. He denies recent presyncope or syncope. ECG revealed atrial rhythm with known LBBB at a rate of 84 bpm.  2D echocardiogram 05/01/2018 revealed severe left ventricular systolic dysfunction with LVEF 20% with moderate mitral and tricuspid regurgitation.  Review of systems complete and found to be negative unless listed above     Past Medical History:  Diagnosis Date  . Acute pulmonary edema ([place]) 02/26/2013  . Anxiety   . Arthritis    little  . Benign prostatic hyperplasia with urinary obstruction 02/24/2012  . BP (high blood pressure) 10/31/2015  . Cataract extraction status of left eye   . Cataract extraction status of right eye   . Cerebrovascular accident (CVA) ([place]) 04/24/2012   affected right eye, limited peripheral vision  . Chronic  kidney disease 07/25/2017   stage 3  . Congestive heart failure ([place]) 10/31/2015  . Dyspnea    uses continuous o2  . Dysrhythmia    AF  . HLD (hyperlipidemia) 10/31/2015  . Hydrocele 02/25/2012  . Myocardial infarction ([place])    per patient, small MI  . Renal insufficiency   . Unstable angina pectoris ([place]) 02/26/2013  . Urge incontinence of urine 05/30/2014  . Urinary urgency 10/31/2015    Past Surgical History:  Procedure Laterality Date  . ANKLE FRACTURE SURGERY Right 2006   metal in ankle, screws and plate  . CAPD INSERTION N/A 08/01/2017   Procedure: LAPAROSCOPIC INSERTION CONTINUOUS AMBULATORY PERITONEAL DIALYSIS  (CAPD) CATHETER;  Surgeon: Katha Cabal, MD;  Location: [facility] ORS;  Service: Vascular;  Laterality: N/A;  . CIRCUMCISION N/A 11/13/2015   Procedure: CIRCUMCISION ADULT;  Surgeon: Hollice Espy, MD;  Location: [facility] ORS;  Service: Urology;  Laterality: N/A;  . DIALYSIS/PERMA CATHETER INSERTION N/A 03/17/2017   Procedure: DIALYSIS/PERMA CATHETER INSERTION;  Surgeon: Algernon Huxley, MD;  Location: [place] CV LAB;  Service: Cardiovascular;  Laterality: N/A;  . DIALYSIS/PERMA CATHETER INSERTION N/A 12/03/2017   Procedure: DIALYSIS/PERMA CATHETER INSERTION;  Surgeon: Katha Cabal, MD;  Location: [place] CV LAB;  Service: Cardiovascular;  Laterality: N/A;  . DIALYSIS/PERMA CATHETER REMOVAL N/A 09/18/2017   Procedure: DIALYSIS/PERMA CATHETER REMOVAL;  Surgeon: Algernon Huxley, MD;  Location: [place] CV LAB;  Service: Cardiovascular;  Laterality: N/A;  . DIALYSIS/PERMA CATHETER REMOVAL N/A 01/12/2018   Procedure: DIALYSIS/PERMA CATHETER REMOVAL;  Surgeon: Algernon Huxley, MD;  Location: [place] CV LAB;  Service: Cardiovascular;  Laterality: N/A;  . EYE SURGERY Bilateral    cataract extractions  . HERNIA REPAIR    .  HYDROCELE EXCISION Bilateral 11/13/2015   Procedure: HYDROCELECTOMY ADULT;  Surgeon: Hollice Espy, MD;  Location: [facility] ORS;  Service: Urology;   Laterality: Bilateral;  . TONSILLECTOMY  1950's  . TONSILLECTOMY    . [place]   with mesh  . VARICOCELECTOMY  1948  . VASECTOMY    . VENTRAL HERNIA REPAIR N/A 12/01/2017   Procedure: HERNIA REPAIR VENTRAL ADULT;  Surgeon: Jules Husbands, MD;  Location: [facility] ORS;  Service: General;  Laterality: N/A;    (Not in a hospital admission)  Social History   Socioeconomic History  . Marital status: Married    Spouse name: Not on file  . Number of children: Not on file  . Years of education: Not on file  . Highest education level: Not on file  Occupational History  . Not on file  Social Needs  . Financial resource strain: Not on file  . Food insecurity    Worry: Not on file    Inability: Not on file  . Transportation needs    Medical: Not on file    Non-medical: Not on file  Tobacco Use  . Smoking status: Former Smoker    Packs/day: 1.00    Years: 20.00    Pack years: 20.00    Types: Cigarettes    Quit date: 11/03/1954    Years since quitting: 64.4  . Smokeless tobacco: Never Used  . Tobacco comment: started smoking at 85 years old  Substance and Sexual Activity  . Alcohol use: No    Alcohol/week: 8.0 standard drinks    Types: 7 Shots of liquor, 1 Standard drinks or equivalent per week    Frequency: Never    Comment: 1 shot of gin a day.no gin x 1 year since dialysis  . Drug use: No  . Sexual activity: Not on file  Lifestyle  . Physical activity    Days per week: Not on file    Minutes per session: Not on file  . Stress: Not on file  Relationships  . Social Herbalist on phone: Not on file    Gets together: Not on file    Attends religious service: Not on file    Active member of club or organization: Not on file    Attends meetings of clubs or organizations: Not on file    Relationship status: Not on file  . Intimate partner violence    Fear of current or ex partner: Not on file    Emotionally abused: Not on file    Physically abused:  Not on file    Forced sexual activity: Not on file  Other Topics Concern  . Not on file  Social History Narrative  . Not on file    Family History  Problem Relation Age of Onset  . Kidney cancer Neg Hx   . Prostate cancer Neg Hx       Review of systems complete and found to be negative unless listed above      PHYSICAL EXAM  General: Frail elderly gentleman lying supine in bed in no acute distress HEENT:  Normocephalic and atramatic Neck:  No JVD.  Lungs:  Normal effort of breathing on room air, no audible wheezing Heart: HRRR . Normal S1 and S2 without gallops or murmurs.  Abdomen: nondistended Msk: left leg externally rotated Extremities: No clubbing, cyanosis or edema.   Neuro: Alert and oriented X 3. Psych:  Good affect, responds appropriately  Labs:   Lab  Results  Component Value Date   WBC 15.3 (H) 04/14/2019   HGB 13.8 04/14/2019   HCT 42.5 04/14/2019   MCV 98.8 04/14/2019   PLT 186 04/14/2019    Recent Labs  Lab 04/14/19 0223  NA 142  K 4.3  CL 103  CO2 28  BUN 41*  CREATININE 3.53*  CALCIUM 8.6*  GLUCOSE 136*   Lab Results  Component Value Date   CKTOTAL 157 05/28/2012   CKMB 4.3 (H) 05/28/2012   TROPONINI 0.06 (HH) 12/01/2017    Lab Results  Component Value Date   CHOL 128 05/29/2012   Lab Results  Component Value Date   HDL 54 05/29/2012   Lab Results  Component Value Date   LDLCALC 64 05/29/2012   Lab Results  Component Value Date   TRIG 52 05/29/2012   No results found for: CHOLHDL No results found for: LDLDIRECT    Radiology: Ct Hip Left Wo Contrast  Result Date: 04/14/2019 CLINICAL DATA:  Fall, left hip pain EXAM: CT OF THE LEFT HIP WITHOUT CONTRAST TECHNIQUE: Multidetector CT imaging of the left hip was performed according to the standard protocol. Multiplanar CT image reconstructions were also generated. COMPARISON:  Plain films earlier today FINDINGS: There is a left femoral neck fracture as seen on plain films. No  significant displacement. No angulation. No subluxation or dislocation. IMPRESSION: Nondisplaced left femoral neck fracture. Electronically Signed   By: Rolm Baptise M.D.   On: 04/14/2019 02:17   Dg Chest Portable 1 View  Result Date: 04/14/2019 CLINICAL DATA:  Fall, preop. EXAM: PORTABLE CHEST 1 VIEW COMPARISON:  Chest radiograph 07/20/2018 FINDINGS: Unchanged cardiomegaly. Unchanged mediastinal contours with aortic atherosclerosis. Bilateral pleural effusions, not significantly changed from prior exam. Progressive vascular congestion, suggestion of central pulmonary edema, new. No pneumothorax. No acute osseous abnormalities are seen. IMPRESSION: 1. Chronic cardiomegaly and bilateral pleural effusions, similar to February 2020 radiograph. 2. Vascular congestion has progressed, suggestion of mild superimposed pulmonary edema is new. Aortic Atherosclerosis (ICD10-I70.0). Electronically Signed   By: Keith Rake M.D.   On: 04/14/2019 01:28   Dg Hip Unilat With Pelvis 2-3 Views Left  Result Date: 04/14/2019 CLINICAL DATA:  Fall, left hip pain EXAM: DG HIP (WITH OR WITHOUT PELVIS) 2-3V LEFT COMPARISON:  None. FINDINGS: There is cortical irregularity noted along the left lateral femoral neck. Lucency noted in the region of the femoral neck. Findings are concerning for subtle left femoral neck fracture. This could be confirmed with CT. No subluxation or dislocation. IMPRESSION: Findings concerning for subtle left femoral neck fracture. This could be confirmed with CT. Electronically Signed   By: Rolm Baptise M.D.   On: 04/14/2019 01:27    EKG: atrial rhythm, rate 84 bpm LBBB LVH  ASSESSMENT AND PLAN:  85 year old gentleman referred for preoperative cardiovascular evaluation prior to left hip pinning scheduled for today. 1.  Dilated cardiomyopathy with LVEF 20% per echocardiogram 04/2018.  On optimal medical therapy, such as ACE, ARB, or beta-blocker due to baseline hypotension, treated with  midodrine. 2.  Paroxysmal atrial fibrillation, on amiodarone rate controlled, not on anticoagulation due to history of GI bleed.  3.  Chronic systolic congestive heart failure, appears euvolemic 4.  Hypotension, on midodrine 5.  Left hip fracture secondary to mechanical fall   Recommendations: 1.  Proceed with left hip pinning per Dr. Rudene Christians if deemed medically necessary. The patient is considered to be a moderate risk for serious cardiovascular complications when undergoing this procedure secondary to his advanced age  and multiple comorbidities. 2.  No further cardiac diagnostics recommended.  3.  Sign off for now: Please call with any questions  Signed: Clabe Seal PA-C 04/14/2019, 9:17 AM   Discussed with Dr. Saralyn Pilar who agrees with the above plan.

## 2019-04-14 NOTE — ED Provider Notes (Signed)
torm   [facility] Emergency Department Provider Note  ____________________________________________   First MD Initiated Contact with Patient 04/14/19 0117     (approximate)  I have reviewed the triage vital signs and the nursing notes.  History  Chief Complaint Hip Pain    HPI Ruben Hudson is a 85 y.o. male history of ESRD on nightly PD, cardiomyopathy with reduced EF, on [place] at home, who presents to the emergency department for LEFT hip pain after a non-syncopal fall.  Patient states around 5 PM this evening he was trying to open a storm door when he lost his balance, causing him to fall on his LEFT side.  He reports acute pain to the left hip, 10/10 in severity, described as aching.  Radiates somewhat down his leg.  He denies any associated numbness or tingling.  He denies any other injuries related to the fall.  He denies any known head injury or LOC.  He denies any preceding presyncopal symptoms, denies any preceding chest pain, palpitations, visual changes or difficulty breathing.  He denies any resultant head pain, neck pain, chest pain, abdominal pain, back pain, or other extremity pain aside from noted above. He has been unable to bear weight since the fall, which worsens his pain.   Past Medical Hx Past Medical History:  Diagnosis Date  . Acute pulmonary edema ([place]) 02/26/2013  . Anxiety   . Arthritis    little  . Benign prostatic hyperplasia with urinary obstruction 02/24/2012  . BP (high blood pressure) 10/31/2015  . Cataract extraction status of left eye   . Cataract extraction status of right eye   . Cerebrovascular accident (CVA) ([place]) 04/24/2012   affected right eye, limited peripheral vision  . Chronic kidney disease 07/25/2017   stage 3  . Congestive heart failure ([place]) 10/31/2015  . Dyspnea    uses continuous o2  . Dysrhythmia    AF  . HLD (hyperlipidemia) 10/31/2015  . Hydrocele 02/25/2012  . Myocardial infarction ([place])    per patient,  small MI  . Renal insufficiency   . Unstable angina pectoris ([place]) 02/26/2013  . Urge incontinence of urine 05/30/2014  . Urinary urgency 10/31/2015    Problem List Patient Active Problem List   Diagnosis Date Noted  . Protein-calorie malnutrition, severe 12/04/2017  . Ventral hernia with bowel obstruction 12/01/2017  . Incarcerated umbilical hernia   . ESRD (end stage renal disease) ([place]) 08/01/2017  . ESRD on dialysis ([place]) 07/09/2017  . Severe epistaxis 04/07/2017  . Palliative care by specialist   . Goals of care, counseling/discussion   . Atrial fibrillation with RVR ([place]) 03/09/2017  . Recurrent umbilical hernia with incarceration   . Small bowel obstruction ([place])   . SOB (shortness of breath) 09/23/2016  . Acute on chronic systolic CHF (congestive heart failure) ([place]) 08/23/2016  . CKD (chronic kidney disease), stage III 08/23/2016  . Elevated troponin 08/23/2016  . Thrombocytopenia ([place]) 08/23/2016  . Hyperkalemia 11/06/2015  . Urinary urgency 10/31/2015  . Chronic kidney disease, stage 3 ([place]) 10/31/2015  . Congestive heart failure ([place]) 10/31/2015  . Hyperlipidemia 10/31/2015  . BP (high blood pressure) 10/31/2015  . Cerebrovascular accident (CVA) ([place]) 10/31/2015  . Phimosis 10/31/2015  . 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 Benign fibroma of prostate 05/30/2014  . Urge incontinence of urine 05/30/2014  . Chronic kidney disease, stage IV (severe) ([place]) 02/16/2014  . Acute pulmonary edema ([place]) 02/26/2013  . Unstable angina pectoris ([place]) 02/26/2013  .  Hydrocele 02/25/2012  . Benign prostatic hyperplasia with urinary obstruction 02/24/2012    Past Surgical Hx Past Surgical History:  Procedure Laterality Date  . ANKLE FRACTURE SURGERY Right 2006   metal in ankle, screws and plate  . CAPD INSERTION N/A 08/01/2017   Procedure: LAPAROSCOPIC INSERTION CONTINUOUS AMBULATORY PERITONEAL DIALYSIS  (CAPD) CATHETER;  Surgeon: Katha Cabal, MD;   Location: [facility] ORS;  Service: Vascular;  Laterality: N/A;  . CIRCUMCISION N/A 11/13/2015   Procedure: CIRCUMCISION ADULT;  Surgeon: Hollice Espy, MD;  Location: [facility] ORS;  Service: Urology;  Laterality: N/A;  . DIALYSIS/PERMA CATHETER INSERTION N/A 03/17/2017   Procedure: DIALYSIS/PERMA CATHETER INSERTION;  Surgeon: Algernon Huxley, MD;  Location: [place] CV LAB;  Service: Cardiovascular;  Laterality: N/A;  . DIALYSIS/PERMA CATHETER INSERTION N/A 12/03/2017   Procedure: DIALYSIS/PERMA CATHETER INSERTION;  Surgeon: Katha Cabal, MD;  Location: [place] CV LAB;  Service: Cardiovascular;  Laterality: N/A;  . DIALYSIS/PERMA CATHETER REMOVAL N/A 09/18/2017   Procedure: DIALYSIS/PERMA CATHETER REMOVAL;  Surgeon: Algernon Huxley, MD;  Location: [place] CV LAB;  Service: Cardiovascular;  Laterality: N/A;  . DIALYSIS/PERMA CATHETER REMOVAL N/A 01/12/2018   Procedure: DIALYSIS/PERMA CATHETER REMOVAL;  Surgeon: Algernon Huxley, MD;  Location: [place] CV LAB;  Service: Cardiovascular;  Laterality: N/A;  . EYE SURGERY Bilateral    cataract extractions  . HERNIA REPAIR    . HYDROCELE EXCISION Bilateral 11/13/2015   Procedure: HYDROCELECTOMY ADULT;  Surgeon: Hollice Espy, MD;  Location: [facility] ORS;  Service: Urology;  Laterality: Bilateral;  . TONSILLECTOMY  1950's  . TONSILLECTOMY    . [place]   with mesh  . VARICOCELECTOMY  1948  . VASECTOMY    . VENTRAL HERNIA REPAIR N/A 12/01/2017   Procedure: HERNIA REPAIR VENTRAL ADULT;  Surgeon: Jules Husbands, MD;  Location: [facility] ORS;  Service: General;  Laterality: N/A;    Medications Prior to Admission medications   Medication Sig Start Date End Date Taking? Authorizing Provider  acetaminophen (TYLENOL) 500 MG tablet Take 500 mg by mouth daily as needed for moderate pain or headache.    [provider]  ALPRAZolam Duanne Moron) 0.25 MG tablet Take 0.25 mg by mouth at bedtime as needed for sleep. 08/19/16   [provider]  amiodarone (PACERONE) 200 MG tablet Take 1 tablet (200 mg total) by mouth daily. 03/21/17 12/02/18  Henreitta Leber, MD  aspirin 81 MG tablet Take 81 mg by mouth daily.  02/25/12   [provider]  furosemide (LASIX) 40 MG tablet Take 1 tablet (40 mg total) by mouth daily. Patient taking differently: Take 40 mg by mouth 2 (two) times daily.  03/20/17   Henreitta Leber, MD  gentamicin cream (GARAMYCIN) 0.1 % Apply 1 application topically daily. Apply to stomach catheter when changing. 09/03/17   [provider]  KLOR-CON M10 10 MEQ tablet Take 10 mEq by mouth daily. 03/01/17   [provider]  midodrine (PROAMATINE) 5 MG tablet Take 5 mg by mouth 3 (three) times daily.    [provider]  multivitamin (RENA-VIT) TABS tablet Take 1 tablet by mouth daily. 06/17/17   [provider]  sodium chloride (OCEAN) 0.65 % SOLN nasal spray Place 1 spray into both nostrils as needed for congestion.    [provider]  tamsulosin (FLOMAX) 0.4 MG CAPS capsule Take 0.4 mg by mouth every evening.     [provider]    Allergies Eliquis [apixaban]  and Acyclovir and related  Family Hx Family History  Problem Relation Age of Onset  . Kidney cancer Neg Hx   . Prostate cancer Neg Hx     Social Hx Social History   Tobacco Use  . Smoking status: Former Smoker    Packs/day: 1.00    Years: 20.00    Pack years: 20.00    Types: Cigarettes    Quit date: 11/03/1954    Years since quitting: 64.4  . Smokeless tobacco: Never Used  . Tobacco comment: started smoking at 85 years old  Substance Use Topics  . Alcohol use: No    Alcohol/week: 8.0 standard drinks    Types: 7 Shots of liquor, 1 Standard drinks or equivalent per week    Frequency: Never    Comment: 1 shot of gin a day.no gin x 1 year since dialysis  . Drug use: No     Review of Systems  Constitutional: Negative for fever, chills. Eyes: Negative for visual changes. ENT:  Negative for sore throat. Cardiovascular: Negative for chest pain. Respiratory: Negative for shortness of breath. Gastrointestinal: Negative for nausea, vomiting.  Genitourinary: Negative for dysuria. Musculoskeletal: + LEFT hip pain Skin: Negative for rash. Neurological: Negative for for headaches.   Physical Exam  Vital Signs: ED Triage Vitals  Enc Vitals Group     BP 04/14/19 0032 133/61     Pulse Rate 04/14/19 0032 71     Resp 04/14/19 0032 20     Temp 04/14/19 0032 98.1 F (36.7 C)     Temp Source 04/14/19 0032 Oral     SpO2 04/14/19 0032 96 %     Weight 04/14/19 0034 132 lb 0.9 oz (59.9 kg)     Height 04/14/19 0034 5\' 10"  (1.778 m)     Head Circumference --      Peak Flow --      Pain Score 04/14/19 0033 10     Pain Loc --      Pain Edu? --      Excl. in [place]? --     Constitutional: Alert and oriented.  Head: Normocephalic. Atraumatic. Eyes: Conjunctivae clear. Sclera anicteric. Nose: On .  Mouth/Throat: Wearing mask.  Neck: No stridor.  No midline CS tenderness. Cardiovascular: Normal rate, regular rhythm. Extremities well perfused. Respiratory: Normal respiratory effort.   Chest: Chest wall stable and NT to palpation.  No crepitance. Gastrointestinal: Soft. Non-tender. Non-distended.  PD catheter in place. Pelvis: Left-sided tenderness to palpation, ROM deferred 2/2 suspected injury.  Right side non-tender, no discomfort with gentle logroll on right. Musculoskeletal: FROM bilateral shoulders, elbows, wrists. FROM to RIGHT hip, knee, ankle.  Neurologic:  Normal speech and language. No gross focal neurologic deficits are appreciated.  Skin: Skin is warm, dry and intact. No rash noted. Psychiatric: Mood and affect are appropriate for situation.  EKG  Personally reviewed.   Rate: 84 Rhythm: sinus Axis: RAD Intervals: prolonged QTC, widened QRS 2/2 IVCD (LBBB) LVH Abnormal EKG, though appears grossly unchanged from prior No STEMI    Radiology  CT  hip: IMPRESSION:  Nondisplaced left femoral neck fracture.    Procedures  Procedure(s) performed (including critical care):  Procedures   Initial Impression / Assessment and Plan / ED Course  85 y.o. male who presents to the ED for a non-syncopal fall with resultant LEFT hip pain.  Work-up reveals a nondisplaced left femoral neck fracture.  Orthopedics aware.  Will admit.  Will obtain basic screening labs, XR, EKG in anticipation of  OR.   Final Clinical Impression(s) / ED Diagnosis  Final diagnoses:  Closed fracture of left hip, initial encounter [facility])       Note:  This document was prepared using Dragon voice recognition software and may include unintentional dictation errors.   Lilia Pro., MD 04/14/19 [phone]

## 2019-04-14 NOTE — Progress Notes (Signed)
Consent signed for Dialysis.

## 2019-04-15 ENCOUNTER — Encounter: Payer: Self-pay | Admitting: Orthopedic Surgery

## 2019-04-15 LAB — CBC
HCT: 37.5 % — ABNORMAL LOW (ref 39.0–52.0)
Hemoglobin: 11.8 g/dL — ABNORMAL LOW (ref 13.0–17.0)
MCH: 31.6 pg (ref 26.0–34.0)
MCHC: 31.5 g/dL (ref 30.0–36.0)
MCV: 100.3 fL — ABNORMAL HIGH (ref 80.0–100.0)
Platelets: 162 10*3/uL (ref 150–400)
RBC: 3.74 MIL/uL — ABNORMAL LOW (ref 4.22–5.81)
RDW: 14.6 % (ref 11.5–15.5)
WBC: 10.3 10*3/uL (ref 4.0–10.5)
nRBC: 0 % (ref 0.0–0.2)

## 2019-04-15 LAB — BASIC METABOLIC PANEL
Anion gap: 11 (ref 5–15)
BUN: 35 mg/dL — ABNORMAL HIGH (ref 8–23)
CO2: 25 mmol/L (ref 22–32)
Calcium: 8.3 mg/dL — ABNORMAL LOW (ref 8.9–10.3)
Chloride: 106 mmol/L (ref 98–111)
Creatinine, Ser: 2.92 mg/dL — ABNORMAL HIGH (ref 0.61–1.24)
GFR calc Af Amer: 21 mL/min — ABNORMAL LOW (ref >60)
GFR calc non Af Amer: 18 mL/min — ABNORMAL LOW (ref >60)
Glucose, Bld: 160 mg/dL — ABNORMAL HIGH (ref 70–99)
Potassium: 4.1 mmol/L (ref 3.5–5.1)
Sodium: 142 mmol/L (ref 135–145)

## 2019-04-15 MED ORDER — HYDROMORPHONE HCL 1 MG/ML IJ SOLN: 0.4000 mg | INTRAMUSCULAR | Status: DC | PRN

## 2019-04-15 MED ORDER — ACETAMINOPHEN 500 MG PO TABS
1000.0000 mg | ORAL_TABLET | Freq: Three times a day (TID) | ORAL | Status: DC
  Administered 2019-04-15 – 2019-04-19 (×13): 1000 mg via ORAL
  Filled 2019-04-15 (×13): qty 2

## 2019-04-15 MED ORDER — OXYCODONE HCL 5 MG PO TABS
5.0000 mg | ORAL_TABLET | Freq: Four times a day (QID) | ORAL | Status: DC | PRN
  Administered 2019-04-18: 5 mg via ORAL
  Filled 2019-04-15: qty 1

## 2019-04-15 MED ORDER — NEPRO/CARBSTEADY PO LIQD
237.0000 mL | Freq: Two times a day (BID) | ORAL | Status: DC
  Administered 2019-04-15 – 2019-04-19 (×8): 237 mL via ORAL

## 2019-04-15 MED ORDER — ASPIRIN EC 81 MG PO TBEC
81.0000 mg | DELAYED_RELEASE_TABLET | Freq: Every day | ORAL | Status: DC
  Administered 2019-04-15 – 2019-04-19 (×5): 81 mg via ORAL
  Filled 2019-04-15 (×5): qty 1

## 2019-04-15 NOTE — Evaluation (Signed)
Physical Therapy Evaluation Patient Details Name: Ruben Hudson MRN: [id number] DOB: January 28, 1926 Today's Date: 04/15/2019   History of Present Illness  Pt is a 85 y.o. male presenting to hospital 04/14/19 s/p fall opening storm door with L hip pain.  Imaging showing nondisplaced L femoral neck fx and s/p L cannulated hip pinning 04/14/19.  PMH includes ESRD on nightly peritoneal dialysis, cardiomyopathy with reduced EF, home O2, CVA, R eye with limited peripheral vision, CKD stage 3, CHF, dysrhythmia, MI, a-fib wit RVR, h/o R ankle fx surgery, 2 L home O2 chronic.  Clinical Impression  Prior to hospital admission, pt was modified independent with ambulation (used walking cane outside and 4ww in home).  Pt lives with his wife in 1 level home with steps to enter.  Currently pt is mod to max assist semi-supine to sit; mod assist to stand up to RW from mildly elevated bed, and min to mod assist to ambulate a few feet with RW bed to recliner (see below for details).  Pt would benefit from skilled PT to address noted impairments and functional limitations (see below for any additional details).  Upon hospital discharge, currently anticipate pt would benefit from STR.    Follow Up Recommendations SNF    Equipment Recommendations  Rolling walker with 5" wheels;3in1 (PT)    Recommendations for Other Services OT consult     Precautions / Restrictions Precautions Precautions: Fall Precaution Comments: peritoneal dialysis; R subclavian HD catheter Restrictions Weight Bearing Restrictions: Yes LLE Weight Bearing: Weight bearing as tolerated      Mobility  Bed Mobility Overal bed mobility: Needs Assistance Bed Mobility: Supine to Sit     Supine to sit: Mod assist;Max assist;HOB elevated     General bed mobility comments: assist for L LE and trunk; increased time to perform d/t L hip pain; vc's for overall technique  Transfers Overall transfer level: Needs assistance Equipment used:  Rolling walker (2 wheeled) Transfers: Sit to/from Stand Sit to Stand: Mod assist;From elevated surface         General transfer comment: assist to initiate and come to full stand; vc's for UE/LE placement; bed height mildly elevated  Ambulation/Gait Ambulation/Gait assistance: Min assist;Mod assist Gait Distance (Feet): 3 Feet(bed to recliner) Assistive device: Rolling walker (2 wheeled)   Gait velocity: decreased   General Gait Details: antalgic; decreased stance time L LE; L LE knee initially buckling when attempting to take step with R LE but with vc's for increasing UE support through RW and keeping walker closer pt able to offweight L LE when advancing R LE and no further buckling noted  Stairs            Wheelchair Mobility    Modified Rankin (Stroke Patients Only)       Balance Overall balance assessment: Needs assistance Sitting-balance support: No upper extremity supported;Feet supported Sitting balance-Leahy Scale: Good Sitting balance - Comments: steady sitting reaching within BOS   Standing balance support: Bilateral upper extremity supported Standing balance-Leahy Scale: Poor Standing balance comment: pt requiring B UE support on RW for static standing balance                             Pertinent Vitals/Pain Pain Assessment: 0-10 Pain Score: 9  Pain Location: L hip/thigh Pain Descriptors / Indicators: Guarding;Constant;Discomfort;Aching;Tender Pain Intervention(s): Limited activity within patient's tolerance;Monitored during session;Premedicated before session;Repositioned  Vitals (HR and O2 on room air) stable and WFL throughout treatment  session.    Home Living Family/patient expects to be discharged to:: Private residence Living Arrangements: Spouse/significant other Available Help at Discharge: Family Type of Home: House Home Access: Stairs to enter Entrance Stairs-Rails: Right(wall on the L) Entrance Stairs-Number of Steps:  3 Home Layout: One level(does not have to go to the basement) Home Equipment: Walker - 4 wheels;Other (comment);Bedside commode;Shower seat - built in;Grab bars - toilet;Grab bars - tub/shower(walking stick)      Prior Function Level of Independence: Independent with assistive device(s)         Comments: 2 L O2 at baseline.  Uses 4ww in home and walking cane outside.  Peritoneal dialysis at night.     Hand Dominance        Extremity/Trunk Assessment   Upper Extremity Assessment Upper Extremity Assessment: Overall WFL for tasks assessed    Lower Extremity Assessment Lower Extremity Assessment: RLE deficits/detail;LLE deficits/detail RLE Deficits / Details: strength and ROM WFL LLE Deficits / Details: fair L quad set strength; at least 3/5 AROM DF/PF; hip flexion at least 3-/5 (limited d/t L hip/thigh pain) LLE: Unable to fully assess due to pain    Cervical / Trunk Assessment Cervical / Trunk Assessment: Normal  Communication   Communication: No difficulties  Cognition Arousal/Alertness: Awake/alert Behavior During Therapy: Impulsive                                   General Comments: Oriented to person and place and situation; generalized confusion noted      General Comments General comments (skin integrity, edema, etc.): minimal drainage noted L thigh dressing.  Nursing cleared pt for participation in physical therapy.  Pt agreeable to PT session.  Pt's wife present during most of session.    Exercises Total Joint Exercises Ankle Circles/Pumps: AROM;Strengthening;Both;10 reps;Supine Quad Sets: AROM;Strengthening;Both;10 reps;Supine Short Arc Quad: AAROM;Strengthening;Left;10 reps;Supine Heel Slides: AAROM;Strengthening;Left;10 reps;Supine Hip ABduction/ADduction: AAROM;Strengthening;Left;10 reps;Supine   Assessment/Plan    PT Assessment Patient needs continued PT services  PT Problem List Decreased strength;Decreased activity  tolerance;Decreased balance;Decreased mobility;Decreased knowledge of use of DME;Decreased knowledge of precautions;Pain;Decreased skin integrity       PT Treatment Interventions DME instruction;Gait training;Stair training;Functional mobility training;Therapeutic activities;Therapeutic exercise;Balance training;Patient/family education    PT Goals (Current goals can be found in the Care Plan section)  Acute Rehab PT Goals Patient Stated Goal: to improve mobility and have less pain PT Goal Formulation: With patient/family Time For Goal Achievement: 04/29/19 Potential to Achieve Goals: Good    Frequency BID   Barriers to discharge Decreased caregiver support      Co-evaluation               AM-PAC PT "6 Clicks" Mobility  Outcome Measure Help needed turning from your back to your side while in a flat bed without using bedrails?: A Lot Help needed moving from lying on your back to sitting on the side of a flat bed without using bedrails?: A Lot Help needed moving to and from a bed to a chair (including a wheelchair)?: A Lot Help needed standing up from a chair using your arms (e.g., wheelchair or bedside chair)?: A Lot Help needed to walk in hospital room?: Total Help needed climbing 3-5 steps with a railing? : Total 6 Click Score: 10    End of Session Equipment Utilized During Treatment: Gait belt Activity Tolerance: Patient limited by pain Patient left: in chair;with call bell/phone within reach;with  chair alarm set;with family/visitor present;Other (comment)(B heels floating via pillows; no SCD's on upon PT arrival (pt's wife indicating d/t pt not wanting them on)) Nurse Communication: Mobility status;Precautions;Weight bearing status PT Visit Diagnosis: Other abnormalities of gait and mobility (R26.89);Muscle weakness (generalized) (M62.81);History of falling (Z91.81);Difficulty in walking, not elsewhere classified (R26.2);Pain Pain - Right/Left: Left Pain - part of body:  Hip    Time: 1130-1209 PT Time Calculation (min) (ACUTE ONLY): 39 min   Charges:   PT Evaluation $PT Eval Low Complexity: 1 Low PT Treatments $Therapeutic Exercise: 8-22 mins       Leitha Bleak, PT 04/15/19, 1:23 PM [phone]

## 2019-04-15 NOTE — Progress Notes (Signed)
Established peritoneal dialysis patient known at [facility]). If patient plans on discharging to a rehab, please contact me as soon as possible to get patient set up for a hemodialysis chair.   Elvera Bicker Dialysis Coordinator [phone]

## 2019-04-15 NOTE — Progress Notes (Signed)
D: Pt alert and oriented, however has times of confusion and is easily redirected. Pt has been calm and cooperative during this shift.   A: Scheduled medications administered to pt, per MD orders. Support and encouragement provided. Frequent verbal contact made.  R: No adverse drug reactions noted. Pt complaint with medications and treatment plan. Pt interacts well with staff on the unit. Pt is stable at this time, will continue to monitor and provide care for as ordered.  This Probation officer as well as others have attempted to contact dialysis multiple time, awaiting for them to contact this Probation officer back. This Probation officer has even contacted 2C in attempt to locate dialysis for evening treatment.

## 2019-04-15 NOTE — TOC Benefit Eligibility Note (Signed)
Transition of Care [facility]) Benefit Eligibility Note    Patient Details  Name: Ruben Hudson MRN: [id number] Date of Birth: 11/18/1925   Medication/Dose: LOVENOX  56 MG DAILY X14 DAYS SYRINGES  Covered?: Yes  Tier: (NO TIER)  Prescription Coverage Preferred Pharmacy: [facility]  Spoke with Person/Company/Phone Number:: VICKY  @ [facility] # [phone] OPT- MEMBER  Co-Pay: $205.45  Prior Approval: No  Deductible: Met  Additional Notes: ENOXAPARIN 40 MG DAILY X 14 DAYS, COVER- YES, CO-PAY- $ 7.00, TIER- 1 DRUG, P/AGypsy [place] Phone Number: 04/15/2019, 2:01 PM

## 2019-04-15 NOTE — Progress Notes (Signed)
Patient did fairly well today with physical therapy.  With regard to going home or to rehab I discussed this with his wife and him today.  He would need vascular access for hemodialysis if he is going to go to rehab.  His wife takes care of his peritoneal dialysis at home.  He only has 3 steps to get in to his home and lives on one level.  He for he and his wife feel he would be safe there.  So I think it would be reasonable for him to be homebound for about 6 weeks with home health therapy working with him.  Discharge when medically stable.

## 2019-04-15 NOTE — Discharge Instructions (Signed)
INSTRUCTIONS AFTER Surgery  o Remove items at home which could result in a fall. This includes throw rugs or furniture in walking pathways o ICE to the affected joint every three hours while awake for 30 minutes at a time, for at least the first 3-5 days, and then as needed for pain and swelling.  Continue to use ice for pain and swelling. You may notice swelling that will progress down to the foot and ankle.  This is normal after surgery.  Elevate your leg when you are not up walking on it.   o Continue to use the breathing machine you got in the hospital (incentive spirometer) which will help keep your temperature down.  It is common for your temperature to cycle up and down following surgery, especially at night when you are not up moving around and exerting yourself.  The breathing machine keeps your lungs expanded and your temperature down.   DIET:  As you were doing prior to hospitalization, we recommend a well-balanced diet.  DRESSING / WOUND CARE / SHOWERING  Dressing change as needed.  No showering.  The staples will be removed in 2 weeks at [place]  o Increase activity slowly as tolerated, but follow the weight bearing instructions below.   o No driving for 6 weeks or until further direction given by your physician.  You cannot drive while taking narcotics.  o No lifting or carrying greater than 10 lbs. until further directed by your surgeon. o Avoid periods of inactivity such as sitting longer than an hour when not asleep. This helps prevent blood clots.  o You may return to work once you are authorized by your doctor.     WEIGHT BEARING  Weightbearing as tolerated on the left   EXERCISES Ambulation and gait training with a walker.  CONSTIPATION  Constipation is defined medically as fewer than three stools per week and severe constipation as less than one stool per week.  Even if you have a regular bowel pattern at home, your normal regimen is likely to be  disrupted due to multiple reasons following surgery.  Combination of anesthesia, postoperative narcotics, change in appetite and fluid intake all can affect your bowels.   YOU MUST use at least one of the following options; they are listed in order of increasing strength to get the job done.  They are all available over the counter, and you may need to use some, POSSIBLY even all of these options:    Drink plenty of fluids (prune juice may be helpful) and high fiber foods Colace 100 mg by mouth twice a day  Senokot for constipation as directed and as needed Dulcolax (bisacodyl), take with full glass of water  Miralax (polyethylene glycol) once or twice a day as needed.  If you have tried all these things and are unable to have a bowel movement in the first 3-4 days after surgery call either your surgeon or your primary doctor.    If you experience loose stools or diarrhea, hold the medications until you stool forms back up.  If your symptoms do not get better within 1 week or if they get worse, check with your doctor.  If you experience "the worst abdominal pain ever" or develop nausea or vomiting, please contact the office immediately for further recommendations for treatment.   ITCHING:  If you experience itching with your medications, try taking only a single pain pill, or even half a pain pill at a time.  You can also use Benadryl over the counter for itching or also to help with sleep.   TED HOSE STOCKINGS:  Use stockings on both legs until for at least 2 weeks or as directed by physician office. They may be removed at night for sleeping.  MEDICATIONS:  See your medication summary on the After Visit Summary that nursing will review with you.  You may have some home medications which will be placed on hold until you complete the course of blood thinner medication.  It is important for you to complete the blood thinner medication as prescribed.  PRECAUTIONS:  If you experience chest pain or  shortness of breath - call 911 immediately for transfer to the hospital emergency department.   If you develop a fever greater that 101 F, purulent drainage from wound, increased redness or drainage from wound, foul odor from the wound/dressing, or calf pain - CONTACT YOUR SURGEON.                                                   FOLLOW-UP APPOINTMENTS:  If you do not already have a post-op appointment, please call the office for an appointment to be seen by your surgeon.  Guidelines for how soon to be seen are listed in your After Visit Summary, but are typically between 1-4 weeks after surgery.  OTHER INSTRUCTIONS:     MAKE SURE YOU:   Understand these instructions.   Get help right away if you are not doing well or get worse.    Thank you for letting us be a part of your medical care team.  It is a privilege we respect greatly.  We hope these instructions will help you stay on track for a fast and full recovery!

## 2019-04-15 NOTE — Evaluation (Signed)
Occupational Therapy Evaluation Patient Details Name: Ruben Hudson MRN: [id number] DOB: 1925-10-31 Today's Date: 04/15/2019    History of Present Illness Pt is a 85 y.o. male presenting to hospital 04/14/19 s/p fall opening storm door with L hip pain.  Imaging showing nondisplaced L femoral neck fx and s/p L cannulated hip pinning 04/14/19.  PMH includes ESRD on nightly peritoneal dialysis, cardiomyopathy with reduced EF, home O2, CVA, R eye with limited peripheral vision, CKD stage 3, CHF, dysrhythmia, MI, a-fib wit RVR, h/o R ankle fx surgery, 2 L home O2 chronic.   Clinical Impression   Mr. Ficke was seen for OT evaluation this date, POD#1 from above surgery. Per pt and wife who was present at bedside at start of session, pt was active and independent in all ADLs prior to surgery. He reports mowing his lawn on the day of his fall. Pt is eager to return to PLOF with less pain and improved safety and independence. Pt currently requires moderate assist for LB dressing and bathing while in seated position due to pain and limited AROM of L hip. Pt instructed in safe transfer strategies as well as role of OT in the acute care setting. Would benefit from additional education in self care skills, falls prevention strategies, home/routines modifications, DME/AE for LB bathing and dressing tasks, and compression stocking mgt strategies. OT provided handout to support recall and carryover of education at time of evaluation. Upon acute care DC, recommend STR to maximize pt safety and return to PLOF.      Follow Up Recommendations  SNF(If pt/wife decline STR will need HHOT)    Equipment Recommendations  None recommended by OT(Pt has necessary equipment.)    Recommendations for Other Services       Precautions / Restrictions Precautions Precautions: Fall Precaution Comments: peritoneal dialysis; R subclavian HD catheter Restrictions Weight Bearing Restrictions: Yes LLE Weight Bearing: Weight  bearing as tolerated      Mobility Bed Mobility Overal bed mobility: Needs Assistance Bed Mobility: Supine to Sit     Supine to sit: Mod assist;Max assist;HOB elevated     General bed mobility comments: Deferred (pt up in chair at beginning/end of session)  Transfers Overall transfer level: Needs assistance Equipment used: Rolling walker (2 wheeled) Transfers: Sit to/from Stand Sit to Stand: Min assist;Mod assist         General transfer comment: assist to initiate and come to full stand; vc's for UE/LE placement and safe use of RW this date.    Balance Overall balance assessment: Needs assistance Sitting-balance support: No upper extremity supported;Feet supported Sitting balance-Leahy Scale: Good Sitting balance - Comments: steady sitting reaching within BOS   Standing balance support: Bilateral upper extremity supported Standing balance-Leahy Scale: Poor Standing balance comment: pt requiring B UE support on RW for static standing balance                           ADL either performed or assessed with clinical judgement   ADL Overall ADL's : Needs assistance/impaired                                       General ADL Comments: Pt requires moderate assist for LB ADL management 2/2 increased pain and decreased ROM in the his L hip. Pt requires min-moderate assist for functional mobility with consistent cueing for safety and sequencing  using his RW.     Vision Baseline Vision/History: Wears glasses Wears Glasses: At all times Patient Visual Report: No change from baseline       Perception     Praxis      Pertinent Vitals/Pain Pain Assessment: 0-10 Pain Score: 7  Faces Pain Scale: Hurts little more Pain Location: L hip/thigh Pain Descriptors / Indicators: Guarding;Constant;Discomfort;Aching;Tender Pain Intervention(s): Limited activity within patient's tolerance;Monitored during session;Repositioned;Utilized relaxation techniques      Hand Dominance Right   Extremity/Trunk Assessment Upper Extremity Assessment Upper Extremity Assessment: Overall WFL for tasks assessed(Grip and FMC WNL. BUE ROM WfL t/o.)   Lower Extremity Assessment Lower Extremity Assessment: Defer to PT evaluation;LLE deficits/detail RLE Deficits / Details: strength and ROM WFL LLE Deficits / Details: s/p L hip fx with cannulated pinning. LLE: Unable to fully assess due to pain LLE Coordination: decreased gross motor;decreased fine motor   Cervical / Trunk Assessment Cervical / Trunk Assessment: Normal   Communication Communication Communication: No difficulties   Cognition Arousal/Alertness: Awake/alert Behavior During Therapy: Impulsive Overall Cognitive Status: History of cognitive impairments - at baseline                                 General Comments: Oriented to person and place and situation; generalized confusion noted   General Comments  minimal drainage ntoed L thigh dressing    Exercises Total Joint Exercises Ankle Circles/Pumps: AROM;Strengthening;Both;10 reps;Supine Quad Sets: AROM;Strengthening;Both;10 reps;Supine Short Arc Quad: AAROM;Strengthening;Left;10 reps;Supine Heel Slides: AAROM;Strengthening;Left;10 reps;Supine Hip ABduction/ADduction: AAROM;Strengthening;Left;10 reps;Supine   Shoulder Instructions      Home Living Family/patient expects to be discharged to:: Private residence Living Arrangements: Spouse/significant other Available Help at Discharge: Family Type of Home: House Home Access: Stairs to enter Technical brewer of Steps: 3 Entrance Stairs-Rails: Right(Wall on L) Home Layout: One level(Has basement but does not need to go down to it.)     Bathroom Shower/Tub: Occupational psychologist: Standard     Home Equipment: Environmental consultant - 4 wheels;Other (comment);Bedside commode;Shower seat - built in;Grab bars - toilet;Grab bars - tub/shower          Prior  Functioning/Environment Level of Independence: Independent with assistive device(s)        Comments: 2 L O2 at baseline.  Uses 4ww in home and walking cane outside.  Peritoneal dialysis at night.        OT Problem List: Decreased strength;Decreased coordination;Pain;Decreased range of motion;Decreased activity tolerance;Decreased safety awareness;Impaired balance (sitting and/or standing);Decreased knowledge of use of DME or AE;Decreased knowledge of precautions      OT Treatment/Interventions: Self-care/ADL training;Balance training;Therapeutic exercise;Therapeutic activities;Energy conservation;DME and/or AE instruction;Patient/family education    OT Goals(Current goals can be found in the care plan section) Acute Rehab OT Goals Patient Stated Goal: to improve mobility and have less pain OT Goal Formulation: With patient Time For Goal Achievement: 04/29/19 Potential to Achieve Goals: Good ADL Goals Pt Will Perform Lower Body Dressing: with adaptive equipment;with caregiver independent in assisting;sit to/from stand;with min assist(With LRAD PRN for improved safety and functional independence upon hospital DC.) Pt Will Transfer to Toilet: with min guard assist;with supervision;with set-up;ambulating;bedside commode(With LRAD PRN for improved safety and functional independence upon hospital DC.) Pt Will Perform Toileting - Clothing Manipulation and hygiene: sit to/from stand;with min guard assist;with supervision;with set-up;with caregiver independent in assisting(With LRAD PRN for improved safety and functional independence upon hospital DC.) Additional ADL Goal #1: Pt and  Caregiver will independently verbalize a plan to implement at least 3 learned falls prevention strategies into their daily routines/home environment for improved safety and functional independence of the pt upon hospital DC.  OT Frequency: Min 1X/week   Barriers to D/C: Inaccessible home environment           Co-evaluation PT/OT/SLP Co-Evaluation/Treatment: Yes Reason for Co-Treatment: Necessary to address cognition/behavior during functional activity;To address functional/ADL transfers PT goals addressed during session: Mobility/safety with mobility;Balance;Strengthening/ROM OT goals addressed during session: Proper use of Adaptive equipment and DME;ADL's and self-care      AM-PAC OT "6 Clicks" Daily Activity     Outcome Measure Help from another person eating meals?: None Help from another person taking care of personal grooming?: A Little Help from another person toileting, which includes using toliet, bedpan, or urinal?: A Lot Help from another person bathing (including washing, rinsing, drying)?: A Lot Help from another person to put on and taking off regular upper body clothing?: A Little Help from another person to put on and taking off regular lower body clothing?: A Lot 6 Click Score: 16   End of Session Equipment Utilized During Treatment: Gait belt;Rolling walker  Activity Tolerance: Patient tolerated treatment well Patient left: in chair;with chair alarm set;Other (comment)(With PT in room to finish session.)  OT Visit Diagnosis: Other abnormalities of gait and mobility (R26.89);Pain Pain - Right/Left: Left Pain - part of body: Hip                Time: [id number] OT Time Calculation (min): 20 min Charges:  OT General Charges $OT Visit: 1 Visit OT Evaluation $OT Eval Moderate Complexity: 1 Mod  Shara Blazing, M.S., OTR/L Ascom: [phone] 04/15/19, 4:11 PM

## 2019-04-15 NOTE — Progress Notes (Signed)
Physical Therapy Treatment Patient Details Name: Ruben Hudson MRN: [id number] DOB: 1925/12/24 Today's Date: 04/15/2019    History of Present Illness Pt is a 85 y.o. male presenting to hospital 04/14/19 s/p fall opening storm door with L hip pain.  Imaging showing nondisplaced L femoral neck fx and s/p L cannulated hip pinning 04/14/19.  PMH includes ESRD on nightly peritoneal dialysis, cardiomyopathy with reduced EF, home O2, CVA, R eye with limited peripheral vision, CKD stage 3, CHF, dysrhythmia, MI, a-fib wit RVR, h/o R ankle fx surgery, 2 L home O2 chronic.    PT Comments    PT/OT co-session.  Pt sitting in recliner upon therapy arrival; pt's wife present beginning and end of session.  Pt able to progress to ambulating 20 feet with RW with 2 assist.  Improved gait pattern noted with vc's (see below for details).  Limited distance ambulating d/t L hip/thigh pain.  HR and O2 sats (on 2 L O2 via nasal cannula) WFL during sessions activities.  Will continue to focus on strengthening and progressive functional mobility per pt tolerance.   Follow Up Recommendations  SNF     Equipment Recommendations  Rolling walker with 5" wheels;3in1 (PT)    Recommendations for Other Services OT consult     Precautions / Restrictions Precautions Precautions: Fall Precaution Comments: peritoneal dialysis; R subclavian HD catheter Restrictions Weight Bearing Restrictions: Yes LLE Weight Bearing: Weight bearing as tolerated    Mobility  Bed Mobility          Transfers Overall transfer level: Needs assistance Equipment used: Rolling walker (2 wheeled) Transfers: Sit to/from Stand Sit to Stand: Min assist;Mod assist         General transfer comment: assist to initiate and come to full stand; vc's for UE/LE placement; increased effort to stand  Ambulation/Gait Ambulation/Gait assistance: Min guard;Min assist;+2 physical assistance;+2 safety/equipment; chair follow Gait Distance (Feet):  20 Feet Assistive device: Rolling walker (2 wheeled)   Gait velocity: decreased   General Gait Details: antalgic; decreased stance time L LE; L LE knee initially buckling when attempting to take step with R LE but with vc's for increasing UE support through RW and keeping walker closer pt able to offweight L LE when advancing R LE and no further buckling noted   Stairs             Wheelchair Mobility    Modified Rankin (Stroke Patients Only)       Balance Overall balance assessment: Needs assistance Sitting-balance support: No upper extremity supported;Feet supported Sitting balance-Leahy Scale: Good Sitting balance - Comments: steady sitting reaching within BOS   Standing balance support: Bilateral upper extremity supported Standing balance-Leahy Scale: Poor Standing balance comment: pt requiring B UE support on RW for static standing balance                            Cognition Arousal/Alertness: Awake/alert Behavior During Therapy: Impulsive                                   General Comments: Oriented to person and place and situation; generalized confusion noted      Exercises    General Comments General comments (skin integrity, edema, etc.): minimal drainage ntoed L thigh dressing.  Nursing cleared pt for participation in physical therapy.  Pt agreeable to PT session.      Pertinent Vitals/Pain  Pain Assessment: Faces Pain Score: 9  Faces Pain Scale: Hurts little more Pain Location: L hip/thigh Pain Descriptors / Indicators: Guarding;Constant;Discomfort;Aching;Tender Pain Intervention(s): Limited activity within patient's tolerance;Monitored during session;Repositioned    Home Living Family/patient expects to be discharged to:: Private residence Living Arrangements: Spouse/significant other Available Help at Discharge: Family Type of Home: House Home Access: Stairs to enter Entrance Stairs-Rails: Right(wall on the L) Home  Layout: One level(does not have to go to the basement) Home Equipment: Walker - 4 wheels;Other (comment);Bedside commode;Shower seat - built in;Grab bars - toilet;Grab bars - tub/shower(walking stick)      Prior Function Level of Independence: Independent with assistive device(s)      Comments: 2 L O2 at baseline.  Uses 4ww in home and walking cane outside.  Peritoneal dialysis at night.   PT Goals (current goals can now be found in the care plan section) Acute Rehab PT Goals Patient Stated Goal: to improve mobility and have less pain PT Goal Formulation: With patient/family Time For Goal Achievement: 04/29/19 Potential to Achieve Goals: Good Progress towards PT goals: Progressing toward goals    Frequency    BID      PT Plan Current plan remains appropriate    Co-evaluation              AM-PAC PT "6 Clicks" Mobility   Outcome Measure  Help needed turning from your back to your side while in a flat bed without using bedrails?: A Lot Help needed moving from lying on your back to sitting on the side of a flat bed without using bedrails?: A Lot Help needed moving to and from a bed to a chair (including a wheelchair)?: A Lot Help needed standing up from a chair using your arms (e.g., wheelchair or bedside chair)?: A Lot Help needed to walk in hospital room?: A Lot Help needed climbing 3-5 steps with a railing? : Total 6 Click Score: 11    End of Session Equipment Utilized During Treatment: Gait belt Activity Tolerance: Patient limited by pain Patient left: in chair;with call bell/phone within reach;with chair alarm set;with family/visitor present;Other (comment)(B heels floating on pillows) Nurse Communication: Mobility status;Precautions;Weight bearing status PT Visit Diagnosis: Other abnormalities of gait and mobility (R26.89);Muscle weakness (generalized) (M62.81);History of falling (Z91.81);Difficulty in walking, not elsewhere classified (R26.2);Pain Pain -  Right/Left: Left Pain - part of body: Hip     Time: [id number] PT Time Calculation (min) (ACUTE ONLY): 33 min  Charges:  $Gait Training: 8-22 mins $Therapeutic Activity: 8-22 mins                    Leitha Bleak, PT 04/15/19, 3:47 PM [phone]

## 2019-04-15 NOTE — Progress Notes (Signed)
PT Cancellation Note  Patient Details Name: Ruben Hudson MRN: [id number] DOB: 21-May-1926   Cancelled Treatment:    Reason Eval/Treat Not Completed: Other (comment).  PT consult received.  Chart reviewed.  Pt still hooked up to peritoneal dialysis upon PT arrival (nurse reports she has been trying to contact appropriate person to address this).  Will re-attempt PT evaluation later today once peritoneal dialysis is addressed.  Leitha Bleak, PT 04/15/19, 10:49 AM [phone]

## 2019-04-15 NOTE — Progress Notes (Signed)
[facility], [place] 04/15/19  Subjective:   LOS: 1 11/18 0701 - 11/19 0700 In: 821.5 [I.V.:721.5; IV Piggyback:100] Out: 425 [Urine:375; Blood:50]  Patient known to our practice from outpatient Presents after falling at home Has had intermittent.  The confusion from pain medications   Objective:  Vital signs in last 24 hours:  Temp:  [97.6 F (36.4 C)-98.3 F (36.8 C)] 97.7 F (36.5 C) (11/19 1652) Pulse Rate:  [60-87] 60 (11/19 1652) Resp:  [16-18] 18 (11/19 1652) BP: (103-134)/(51-77) 103/59 (11/19 1652) SpO2:  [91 %-100 %] 100 % (11/19 1652)  Weight change: 0 kg Filed Weights   04/14/19 0034 04/14/19 1152  Weight: 59.9 kg 59.9 kg    Intake/Output:    Intake/Output Summary (Last 24 hours) at 04/15/2019 1732 Last data filed at 04/15/2019 1406 Gross per 24 hour  Intake 1001.51 ml  Output 575 ml  Net 426.51 ml     Physical Exam: General:  No acute distress, sitting up in the chair  HEENT  anicteric, moist oral mucous membranes  Pulm/lungs  normal breathing effort, [place] O2 continuous  CVS/Heart  no rub  Abdomen:   Soft, nontender  Extremities:  Trace edema  Neurologic:  Alert, able to answer questions  Skin:  No acute rashes  Access:  PD catheter       Basic Metabolic Panel:  Recent Labs  Lab 04/14/19 0223 04/15/19 0506  NA 142 142  K 4.3 4.1  CL 103 106  CO2 28 25  GLUCOSE 136* 160*  BUN 41* 35*  CREATININE 3.53* 2.92*  CALCIUM 8.6* 8.3*     CBC: Recent Labs  Lab 04/14/19 0223 04/15/19 0506  WBC 15.3* 10.3  NEUTROABS 14.0*  --   HGB 13.8 11.8*  HCT 42.5 37.5*  MCV 98.8 100.3*  PLT 186 162      Lab Results  Component Value Date   HEPBSAG Negative 03/17/2017      Microbiology:  Recent Results (from the past 240 hour(s))  SARS CORONAVIRUS 2 (TAT 6-24 HRS) Nasopharyngeal Nasopharyngeal Swab     Status: None   Collection Time: 04/14/19  3:35 AM   Specimen: Nasopharyngeal Swab  Result Value Ref  Range Status   SARS Coronavirus 2 NEGATIVE NEGATIVE Final    Comment: (NOTE) SARS-CoV-2 target nucleic acids are NOT DETECTED. The SARS-CoV-2 RNA is generally detectable in upper and lower respiratory specimens during the acute phase of infection. Negative results do not preclude SARS-CoV-2 infection, do not rule out co-infections with other pathogens, and should not be used as the sole basis for treatment or other patient management decisions. Negative results must be combined with clinical observations, patient history, and epidemiological information. The expected result is Negative. Fact Sheet for Patients: SugarRoll.be Fact Sheet for Healthcare Providers: https://www.woods-mathews.com/ This test is not yet approved or cleared by the [place] FDA and  has been authorized for detection and/or diagnosis of SARS-CoV-2 by FDA under an Emergency Use Authorization (EUA). This EUA will remain  in effect (meaning this test can be used) for the duration of the COVID-19 declaration under Section 56 4(b)(1) of the Act, 21 U.S.C. section 360bbb-3(b)(1), unless the authorization is terminated or revoked sooner. Performed at [facility], [place] [address] [place],  [address]     Coagulation Studies: No results for input(s): LABPROT, INR in the last 72 hours.  Urinalysis: No results for input(s): COLORURINE, LABSPEC, PHURINE, GLUCOSEU, HGBUR, BILIRUBINUR, KETONESUR, PROTEINUR, UROBILINOGEN, NITRITE, LEUKOCYTESUR in the last  72 hours.  Invalid input(s): APPERANCEUR    Imaging: Ct Hip Left Wo Contrast  Result Date: 04/14/2019 CLINICAL DATA:  Fall, left hip pain EXAM: CT OF THE LEFT HIP WITHOUT CONTRAST TECHNIQUE: Multidetector CT imaging of the left hip was performed according to the standard protocol. Multiplanar CT image reconstructions were also generated. COMPARISON:  Plain films earlier today FINDINGS: There is a left femoral  neck fracture as seen on plain films. No significant displacement. No angulation. No subluxation or dislocation. IMPRESSION: Nondisplaced left femoral neck fracture. Electronically Signed   By: Rolm Baptise M.D.   On: 04/14/2019 02:17   Dg Chest Portable 1 View  Result Date: 04/14/2019 CLINICAL DATA:  Fall, preop. EXAM: PORTABLE CHEST 1 VIEW COMPARISON:  Chest radiograph 07/20/2018 FINDINGS: Unchanged cardiomegaly. Unchanged mediastinal contours with aortic atherosclerosis. Bilateral pleural effusions, not significantly changed from prior exam. Progressive vascular congestion, suggestion of central pulmonary edema, new. No pneumothorax. No acute osseous abnormalities are seen. IMPRESSION: 1. Chronic cardiomegaly and bilateral pleural effusions, similar to February 2020 radiograph. 2. Vascular congestion has progressed, suggestion of mild superimposed pulmonary edema is new. Aortic Atherosclerosis (ICD10-I70.0). Electronically Signed   By: Keith Rake M.D.   On: 04/14/2019 01:28   Dg Hip Operative Unilat W Or W/o Pelvis Left  Result Date: 04/14/2019 CLINICAL DATA:  Intraoperative left hip fluoroscopy EXAM: OPERATIVE LEFT HIP WITH PELVIS COMPARISON:  CT hip 04/14/2019, radiographs 04/14/2019 FINDINGS: Sequential intraoperative fluoroscopic views demonstrate placement of 3 partially threaded, cannulated screws transfixing the subcapital left femoral neck fracture seen on comparison radiographs. No evidence of acute hardware complication. Alignment is near anatomic on these nonstandard views. Soft tissues poorly evaluated in the limited fluoroscopic collimation. IMPRESSION: Intraoperative fluoroscopic views demonstrating 3 partially threaded, cannulated screws transfixing the subcapital left femoral neck fracture. No evidence of acute hardware complication. Electronically Signed   By: Lovena Le M.D.   On: 04/14/2019 13:51   Dg Hip Unilat With Pelvis 2-3 Views Left  Result Date: 04/14/2019 CLINICAL  DATA:  Fall, left hip pain EXAM: DG HIP (WITH OR WITHOUT PELVIS) 2-3V LEFT COMPARISON:  None. FINDINGS: There is cortical irregularity noted along the left lateral femoral neck. Lucency noted in the region of the femoral neck. Findings are concerning for subtle left femoral neck fracture. This could be confirmed with CT. No subluxation or dislocation. IMPRESSION: Findings concerning for subtle left femoral neck fracture. This could be confirmed with CT. Electronically Signed   By: Rolm Baptise M.D.   On: 04/14/2019 01:27     Medications:   . dialysis solution 1.5% low-MG/low-CA     . acetaminophen  1,000 mg Oral TID  . amiodarone  200 mg Oral Daily  . aspirin EC  81 mg Oral Daily  . Chlorhexidine Gluconate Cloth  6 each Topical Q0600  . docusate sodium  100 mg Oral BID  . feeding supplement (NEPRO CARB STEADY)  237 mL Oral BID BM  . gentamicin cream  1 application Topical Daily  . heparin injection (subcutaneous)  5,000 Units Subcutaneous Q8H  . levothyroxine  50 mcg Oral Daily  . midodrine  10 mg Oral TID  . multivitamin  1 tablet Oral Daily  . tamsulosin  0.4 mg Oral QPM   alum & mag hydroxide-simeth, bisacodyl, heparin, HYDROmorphone (DILAUDID) injection, magnesium citrate, magnesium hydroxide, menthol-cetylpyridinium **OR** phenol, metoCLOPramide **OR** metoCLOPramide (REGLAN) injection, ondansetron **OR** ondansetron (ZOFRAN) IV, oxyCODONE  Assessment/ Plan:  85 y.o. male with end-stage renal disease on peritoneal dialysis, atrial fibrillation, history  of GI bleed, cardiomyopathy EF 20% 2019, hypothyroidism, history of stroke presents to hospital with left hip fracture after a fall-status post pinning April 14, 2019 Dr. Rudene Christians.  Principal Problem:   Closed left hip fracture, initial encounter [facility]) Active Problems:   Congestive heart failure (HCC)   ESRD on dialysis [facility])   Atrial fibrillation, chronic (HCC)   #.  End-stage renal disease on peritoneal dialysis Target Wt 62  Kg Total Fill Vol 24 5700 ml PD Type PD with Cycler Total Fills 3 Fills/Cycle @ 1900 ml We will continue prescription of 2000 mL for 3 cycles.  Using 1.5% bags   #. Anemia of CKD  Lab Results  Component Value Date   HGB 11.8 (L) 04/15/2019  Continue to monitor.  No indication for Epogen  #. [facility]     Component Value Date/Time   PTH 56 12/03/2017 1443   Lab Results  Component Value Date   PHOS 2.6 12/05/2017  Monitor phosphorus.  Not on binders at home. Nepro feeding supplements  #.  Left hip fracture Status post pinning November 18 If patient goes to SNF, they will not be able to do peritoneal dialysis therefore he will need to transition to hemodialysis and will need a PermCath for that Discharge planning currently ongoing for home with home health   LOS: [place] 11/19/20205:32 PM  [place], [place]

## 2019-04-15 NOTE — Progress Notes (Signed)
   04/15/19 2118  Neurological  Level of Consciousness Alert  Orientation Level Oriented to person;Oriented to situation;Disoriented to time;Disoriented to place  Respiratory  Respiratory Pattern Regular  Bilateral Breath Sounds Clear;Diminished  Cough Productive  Cardiac  Pulse Regular  Heart Sounds S1, S2  Peritoneal Catheter Left lower abdomen  No Placement Date or Time found.   Catheter Location: Left lower abdomen  Site Assessment Clean;Dry;Intact  Drainage Description None  Catheter status Accessed;Draining;Patent  Dressing Gauze/Drain sponge  Dressing Status Clean;Dry;Intact  Dressing Intervention Dressing changed  Psychosocial  Psychosocial (WDL) X  Patient Behaviors Irritable  PT STABLE FOR PD TX WIFE AT BEDSIDE, PD CATHETER WDL TX INITIATED AS ORDERED.

## 2019-04-15 NOTE — Progress Notes (Signed)
PROGRESS NOTE    Ruben Hudson  [id number] DOB: 1926-02-28 DOA: 04/14/2019 PCP: Kirk Ruths, MD      Brief Narrative:  Ruben Hudson is a 85 y.o. male with history of A. fib not on anticoagulation secondary to GI bleed, cardiomyopathy last EF measured was 20% in 2019, ESRD on peritoneal dialysis for last 2 years, hypothyroidism, history of stroke with partial vision loss in the right eye on aspirin had a fall at home when he was trying to open the storm door.  Had a mechanical fall after blowing leaves in yard.  Had immediate hip pain.  Did not lose consciousness. Denied any chest pain or shortness of breath.  In the ER CT left hip showed left hip fracture.  On-call orthopedic surgeon and nephrologist were consulted.  Labs revealed WBC of 15.3 potassium 4.3 bicarb 28.  EKG showed old LBBB.         Assessment & Plan:  Acute left hip fracture S/p cannulated pinning of the left hip by Dr. Rudene Christians -Consult orthopedics, appreciate recommendations -Scheduled Tylenol for pain control -Oxycodone as needed   Delirium This is mild, sundowning, the patient was disoriented at times overnight.  He appears improved this morning. Delirium precautions:   -Lights and TV off, minimize interruptions at night  -Blinds open and lights on during day  -Glasses/hearing aid with patient  -Frequent reorientation  -PT/OT when able  -Avoid sedation medications/Beers list medications   Atrial fibrillation, chronic Not on anticoagulation due to GI bleed.  Rates controlled here  -Continue amiodarone  ESRD on PD -Consult nephrology to manage his routine dialysis -Hold torsemide  Chronic systolic CHF LBBB EF [id number] in 2019.  Appears euvolemic. -Hold torsemide for now  Hypothyroidism -Continue levothyroxine  History of stroke -Resume aspirin  Prolonged QT interval ruled out LBBB corrected QTc is 450.  Recurrent syncope -Continue midodrine  Moderate protein calorie  malnutrition -Continue Nepro        DVT prophylaxis: Heparin Code Status: Full code Family Communication: Wife MDM and disposition Plan:  The below labs and imaging reports reviewed and summarized above.  Medication management as above.   The patient was admitted with hip fracture.  He underwent uncomplicated hip fracture repair 11/18 and we will plan for PT evalaution and SNF placement      Objective: Vitals:   04/15/19 0157 04/15/19 0438 04/15/19 0729 04/15/19 0948  BP: 127/63 131/66 122/77 107/67  Pulse: 73 73 72 72  Resp: 17 16 17    Temp: 98.3 F (36.8 C) 98.2 F (36.8 C) 98.1 F (36.7 C)   TempSrc: Oral Oral Oral   SpO2: 98% 95% 100% 97%  Weight:      Height:        Intake/Output Summary (Last 24 hours) at 04/15/2019 1211 Last data filed at 04/15/2019 0400 Gross per 24 hour  Intake 821.51 ml  Output 425 ml  Net 396.51 ml   Filed Weights   04/14/19 0034 04/14/19 1152  Weight: 59.9 kg 59.9 kg    Examination: General appearance: Elderly adult male, alert and in no acute distress.   HEENT: Anicteric, conjunctiva pink, lids and lashes normal. No nasal deformity, discharge, epistaxis.  Lips moist, teeth normal. OP moist, no oral lesions.   Skin: Warm and dry.  No suspicious rashes or lesions. Cardiac: RRR, no murmurs appreciated.  JVP normal, no LE edema.    Respiratory: Normal respiratory rate and rhythm.  CTAB without rales or wheezes. Abdomen: Abdomen soft.  No  tenderness palpation or guarding. No ascites, distension, hepatosplenomegaly.   MSK: No deformities or effusions of the large joints of the upper or lower extremities bilaterally.  Diffuse moderate loss of subcutaneous muscle mass and fat. Neuro: Awake and alert. Naming is grossly intact, and the patient's recall, recent and remote, as well as general fund of knowledge seem within normal limits.  Muscle tone normal, without fasciculations.  Moves all extremities equally and with normal coordination.  Marland Kitchen  Speech fluent.    Psych: Sensorium intact and responding to questions, attention normal. Affect normal.  Judgment and insight appear mildly impaired by dementia.      Data Reviewed: I have personally reviewed following labs and imaging studies:  CBC: Recent Labs  Lab 04/14/19 0223 04/15/19 0506  WBC 15.3* 10.3  NEUTROABS 14.0*  --   HGB 13.8 11.8*  HCT 42.5 37.5*  MCV 98.8 100.3*  PLT 186 0000000   Basic Metabolic Panel: Recent Labs  Lab 04/14/19 0223 04/15/19 0506  NA 142 142  K 4.3 4.1  CL 103 106  CO2 28 25  GLUCOSE 136* 160*  BUN 41* 35*  CREATININE 3.53* 2.92*  CALCIUM 8.6* 8.3*   GFR: Estimated Creatinine Clearance: 14 mL/min (A) (by C-G formula based on SCr of 2.92 mg/dL (H)). Liver Function Tests: No results for input(s): AST, ALT, ALKPHOS, BILITOT, PROT, ALBUMIN in the last 168 hours. No results for input(s): LIPASE, AMYLASE in the last 168 hours. No results for input(s): AMMONIA in the last 168 hours. Coagulation Profile: No results for input(s): INR, PROTIME in the last 168 hours. Cardiac Enzymes: No results for input(s): CKTOTAL, CKMB, CKMBINDEX, TROPONINI in the last 168 hours. BNP (last 3 results) No results for input(s): PROBNP in the last 8760 hours. HbA1C: No results for input(s): HGBA1C in the last 72 hours. CBG: No results for input(s): GLUCAP in the last 168 hours. Lipid Profile: No results for input(s): CHOL, HDL, LDLCALC, TRIG, CHOLHDL, LDLDIRECT in the last 72 hours. Thyroid Function Tests: No results for input(s): TSH, T4TOTAL, FREET4, T3FREE, THYROIDAB in the last 72 hours. Anemia Panel: No results for input(s): VITAMINB12, FOLATE, FERRITIN, TIBC, IRON, RETICCTPCT in the last 72 hours. Urine analysis:    Component Value Date/Time   COLORURINE AMBER (A) 03/09/2017 1654   APPEARANCEUR HAZY (A) 03/09/2017 1654   APPEARANCEUR Clear 11/22/2015 1514   LABSPEC 1.014 03/09/2017 1654   LABSPEC 1.008 05/28/2012 1120   PHURINE 5.0 03/09/2017  1654   GLUCOSEU NEGATIVE 03/09/2017 1654   GLUCOSEU Negative 05/28/2012 1120   HGBUR SMALL (A) 03/09/2017 1654   BILIRUBINUR NEGATIVE 03/09/2017 1654   BILIRUBINUR Negative 11/22/2015 1514   BILIRUBINUR Negative 05/28/2012 1120   KETONESUR NEGATIVE 03/09/2017 1654   PROTEINUR 30 (A) 03/09/2017 1654   NITRITE NEGATIVE 03/09/2017 1654   LEUKOCYTESUR NEGATIVE 03/09/2017 1654   LEUKOCYTESUR Negative 11/22/2015 1514   LEUKOCYTESUR Negative 05/28/2012 1120   Sepsis Labs: @LABRCNTIP (procalcitonin:4,lacticacidven:4)  ) Recent Results (from the past 240 hour(s))  SARS CORONAVIRUS 2 (TAT 6-24 HRS) Nasopharyngeal Nasopharyngeal Swab     Status: None   Collection Time: 04/14/19  3:35 AM   Specimen: Nasopharyngeal Swab  Result Value Ref Range Status   SARS Coronavirus 2 NEGATIVE NEGATIVE Final    Comment: (NOTE) SARS-CoV-2 target nucleic acids are NOT DETECTED. The SARS-CoV-2 RNA is generally detectable in upper and lower respiratory specimens during the acute phase of infection. Negative results do not preclude SARS-CoV-2 infection, do not rule out co-infections with other pathogens, and should  not be used as the sole basis for treatment or other patient management decisions. Negative results must be combined with clinical observations, patient history, and epidemiological information. The expected result is Negative. Fact Sheet for Patients: SugarRoll.be Fact Sheet for Healthcare Providers: https://www.woods-mathews.com/ This test is not yet approved or cleared by the [place] FDA and  has been authorized for detection and/or diagnosis of SARS-CoV-2 by FDA under an Emergency Use Authorization (EUA). This EUA will remain  in effect (meaning this test can be used) for the duration of the COVID-19 declaration under Section 56 4(b)(1) of the Act, 21 U.S.C. section 360bbb-3(b)(1), unless the authorization is terminated or revoked  sooner. Performed at [facility], [place] [address], [place], [place] [address]          Radiology Studies: Ct Hip Left Wo Contrast  Result Date: 04/14/2019 CLINICAL DATA:  Fall, left hip pain EXAM: CT OF THE LEFT HIP WITHOUT CONTRAST TECHNIQUE: Multidetector CT imaging of the left hip was performed according to the standard protocol. Multiplanar CT image reconstructions were also generated. COMPARISON:  Plain films earlier today FINDINGS: There is a left femoral neck fracture as seen on plain films. No significant displacement. No angulation. No subluxation or dislocation. IMPRESSION: Nondisplaced left femoral neck fracture. Electronically Signed   By: Rolm Baptise M.D.   On: 04/14/2019 02:17   Dg Chest Portable 1 View  Result Date: 04/14/2019 CLINICAL DATA:  Fall, preop. EXAM: PORTABLE CHEST 1 VIEW COMPARISON:  Chest radiograph 07/20/2018 FINDINGS: Unchanged cardiomegaly. Unchanged mediastinal contours with aortic atherosclerosis. Bilateral pleural effusions, not significantly changed from prior exam. Progressive vascular congestion, suggestion of central pulmonary edema, new. No pneumothorax. No acute osseous abnormalities are seen. IMPRESSION: 1. Chronic cardiomegaly and bilateral pleural effusions, similar to February 2020 radiograph. 2. Vascular congestion has progressed, suggestion of mild superimposed pulmonary edema is new. Aortic Atherosclerosis (ICD10-I70.0). Electronically Signed   By: Keith Rake M.D.   On: 04/14/2019 01:28   Dg Hip Operative Unilat W Or W/o Pelvis Left  Result Date: 04/14/2019 CLINICAL DATA:  Intraoperative left hip fluoroscopy EXAM: OPERATIVE LEFT HIP WITH PELVIS COMPARISON:  CT hip 04/14/2019, radiographs 04/14/2019 FINDINGS: Sequential intraoperative fluoroscopic views demonstrate placement of 3 partially threaded, cannulated screws transfixing the subcapital left femoral neck fracture seen on comparison radiographs. No evidence of acute hardware  complication. Alignment is near anatomic on these nonstandard views. Soft tissues poorly evaluated in the limited fluoroscopic collimation. IMPRESSION: Intraoperative fluoroscopic views demonstrating 3 partially threaded, cannulated screws transfixing the subcapital left femoral neck fracture. No evidence of acute hardware complication. Electronically Signed   By: Lovena Le M.D.   On: 04/14/2019 13:51   Dg Hip Unilat With Pelvis 2-3 Views Left  Result Date: 04/14/2019 CLINICAL DATA:  Fall, left hip pain EXAM: DG HIP (WITH OR WITHOUT PELVIS) 2-3V LEFT COMPARISON:  None. FINDINGS: There is cortical irregularity noted along the left lateral femoral neck. Lucency noted in the region of the femoral neck. Findings are concerning for subtle left femoral neck fracture. This could be confirmed with CT. No subluxation or dislocation. IMPRESSION: Findings concerning for subtle left femoral neck fracture. This could be confirmed with CT. Electronically Signed   By: Rolm Baptise M.D.   On: 04/14/2019 01:27        Scheduled Meds: . acetaminophen  1,000 mg Oral TID  . amiodarone  200 mg Oral Daily  . Chlorhexidine Gluconate Cloth  6 each Topical Q0600  . docusate sodium  100 mg  Oral BID  . feeding supplement (NEPRO CARB STEADY)  237 mL Oral BID BM  . gentamicin cream  1 application Topical Daily  . heparin injection (subcutaneous)  5,000 Units Subcutaneous Q8H  . levothyroxine  50 mcg Oral Daily  . midodrine  10 mg Oral TID  . multivitamin  1 tablet Oral Daily  . tamsulosin  0.4 mg Oral QPM   Continuous Infusions: . dialysis solution 1.5% low-MG/low-CA       LOS: 1 day    Time spent: 25 minutes    Edwin Dada, MD [facility] 04/15/2019, 12:11 PM     Please page though [place] or Epic secure chat:  For password, contact charge nurse

## 2019-04-15 NOTE — Anesthesia Postprocedure Evaluation (Signed)
Anesthesia Post Note  Patient: Ruben Hudson  Procedure(s) Performed: CANNULATED HIP PINNING, LEFT (Left Hip)  Patient location during evaluation: Nursing Unit Anesthesia Type: Spinal Level of consciousness: awake and alert and confused Pain management: pain level controlled Vital Signs Assessment: post-procedure vital signs reviewed and stable Respiratory status: spontaneous breathing and respiratory function stable Cardiovascular status: blood pressure returned to baseline and stable Postop Assessment: no headache, no backache, no apparent nausea or vomiting and patient able to bend at knees Anesthetic complications: no     Last Vitals:  Vitals:   04/15/19 0438 04/15/19 0729  BP: 131/66 122/77  Pulse: 73 72  Resp: 16 17  Temp: 36.8 C 36.7 C  SpO2: 95% 100%    Last Pain:  Vitals:   04/15/19 0729  TempSrc: Oral  PainSc:                  Caryl Asp

## 2019-04-15 NOTE — Progress Notes (Signed)
Initial Nutrition Assessment  RD working remotely.  DOCUMENTATION CODES:   Not applicable  INTERVENTION:  Provide Nepro Shake po BID, each supplement provides 425 kcal and 19 grams protein.  Continue Rena-vite daily.  NUTRITION DIAGNOSIS:   Increased nutrient needs related to post-op healing, catabolic illness(ESRD on PD, CHF) as evidenced by estimated needs.  GOAL:   Patient will meet greater than or equal to 90% of their needs  MONITOR:   PO intake, Supplement acceptance, Labs, Weight trends, I & O's  REASON FOR ASSESSMENT:   Consult Hip fracture protocol  ASSESSMENT:   85 year old male with PMHx of HLD, ESRD on PD, CHF, hx MI, hx CVA, anxiety, arthritis, A-fib admitted after a fall found to have left hip fracture now s/p left cannulated hip pinning on 11/18.   Called patient's room to obtain nutrition/weight history and patient's wife answered. She reports patient has a decreased appetite. He only ate a small amount of cereal for breakfast. She reports prior to his fall his appetite had started to pick up and he was eating more at meals. Overall he had a decrease in appetite and intake over the past 2 years that had led to weight loss but he was slowly starting to gain back. She reports his UBW had been in the 140s but his dry weight after dialysis was around 134 lbs now. He is currently 59.9 kg (132.06 lbs). Wife reports patient drinks protein shakes and eats protein bars they get from the dialysis center but she cannot recall the brand name. She reports he will be amenable to drinking ONS here.  Medications reviewed and include: amiodarone, Colace 100 mg BID, levothyroxine, Rena-vite daily, Flomax.  Labs reviewed: BUN 35, Creatinine 2.92.  Patient is at risk for malnutrition but unable to determine if he meets criteria without completing NFPE.  NUTRITION - FOCUSED PHYSICAL EXAM:  Unable to complete at this time.  Diet Order:   Diet Order            Diet Heart Room  service appropriate? Yes; Fluid consistency: Thin  Diet effective now             EDUCATION NEEDS:   No education needs have been identified at this time  Skin:  Skin Assessment: Skin Integrity Issues:(closed incision left hip)  Last BM:  04/13/2019 per chart  Height:   Ht Readings from Last 1 Encounters:  04/14/19 5\' 10"  (1.778 m)   Weight:   Wt Readings from Last 1 Encounters:  04/14/19 59.9 kg   Ideal Body Weight:  75.5 kg  BMI:  Body mass index is 18.95 kg/m.  Estimated Nutritional Needs:   Kcal:  1800-2000  Protein:  90-100 grams  Fluid:  UOP + 1 L  Jacklynn Barnacle, MS, RD, LDN Office: [phone] Pager: [phone] After Hours/Weekend Pager: [phone]

## 2019-04-15 NOTE — Plan of Care (Signed)
Pt confused. Can get angry and curse at staff. Has to be reminded he is in hospital. Pt has moments of hallucinations and confusion. Wife is aware and states he is like this but better at home. Pt close to nurses desk and wife at beside. Pdowless,rn

## 2019-04-15 NOTE — TOC Initial Note (Signed)
Transition of Care [facility]) - Initial/Assessment Note    Patient Details  Name: Ruben Hudson MRN: [id number] Date of Birth: 23-Jul-1925  Transition of Care [facility]) CM/SW Contact:    Shelbie Hutching, RN.   Phone Number: 04/15/2019, 4:15 PM  Clinical Narrative:                 Patient admitted for hip fracture after falling at home.  Patient lives at home with his wife.  Patient is on chronic O2 at 2L, his oxygen is from [place], patient's wife reports he also has an oxygen concentration from [employer] that he uses when he goes out.  Patient is ESRD and on peritoneal dialysis at home.  [name] does his PD at night.   PT has recommended SNF but if patient goes to SNF he would have to transition to Hemodialysis and the wife and patient do not want to do that.  [name] reports that she can care for the patient at home and would like home health set up.   Patient has a wheelchair at home, rolling walker, and bedside commode.  Patient does not want a hospital bed at this time.   If patient does well overnight patient should discharge tomorrow. Patient and wife have no preference in [facility] agency.  Referral given to Cassie with [facility]- Cassie has accepted referral for RN, PT, OT, and aide.  Wife will provide transportation home.   Expected Discharge Plan: [place] Barriers to Discharge: Continued Medical Work up   Patient Goals and CMS Choice   CMS Medicare.gov Compare Post Acute Care list provided to:: Patient Choice offered to / list presented to : Patient  Expected Discharge Plan and Services Expected Discharge Plan: [place]   Discharge Planning Services: CM Consult Post Acute Care Choice: [place] arrangements for the past 2 months: Single Family Home                           HH Arranged: RN, PT, OT, Nurse's Aide          Prior Living Arrangements/Services Living arrangements for the past 2 months: Single Family  Home Lives with:: Spouse Patient language and need for interpreter reviewed:: Yes Do you feel safe going back to the place where you live?: Yes      Need for Family Participation in Patient Care: Yes (Comment)(hip fracture) Care giver support system in place?: Yes (comment)(wife) Current home services: DME(Wheelchair, RW, 3 in 1) Criminal Activity/Legal Involvement Pertinent to Current Situation/Hospitalization: No - Comment as needed  Activities of Daily Living Home Assistive Devices/Equipment: Environmental consultant (specify type), Cane (specify quad or straight) ADL Screening (condition at time of admission) Patient's cognitive ability adequate to safely complete daily activities?: Yes Is the patient deaf or have difficulty hearing?: No Does the patient have difficulty seeing, even when wearing glasses/contacts?: No Does the patient have difficulty concentrating, remembering, or making decisions?: No Patient able to express need for assistance with ADLs?: Yes Does the patient have difficulty dressing or bathing?: No Independently performs ADLs?: Yes (appropriate for developmental age) Does the patient have difficulty walking or climbing stairs?: No Weakness of Legs: Left Weakness of Arms/Hands: None  Permission Sought/Granted Permission sought to share information with : Case Manager, Family Supports Permission granted to share information with : Yes, Verbal Permission Granted     Permission granted to share info w AGENCY: [facility] agency  Permission granted to share info w Relationship: Wife     Emotional Assessment Appearance:: Appears stated age Attitude/Demeanor/Rapport: Engaged Affect (typically observed): Accepting Orientation: : Oriented to Self, Oriented to Place, Oriented to  Time, Oriented to Situation Alcohol / Substance Use: Not Applicable Psych Involvement: No (comment)  Admission diagnosis:  Closed fracture of left hip, initial encounter ([place]) [S72.002A] Closed left hip  fracture, initial encounter [facility]) [S72.002A] Patient Active Problem List   Diagnosis Date Noted  . Closed left hip fracture, initial encounter ([place]) 04/14/2019  . Atrial fibrillation, chronic ([place]) 04/14/2019  . Protein-calorie malnutrition, severe 12/04/2017  . Ventral hernia with bowel obstruction 12/01/2017  . Incarcerated umbilical hernia   . ESRD (end stage renal disease) ([place]) 08/01/2017  . ESRD on dialysis ([place]) 07/09/2017  . Severe epistaxis 04/07/2017  . Palliative care by specialist   . Goals of care, counseling/discussion   . Atrial fibrillation with RVR ([place]) 03/09/2017  . Recurrent umbilical hernia with incarceration   . Small bowel obstruction ([place])   . SOB (shortness of breath) 09/23/2016  . Acute on chronic systolic CHF (congestive heart failure) ([place]) 08/23/2016  . CKD (chronic kidney disease), stage III 08/23/2016  . Elevated troponin 08/23/2016  . Thrombocytopenia ([place]) 08/23/2016  . Hyperkalemia 11/06/2015  . Urinary urgency 10/31/2015  . Chronic kidney disease, stage 3 ([place]) 10/31/2015  . Congestive heart failure ([place]) 10/31/2015  . Hyperlipidemia 10/31/2015  . BP (high blood pressure) 10/31/2015  . Cerebrovascular accident (CVA) ([place]) 10/31/2015  . Phimosis 10/31/2015  . 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 Benign fibroma of prostate 05/30/2014  . Urge incontinence of urine 05/30/2014  . Chronic kidney disease, stage IV (severe) ([place]) 02/16/2014  . Acute pulmonary edema ([place]) 02/26/2013  . Unstable angina pectoris ([place]) 02/26/2013  . Hydrocele 02/25/2012  . Benign prostatic hyperplasia with urinary obstruction 02/24/2012   PCP:  Kirk Ruths, MD Pharmacy:   [id number] [place], [address] [place] [address] Phone: [phone] Fax: [phone]     Social Determinants of Health (SDOH) Interventions    Readmission Risk Interventions No flowsheet data  found.

## 2019-04-15 NOTE — Progress Notes (Signed)
  Subjective: 1 Day Post-Op Procedure(s) (LRB): CANNULATED HIP PINNING, LEFT (Left) Patient reports pain as mild.   Patient seen in rounds with Dr. Rudene Christians. Patient is well, and has had no acute complaints or problems Plan is to go Home versus rehab after hospital stay. Negative for chest pain and shortness of breath Fever: no Gastrointestinal: Negative for nausea and vomiting  Objective: Vital signs in last 24 hours: Temp:  [97.6 F (36.4 C)-98.7 F (37.1 C)] 98.2 F (36.8 C) (11/19 0438) Pulse Rate:  [61-97] 73 (11/19 0438) Resp:  [12-29] 16 (11/19 0438) BP: (113-134)/(53-93) 131/66 (11/19 0438) SpO2:  [91 %-100 %] 95 % (11/19 0438) Weight:  [59.9 kg] 59.9 kg (11/18 1152)  Intake/Output from previous day:  Intake/Output Summary (Last 24 hours) at 04/15/2019 0655 Last data filed at 04/15/2019 0400 Gross per 24 hour  Intake 821.51 ml  Output 425 ml  Net 396.51 ml    Intake/Output this shift: Total I/O In: 452.7 [I.V.:352.7; IV Piggyback:100] Out: 375 [Urine:375]  Labs: Recent Labs    04/14/19 0223 04/15/19 0506  HGB 13.8 11.8*   Recent Labs    04/14/19 0223 04/15/19 0506  WBC 15.3* 10.3  RBC 4.30 3.74*  HCT 42.5 37.5*  PLT 186 162   Recent Labs    04/14/19 0223 04/15/19 0506  NA 142 142  K 4.3 4.1  CL 103 106  CO2 28 25  BUN 41* 35*  CREATININE 3.53* 2.92*  GLUCOSE 136* 160*  CALCIUM 8.6* 8.3*   No results for input(s): LABPT, INR in the last 72 hours.   EXAM General - Patient is Alert and Confused Extremity - Sensation intact distally Dorsiflexion/Plantar flexion intact Compartment soft Dressing/Incision - clean, blood tinged drainage with mild saturation on the bandage Motor Function - intact, moving foot and toes well on exam.   Past Medical History:  Diagnosis Date  . Acute pulmonary edema ([place]) 02/26/2013  . Anxiety   . Arthritis    little  . Benign prostatic hyperplasia with urinary obstruction 02/24/2012  . BP (high blood pressure)  10/31/2015  . Cataract extraction status of left eye   . Cataract extraction status of right eye   . Cerebrovascular accident (CVA) ([place]) 04/24/2012   affected right eye, limited peripheral vision  . Chronic kidney disease 07/25/2017   stage 3  . Congestive heart failure ([place]) 10/31/2015  . Dyspnea    uses continuous o2  . Dysrhythmia    AF  . HLD (hyperlipidemia) 10/31/2015  . Hydrocele 02/25/2012  . Myocardial infarction ([place])    per patient, small MI  . Renal insufficiency   . Unstable angina pectoris ([place]) 02/26/2013  . Urge incontinence of urine 05/30/2014  . Urinary urgency 10/31/2015    Assessment/Plan: 1 Day Post-Op Procedure(s) (LRB): CANNULATED HIP PINNING, LEFT (Left) Principal Problem:   Closed left hip fracture, initial encounter [facility]) Active Problems:   Congestive heart failure (HCC)   ESRD on dialysis [facility])   Atrial fibrillation, chronic (HCC)  Estimated body mass index is 18.95 kg/m as calculated from the following:   Height as of this encounter: 5\' 10"  (1.778 m).   Weight as of this encounter: 59.9 kg.  Advance diet. Physical therapy.  Discharge planning for rehab versus home. Follow-up at [facility] clinic in 2 weeks after discharge for staple removal.  DVT Prophylaxis - Foot Pumps, TED hose and Heparin Weight-Bearing as tolerated to left leg  Reche Dixon, PA-C Orthopaedic Surgery 04/15/2019, 6:55 AM

## 2019-04-16 ENCOUNTER — Inpatient Hospital Stay: Payer: Medicare Other

## 2019-04-16 LAB — CBC
HCT: 33.5 % — ABNORMAL LOW (ref 39.0–52.0)
Hemoglobin: 10.9 g/dL — ABNORMAL LOW (ref 13.0–17.0)
MCH: 32.6 pg (ref 26.0–34.0)
MCHC: 32.5 g/dL (ref 30.0–36.0)
MCV: 100.3 fL — ABNORMAL HIGH (ref 80.0–100.0)
Platelets: 150 10*3/uL (ref 150–400)
RBC: 3.34 MIL/uL — ABNORMAL LOW (ref 4.22–5.81)
RDW: 14.6 % (ref 11.5–15.5)
WBC: 6.9 10*3/uL (ref 4.0–10.5)
nRBC: 0 % (ref 0.0–0.2)

## 2019-04-16 LAB — RENAL FUNCTION PANEL
Albumin: 2.7 g/dL — ABNORMAL LOW (ref 3.5–5.0)
Anion gap: 8 (ref 5–15)
BUN: 34 mg/dL — ABNORMAL HIGH (ref 8–23)
CO2: 25 mmol/L (ref 22–32)
Calcium: 8 mg/dL — ABNORMAL LOW (ref 8.9–10.3)
Chloride: 108 mmol/L (ref 98–111)
Creatinine, Ser: 2.71 mg/dL — ABNORMAL HIGH (ref 0.61–1.24)
GFR calc Af Amer: 23 mL/min — ABNORMAL LOW (ref >60)
GFR calc non Af Amer: 20 mL/min — ABNORMAL LOW (ref >60)
Glucose, Bld: 114 mg/dL — ABNORMAL HIGH (ref 70–99)
Phosphorus: 3.6 mg/dL (ref 2.5–4.6)
Potassium: 3.7 mmol/L (ref 3.5–5.1)
Sodium: 141 mmol/L (ref 135–145)

## 2019-04-16 MED ORDER — TORSEMIDE 20 MG PO TABS
20.0000 mg | ORAL_TABLET | Freq: Every day | ORAL | Status: DC
  Administered 2019-04-16 – 2019-04-18 (×3): 20 mg via ORAL
  Filled 2019-04-16 (×3): qty 1

## 2019-04-16 NOTE — TOC Benefit Eligibility Note (Signed)
Transition of Care [facility]) Benefit Eligibility Note    Patient Details  Name: Ruben Hudson MRN: [id number] Date of Birth: 1925/07/21   Medication/Dose: LOVENOX   31 MG DAILY X 14 DAYS SYRINGES  Covered?: Yes  Tier: (NO TIER)  Prescription Coverage [facility]  Spoke with Person/Company/Phone Number:: VICKY  @  [employer] [phone] OPT- MEMBER  Co-Pay: $ 205.48  Prior Approval: No  Deductible: Met  Additional Notes: ENOXAPARIN 40 MG DAILY X 14 DAYS SYRINGES, COVEER- YES, CO-PAY- $7.00, TIER- 1 DRUG , PRIOR APPROVAL- NO    Memory Argue Phone Number: 04/16/2019, 4:30 PM

## 2019-04-16 NOTE — Progress Notes (Signed)
   04/16/19 2020  Neurological  Level of Consciousness Alert  Orientation Level Oriented to person;Oriented to situation  Respiratory  Respiratory Pattern Regular;Unlabored  Bilateral Breath Sounds Clear;Diminished  Cardiac  Pulse Regular  Heart Sounds S1, S2  Peritoneal Catheter Left lower abdomen  No Placement Date or Time found.   Catheter Location: Left lower abdomen  Site Assessment Clean;Dry;Intact  Drainage Description None  Catheter status Accessed;Patent;Draining  Dressing Gauze/Drain sponge  Dressing Status Clean;Dry;Intact  Dressing Intervention Dressing changed  Psychosocial  Psychosocial (WDL) X  Patient Behaviors Irritable;Cooperative  PT STABLE WIFE AT BEDSIDE NO C/OS NO DISTRESS NOTED.Marland Kitchen

## 2019-04-16 NOTE — Care Management Important Message (Signed)
Important Message  Patient Details  Name: Ruben Hudson MRN: [id number] Date of Birth: [birth year]   Medicare Important Message Given:  Yes     [name] 04/16/2019, 11:23 AM

## 2019-04-16 NOTE — Progress Notes (Signed)
  Subjective: 2 Days Post-Op Procedure(s) (LRB): CANNULATED HIP PINNING, LEFT (Left) Patient reports pain as mild.   Patient seen in rounds with Dr. Rudene Christians. Patient is well, and has had no acute complaints or problems Plan is to go Home after hospital stay. Negative for chest pain and shortness of breath Fever: no Gastrointestinal: Negative for nausea and vomiting  Objective: Vital signs in last 24 hours: Temp:  [97.7 F (36.5 C)-98.1 F (36.7 C)] 98.1 F (36.7 C) (11/20 0331) Pulse Rate:  [60-72] 65 (11/20 0331) Resp:  [17-18] 17 (11/20 0331) BP: (103-128)/(51-77) 128/70 (11/20 0331) SpO2:  [97 %-100 %] 99 % (11/20 0331)  Intake/Output from previous day:  Intake/Output Summary (Last 24 hours) at 04/16/2019 0704 Last data filed at 04/16/2019 0500 Gross per 24 hour  Intake 480 ml  Output 500 ml  Net -20 ml    Intake/Output this shift: No intake/output data recorded.  Labs: Recent Labs    04/14/19 0223 04/15/19 0506 04/16/19 0403  HGB 13.8 11.8* 10.9*   Recent Labs    04/15/19 0506 04/16/19 0403  WBC 10.3 6.9  RBC 3.74* 3.34*  HCT 37.5* 33.5*  PLT 162 150   Recent Labs    04/15/19 0506 04/16/19 0403  NA 142 141  K 4.1 3.7  CL 106 108  CO2 25 25  BUN 35* 34*  CREATININE 2.92* 2.71*  GLUCOSE 160* 114*  CALCIUM 8.3* 8.0*   No results for input(s): LABPT, INR in the last 72 hours.   EXAM General - Patient is Alert and Confused Extremity - Sensation intact distally Dorsiflexion/Plantar flexion intact Compartment soft Dressing/Incision - clean, dry and no drainage  Motor Function - intact, moving foot and toes well on exam.  The patient ambulated 20 feet with physical therapy  Past Medical History:  Diagnosis Date  . Acute pulmonary edema ([place]) 02/26/2013  . Anxiety   . Arthritis    little  . Benign prostatic hyperplasia with urinary obstruction 02/24/2012  . BP (high blood pressure) 10/31/2015  . Cataract extraction status of left eye   .  Cataract extraction status of right eye   . Cerebrovascular accident (CVA) ([place]) 04/24/2012   affected right eye, limited peripheral vision  . Chronic kidney disease 07/25/2017   stage 3  . Congestive heart failure ([place]) 10/31/2015  . Dyspnea    uses continuous o2  . Dysrhythmia    AF  . HLD (hyperlipidemia) 10/31/2015  . Hydrocele 02/25/2012  . Myocardial infarction ([place])    per patient, small MI  . Renal insufficiency   . Unstable angina pectoris ([place]) 02/26/2013  . Urge incontinence of urine 05/30/2014  . Urinary urgency 10/31/2015    Assessment/Plan: 2 Days Post-Op Procedure(s) (LRB): CANNULATED HIP PINNING, LEFT (Left) Principal Problem:   Closed left hip fracture, initial encounter [facility]) Active Problems:   Congestive heart failure (HCC)   ESRD on dialysis [facility])   Atrial fibrillation, chronic (HCC)  Estimated body mass index is 18.95 kg/m as calculated from the following:   Height as of this encounter: 5\' 10"  (1.778 m).   Weight as of this encounter: 59.9 kg.  Advance diet. Physical therapy.  Discharge planning to go home.  Awaiting medical clearance Follow-up at [facility] clinic in 2 weeks after discharge for staple removal.  DVT Prophylaxis - Foot Pumps, TED hose and Heparin Weight-Bearing as tolerated to left leg  Reche Dixon, PA-C Orthopaedic Surgery 04/16/2019, 7:04 AM

## 2019-04-16 NOTE — Progress Notes (Signed)
Physical Therapy Treatment Patient Details Name: Ruben Hudson MRN: [id number] DOB: 03-01-1926 Today's Date: 04/16/2019    History of Present Illness Pt is a 85 y.o. male presenting to hospital 04/14/19 s/p fall opening storm door with L hip pain.  Imaging showing nondisplaced L femoral neck fx and s/p L cannulated hip pinning 04/14/19.  PMH includes ESRD on nightly peritoneal dialysis, cardiomyopathy with reduced EF, home O2, CVA, R eye with limited peripheral vision, CKD stage 3, CHF, dysrhythmia, MI, a-fib wit RVR, h/o R ankle fx surgery, 2 L home O2 chronic.    PT Comments    Pt sitting on edge of bed upon PT arrival with pt's wife present.  Pt able to walk 20 feet and then 40 feet with RW, 1 assist, and close chair follow.  Limited distance ambulating d/t L hip pain and overall fatigue (pt requiring and requesting sitting rest break between ambulation trials).  Vc's required for safe transfers and gait technique.  Will continue to focus on strengthening and progressive functional mobility during hospital stay.   Follow Up Recommendations  SNF     Equipment Recommendations  Rolling walker with 5" wheels;3in1 (PT)    Recommendations for Other Services OT consult     Precautions / Restrictions Precautions Precautions: Fall Precaution Comments: peritoneal dialysis Restrictions Weight Bearing Restrictions: Yes LLE Weight Bearing: Weight bearing as tolerated    Mobility  Bed Mobility               General bed mobility comments: Deferred (pt sitting up on edge of bed upon PT arrival with pt's wife present)  Transfers Overall transfer level: Needs assistance Equipment used: Rolling walker (2 wheeled) Transfers: Sit to/from Stand Sit to Stand: Min assist         General transfer comment: assist to initiate and come to full stand; vc's for UE/LE placement and safe use of RW; x1 trial from bed and x1 trial from recliner (transfers)  Ambulation/Gait Ambulation/Gait  assistance: Min guard;Min assist;+2 safety/equipment(chair follow for safety) Gait Distance (Feet): (20 feet; 40 feet) Assistive device: Rolling walker (2 wheeled)   Gait velocity: decreased   General Gait Details: antalgic; decreased stance time L LE; L LE knee initially mildly flexing when attempting to take step with R LE but with vc's for increasing UE support through RW and keeping walker closer pt able to offweight L LE when advancing R LE (intermittent cueing still required though)   Stairs             Wheelchair Mobility    Modified Rankin (Stroke Patients Only)       Balance Overall balance assessment: Needs assistance Sitting-balance support: No upper extremity supported;Feet supported Sitting balance-Leahy Scale: Good Sitting balance - Comments: steady sitting reaching within BOS   Standing balance support: Bilateral upper extremity supported Standing balance-Leahy Scale: Poor Standing balance comment: pt requiring at least single UE support on RW for static standing balance                            Cognition Arousal/Alertness: Awake/alert Behavior During Therapy: Impulsive Overall Cognitive Status: History of cognitive impairments - at baseline                                 General Comments: Oriented to person and place and situation; generalized confusion noted      Exercises  General Comments General comments (skin integrity, edema, etc.): minimal drainage noted L thigh dressing.  Pt agreeable to PT session.      Pertinent Vitals/Pain Pain Assessment: 0-10 Pain Score: 7  Pain Location: L hip/thigh Pain Descriptors / Indicators: Guarding;Constant;Discomfort;Aching;Tender Pain Intervention(s): Limited activity within patient's tolerance;Monitored during session;Premedicated before session;Repositioned  Vitals (HR and O2 on 2 L via nasal cannula) stable and WFL throughout treatment session.    Home Living                       Prior Function            PT Goals (current goals can now be found in the care plan section) Acute Rehab PT Goals Patient Stated Goal: to improve mobility and have less pain PT Goal Formulation: With patient/family Time For Goal Achievement: 04/29/19 Potential to Achieve Goals: Good Progress towards PT goals: Progressing toward goals    Frequency    BID      PT Plan Current plan remains appropriate    Co-evaluation              AM-PAC PT "6 Clicks" Mobility   Outcome Measure  Help needed turning from your back to your side while in a flat bed without using bedrails?: A Lot Help needed moving from lying on your back to sitting on the side of a flat bed without using bedrails?: A Lot Help needed moving to and from a bed to a chair (including a wheelchair)?: A Little Help needed standing up from a chair using your arms (e.g., wheelchair or bedside chair)?: A Little Help needed to walk in hospital room?: A Little Help needed climbing 3-5 steps with a railing? : A Lot 6 Click Score: 15    End of Session Equipment Utilized During Treatment: Gait belt Activity Tolerance: Patient limited by pain;Patient limited by fatigue Patient left: in chair;with call bell/phone within reach;with chair alarm set;with family/visitor present;with SCD's reapplied;Other (comment)(B heels floating via pillow) Nurse Communication: Mobility status;Precautions;Weight bearing status PT Visit Diagnosis: Other abnormalities of gait and mobility (R26.89);Muscle weakness (generalized) (M62.81);History of falling (Z91.81);Difficulty in walking, not elsewhere classified (R26.2);Pain Pain - Right/Left: Left Pain - part of body: Hip     Time: [id number] PT Time Calculation (min) (ACUTE ONLY): 24 min  Charges:  $Gait Training: 8-22 mins $Therapeutic Activity: 8-22 mins                     Leitha Bleak, PT 04/16/19, 1:54 PM [phone]

## 2019-04-16 NOTE — Progress Notes (Signed)
   04/16/19 2035  Cycler Setup  Total Number of Exchanges 3  Fill Volume 2000  Dianeal Solution Dextrose 1.5% in 6000 mL  Last Fill Volume 0  Fill Time - Minute(s) 10  Dwell Time - Hour(s) 8  Drain Time - Minute(s) 20 mins  Pause Option Status Initiated  Exit Site Care Performed Yes  TX INITIATED W/O DIFFICULTY CATHETER WDL

## 2019-04-16 NOTE — Progress Notes (Signed)
[facility], [place] 04/16/19  Subjective:   LOS: 2 11/19 0701 - 11/20 0700 In: 480 [P.O.:480] Out: 500 [Urine:500]  Patient known to our practice from outpatient Presents after falling at home S/p Left hip pinning for left hip fracture Has had intermittent confusion from pain medications States he is not in good mood today Wife at bedside Oral intake is fair PD is going well + cough with dark sputum   Objective:  Vital signs in last 24 hours:  Temp:  [97.7 F (36.5 C)-98.1 F (36.7 C)] 98.1 F (36.7 C) (11/20 0331) Pulse Rate:  [60-65] 65 (11/20 0845) Resp:  [17-18] 17 (11/20 0845) BP: (103-129)/(51-72) 129/72 (11/20 0845) SpO2:  [95 %-100 %] 95 % (11/20 0845)  Weight change:  Filed Weights   04/14/19 0034 04/14/19 1152  Weight: 59.9 kg 59.9 kg    Intake/Output:    Intake/Output Summary (Last 24 hours) at 04/16/2019 1506 Last data filed at 04/16/2019 1400 Gross per 24 hour  Intake 6240 ml  Output 439 ml  Net 5801 ml     Physical Exam: General:  No acute distress, sitting up in the chair  HEENT  anicteric, moist oral mucous membranes  Pulm/lungs  coarse breath sounds [place] O2 continuous  CVS/Heart  no rub  Abdomen:   Soft, nontender  Extremities:  Trace edema  Neurologic:  Alert, able to answer questions  Skin:  No acute rashes  Access:  PD catheter       Basic Metabolic Panel:  Recent Labs  Lab 04/14/19 0223 04/15/19 0506 04/16/19 0403  NA 142 142 141  K 4.3 4.1 3.7  CL 103 106 108  CO2 28 25 25   GLUCOSE 136* 160* 114*  BUN 41* 35* 34*  CREATININE 3.53* 2.92* 2.71*  CALCIUM 8.6* 8.3* 8.0*  PHOS  --   --  3.6     CBC: Recent Labs  Lab 04/14/19 0223 04/15/19 0506 04/16/19 0403  WBC 15.3* 10.3 6.9  NEUTROABS 14.0*  --   --   HGB 13.8 11.8* 10.9*  HCT 42.5 37.5* 33.5*  MCV 98.8 100.3* 100.3*  PLT 186 162 150      Lab Results  Component Value Date   HEPBSAG Negative 03/17/2017       Microbiology:  Recent Results (from the past 240 hour(s))  SARS CORONAVIRUS 2 (TAT 6-24 HRS) Nasopharyngeal Nasopharyngeal Swab     Status: None   Collection Time: 04/14/19  3:35 AM   Specimen: Nasopharyngeal Swab  Result Value Ref Range Status   SARS Coronavirus 2 NEGATIVE NEGATIVE Final    Comment: (NOTE) SARS-CoV-2 target nucleic acids are NOT DETECTED. The SARS-CoV-2 RNA is generally detectable in upper and lower respiratory specimens during the acute phase of infection. Negative results do not preclude SARS-CoV-2 infection, do not rule out co-infections with other pathogens, and should not be used as the sole basis for treatment or other patient management decisions. Negative results must be combined with clinical observations, patient history, and epidemiological information. The expected result is Negative. Fact Sheet for Patients: SugarRoll.be Fact Sheet for Healthcare Providers: https://www.woods-mathews.com/ This test is not yet approved or cleared by the [place] FDA and  has been authorized for detection and/or diagnosis of SARS-CoV-2 by FDA under an Emergency Use Authorization (EUA). This EUA will remain  in effect (meaning this test can be used) for the duration of the COVID-19 declaration under Section 56 4(b)(1) of the Act, 21 U.S.C. section 360bbb-3(b)(1), unless the authorization  is terminated or revoked sooner. Performed at [facility], [place] [address], [place], [place] [address]     Coagulation Studies: No results for input(s): LABPROT, INR in the last 72 hours.  Urinalysis: No results for input(s): COLORURINE, LABSPEC, PHURINE, GLUCOSEU, HGBUR, BILIRUBINUR, KETONESUR, PROTEINUR, UROBILINOGEN, NITRITE, LEUKOCYTESUR in the last 72 hours.  Invalid input(s): APPERANCEUR    Imaging: No results found.   Medications:   . dialysis solution 1.5% low-MG/low-CA     . acetaminophen  1,000 mg Oral  TID  . amiodarone  200 mg Oral Daily  . aspirin EC  81 mg Oral Daily  . Chlorhexidine Gluconate Cloth  6 each Topical Q0600  . docusate sodium  100 mg Oral BID  . feeding supplement (NEPRO CARB STEADY)  237 mL Oral BID BM  . gentamicin cream  1 application Topical Daily  . heparin injection (subcutaneous)  5,000 Units Subcutaneous Q8H  . levothyroxine  50 mcg Oral Daily  . midodrine  10 mg Oral TID  . multivitamin  1 tablet Oral Daily  . tamsulosin  0.4 mg Oral QPM   alum & mag hydroxide-simeth, bisacodyl, heparin, HYDROmorphone (DILAUDID) injection, magnesium citrate, magnesium hydroxide, menthol-cetylpyridinium **OR** phenol, metoCLOPramide **OR** metoCLOPramide (REGLAN) injection, ondansetron **OR** ondansetron (ZOFRAN) IV, oxyCODONE  Assessment/ Plan:  85 y.o. male with end-stage renal disease on peritoneal dialysis, atrial fibrillation, history of GI bleed, cardiomyopathy EF 20% 2019, hypothyroidism, history of stroke presents to hospital with left hip fracture after a fall-status post pinning April 14, 2019 Dr. Rudene Christians.  Principal Problem:   Closed left hip fracture, initial encounter [facility]) Active Problems:   Congestive heart failure (HCC)   ESRD on dialysis [facility])   Atrial fibrillation, chronic (HCC)   #.  End-stage renal disease on peritoneal dialysis Target Wt 62 Kg Total Fill Vol 24 5700 ml PD Type PD with Cycler Total Fills 3 Fills/Cycle @ 1900 ml We will continue prescription of 2000 mL for 3 cycles.  Using 1.5% bags   #. Anemia of CKD  Lab Results  Component Value Date   HGB 10.9 (L) 04/16/2019  Continue to monitor.  No indication for Epogen  #. [facility]     Component Value Date/Time   PTH 56 12/03/2017 1443   Lab Results  Component Value Date   PHOS 3.6 04/16/2019  Monitor phosphorus.  Not on binders at home. Nepro feeding supplements  #.  Left hip fracture Status post pinning November 18 No peritoneal dialysis available at [facility] . Patient and wife do  not want to do hemodialysis as he has done poorly with HD in the past.  Discharge planning currently ongoing for home with home health  # cough with sputum - obtain pCXR   LOS: 2 Simeon Vera [facility] 11/20/20203:06 PM  [place], [place]

## 2019-04-16 NOTE — TOC Progression Note (Signed)
Transition of Care [facility]) - Progression Note    Patient Details  Name: Ruben Hudson MRN: [id number] Date of Birth: October 29, 1925  Transition of [facility] CM/SW Contact  Shelbie Hutching, RN Phone Number: 04/16/2019, 3:40 PM  Clinical Narrative:    Patient will stay in the hospital over the weekend.  Wife does not want patient to go to SNF because he cannot get peritoneal dialysis there.  If patient goes to SNF he will need a Hemodialysis catheter and new dialysis center.  Elvera Bicker with patient pathways is aware of current plan to see how patient does over the weekend before discharging home.     Expected Discharge Plan: [place] Barriers to Discharge: Continued Medical Work up  Expected Discharge Plan and Services Expected Discharge Plan: [place]   Discharge Planning Services: CM Consult Post Acute Care Choice: [place] arrangements for the past 2 months: Single Family Home                           HH Arranged: RN, PT, OT, Nurse's Aide           Social Determinants of Health (SDOH) Interventions    Readmission Risk Interventions No flowsheet data found.

## 2019-04-16 NOTE — Progress Notes (Signed)
Physical Therapy Treatment Patient Details Name: Ruben Hudson MRN: [id number] DOB: [birth year] Today's Date: 04/16/2019    History of Present Illness Pt is a 85 y.o. male presenting to hospital 04/14/19 s/p fall opening storm door with L hip pain.  Imaging showing nondisplaced L femoral neck fx and s/p L cannulated hip pinning 04/14/19.  PMH includes ESRD on nightly peritoneal dialysis, cardiomyopathy with reduced EF, home O2, CVA, R eye with limited peripheral vision, CKD stage 3, CHF, dysrhythmia, MI, a-fib wit RVR, h/o R ankle fx surgery, 2 L home O2 chronic.    PT Comments    Pt sitting in recliner upon PT arrival; wife present.  Pt able to progress to ambulating 80 feet with RW with 1 assist plus chair follow.  Limited distance ambulating d/t L hip/thigh pain and fatigue.  Discussed with pt's wife regarding wife assisting pt during session but pt's wife reporting she wanted to listen and observe today (did not want to do hands on today).  Will continue to progress pt with strengthening and progressive functional mobility per pt tolerance.    Follow Up Recommendations  Home health PT;Supervision/Assistance - 24 hour     Equipment Recommendations  Rolling walker with 5" wheels;3in1 (PT)    Recommendations for Other Services OT consult     Precautions / Restrictions Precautions Precautions: Fall Precaution Comments: peritoneal dialysis Restrictions Weight Bearing Restrictions: Yes LLE Weight Bearing: Weight bearing as tolerated    Mobility  Bed Mobility Overal bed mobility: Needs Assistance Bed Mobility: Sit to Supine       Sit to supine: Max assist;HOB elevated   General bed mobility comments: assist for trunk and B LE's  Transfers Overall transfer level: Needs assistance Equipment used: Rolling walker (2 wheeled) Transfers: Sit to/from Stand Sit to Stand: Min guard         General transfer comment: increased effort to stand up to RW; vc's for UE/LE  placement  Ambulation/Gait Ambulation/Gait assistance: Min guard;+2 safety/equipment(chair follow for safety) Gait Distance (Feet): 80 Feet Assistive device: Rolling walker (2 wheeled)   Gait velocity: decreased   General Gait Details: antalgic; decreased stance time L LE; L LE knee initially mildly flexing when attempting to take step with R LE but with vc's for increasing UE support through RW and keeping walker closer pt able to offweight L LE when advancing R LE (intermittent cueing still required though)   Stairs             Wheelchair Mobility    Modified Rankin (Stroke Patients Only)       Balance Overall balance assessment: Needs assistance Sitting-balance support: No upper extremity supported;Feet supported Sitting balance-Leahy Scale: Good Sitting balance - Comments: steady sitting reaching within BOS   Standing balance support: Bilateral upper extremity supported Standing balance-Leahy Scale: Poor Standing balance comment: pt requiring at least single UE support on RW for static standing balance                            Cognition Arousal/Alertness: Awake/alert Behavior During Therapy: Impulsive Overall Cognitive Status: History of cognitive impairments - at baseline                                 General Comments: Oriented to person and place and situation; generalized confusion noted      Exercises      General Comments General  comments (skin integrity, edema, etc.): minimal drainage noted L thigh dressing.  Nursing cleared pt for participation in physical therapy.  Pt agreeable to PT session.      Pertinent Vitals/Pain Pain Assessment: Faces Faces Pain Scale: Hurts little more Pain Location: L hip/thigh Pain Descriptors / Indicators: Aching;Sore;Tender;Operative site guarding Pain Intervention(s): Limited activity within patient's tolerance;Monitored during session;Repositioned  Vitals (HR and O2 on 2 L O2 via nasal  cannula) stable and WFL throughout treatment session.    Home Living                      Prior Function            PT Goals (current goals can now be found in the care plan section) Acute Rehab PT Goals Patient Stated Goal: to improve mobility and have less pain PT Goal Formulation: With patient/family Time For Goal Achievement: 04/29/19 Potential to Achieve Goals: Good Progress towards PT goals: Progressing toward goals    Frequency    BID      PT Plan Current plan remains appropriate    Co-evaluation              AM-PAC PT "6 Clicks" Mobility   Outcome Measure  Help needed turning from your back to your side while in a flat bed without using bedrails?: A Lot Help needed moving from lying on your back to sitting on the side of a flat bed without using bedrails?: A Lot Help needed moving to and from a bed to a chair (including a wheelchair)?: A Little Help needed standing up from a chair using your arms (e.g., wheelchair or bedside chair)?: A Little Help needed to walk in hospital room?: A Little Help needed climbing 3-5 steps with a railing? : A Lot 6 Click Score: 15    End of Session Equipment Utilized During Treatment: Gait belt Activity Tolerance: Patient limited by pain;Patient limited by fatigue Patient left: in bed;with call bell/phone within reach;with bed alarm set;with family/visitor present;Other (comment)(B heels floating via pillow; bed in lowest position) Nurse Communication: Mobility status;Precautions;Weight bearing status(pt's pain status) PT Visit Diagnosis: Other abnormalities of gait and mobility (R26.89);Muscle weakness (generalized) (M62.81);History of falling (Z91.81);Difficulty in walking, not elsewhere classified (R26.2);Pain Pain - Right/Left: Left Pain - part of body: Hip     Time: [id number] PT Time Calculation (min) (ACUTE ONLY): 27 min  Charges:  $Gait Training: 8-22 mins $Therapeutic Activity: 8-22 mins                      Leitha Bleak, PT 04/16/19, 4:46 PM [phone]

## 2019-04-16 NOTE — Progress Notes (Addendum)
D: Pt alert and oriented x3, pt experiences some confusion. Pt denies experiencing any pain at this time. Pt was OOB w/PT. Discussed with pt's wife per physician's request if son could come in tomorrow and observer how he can assist with care of pt. Pt's wife agreed to reach out to son.   A: Scheduled medications administered to pt, per MD orders. Support and encouragement provided. Frequent verbal contact made.  R: No adverse drug reactions noted. Pt complaint with medications and treatment plan. Pt interacts well with staff on the unit. Pt is stable at this time, will continue to monitor and provide care for as ordered.

## 2019-04-16 NOTE — Progress Notes (Signed)
PROGRESS NOTE    Ruben Hudson  [id number] DOB: Oct 21, 1925 DOA: 04/14/2019 PCP: Kirk Ruths, MD      Brief Narrative:  Mr. Gasparini is a 85 y.o. male with history of A. fib not on anticoagulation secondary to GI bleed, cardiomyopathy last EF measured was 20% in 2019, ESRD on peritoneal dialysis for last 2 years, hypothyroidism, history of stroke with partial vision loss in the right eye on aspirin had a fall at home when he was trying to open the storm door.  Had a mechanical fall after blowing leaves in yard.  Had immediate hip pain.  Did not lose consciousness. Denied any chest pain or shortness of breath.  In the ER CT left hip showed left hip fracture.  On-call orthopedic surgeon and nephrologist were consulted.  Labs revealed WBC of 15.3 potassium 4.3 bicarb 28.  EKG showed old LBBB.         Assessment & Plan:  Acute left hip fracture S/p cannulated pinning of the left hip by Dr. Rudene Christians -Consult orthopedics, appreciate recommendations -Continue Tylenol for pain control -Continue oxycodone as needed   Delirium The patient has some mild confusion behavior at times and an altered sleep-wake cycle as result of his dementia and recent anesthesia. Delirium precautions:   -Lights and TV off, minimize interruptions at night  -Blinds open and lights on during day  -Glasses/hearing aid with patient  -Frequent reorientation  -PT/OT when able  -Avoid sedation medications/Beers list medications   Atrial fibrillation, chronic Not on anticoagulation due to GI bleed.  Rate controlled -Continue amiodarone    ESRD on PD -Consult nephrology to manage his routine dialysis -Resume every other day home torsemide  Chronic systolic CHF LBBB EF [id number] in 2019.  Appears euvolemic.  Hypothyroidism -Continue levothyroxine  History of stroke -Continue aspirin  Prolonged QT interval ruled out LBBB corrected QTc is 450.  Recurrent syncope -Continue midodrine  Moderate  protein calorie malnutrition -Continue Nepro  Acute blood loss anemia Mild, expected postoperative       DVT prophylaxis: Heparin Code Status: Full code Family Communication: Wife MDM and disposition Plan:  The below labs and imaging reports reviewed and summarized above.  Medication management as above.   The patient was admitted with hip fracture.  He underwent uncomplicated hip fracture repair 11/18.    At present, he has a significant medical complexity (requires daily peritoneal dialysis, also has some dementia and associated confused behavior at times that require daily management by rehabilitation MD), but he is extremely functional at baseline, ambulates without a walker, and is independent for self-cares, lives independently with his wife and is very motivated to improve and return home, where he lives with his wife, and has very good social support from a son nearby.  I suspect he can tolerate several hours physical therapy per day as well as occupational therapy from when she would benefit in order to return home in appropriate manner.        Objective: Vitals:   04/15/19 1604 04/15/19 1652 04/16/19 0331 04/16/19 0845  BP: (!) 107/51 (!) 103/59 128/70 129/72  Pulse: 64 60 65 65  Resp:  18 17 17   Temp:  97.7 F (36.5 C) 98.1 F (36.7 C)   TempSrc:  Oral Oral   SpO2: 100% 100% 99% 95%  Weight:      Height:        Intake/Output Summary (Last 24 hours) at 04/16/2019 1341 Last data filed at 04/16/2019 1004 Gross per 24  hour  Intake 6600 ml  Output 439 ml  Net 6161 ml   Filed Weights   04/14/19 0034 04/14/19 1152  Weight: 59.9 kg 59.9 kg    Examination: General appearance: Thin elderly adult male, alert and in no acute distress.  Lying in bed. HEENT: Anicteric, conjunctiva pink, lids and lashes normal. No nasal deformity, discharge, epistaxis.  Lips moist, teeth normal. OP tacky dry, no oral lesions.   Skin: Warm and dry.  No suspicious rashes or  lesions. Cardiac: RRR, no murmurs appreciated.  JVP normal, no LE edema.    Respiratory: Normal respiratory rate and rhythm.  CTAB without rales or wheezes. Abdomen: Abdomen soft.  No tenderness palpation or guarding. No ascites, distension, hepatosplenomegaly.   MSK: No deformities or effusions of the large joints of the upper or lower extremities bilaterally. Neuro: Awake and alert, mildly hard of hearing, but interactive. Naming is grossly intact, and the patient's recall, recent and remote, as well as general fund of knowledge seem moderately impaired by dementia.  Muscle tone normal, without fasciculations.  Moves all extremities with generalized weakness, and some discoordination as result of his recent surgery. Speech fluent.    Psych: Sensorium intact and responding to questions, attention normal. Affect pleasant.  Judgment and insight appear impaired by dementia.       Data Reviewed: I have personally reviewed following labs and imaging studies:  CBC: Recent Labs  Lab 04/14/19 0223 04/15/19 0506 04/16/19 0403  WBC 15.3* 10.3 6.9  NEUTROABS 14.0*  --   --   HGB 13.8 11.8* 10.9*  HCT 42.5 37.5* 33.5*  MCV 98.8 100.3* 100.3*  PLT 186 162 [id number]   Basic Metabolic Panel: Recent Labs  Lab 04/14/19 0223 04/15/19 0506 04/16/19 0403  NA 142 142 141  K 4.3 4.1 3.7  CL 103 106 108  CO2 28 25 25   GLUCOSE 136* 160* 114*  BUN 41* 35* 34*  CREATININE 3.53* 2.92* 2.71*  CALCIUM 8.6* 8.3* 8.0*  PHOS  --   --  3.6   GFR: Estimated Creatinine Clearance: 15 mL/min (A) (by C-G formula based on SCr of 2.71 mg/dL (H)). Liver Function Tests: Recent Labs  Lab 04/16/19 0403  ALBUMIN 2.7*   No results for input(s): LIPASE, AMYLASE in the last 168 hours. No results for input(s): AMMONIA in the last 168 hours. Coagulation Profile: No results for input(s): INR, PROTIME in the last 168 hours. Cardiac Enzymes: No results for input(s): CKTOTAL, CKMB, CKMBINDEX, TROPONINI in the last 168  hours. BNP (last 3 results) No results for input(s): PROBNP in the last 8760 hours. HbA1C: No results for input(s): HGBA1C in the last 72 hours. CBG: No results for input(s): GLUCAP in the last 168 hours. Lipid Profile: No results for input(s): CHOL, HDL, LDLCALC, TRIG, CHOLHDL, LDLDIRECT in the last 72 hours. Thyroid Function Tests: No results for input(s): TSH, T4TOTAL, FREET4, T3FREE, THYROIDAB in the last 72 hours. Anemia Panel: No results for input(s): VITAMINB12, FOLATE, FERRITIN, TIBC, IRON, RETICCTPCT in the last 72 hours. Urine analysis:    Component Value Date/Time   COLORURINE AMBER (A) 03/09/2017 1654   APPEARANCEUR HAZY (A) 03/09/2017 1654   APPEARANCEUR Clear 11/22/2015 1514   LABSPEC 1.014 03/09/2017 1654   LABSPEC 1.008 05/28/2012 1120   PHURINE 5.0 03/09/2017 1654   GLUCOSEU NEGATIVE 03/09/2017 1654   GLUCOSEU Negative 05/28/2012 1120   HGBUR SMALL (A) 03/09/2017 1654   BILIRUBINUR NEGATIVE 03/09/2017 1654   BILIRUBINUR Negative 11/22/2015 1514   BILIRUBINUR Negative  05/28/2012 [place] 03/09/2017 1654   PROTEINUR 30 (A) 03/09/2017 1654   NITRITE NEGATIVE 03/09/2017 1654   LEUKOCYTESUR NEGATIVE 03/09/2017 1654   LEUKOCYTESUR Negative 11/22/2015 1514   LEUKOCYTESUR Negative 05/28/2012 1120   Sepsis Labs: @LABRCNTIP (procalcitonin:4,lacticacidven:4)  ) Recent Results (from the past 240 hour(s))  SARS CORONAVIRUS 2 (TAT 6-24 HRS) Nasopharyngeal Nasopharyngeal Swab     Status: None   Collection Time: 04/14/19  3:35 AM   Specimen: Nasopharyngeal Swab  Result Value Ref Range Status   SARS Coronavirus 2 NEGATIVE NEGATIVE Final    Comment: (NOTE) SARS-CoV-2 target nucleic acids are NOT DETECTED. The SARS-CoV-2 RNA is generally detectable in upper and lower respiratory specimens during the acute phase of infection. Negative results do not preclude SARS-CoV-2 infection, do not rule out co-infections with other pathogens, and should not be used  as the sole basis for treatment or other patient management decisions. Negative results must be combined with clinical observations, patient history, and epidemiological information. The expected result is Negative. Fact Sheet for Patients: SugarRoll.be Fact Sheet for Healthcare Providers: https://www.woods-mathews.com/ This test is not yet approved or cleared by the [place] FDA and  has been authorized for detection and/or diagnosis of SARS-CoV-2 by FDA under an Emergency Use Authorization (EUA). This EUA will remain  in effect (meaning this test can be used) for the duration of the COVID-19 declaration under Section 56 4(b)(1) of the Act, 21 U.S.C. section 360bbb-3(b)(1), unless the authorization is terminated or revoked sooner. Performed at [facility], [place] [address], [place],  [address]          Radiology Studies: Dg Hip Operative Unilat W Or W/o Pelvis Left  Result Date: 04/14/2019 CLINICAL DATA:  Intraoperative left hip fluoroscopy EXAM: OPERATIVE LEFT HIP WITH PELVIS COMPARISON:  CT hip 04/14/2019, radiographs 04/14/2019 FINDINGS: Sequential intraoperative fluoroscopic views demonstrate placement of 3 partially threaded, cannulated screws transfixing the subcapital left femoral neck fracture seen on comparison radiographs. No evidence of acute hardware complication. Alignment is near anatomic on these nonstandard views. Soft tissues poorly evaluated in the limited fluoroscopic collimation. IMPRESSION: Intraoperative fluoroscopic views demonstrating 3 partially threaded, cannulated screws transfixing the subcapital left femoral neck fracture. No evidence of acute hardware complication. Electronically Signed   By: Lovena Le M.D.   On: 04/14/2019 13:51        Scheduled Meds: . acetaminophen  1,000 mg Oral TID  . amiodarone  200 mg Oral Daily  . aspirin EC  81 mg Oral Daily  . Chlorhexidine Gluconate Cloth  6  each Topical Q0600  . docusate sodium  100 mg Oral BID  . feeding supplement (NEPRO CARB STEADY)  237 mL Oral BID BM  . gentamicin cream  1 application Topical Daily  . heparin injection (subcutaneous)  5,000 Units Subcutaneous Q8H  . levothyroxine  50 mcg Oral Daily  . midodrine  10 mg Oral TID  . multivitamin  1 tablet Oral Daily  . tamsulosin  0.4 mg Oral QPM   Continuous Infusions: . dialysis solution 1.5% low-MG/low-CA       LOS: 2 days    Time spent: 25 minutes    Edwin Dada, MD [facility] 04/16/2019, 1:41 PM     Please page though [place] or Epic secure chat:  For password, contact charge nurse

## 2019-04-17 NOTE — Progress Notes (Signed)
  Subjective: 3 Days Post-Op Procedure(s) (LRB): CANNULATED HIP PINNING, LEFT (Left) Patient reports pain as mild.   Patient seen in rounds with Dr. Marry Guan. Patient is well, and has had no acute complaints or problems Plan is to go Home after hospital stay. Negative for chest pain and shortness of breath Fever: no Gastrointestinal: Negative for nausea and vomiting  Objective: Vital signs in last 24 hours: Pulse Rate:  [65] 65 (11/20 0845) Resp:  [17] 17 (11/20 0845) BP: (129)/(72) 129/72 (11/20 0845) SpO2:  [95 %] 95 % (11/20 0845)  Intake/Output from previous day:  Intake/Output Summary (Last 24 hours) at 04/17/2019 0624 Last data filed at 04/16/2019 1829 Gross per 24 hour  Intake 6480 ml  Output 139 ml  Net 6341 ml    Intake/Output this shift: No intake/output data recorded.  Labs: Recent Labs    04/15/19 0506 04/16/19 0403  HGB 11.8* 10.9*   Recent Labs    04/15/19 0506 04/16/19 0403  WBC 10.3 6.9  RBC 3.74* 3.34*  HCT 37.5* 33.5*  PLT 162 150   Recent Labs    04/15/19 0506 04/16/19 0403  NA 142 141  K 4.1 3.7  CL 106 108  CO2 25 25  BUN 35* 34*  CREATININE 2.92* 2.71*  GLUCOSE 160* 114*  CALCIUM 8.3* 8.0*   No results for input(s): LABPT, INR in the last 72 hours.   EXAM General - Patient is Alert and Confused Extremity - Sensation intact distally Dorsiflexion/Plantar flexion intact Compartment soft Dressing/Incision - clean, dry and scant blood-tinged drainage  Motor Function - intact, moving foot and toes well on exam.  The patient ambulated 80 feet with physical therapy  Past Medical History:  Diagnosis Date  . Acute pulmonary edema ([place]) 02/26/2013  . Anxiety   . Arthritis    little  . Benign prostatic hyperplasia with urinary obstruction 02/24/2012  . BP (high blood pressure) 10/31/2015  . Cataract extraction status of left eye   . Cataract extraction status of right eye   . Cerebrovascular accident (CVA) ([place]) 04/24/2012   affected right eye, limited peripheral vision  . Chronic kidney disease 07/25/2017   stage 3  . Congestive heart failure ([place]) 10/31/2015  . Dyspnea    uses continuous o2  . Dysrhythmia    AF  . HLD (hyperlipidemia) 10/31/2015  . Hydrocele 02/25/2012  . Myocardial infarction ([place])    per patient, small MI  . Renal insufficiency   . Unstable angina pectoris ([place]) 02/26/2013  . Urge incontinence of urine 05/30/2014  . Urinary urgency 10/31/2015    Assessment/Plan: 3 Days Post-Op Procedure(s) (LRB): CANNULATED HIP PINNING, LEFT (Left) Principal Problem:   Closed left hip fracture, initial encounter [facility]) Active Problems:   Congestive heart failure (HCC)   ESRD on dialysis [facility])   Atrial fibrillation, chronic (HCC)  Estimated body mass index is 18.95 kg/m as calculated from the following:   Height as of this encounter: 5\' 10"  (1.778 m).   Weight as of this encounter: 59.9 kg.  Advance diet. Physical therapy.  Discharge planning to go home.  Plan for Monday.  Awaiting medical clearance Follow-up at [facility] clinic in 2 weeks after discharge for staple removal.  DVT Prophylaxis - Foot Pumps, TED hose and Heparin Weight-Bearing as tolerated to left leg  Reche Dixon, PA-C Orthopaedic Surgery 04/17/2019, 6:24 AM

## 2019-04-17 NOTE — Progress Notes (Signed)
Physical Therapy Treatment Patient Details Name: Ruben Hudson MRN: [id number] DOB: August 23, 1925 Today's Date: 04/17/2019    History of Present Illness Pt is a 85 y.o. male presenting to hospital 04/14/19 s/p fall opening storm door with L hip pain.  Imaging showing nondisplaced L femoral neck fx and s/p L cannulated hip pinning 04/14/19.  PMH includes ESRD on nightly peritoneal dialysis, cardiomyopathy with reduced EF, home O2, CVA, R eye with limited peripheral vision, CKD stage 3, CHF, dysrhythmia, MI, a-fib wit RVR, h/o R ankle fx surgery, 2 L home O2 chronic.    PT Comments    Patient is in chair upon PT arrival and eager to participate in therapy. He is challenged with proper use of RW frequently pushing it too far in front of him or stepping outside of support base requiring continued cueing for safety. This afternoon he demonstrated carryover from morning session education on safety with turning with continued practice turning with RW while keeping feet in proper positioning within walker for stability. Patient became distracted when re-entering room causing him to let go of walker and experiencing a near LOB, PT holding gate belt re-shifted patient to COM and kept patient upright. Patient ambulated back to chair and performed seated exercises after. Current POC remains appropriate.     Follow Up Recommendations  Home health PT;Supervision/Assistance - 24 hour     Equipment Recommendations  Rolling walker with 5" wheels;3in1 (PT)    Recommendations for Other Services OT consult     Precautions / Restrictions Precautions Precautions: Fall Precaution Comments: peritoneal dialysis Restrictions Weight Bearing Restrictions: Yes LLE Weight Bearing: Weight bearing as tolerated    Mobility  Bed Mobility                  Transfers Overall transfer level: Needs assistance   Transfers: Sit to/from Stand Sit to Stand: Min guard         General transfer comment:  increased effort to stand up to RW; vc's for UE/LE placement  Ambulation/Gait Ambulation/Gait assistance: Min guard Gait Distance (Feet): 92 Feet Assistive device: Rolling walker (2 wheeled)   Gait velocity: decreased   General Gait Details: One near LOB requiring Min/Mod A to stabilize self. antalgic gait pattern with decreased weight acceptance and stance phase on LLE, heavy use of UE support when stepping with LLE. Requires cueing for walker placement   Stairs             Wheelchair Mobility    Modified Rankin (Stroke Patients Only)       Balance Overall balance assessment: Needs assistance Sitting-balance support: No upper extremity supported;Feet supported Sitting balance-Leahy Scale: Good Sitting balance - Comments: steady sitting reaching within BOS, pain reaching outside BOS   Standing balance support: Bilateral upper extremity supported   Standing balance comment: SUE support static standing, requires RW for mobility; one near LOB                            Cognition Arousal/Alertness: Awake/alert Behavior During Therapy: Impulsive Overall Cognitive Status: History of cognitive impairments - at baseline                                 General Comments: Oriented to person and place and situation; generalized confusion noted, intermittent mood changes      Exercises Total Joint Exercises Ankle Circles/Pumps: AROM;Strengthening;Both;10 reps;Supine Quad Sets: AROM;Strengthening;Both;10  reps;Supine Heel Slides: AAROM;Strengthening;Left;10 reps;Supine Long Arc Quad: Strengthening;Both;10 reps;Seated Other Exercises Other Exercises: gait mechanics and safety awareness with use of walker, education on safety awareness and need for two hands on walker with feet close to walker. Education and performance of turning with RW with focus on feet within walker x 3.    General Comments General comments (skin integrity, edema, etc.): no  noticeable bruising.      Pertinent Vitals/Pain Pain Assessment: 0-10 Pain Score: 5  Pain Location: L hip/thigh Pain Descriptors / Indicators: Aching;Sore;Tender;Operative site guarding Pain Intervention(s): Limited activity within patient's tolerance;Repositioned;Monitored during session    Home Living                      Prior Function            PT Goals (current goals can now be found in the care plan section) Acute Rehab PT Goals Patient Stated Goal: to improve mobility and have less pain PT Goal Formulation: With patient/family Time For Goal Achievement: 04/29/19 Potential to Achieve Goals: Good Progress towards PT goals: Progressing toward goals    Frequency    BID      PT Plan Current plan remains appropriate    Co-evaluation              AM-PAC PT "6 Clicks" Mobility   Outcome Measure  Help needed turning from your back to your side while in a flat bed without using bedrails?: A Lot Help needed moving from lying on your back to sitting on the side of a flat bed without using bedrails?: A Lot Help needed moving to and from a bed to a chair (including a wheelchair)?: A Little Help needed standing up from a chair using your arms (e.g., wheelchair or bedside chair)?: A Little Help needed to walk in hospital room?: A Little Help needed climbing 3-5 steps with a railing? : A Lot 6 Click Score: 15    End of Session Equipment Utilized During Treatment: Gait belt Activity Tolerance: Patient limited by pain;Patient limited by fatigue;Patient tolerated treatment well Patient left: with call bell/phone within reach;with family/visitor present;in chair;with chair alarm set(B heels floating via pillow; bed in lowest position) Nurse Communication: Mobility status;Precautions;Weight bearing status(pt's pain status) PT Visit Diagnosis: Other abnormalities of gait and mobility (R26.89);Muscle weakness (generalized) (M62.81);History of falling  (Z91.81);Difficulty in walking, not elsewhere classified (R26.2);Pain Pain - Right/Left: Left Pain - part of body: Hip     Time: [id number] PT Time Calculation (min) (ACUTE ONLY): 27 min  Charges:  $Gait Training: 8-22 mins $Therapeutic Exercise: 8-22 mins                    Janna Arch, PT, DPT    Janna Arch 04/17/2019, 3:52 PM

## 2019-04-17 NOTE — Progress Notes (Signed)
Physical Therapy Treatment Patient Details Name: Ruben Hudson MRN: [id number] DOB: 1926/03/07 Today's Date: 04/17/2019    History of Present Illness Pt is a 85 y.o. male presenting to hospital 04/14/19 s/p fall opening storm door with L hip pain.  Imaging showing nondisplaced L femoral neck fx and s/p L cannulated hip pinning 04/14/19.  PMH includes ESRD on nightly peritoneal dialysis, cardiomyopathy with reduced EF, home O2, CVA, R eye with limited peripheral vision, CKD stage 3, CHF, dysrhythmia, MI, a-fib wit RVR, h/o R ankle fx surgery, 2 L home O2 chronic.    PT Comments    Patient is up in chair with wife present upon PT arrival in room. Patient had just received coffee but agreeable to PT. Oxygen is not on patient upon PT arrival, placed back on patient prior to mobility. Patient requires CGA and cueing for safe transfers however is able to perform with use of RW. Ambulation in hallway requires cueing for safety awareness, placement/proper use of RW for stabilization and forward momentum, and task orientation. Patient returned to chair after ambulation to finish his coffee with wife present.  Current POC remains appropriate at this time.    Follow Up Recommendations  Home health PT;Supervision/Assistance - 24 hour     Equipment Recommendations  Rolling walker with 5" wheels;3in1 (PT)    Recommendations for Other Services OT consult     Precautions / Restrictions Precautions Precautions: Fall Precaution Comments: peritoneal dialysis Restrictions Weight Bearing Restrictions: Yes LLE Weight Bearing: Weight bearing as tolerated    Mobility  Bed Mobility                  Transfers Overall transfer level: Needs assistance   Transfers: Sit to/from Stand Sit to Stand: Min guard         General transfer comment: increased effort to stand up to RW; vc's for UE/LE placement  Ambulation/Gait Ambulation/Gait assistance: Min guard Gait Distance (Feet): 85  Feet Assistive device: Rolling walker (2 wheeled)   Gait velocity: decreased   General Gait Details: antalgic gait pattern with decreased weight acceptance and stance phase on LLE, heavy use of UE support when stepping with LLE. Requires cueing for walker placement   Stairs             Wheelchair Mobility    Modified Rankin (Stroke Patients Only)       Balance Overall balance assessment: Needs assistance Sitting-balance support: No upper extremity supported;Feet supported Sitting balance-Leahy Scale: Good Sitting balance - Comments: steady sitting reaching within BOS, pain reaching outside BOS   Standing balance support: Bilateral upper extremity supported   Standing balance comment: SUE support static standing, requires RW for mobility                            Cognition Arousal/Alertness: Awake/alert Behavior During Therapy: Impulsive Overall Cognitive Status: History of cognitive impairments - at baseline                                 General Comments: Oriented to person and place and situation; generalized confusion noted, intermittent mood changes      Exercises Total Joint Exercises Ankle Circles/Pumps: AROM;Strengthening;Both;10 reps;Supine Quad Sets: AROM;Strengthening;Both;10 reps;Supine Heel Slides: AAROM;Strengthening;Left;10 reps;Supine Long Arc Quad: Strengthening;Both;10 reps;Seated Other Exercises Other Exercises: gait mechanics and safety awareness with use of walker    General Comments General comments (skin integrity,  edema, etc.): no noticeable bruising.      Pertinent Vitals/Pain Pain Assessment: 0-10 Pain Score: 6  Pain Location: L hip/thigh Pain Descriptors / Indicators: Aching;Sore;Tender;Operative site guarding Pain Intervention(s): Limited activity within patient's tolerance;Monitored during session;Repositioned    Home Living                      Prior Function            PT Goals  (current goals can now be found in the care plan section) Acute Rehab PT Goals Patient Stated Goal: to improve mobility and have less pain PT Goal Formulation: With patient/family Time For Goal Achievement: 04/29/19 Potential to Achieve Goals: Good Progress towards PT goals: Progressing toward goals    Frequency    BID      PT Plan Current plan remains appropriate    Co-evaluation              AM-PAC PT "6 Clicks" Mobility   Outcome Measure  Help needed turning from your back to your side while in a flat bed without using bedrails?: A Lot Help needed moving from lying on your back to sitting on the side of a flat bed without using bedrails?: A Lot Help needed moving to and from a bed to a chair (including a wheelchair)?: A Little Help needed standing up from a chair using your arms (e.g., wheelchair or bedside chair)?: A Little Help needed to walk in hospital room?: A Little Help needed climbing 3-5 steps with a railing? : A Lot 6 Click Score: 15    End of Session Equipment Utilized During Treatment: Gait belt Activity Tolerance: Patient limited by pain;Patient limited by fatigue;Patient tolerated treatment well Patient left: with call bell/phone within reach;with family/visitor present;in chair;with chair alarm set(B heels floating via pillow; bed in lowest position) Nurse Communication: Mobility status;Precautions;Weight bearing status(pt's pain status) PT Visit Diagnosis: Other abnormalities of gait and mobility (R26.89);Muscle weakness (generalized) (M62.81);History of falling (Z91.81);Difficulty in walking, not elsewhere classified (R26.2);Pain Pain - Right/Left: Left Pain - part of body: Hip     Time: [id number] PT Time Calculation (min) (ACUTE ONLY): 18 min  Charges:  $Therapeutic Exercise: 8-22 mins                    Janna Arch, PT, DPT     Janna Arch 04/17/2019, 1:17 PM

## 2019-04-17 NOTE — Progress Notes (Signed)
Ruben Hudson  MRN: [id number]  DOB/AGE: 11/11/1925 85 y.o.  Primary Care Physician:Anderson, Ocie Cornfield, MD  Admit date: 04/14/2019  Chief Complaint:  Chief Complaint  Patient presents with  . Hip Pain    S-Pt presented on  04/14/2019 with  Chief Complaint  Patient presents with  . Hip Pain  .    Pt today feels better. Pt offers no new complaints.    Pt main comment to me was " if I can go home today ?  Medications . acetaminophen  1,000 mg Oral TID  . amiodarone  200 mg Oral Daily  . aspirin EC  81 mg Oral Daily  . Chlorhexidine Gluconate Cloth  6 each Topical Q0600  . docusate sodium  100 mg Oral BID  . feeding supplement (NEPRO CARB STEADY)  237 mL Oral BID BM  . gentamicin cream  1 application Topical Daily  . heparin injection (subcutaneous)  5,000 Units Subcutaneous Q8H  . levothyroxine  50 mcg Oral Daily  . midodrine  10 mg Oral TID  . multivitamin  1 tablet Oral Daily  . tamsulosin  0.4 mg Oral QPM  . torsemide  20 mg Oral Daily         [id number] from the symptoms mentioned above,there are no other symptoms referable to all systems reviewed.  Physical Exam: Vital signs in last 24 hours: Temp:  [97.8 F (36.6 C)] 97.8 F (36.6 C) (11/21 0835) Pulse Rate:  [57] 57 (11/21 0835) Resp:  [16] 16 (11/21 0835) BP: (120)/(53) 120/53 (11/21 0835) SpO2:  [99 %] 99 % (11/21 0835) Weight change:  Last BM Date: 04/15/19  Intake/Output from previous day: 11/20 0701 - 11/21 0700 In: 6480 [P.O.:480] Out: 139  Total I/O In: -  Out: 128 [Other:128]   Physical Exam: General- pt is awake,alert, oriented to time place and person Resp- No acute REsp distress, CTA B/L NO Rhonchi CVS- S1S2 regular in rate and rhythm GIT- BS+, soft, NT, ND,  EXT- NO LE Edema, NO Cyanosis Access- PD cath in situ   Lab Results: CBC Recent Labs    04/15/19 0506 04/16/19 0403  WBC 10.3 6.9  HGB 11.8* 10.9*  HCT 37.5* 33.5*  PLT 162 150    BMET Recent Labs   04/15/19 0506 04/16/19 0403  NA 142 141  K 4.1 3.7  CL 106 108  CO2 25 25  GLUCOSE 160* 114*  BUN 35* 34*  CREATININE 2.92* 2.71*  CALCIUM 8.3* 8.0*    MICRO Recent Results (from the past 240 hour(s))  SARS CORONAVIRUS 2 (TAT 6-24 HRS) Nasopharyngeal Nasopharyngeal Swab     Status: None   Collection Time: 04/14/19  3:35 AM   Specimen: Nasopharyngeal Swab  Result Value Ref Range Status   SARS Coronavirus 2 NEGATIVE NEGATIVE Final    Comment: (NOTE) SARS-CoV-2 target nucleic acids are NOT DETECTED. The SARS-CoV-2 RNA is generally detectable in upper and lower respiratory specimens during the acute phase of infection. Negative results do not preclude SARS-CoV-2 infection, do not rule out co-infections with other pathogens, and should not be used as the sole basis for treatment or other patient management decisions. Negative results must be combined with clinical observations, patient history, and epidemiological information. The expected result is Negative. Fact Sheet for Patients: SugarRoll.be Fact Sheet for Healthcare Providers: https://www.woods-mathews.com/ This test is not yet approved or cleared by the [place] FDA and  has been authorized for detection and/or diagnosis of SARS-CoV-2 by FDA under an Emergency Use  Authorization (EUA). This EUA will remain  in effect (meaning this test can be used) for the duration of the COVID-19 declaration under Section 56 4(b)(1) of the Act, 21 U.S.C. section 360bbb-3(b)(1), unless the authorization is terminated or revoked sooner. Performed at [facility], [place] [address] [place], [place] [address]       Lab Results  Component Value Date   PTH 56 12/03/2017   CALCIUM 8.0 (L) 04/16/2019   PHOS 3.6 04/16/2019               Impression:  85 y.o. male with end-stage renal disease on peritoneal dialysis, atrial fibrillation, history of GI bleed, cardiomyopathy EF  20% 2019, hypothyroidism, history of stroke presents to hospital with left hip fracture after a fall-status post pinning April 14, 2019 Dr. Rudene Christians.    1)Renal ESRD Patient is on peritoneal dialysis. As outpatient Patient has dry weight of 62 kg Patient has total fill volume in 24 hours of 5.7 L. Patient is on PD with cycler Patient has 3 cycles with 1900 volume each  As inpatient We will continue patient on 2 L for 3 cycles Will use 1.5% bags  2)HTN Patient blood pressure is at goal   3)Anemia of chronic disease  HGb at goal (9--11) No need of Epogen as hemoglobin is at goal  4) secondary hyper parathyroidism CKD Mineral-Bone Disorder Patient had high phosphorus earlier Patient phosphorus is currently at goal No need for binders for now Calcium is at goal  5) left hip fracture Patient is s/p pinning on April 14, 2019 Plan was to send patient to SNF but no peritoneal dialysis was available at [facility].  And patient and the family were reluctant to start patient on hemodialysis as patient had done poorly with hemodialysis in the past. Discharge planning is currently working on discharging home with home health   6) electrolytes  normokalemic NOrmonatremic   7)Acid base Co2 at goal     Plan:  Will dialyze pt tonight We will continue patient on 2 L for 3 cycles Will use 1.5% bags    Atiyah Bauer s [place] [address] 04/17/2019, 9:32 AM

## 2019-04-17 NOTE — Progress Notes (Signed)
PROGRESS NOTE    Ruben Hudson  [id number] DOB: 1925-06-16 DOA: 04/14/2019 PCP: Ruben Ruths, MD      Brief Narrative:  Ruben Hudson is a 85 y.o. male with history of A. fib not on anticoagulation secondary to GI bleed, cardiomyopathy last EF measured was 20% in 2019, ESRD on peritoneal dialysis for last 2 years, hypothyroidism, history of stroke with partial vision loss in the right eye on aspirin had a fall at home when he was trying to open the storm door.  Had a mechanical fall after blowing leaves in yard.  Had immediate hip pain.  Did not lose consciousness. Denied any chest pain or shortness of breath.  In the ER CT left hip showed left hip fracture.  On-call orthopedic surgeon and nephrologist were consulted.  Labs revealed WBC of 15.3 potassium 4.3 bicarb 28.  EKG showed old LBBB.         Assessment & Plan:  Acute left hip fracture S/p cannulated pinning of the left hip by Dr. Rudene Christians -Consult orthopedics, appreciate recommendations -Continue Tylenol for pain control -Continue oxycodone as needed   Delirium The patient has some mild confusion behavior at times and an altered sleep-wake cycle as result of his dementia and recent anesthesia. Delirium precautions:   -Lights and TV off, minimize interruptions at night  -Blinds open and lights on during day  -Glasses/hearing aid with patient  -Frequent reorientation  -PT/OT when able  -Avoid sedation medications/Beers list medications   Atrial fibrillation, chronic Not on anticoagulation due to GI bleed.  Rate controlled -Continue amiodarone    ESRD on PD -Consult nephrology to manage his routine dialysis -Continue every other day home torsemide  Chronic systolic CHF LBBB EF [id number] in 2019.  Appears euvolemic.  Hypothyroidism -Continue levothyroxine  History of stroke -Continue aspirin  Prolonged QT interval ruled out LBBB corrected QTc is 450.  Recurrent syncope -Continue midodrine  Moderate  protein calorie malnutrition -Continue Nepro  Acute blood loss anemia on chronic anemia of CKD Stable, expected postoperative anemia       DVT prophylaxis: Heparin Code Status: Full code Family Communication: Ruben Hudson MDM and disposition Plan:  The below labs and imaging reports reviewed and summarized above.  Medication management as above.   The patient was admitted with hip fracture.  He underwent uncomplicated hip fracture repair 11/18.    At present, he has a significant medical complexity (requires daily peritoneal dialysis, also has some dementia and associated confused behavior at times that require daily management by rehabilitation MD), but he is extremely functional at baseline, ambulates without a walker, and is independent for self-cares, lives independently with his Ruben Hudson and is very motivated to improve and return home, where he lives with his Ruben Hudson, and has very good social support from a son nearby.    He continues to require substantial physical and occupational therapy to return to his prior level of function and I suspect he can tolerate several hours physical therapy per day as well as occupational therapy from when she would benefit in order to return home in appropriate manner.   We will continue in-house physical therapy as we make arrangements for post discharge, but I presently do not have a safe discharge plan       Objective: Vitals:   04/15/19 1652 04/16/19 0331 04/16/19 0845 04/17/19 0835  BP: (!) 103/59 128/70 129/72 (!) 120/53  Pulse: 60 65 65 (!) 57  Resp: 18 17 17 16   Temp: 97.7 F (36.5  C) 98.1 F (36.7 C)  97.8 F (36.6 C)  TempSrc: Oral Oral  Oral  SpO2: 100% 99% 95% 99%  Weight:      Height:        Intake/Output Summary (Last 24 hours) at 04/17/2019 1319 Last data filed at 04/17/2019 0946 Gross per 24 hour  Intake 840 ml  Output 128 ml  Net 712 ml   Filed Weights   04/14/19 0034 04/14/19 1152  Weight: 59.9 kg 59.9 kg     Examination: General appearance: Thin elderly adult male, alert and in no acute distress.  Sitting in recliner, interactive HEENT: Anicteric, conjunctiva pink, lids and lashes normal. No nasal deformity, discharge, epistaxis.  Lips moist, teeth normal. OP moist, no oral lesions.  Hearing diminished Skin: Warm and dry.  No suspicious rashes or lesions.  Some blood on his dressing, crusted dry Cardiac: RRR, no murmurs appreciated.  JVP normal, no LE edema.    Respiratory: Normal respiratory rate and rhythm.  CTAB without rales or wheezes. Abdomen: Abdomen soft.  No tenderness palpation or guarding. No ascites, distension, hepatosplenomegaly.   MSK: No deformities or effusions of the large joints of the upper or lower extremities bilaterally.  Diffuse loss of subcutaneous muscle mass and fat Neuro: Awake and alert. Naming is grossly intact, and the patient's recall, recent and remote, as well as general fund of knowledge seem moderately impaired by dementia.  Muscle tone decreased, without fasciculations.  Balance very unsteady with standing.  Moves all extremities equally but with generalized weakness, limited by pain severely. Speech fluent.    Psych: Sensorium intact and responding to questions, attention normal. Affect pleasant.  Judgment and insight appear impaired by dementia.       Data Reviewed: I have personally reviewed following labs and imaging studies:  CBC: Recent Labs  Lab 04/14/19 0223 04/15/19 0506 04/16/19 0403  WBC 15.3* 10.3 6.9  NEUTROABS 14.0*  --   --   HGB 13.8 11.8* 10.9*  HCT 42.5 37.5* 33.5*  MCV 98.8 100.3* 100.3*  PLT 186 162 [id number]   Basic Metabolic Panel: Recent Labs  Lab 04/14/19 0223 04/15/19 0506 04/16/19 0403  NA 142 142 141  K 4.3 4.1 3.7  CL 103 106 108  CO2 28 25 25   GLUCOSE 136* 160* 114*  BUN 41* 35* 34*  CREATININE 3.53* 2.92* 2.71*  CALCIUM 8.6* 8.3* 8.0*  PHOS  --   --  3.6   GFR: Estimated Creatinine Clearance: 15 mL/min (A)  (by C-G formula based on SCr of 2.71 mg/dL (H)). Liver Function Tests: Recent Labs  Lab 04/16/19 0403  ALBUMIN 2.7*   No results for input(s): LIPASE, AMYLASE in the last 168 hours. No results for input(s): AMMONIA in the last 168 hours. Coagulation Profile: No results for input(s): INR, PROTIME in the last 168 hours. Cardiac Enzymes: No results for input(s): CKTOTAL, CKMB, CKMBINDEX, TROPONINI in the last 168 hours. BNP (last 3 results) No results for input(s): PROBNP in the last 8760 hours. HbA1C: No results for input(s): HGBA1C in the last 72 hours. CBG: No results for input(s): GLUCAP in the last 168 hours. Lipid Profile: No results for input(s): CHOL, HDL, LDLCALC, TRIG, CHOLHDL, LDLDIRECT in the last 72 hours. Thyroid Function Tests: No results for input(s): TSH, T4TOTAL, FREET4, T3FREE, THYROIDAB in the last 72 hours. Anemia Panel: No results for input(s): VITAMINB12, FOLATE, FERRITIN, TIBC, IRON, RETICCTPCT in the last 72 hours. Urine analysis:    Component Value Date/Time   COLORURINE AMBER (  A) 03/09/2017 1654   APPEARANCEUR HAZY (A) 03/09/2017 1654   APPEARANCEUR Clear 11/22/2015 1514   LABSPEC 1.014 03/09/2017 1654   LABSPEC 1.008 05/28/2012 1120   PHURINE 5.0 03/09/2017 1654   GLUCOSEU NEGATIVE 03/09/2017 1654   GLUCOSEU Negative 05/28/2012 1120   HGBUR SMALL (A) 03/09/2017 1654   BILIRUBINUR NEGATIVE 03/09/2017 1654   BILIRUBINUR Negative 11/22/2015 1514   BILIRUBINUR Negative 05/28/2012 1120   KETONESUR NEGATIVE 03/09/2017 1654   PROTEINUR 30 (A) 03/09/2017 1654   NITRITE NEGATIVE 03/09/2017 1654   LEUKOCYTESUR NEGATIVE 03/09/2017 1654   LEUKOCYTESUR Negative 11/22/2015 1514   LEUKOCYTESUR Negative 05/28/2012 1120   Sepsis Labs: @LABRCNTIP (procalcitonin:4,lacticacidven:4)  ) Recent Results (from the past 240 hour(s))  SARS CORONAVIRUS 2 (TAT 6-24 HRS) Nasopharyngeal Nasopharyngeal Swab     Status: None   Collection Time: 04/14/19  3:35 AM    Specimen: Nasopharyngeal Swab  Result Value Ref Range Status   SARS Coronavirus 2 NEGATIVE NEGATIVE Final    Comment: (NOTE) SARS-CoV-2 target nucleic acids are NOT DETECTED. The SARS-CoV-2 RNA is generally detectable in upper and lower respiratory specimens during the acute phase of infection. Negative results do not preclude SARS-CoV-2 infection, do not rule out co-infections with other pathogens, and should not be used as the sole basis for treatment or other patient management decisions. Negative results must be combined with clinical observations, patient history, and epidemiological information. The expected result is Negative. Fact Sheet for Patients: SugarRoll.be Fact Sheet for Healthcare Providers: https://www.woods-mathews.com/ This test is not yet approved or cleared by the [place] FDA and  has been authorized for detection and/or diagnosis of SARS-CoV-2 by FDA under an Emergency Use Authorization (EUA). This EUA will remain  in effect (meaning this test can be used) for the duration of the COVID-19 declaration under Section 56 4(b)(1) of the Act, 21 U.S.C. section 360bbb-3(b)(1), unless the authorization is terminated or revoked sooner. Performed at [facility], [place] [address], [place], [place] [address]          Radiology Studies: Dg Chest Port 1 View  Result Date: 04/16/2019 CLINICAL DATA:  Productive cough. EXAM: PORTABLE CHEST 1 VIEW COMPARISON:  04/14/2019. FINDINGS: Trachea is midline. Heart is enlarged, stable. Thoracic aorta is calcified. Streaky opacities in both lung bases. Small bilateral pleural effusions appear partially loculated. IMPRESSION: 1. Small bilateral pleural effusions appear partially loculated. 2. Bibasilar atelectasis. Electronically Signed   By: Lorin Picket M.D.   On: 04/16/2019 15:34        Scheduled Meds: . acetaminophen  1,000 mg Oral TID  . amiodarone  200 mg Oral Daily   . aspirin EC  81 mg Oral Daily  . Chlorhexidine Gluconate Cloth  6 each Topical Q0600  . docusate sodium  100 mg Oral BID  . feeding supplement (NEPRO CARB STEADY)  237 mL Oral BID BM  . gentamicin cream  1 application Topical Daily  . heparin injection (subcutaneous)  5,000 Units Subcutaneous Q8H  . levothyroxine  50 mcg Oral Daily  . midodrine  10 mg Oral TID  . multivitamin  1 tablet Oral Daily  . tamsulosin  0.4 mg Oral QPM  . torsemide  20 mg Oral Daily   Continuous Infusions: . dialysis solution 1.5% low-MG/low-CA       LOS: 3 days    Time spent: 25 minutes    Edwin Dada, MD [facility] 04/17/2019, 1:19 PM     Please page though [place] or Epic secure chat:  For  password, contact charge nurse

## 2019-04-17 NOTE — Progress Notes (Signed)
PT Cancellation Note  Patient Details Name: Ruben Hudson MRN: [id number] DOB: 14-Aug-1925   Cancelled Treatment:    Reason Eval/Treat Not Completed: Other (comment)(Patient just received breakfast. Wife present, not pleased with PT arrival at same time as breakfast arrival. Will attempt again at later time/date.)  Janna Arch, PT, DPT   04/17/2019, 8:53 AM

## 2019-04-17 NOTE — Progress Notes (Signed)
Pd completed 

## 2019-04-18 LAB — RENAL FUNCTION PANEL
Albumin: 3 g/dL — ABNORMAL LOW (ref 3.5–5.0)
Anion gap: 10 (ref 5–15)
BUN: 39 mg/dL — ABNORMAL HIGH (ref 8–23)
CO2: 29 mmol/L (ref 22–32)
Calcium: 8.4 mg/dL — ABNORMAL LOW (ref 8.9–10.3)
Chloride: 102 mmol/L (ref 98–111)
Creatinine, Ser: 2.78 mg/dL — ABNORMAL HIGH (ref 0.61–1.24)
GFR calc Af Amer: 22 mL/min — ABNORMAL LOW (ref >60)
GFR calc non Af Amer: 19 mL/min — ABNORMAL LOW (ref >60)
Glucose, Bld: 109 mg/dL — ABNORMAL HIGH (ref 70–99)
Phosphorus: 3.7 mg/dL (ref 2.5–4.6)
Potassium: 3.8 mmol/L (ref 3.5–5.1)
Sodium: 141 mmol/L (ref 135–145)

## 2019-04-18 LAB — CBC
HCT: 35.2 % — ABNORMAL LOW (ref 39.0–52.0)
Hemoglobin: 11.4 g/dL — ABNORMAL LOW (ref 13.0–17.0)
MCH: 32.5 pg (ref 26.0–34.0)
MCHC: 32.4 g/dL (ref 30.0–36.0)
MCV: 100.3 fL — ABNORMAL HIGH (ref 80.0–100.0)
Platelets: 191 10*3/uL (ref 150–400)
RBC: 3.51 MIL/uL — ABNORMAL LOW (ref 4.22–5.81)
RDW: 14.6 % (ref 11.5–15.5)
WBC: 6.7 10*3/uL (ref 4.0–10.5)
nRBC: 0 % (ref 0.0–0.2)

## 2019-04-18 MED ORDER — OXYCODONE HCL 5 MG PO TABS: 5.0000 mg | ORAL_TABLET | Freq: Four times a day (QID) | ORAL | 0 refills | Status: AC | PRN

## 2019-04-18 NOTE — Progress Notes (Signed)
   04/18/19 1145  Neurological  Level of Consciousness Alert  Orientation Level Oriented to person;Oriented to situation  Respiratory  Respiratory Pattern Regular;Unlabored  Bilateral Breath Sounds Clear  Cardiac  Pulse Regular  Peritoneal Catheter Left lower abdomen  No Placement Date or Time found.   Catheter Location: Left lower abdomen  Site Assessment Clean;Dry;Intact  Drainage Description None  Catheter status Deaccessed;Capped  Dressing Gauze/Drain sponge  Dressing Status Clean;Dry;Intact  pt sitting up in chair with wife at chairside...stable this AM no c/os voiced catheter WDL UFG 229ml

## 2019-04-18 NOTE — Progress Notes (Signed)
Ruben Hudson  MRN: [id number]  DOB/AGE: 83-Oct-1927 83 y.o.  Primary Care Physician:Anderson, Ocie Cornfield, MD  Admit date: 04/14/2019  Chief Complaint:  Chief Complaint  Patient presents with  . Hip Pain    S-Pt presented on  04/14/2019 with  Chief Complaint  Patient presents with  . Hip Pain  .   Pt today feels better. Pt offers no new complaints. Patient wife's major concern in today's visit was brought delivery of peritoneal supplies from [employer].  Patient was clear he was that the supplies usually come on Tuesday and hopefully they can arrange for somebody to be there at home to get the supplies.  I discussed with patient wife to call dialysis center on Monday and to reach out to PD nurse/social worker who can help coordinate the supplies.  Medications . acetaminophen  1,000 mg Oral TID  . amiodarone  200 mg Oral Daily  . aspirin EC  81 mg Oral Daily  . Chlorhexidine Gluconate Cloth  6 each Topical Q0600  . docusate sodium  100 mg Oral BID  . feeding supplement (NEPRO CARB STEADY)  237 mL Oral BID BM  . gentamicin cream  1 application Topical Daily  . heparin injection (subcutaneous)  5,000 Units Subcutaneous Q8H  . levothyroxine  50 mcg Oral Daily  . midodrine  10 mg Oral TID  . multivitamin  1 tablet Oral Daily  . tamsulosin  0.4 mg Oral QPM  . torsemide  20 mg Oral Daily         [id number] from the symptoms mentioned above,there are no other symptoms referable to all systems reviewed.  Physical Exam: Vital signs in last 24 hours: Temp:  [97.7 F (36.5 C)-98.4 F (36.9 C)] 98.4 F (36.9 C) (11/22 0943) Pulse Rate:  [54-66] 66 (11/22 0943) Resp:  [14-18] 14 (11/22 0943) BP: (98-125)/(50-68) 98/50 (11/22 0943) SpO2:  [99 %-100 %] 99 % (11/22 0943) Weight change:  Last BM Date: 04/15/19  Intake/Output from previous day: 11/21 0701 - 11/22 0700 In: 960 [P.O.:960] Out: 128  No intake/output data recorded.   Physical Exam: General- pt is awake,alert,  oriented to time place and person Resp- No acute REsp distress, CTA B/L NO Rhonchi CVS- S1S2 regular in rate and rhythm GIT- BS+, soft, NT, ND,  EXT- NO LE Edema, NO Cyanosis Access- PD cath in situ   Lab Results: CBC Recent Labs    04/16/19 0403 04/18/19 0445  WBC 6.9 6.7  HGB 10.9* 11.4*  HCT 33.5* 35.2*  PLT 150 191    BMET Recent Labs    04/16/19 0403 04/18/19 0445  NA 141 141  K 3.7 3.8  CL 108 102  CO2 25 29  GLUCOSE 114* 109*  BUN 34* 39*  CREATININE 2.71* 2.78*  CALCIUM 8.0* 8.4*    MICRO Recent Results (from the past 240 hour(s))  SARS CORONAVIRUS 2 (TAT 6-24 HRS) Nasopharyngeal Nasopharyngeal Swab     Status: None   Collection Time: 04/14/19  3:35 AM   Specimen: Nasopharyngeal Swab  Result Value Ref Range Status   SARS Coronavirus 2 NEGATIVE NEGATIVE Final    Comment: (NOTE) SARS-CoV-2 target nucleic acids are NOT DETECTED. The SARS-CoV-2 RNA is generally detectable in upper and lower respiratory specimens during the acute phase of infection. Negative results do not preclude SARS-CoV-2 infection, do not rule out co-infections with other pathogens, and should not be used as the sole basis for treatment or other patient management decisions. Negative results must be combined  with clinical observations, patient history, and epidemiological information. The expected result is Negative. Fact Sheet for Patients: SugarRoll.be Fact Sheet for Healthcare Providers: https://www.woods-mathews.com/ This test is not yet approved or cleared by the [place] FDA and  has been authorized for detection and/or diagnosis of SARS-CoV-2 by FDA under an Emergency Use Authorization (EUA). This EUA will remain  in effect (meaning this test can be used) for the duration of the COVID-19 declaration under Section 56 4(b)(1) of the Act, 21 U.S.C. section 360bbb-3(b)(1), unless the authorization is terminated or revoked  sooner. Performed at [facility], [place] [address] [place], [place] [address]       Lab Results  Component Value Date   PTH 56 12/03/2017   CALCIUM 8.4 (L) 04/18/2019   PHOS 3.7 04/18/2019               Impression:  85 y.o. male with end-stage renal disease on peritoneal dialysis, atrial fibrillation, history of GI bleed, cardiomyopathy EF 20% 2019, hypothyroidism, history of stroke presents to hospital with left hip fracture after a fall-status post pinning April 14, 2019 Dr. Rudene Christians.    1)Renal ESRD Patient is on peritoneal dialysis. As outpatient Patient has dry weight of 62 kg Patient has total fill volume in 24 hours of 5.7 L. Patient is on PD with cycler Patient has 3 cycles with 1900 volume each  As inpatient We will continue patient on 2 L for 3 cycles Will use 1.5% bags  2)HTN Patient blood pressure is at goal   3)Anemia of chronic disease  HGb at goal (9--11) No need of Epogen as hemoglobin is at goal  4) secondary hyper parathyroidism CKD Mineral-Bone Disorder Patient had high phosphorus earlier Patient phosphorus is currently at goal No need for binders for now Calcium is at goal  5) left hip fracture Patient is s/p pinning on April 14, 2019 Plan was to send patient to SNF but no peritoneal dialysis was available at [facility].  And patient and the family were reluctant to start patient on hemodialysis as patient had done poorly with hemodialysis in the past. Discharge planning is currently working on discharging home with home health.  Patient will most likely be discharged on Monday   6) electrolytes  normokalemic NOrmonatremic   7)Acid base Co2 at goal     Plan:  Will continue to dialyze pt . We will continue patient on 2 L for 3 cycles Will use 1.5% bags    Ruben Hudson s Ruben Hudson 04/18/2019, 9:54 AM

## 2019-04-18 NOTE — Progress Notes (Signed)
   04/17/19 2320  Peritoneal Catheter Left lower abdomen  No Placement Date or Time found.   Catheter Location: Left lower abdomen  Site Assessment Clean;Dry;Intact;Draining  Drainage Description None  Catheter status Accessed;Draining;Patent  Dressing Gauze/Drain sponge  Dressing Status Clean;Intact;Dry  Cycler Setup  Total Number of Exchanges 3  Fill Volume 2000  Dianeal Solution Dextrose 1.5% in 6000 mL  Last Fill Volume 0  Fill Time - Minute(s) 10  Dwell Time - Hour(s) 8  Drain Time - Minute(s) 20 mins  Pause Option Status Initiated  Exit Site Care Performed Yes  TX INITIATED CATH WDL DRSG CHANGED CATH CARE COMPLETE

## 2019-04-18 NOTE — Progress Notes (Signed)
Physical Therapy Treatment Patient Details Name: Ruben Hudson MRN: [id number] DOB: 25-Mar-1926 Today's Date: 04/18/2019    History of Present Illness Pt is a 85 y.o. male presenting to hospital 04/14/19 s/p fall opening storm door with L hip pain.  Imaging showing nondisplaced L femoral neck fx and s/p L cannulated hip pinning 04/14/19.  PMH includes ESRD on nightly peritoneal dialysis, cardiomyopathy with reduced EF, home O2, CVA, R eye with limited peripheral vision, CKD stage 3, CHF, dysrhythmia, MI, a-fib wit RVR, h/o R ankle fx surgery, 2 L home O2 chronic.    PT Comments    Pt up in recliner upon arrival with spouse present. Pt finished with peritoneal dialysis however still attached to machine with nursing staff reporting they were unable to disconnect. Session limited to within room secondary to dialysis line. Pt reported mild pain t/o session. Pt, pts spouse and son educated on stair training and provided handout. Demonstration also provided for using walker and single handrail and going up steps forward to enter home and for using handrail and going up sideways. Caregiver and pt reported feeling like they could manage the steps to enter home going up sideways. pts spouse provided with gait belt to use at home. Spouse and son educated on proper use of gaitbelt and guarding techniques. Spouse and son also educated on car transfers. Pt performed seated LE therex requiring min cuing for correct performance. Pt encouraged to perform exercises on his own. Pt ambulated in room requiring min guard assist for transfers and ambulation. Pt is progressing well towards PT goals. PT will assess stair navigation tomorrow morning prior to hospital d/c. Recommendation remains appropriate.   Follow Up Recommendations  Home health PT;Supervision/Assistance - 24 hour     Equipment Recommendations  Rolling walker with 5" wheels;3in1 (PT)    Recommendations for Other Services       Precautions /  Restrictions Precautions Precautions: Fall Precaution Comments: peritoneal dialysis Restrictions Weight Bearing Restrictions: Yes LLE Weight Bearing: Weight bearing as tolerated    Mobility  Bed Mobility               General bed mobility comments: deferred, pt in recliner upon arrival  Transfers Overall transfer level: Needs assistance Equipment used: Rolling walker (2 wheeled) Transfers: Sit to/from Stand Sit to Stand: Min guard         General transfer comment: good carryover of hand/foot placement, steady with transfer, min guard for safety and line mgt  Ambulation/Gait Ambulation/Gait assistance: Min guard Gait Distance (Feet): 20 Feet Assistive device: Rolling walker (2 wheeled) Gait Pattern/deviations: Step-to pattern;Decreased weight shift to left;Decreased stance time - left;Antalgic Gait velocity: decreased   General Gait Details: ambulation limited to in room secondary to dialysis not being removed, pt steady with no buckling or LOB, pt very cautious with ambulation, increased reliance on UE support from [employer]             Wheelchair Mobility    Modified Rankin (Stroke Patients Only)       Balance Overall balance assessment: Needs assistance Sitting-balance support: No upper extremity supported;Feet supported Sitting balance-Leahy Scale: Good Sitting balance - Comments: steady sitting in recliner performing LE therex   Standing balance support: Bilateral upper extremity supported;During functional activity Standing balance-Leahy Scale: Poor Standing balance comment: reliant on UE support during ambulation  Cognition Arousal/Alertness: Awake/alert Behavior During Therapy: Impulsive Overall Cognitive Status: History of cognitive impairments - at baseline                                        Exercises Total Joint Exercises Ankle Circles/Pumps: AROM;Both;10 reps Towel Squeeze:  AROM;Both;10 reps Long Arc Quad: AROM;Both;10 reps Marching in Standing: AROM;Both;10 reps;Seated General Exercises - Lower Extremity Toe Raises: AROM;Both;10 reps Heel Raises: AROM;Both;10 reps    General Comments        Pertinent Vitals/Pain Faces Pain Scale: Hurts little more Pain Location: L hip/thigh Pain Descriptors / Indicators: Aching;Sore;Tender;Operative site guarding Pain Intervention(s): Limited activity within patient's tolerance;Monitored during session;Repositioned    Home Living                      Prior Function            PT Goals (current goals can now be found in the care plan section) Progress towards PT goals: Progressing toward goals    Frequency    BID      PT Plan Current plan remains appropriate    Co-evaluation              AM-PAC PT "6 Clicks" Mobility   Outcome Measure  Help needed turning from your back to your side while in a flat bed without using bedrails?: A Lot Help needed moving from lying on your back to sitting on the side of a flat bed without using bedrails?: A Lot Help needed moving to and from a bed to a chair (including a wheelchair)?: A Little Help needed standing up from a chair using your arms (e.g., wheelchair or bedside chair)?: A Little Help needed to walk in hospital room?: A Little Help needed climbing 3-5 steps with a railing? : A Lot 6 Click Score: 15    End of Session Equipment Utilized During Treatment: Gait belt Activity Tolerance: Patient tolerated treatment well;Treatment limited secondary to medical complications (Comment)(dialysis not yet removed) Patient left: in chair;with call bell/phone within reach;with family/visitor present Nurse Communication: Mobility status PT Visit Diagnosis: Other abnormalities of gait and mobility (R26.89);Muscle weakness (generalized) (M62.81);History of falling (Z91.81);Difficulty in walking, not elsewhere classified (R26.2);Pain Pain - Right/Left:  Left Pain - part of body: Hip     Time: [id number] PT Time Calculation (min) (ACUTE ONLY): 50 min  Charges:  $Therapeutic Exercise: 8-22 mins $Therapeutic Activity: 8-22 mins $Self Care/Home Management: 8-22                     Zachary George PT, DPT 2:07 PM,04/18/19 [phone]    Ruben Hudson Drucilla Chalet 04/18/2019, 2:02 PM

## 2019-04-18 NOTE — Progress Notes (Signed)
   04/17/19 2305  Neurological  Level of Consciousness Alert  Orientation Level Oriented to person;Oriented to situation  Respiratory  Respiratory Pattern Regular;Unlabored  Bilateral Breath Sounds Clear  Cardiac  Pulse Regular  ECG Monitor Yes  PT STABLE FOR PD WIFE AT BEDSIDE

## 2019-04-18 NOTE — Progress Notes (Signed)
PROGRESS NOTE    SIRJAMES NIM  [id number] DOB: 08/25/1925 DOA: 04/14/2019 PCP: Kirk Ruths, MD      Brief Narrative:  Mr. Ruben Hudson is a 85 y.o. male with history of A. fib not on anticoagulation secondary to GI bleed, cardiomyopathy last EF measured was 20% in 2019, chronic hypoxic respiratory failure on 2L O2, ESRD on peritoneal dialysis for last 2 years, hypothyroidism, history of stroke with partial vision loss in the right eye on aspirin had a fall at home when he was trying to open the storm door.  Had a mechanical fall after blowing leaves in yard.  Had immediate hip pain.  Did not lose consciousness. Denied any chest pain or shortness of breath.  In the ER CT left hip showed left hip fracture.  On-call orthopedic surgeon and nephrologist were consulted.  Labs revealed WBC of 15.3 potassium 4.3 bicarb 28.  EKG showed old LBBB.         Assessment & Plan:  Acute left hip fracture S/p cannulated pinning of the left hip by Dr. Rudene Christians -Consult orthopedics, appreciate recommendations -Continue scheduled Tylenol for pain control -Continue oxycodone as needed   Delirium superimposed on baseline dementia Seems resolved Delirium precautions:   -Lights and TV off, minimize interruptions at night  -Blinds open and lights on during day  -Glasses/hearing aid with patient  -Frequent reorientation  -PT/OT when able  -Avoid sedation medications/Beers list medications   Atrial fibrillation, chronic Not on anticoagulation due to GI bleed.  Rate controlled -Continue amiodarone    ESRD on PD -Consult nephrology to manage his routine dialysis -Continue every other day home torsemide  Chronic systolic CHF LBBB Chronic respiratory failure with hypoxia EF 20% in 2019.  Appears euvolemic.  Hypothyroidism -Continue levothyroxine  History of stroke -Continue aspirin  Prolonged QT interval ruled out LBBB corrected QTc is 450.  Recurrent syncope -Continue  midodrine  Moderate protein calorie malnutrition -Continue Nepro  Acute blood loss anemia on chronic anemia of CKD Stable, expected postoperative anemia       DVT prophylaxis: Heparin Code Status: Full code Family Communication: Wife MDM and disposition Plan:  The below labs and imaging reports reviewed and summarized above.  Medication management as above.   The patient was admitted with hip fracture.  He underwent uncomplicated hip fracture repair 11/18.    At present, he has a significant medical complexity (requires daily peritoneal dialysis, also has some dementia and associated confused behavior at times that require daily management by rehabilitation MD), but he is extremely functional at baseline, ambulates without a walker, and is independent for self-cares, lives independently with his wife and is very motivated to improve and return home, where he lives with his wife, and has very good social support from a son nearby.    He continues to require substantial physical and occupational therapy to return to his prior level of function and I suspect he can tolerate several hours physical therapy per day as well as occupational therapy from when she would benefit in order to return home in appropriate manner.   We will continue in-house physical therapy as we make arrangements for post discharge, but I presently do not have a safe discharge plan       Objective: Vitals:   04/17/19 0835 04/17/19 1646 04/18/19 0035 04/18/19 0943  BP: (!) 120/53 (!) 119/52 125/68 (!) 98/50  Pulse: (!) 57 (!) 54 66 66  Resp: 16 18 18 14   Temp: 97.8 F (36.6 C)  97.9 F (36.6 C) 97.7 F (36.5 C) 98.4 F (36.9 C)  TempSrc: Oral Oral Oral Oral  SpO2: 99% 100% 100% 99%  Weight:      Height:        Intake/Output Summary (Last 24 hours) at 04/18/2019 1233 Last data filed at 04/18/2019 0945 Gross per 24 hour  Intake 960 ml  Output -  Net 960 ml   Filed Weights   04/14/19 0034 04/14/19  1152  Weight: 59.9 kg 59.9 kg    Examination: General appearance:  adult male, alert and in no acute distress. Sitting in recliner. HEENT: Anicteric, conjunctiva pink, lids and lashes normal. No nasal deformity, discharge, epistaxis. Nasal cannula in place. OP moist, no oral lesions. Hearing diminished. Skin: Warm and dry.  No suspicious rashes or lesions. Cardiac: RRR, no murmurs appreciated.  No LE edema.    Respiratory: Normal respiratory rate and rhythm.  CTAB without rales or wheezes. Abdomen: Abdomen soft.  No ascites, distension, hepatosplenomegaly.   MSK: No deformities or effusions of the large joints of the upper or lower extremities bilaterally. Neuro: Awake and alert. Naming is grossly intact, and the patient's recall, recent and remote, as well as general fund of knowledge seem within normal limits.  Muscle tone decreased, tremulous, without fasciculations.  Moves all extremities with generalized weakness. Speech fluent.    Psych: Sensorium intact and responding to questions, attention normal. Affect normal.  Judgment and insight appear impaired by dementia.         Data Reviewed: I have personally reviewed following labs and imaging studies:  CBC: Recent Labs  Lab 04/14/19 0223 04/15/19 0506 04/16/19 0403 04/18/19 0445  WBC 15.3* 10.3 6.9 6.7  NEUTROABS 14.0*  --   --   --   HGB 13.8 11.8* 10.9* 11.4*  HCT 42.5 37.5* 33.5* 35.2*  MCV 98.8 100.3* 100.3* 100.3*  PLT 186 162 150 [id number]   Basic Metabolic Panel: Recent Labs  Lab 04/14/19 0223 04/15/19 0506 04/16/19 0403 04/18/19 0445  NA 142 142 141 141  K 4.3 4.1 3.7 3.8  CL 103 106 108 102  CO2 28 25 25 29   GLUCOSE 136* 160* 114* 109*  BUN 41* 35* 34* 39*  CREATININE 3.53* 2.92* 2.71* 2.78*  CALCIUM 8.6* 8.3* 8.0* 8.4*  PHOS  --   --  3.6 3.7   GFR: Estimated Creatinine Clearance: 14.7 mL/min (A) (by C-G formula based on SCr of 2.78 mg/dL (H)). Liver Function Tests: Recent Labs  Lab 04/16/19 0403  04/18/19 0445  ALBUMIN 2.7* 3.0*   No results for input(s): LIPASE, AMYLASE in the last 168 hours. No results for input(s): AMMONIA in the last 168 hours. Coagulation Profile: No results for input(s): INR, PROTIME in the last 168 hours. Cardiac Enzymes: No results for input(s): CKTOTAL, CKMB, CKMBINDEX, TROPONINI in the last 168 hours. BNP (last 3 results) No results for input(s): PROBNP in the last 8760 hours. HbA1C: No results for input(s): HGBA1C in the last 72 hours. CBG: No results for input(s): GLUCAP in the last 168 hours. Lipid Profile: No results for input(s): CHOL, HDL, LDLCALC, TRIG, CHOLHDL, LDLDIRECT in the last 72 hours. Thyroid Function Tests: No results for input(s): TSH, T4TOTAL, FREET4, T3FREE, THYROIDAB in the last 72 hours. Anemia Panel: No results for input(s): VITAMINB12, FOLATE, FERRITIN, TIBC, IRON, RETICCTPCT in the last 72 hours. Urine analysis:    Component Value Date/Time   COLORURINE AMBER (A) 03/09/2017 1654   APPEARANCEUR HAZY (A) 03/09/2017 1654   APPEARANCEUR Clear  11/22/2015 1514   LABSPEC 1.014 03/09/2017 1654   LABSPEC 1.008 05/28/2012 1120   PHURINE 5.0 03/09/2017 1654   GLUCOSEU NEGATIVE 03/09/2017 1654   GLUCOSEU Negative 05/28/2012 1120   HGBUR SMALL (A) 03/09/2017 1654   BILIRUBINUR NEGATIVE 03/09/2017 1654   BILIRUBINUR Negative 11/22/2015 1514   BILIRUBINUR Negative 05/28/2012 1120   KETONESUR NEGATIVE 03/09/2017 1654   PROTEINUR 30 (A) 03/09/2017 1654   NITRITE NEGATIVE 03/09/2017 1654   LEUKOCYTESUR NEGATIVE 03/09/2017 1654   LEUKOCYTESUR Negative 11/22/2015 1514   LEUKOCYTESUR Negative 05/28/2012 1120   Sepsis Labs: @LABRCNTIP (procalcitonin:4,lacticacidven:4)  ) Recent Results (from the past 240 hour(s))  SARS CORONAVIRUS 2 (TAT 6-24 HRS) Nasopharyngeal Nasopharyngeal Swab     Status: None   Collection Time: 04/14/19  3:35 AM   Specimen: Nasopharyngeal Swab  Result Value Ref Range Status   SARS Coronavirus 2 NEGATIVE  NEGATIVE Final    Comment: (NOTE) SARS-CoV-2 target nucleic acids are NOT DETECTED. The SARS-CoV-2 RNA is generally detectable in upper and lower respiratory specimens during the acute phase of infection. Negative results do not preclude SARS-CoV-2 infection, do not rule out co-infections with other pathogens, and should not be used as the sole basis for treatment or other patient management decisions. Negative results must be combined with clinical observations, patient history, and epidemiological information. The expected result is Negative. Fact Sheet for Patients: SugarRoll.be Fact Sheet for Healthcare Providers: https://www.woods-mathews.com/ This test is not yet approved or cleared by the [place] FDA and  has been authorized for detection and/or diagnosis of SARS-CoV-2 by FDA under an Emergency Use Authorization (EUA). This EUA will remain  in effect (meaning this test can be used) for the duration of the COVID-19 declaration under Section 56 4(b)(1) of the Act, 21 U.S.C. section 360bbb-3(b)(1), unless the authorization is terminated or revoked sooner. Performed at [facility], [place] [address], [place], [place] [address]          Radiology Studies: Dg Chest Port 1 View  Result Date: 04/16/2019 CLINICAL DATA:  Productive cough. EXAM: PORTABLE CHEST 1 VIEW COMPARISON:  04/14/2019. FINDINGS: Trachea is midline. Heart is enlarged, stable. Thoracic aorta is calcified. Streaky opacities in both lung bases. Small bilateral pleural effusions appear partially loculated. IMPRESSION: 1. Small bilateral pleural effusions appear partially loculated. 2. Bibasilar atelectasis. Electronically Signed   By: Lorin Picket M.D.   On: 04/16/2019 15:34        Scheduled Meds: . acetaminophen  1,000 mg Oral TID  . amiodarone  200 mg Oral Daily  . aspirin EC  81 mg Oral Daily  . Chlorhexidine Gluconate Cloth  6 each Topical Q0600  .  docusate sodium  100 mg Oral BID  . feeding supplement (NEPRO CARB STEADY)  237 mL Oral BID BM  . gentamicin cream  1 application Topical Daily  . heparin injection (subcutaneous)  5,000 Units Subcutaneous Q8H  . levothyroxine  50 mcg Oral Daily  . midodrine  10 mg Oral TID  . multivitamin  1 tablet Oral Daily  . tamsulosin  0.4 mg Oral QPM  . torsemide  20 mg Oral Daily   Continuous Infusions: . dialysis solution 1.5% low-MG/low-CA       LOS: 4 days    Time spent: 25 minutes    Edwin Dada, MD [facility] 04/18/2019, 12:33 PM     Please page though [place] or Epic secure chat:  For password, contact charge nurse

## 2019-04-18 NOTE — Progress Notes (Signed)
  Subjective: 4 Days Post-Op Procedure(s) (LRB): CANNULATED HIP PINNING, LEFT (Left) Patient reports pain as mild.   Patient seen in rounds with Dr. Marry Guan. Patient is well, and has had no acute complaints or problems Plan is to go Home after hospital stay. Negative for chest pain and shortness of breath Fever: no Gastrointestinal: Negative for nausea and vomiting  Objective: Vital signs in last 24 hours: Temp:  [97.7 F (36.5 C)-97.9 F (36.6 C)] 97.7 F (36.5 C) (11/22 0035) Pulse Rate:  [54-66] 66 (11/22 0035) Resp:  [16-18] 18 (11/22 0035) BP: (119-125)/(52-68) 125/68 (11/22 0035) SpO2:  [99 %-100 %] 100 % (11/22 0035)  Intake/Output from previous day:  Intake/Output Summary (Last 24 hours) at 04/18/2019 0623 Last data filed at 04/17/2019 1700 Gross per 24 hour  Intake 960 ml  Output 128 ml  Net 832 ml    Intake/Output this shift: No intake/output data recorded.  Labs: Recent Labs    04/16/19 0403 04/18/19 0445  HGB 10.9* 11.4*   Recent Labs    04/16/19 0403 04/18/19 0445  WBC 6.9 6.7  RBC 3.34* 3.51*  HCT 33.5* 35.2*  PLT 150 191   Recent Labs    04/16/19 0403 04/18/19 0445  NA 141 141  K 3.7 3.8  CL 108 102  CO2 25 29  BUN 34* 39*  CREATININE 2.71* 2.78*  GLUCOSE 114* 109*  CALCIUM 8.0* 8.4*   No results for input(s): LABPT, INR in the last 72 hours.   EXAM General - Patient is Alert and Confused Extremity - Sensation intact distally Dorsiflexion/Plantar flexion intact Compartment soft Dressing/Incision - clean, dry and scant blood-tinged drainage  Motor Function - intact, moving foot and toes well on exam.  The patient ambulated 92 feet with physical therapy  Past Medical History:  Diagnosis Date  . Acute pulmonary edema ([place]) 02/26/2013  . Anxiety   . Arthritis    little  . Benign prostatic hyperplasia with urinary obstruction 02/24/2012  . BP (high blood pressure) 10/31/2015  . Cataract extraction status of left eye   . Cataract  extraction status of right eye   . Cerebrovascular accident (CVA) ([place]) 04/24/2012   affected right eye, limited peripheral vision  . Chronic kidney disease 07/25/2017   stage 3  . Congestive heart failure ([place]) 10/31/2015  . Dyspnea    uses continuous o2  . Dysrhythmia    AF  . HLD (hyperlipidemia) 10/31/2015  . Hydrocele 02/25/2012  . Myocardial infarction ([place])    per patient, small MI  . Renal insufficiency   . Unstable angina pectoris ([place]) 02/26/2013  . Urge incontinence of urine 05/30/2014  . Urinary urgency 10/31/2015    Assessment/Plan: 4 Days Post-Op Procedure(s) (LRB): CANNULATED HIP PINNING, LEFT (Left) Principal Problem:   Closed left hip fracture, initial encounter [facility]) Active Problems:   Congestive heart failure (HCC)   ESRD on dialysis [facility])   Atrial fibrillation, chronic (HCC)  Estimated body mass index is 18.95 kg/m as calculated from the following:   Height as of this encounter: 5\' 10"  (1.778 m).   Weight as of this encounter: 59.9 kg.  Advance diet. Physical therapy.  Discharge planning to go home.  Plan for Monday.  Awaiting medical clearance Follow-up at [facility] clinic in 2 weeks after discharge for staple removal.  DVT Prophylaxis - Foot Pumps, TED hose and Heparin Weight-Bearing as tolerated to left leg  Reche Dixon, PA-C Orthopaedic Surgery 04/18/2019, 6:23 AM

## 2019-04-18 NOTE — Progress Notes (Signed)
   04/18/19 1945  Cycler Setup  Total Number of Exchanges 3  Dianeal Solution Dextrose 1.5% in 3000 mL  Fill Time - Minute(s) 10  Dwell Time - Hour(s) 8  Drain Time - Minute(s) 20 mins  Pause Option Status Initiated  Dianeal Solution Dextrose 1.5% in 3000 mL  Exit Site Care Performed Yes  pt stable for tx wife at chairside no c/os voiced.

## 2019-04-19 DIAGNOSIS — R058 Other specified cough: Secondary | ICD-10-CM

## 2019-04-19 DIAGNOSIS — R05 Cough: Secondary | ICD-10-CM

## 2019-04-19 NOTE — Progress Notes (Signed)
Physical Therapy Treatment Patient Details Name: Ruben Hudson MRN: [id number] DOB: 1925/11/29 Today's Date: 04/19/2019    History of Present Illness Pt is a 85 y.o. male presenting to hospital 04/14/19 s/p fall opening storm door with L hip pain.  Imaging showing nondisplaced L femoral neck fx and s/p L cannulated hip pinning 04/14/19.  PMH includes ESRD on nightly peritoneal dialysis, cardiomyopathy with reduced EF, home O2, CVA, R eye with limited peripheral vision, CKD stage 3, CHF, dysrhythmia, MI, a-fib wit RVR, h/o R ankle fx surgery, 2 L home O2 chronic.    PT Comments    Pt able to ambulate 120 feet with RW CGA (assist and cueing provided by pt's wife) and navigate 4 stairs with B UE support (pt's wife reports her son will provide physical assist on stairs but she will be providing the cueing).  Pt's wife able to safely and appropriately cue pt with transfers, ambulation, and stairs.  Educated pt's wife on DME recommendations, how to safely fit RW to pt (for appropriate walker height), and recommendations for 24/7 assist with functional mobility for safety: pt's wife able to verbalize appropriate understanding.  Pt's wife reports having a w/c at home and plans to use this at night so pt does not have to walk to the bathroom at night.  A therapist issued pt a gait belt yesterday for pt's family to use to assist pt with mobility.  Pt and pt's wife report no further questions/concerns regarding physical therapy prior to hospital discharge and also report home ramp in progress of being built.  Plan to discharge home today with HHPT and 24/7 physical assist for functional mobility.   Follow Up Recommendations  Home health PT;Supervision/Assistance - 24 hour     Equipment Recommendations  Rolling walker with 5" wheels;3in1 (PT)    Recommendations for Other Services OT consult     Precautions / Restrictions Precautions Precautions: Fall Precaution Comments: peritoneal  dialysis Restrictions Weight Bearing Restrictions: Yes LLE Weight Bearing: Weight bearing as tolerated    Mobility  Bed Mobility                  Transfers Overall transfer level: Needs assistance Equipment used: Rolling walker (2 wheeled) Transfers: Sit to/from Stand Sit to Stand: Supervision;Min guard         General transfer comment: intermittent vc's for UE/LE placement  Ambulation/Gait Ambulation/Gait assistance: Min guard Gait Distance (Feet): 120 Feet Assistive device: Rolling walker (2 wheeled) Gait Pattern/deviations: Step-to pattern;Decreased weight shift to left;Decreased stance time - left;Antalgic Gait velocity: decreased   General Gait Details: steady with RW; occasional vc's for positioning within walker; increased UE support through RW to offweight L LE   Stairs Stairs: Yes Stairs assistance: Min guard Stair Management: Two rails;One rail Right;Step to pattern;Sideways Number of Stairs: 4 General stair comments: ascended/descended 1 step with B railings and ascended/descended 3 steps with B UE support on R railing (to simulate home set-up); vc's for stepping pattern and UE placement   Wheelchair Mobility    Modified Rankin (Stroke Patients Only)       Balance Overall balance assessment: Needs assistance Sitting-balance support: No upper extremity supported;Feet supported Sitting balance-Leahy Scale: Good Sitting balance - Comments: steady sitting reaching within BOS   Standing balance support: No upper extremity supported Standing balance-Leahy Scale: Poor Standing balance comment: pt with one mild loss of balance when transitioning UE's to walker (from railings) after performing stairs requiring min assist to steady; pt otherwise steady rest  of session's activities                            Cognition Arousal/Alertness: Awake/alert Behavior During Therapy: Impulsive Overall Cognitive Status: History of cognitive impairments  - at baseline                                 General Comments: Oriented to person and place and situation; generalized confusion noted, appearing agitated at times (pt focused on going home)      Exercises      General Comments   Nursing cleared pt for participation in physical therapy.  Pt and pt's wife agreeable to PT session.      Pertinent Vitals/Pain Pain Assessment: Faces Faces Pain Scale: Hurts a little bit(at rest 2/10; with activity 6/10) Pain Location: L hip/thigh Pain Descriptors / Indicators: Aching;Sore;Tender;Operative site guarding Pain Intervention(s): Limited activity within patient's tolerance;Monitored during session;Repositioned  Vitals (HR and O2 on 2 L via nasal cannula) stable and WFL throughout treatment session.    Home Living                      Prior Function            PT Goals (current goals can now be found in the care plan section) Acute Rehab PT Goals Patient Stated Goal: to improve mobility and have less pain PT Goal Formulation: With patient/family Time For Goal Achievement: 04/29/19 Potential to Achieve Goals: Good Progress towards PT goals: Progressing toward goals    Frequency    BID      PT Plan Current plan remains appropriate    Co-evaluation              AM-PAC PT "6 Clicks" Mobility   Outcome Measure  Help needed turning from your back to your side while in a flat bed without using bedrails?: A Little Help needed moving from lying on your back to sitting on the side of a flat bed without using bedrails?: A Little Help needed moving to and from a bed to a chair (including a wheelchair)?: A Little Help needed standing up from a chair using your arms (e.g., wheelchair or bedside chair)?: A Little Help needed to walk in hospital room?: A Little Help needed climbing 3-5 steps with a railing? : A Little 6 Click Score: 18    End of Session Equipment Utilized During Treatment: Gait belt(up high  away from peritoneal dialysis area) Activity Tolerance: Patient tolerated treatment well Patient left: in chair;with call bell/phone within reach;with chair alarm set;with family/visitor present;Other (comment)(B heels floating via pillow) Nurse Communication: Mobility status;Precautions PT Visit Diagnosis: Other abnormalities of gait and mobility (R26.89);Muscle weakness (generalized) (M62.81);History of falling (Z91.81);Difficulty in walking, not elsewhere classified (R26.2);Pain Pain - Right/Left: Left Pain - part of body: Hip     Time: 0926-1012 PT Time Calculation (min) (ACUTE ONLY): 46 min  Charges:  $Gait Training: 23-37 mins $Therapeutic Activity: 8-22 mins                    Leitha Bleak, PT 04/19/19, 10:31 AM [phone]

## 2019-04-19 NOTE — Progress Notes (Signed)
  Subjective: 5 Days Post-Op Procedure(s) (LRB): CANNULATED HIP PINNING, LEFT (Left) Patient reports pain as mild.   Patient is well, and has had no acute complaints or problems Plan is to go Home after hospital stay. Negative for chest pain and shortness of breath Fever: no Gastrointestinal: Negative for nausea and vomiting  Objective: Vital signs in last 24 hours: Temp:  [97.6 F (36.4 C)-98.4 F (36.9 C)] 97.6 F (36.4 C) (11/23 0522) Pulse Rate:  [56-70] 70 (11/23 0522) Resp:  [14-18] 18 (11/23 0522) BP: (98-125)/(50-58) 125/58 (11/23 0522) SpO2:  [98 %-100 %] 98 % (11/23 0522)  Intake/Output from previous day:  Intake/Output Summary (Last 24 hours) at 04/19/2019 0804 Last data filed at 04/18/2019 1900 Gross per 24 hour  Intake 960 ml  Output -  Net 960 ml    Intake/Output this shift: No intake/output data recorded.  Labs: Recent Labs    04/18/19 0445  HGB 11.4*   Recent Labs    04/18/19 0445  WBC 6.7  RBC 3.51*  HCT 35.2*  PLT 191   Recent Labs    04/18/19 0445  NA 141  K 3.8  CL 102  CO2 29  BUN 39*  CREATININE 2.78*  GLUCOSE 109*  CALCIUM 8.4*   No results for input(s): LABPT, INR in the last 72 hours.   EXAM General - Patient is Alert and Confused Extremity - Sensation intact distally Dorsiflexion/Plantar flexion intact Compartment soft Dressing/Incision - clean, dry and scant blood-tinged drainage  Motor Function - intact, moving foot and toes well on exam.    Past Medical History:  Diagnosis Date  . Acute pulmonary edema ([place]) 02/26/2013  . Anxiety   . Arthritis    little  . Benign prostatic hyperplasia with urinary obstruction 02/24/2012  . BP (high blood pressure) 10/31/2015  . Cataract extraction status of left eye   . Cataract extraction status of right eye   . Cerebrovascular accident (CVA) ([place]) 04/24/2012   affected right eye, limited peripheral vision  . Chronic kidney disease 07/25/2017   stage 3  . Congestive heart  failure ([place]) 10/31/2015  . Dyspnea    uses continuous o2  . Dysrhythmia    AF  . HLD (hyperlipidemia) 10/31/2015  . Hydrocele 02/25/2012  . Myocardial infarction ([place])    per patient, small MI  . Renal insufficiency   . Unstable angina pectoris ([place]) 02/26/2013  . Urge incontinence of urine 05/30/2014  . Urinary urgency 10/31/2015    Assessment/Plan: 5 Days Post-Op Procedure(s) (LRB): CANNULATED HIP PINNING, LEFT (Left) Principal Problem:   Closed left hip fracture, initial encounter [facility]) Active Problems:   Congestive heart failure (HCC)   ESRD on dialysis [facility])   Atrial fibrillation, chronic (HCC)  Estimated body mass index is 18.95 kg/m as calculated from the following:   Height as of this encounter: 5\' 10"  (1.778 m).   Weight as of this encounter: 59.9 kg.  Advance diet. Physical therapy, WBAT LLE Discharge planning to go home.  Plan for Monday.  Awaiting medical clearance Follow-up at [facility] clinic in 4 weeks for xrays Plan on staple removal 2 weeks post op at home with HHPT  Compression stockings bilateral lower extremities x6 weeks  DVT Prophylaxis - Foot Pumps, TED hose and Heparin Weight-Bearing as tolerated to left leg  Duanne Guess, PA-C Orthopaedic Surgery 04/19/2019, 8:04 AM

## 2019-04-19 NOTE — TOC Transition Note (Signed)
Transition of Care [facility]) - CM/SW Discharge Note   Patient Details  Name: JHETT NEIRA MRN: [id number] Date of Birth: December 12, 1925  Transition of Care [facility]) CM/SW Contact:  Shelbie Hutching, RN Phone Number: 04/19/2019, 12:41 PM   Clinical Narrative:    Patient discharging home with home health services through [facility].  Cassie Tiffany Kocher is aware of discharge, orders are in for RN, PT, OT, and aide.  Wife is providing patient home- she has help at home to get him out of the car.  Patient has a wheelchair at home.  Wife reports that she is comfortable taking the patient home and she will have help at home.    Final next level of care: Home w Home Health Services Barriers to Discharge: Barriers Resolved   Patient Goals and CMS Choice   CMS Medicare.gov Compare Post Acute Care list provided to:: Patient Choice offered to / list presented to : Patient  Discharge Placement                       Discharge Plan and Services   Discharge Planning Services: CM Consult Post Acute Care Choice: Home Health          DME Arranged: N/A         HH Arranged: RN, PT, OT, Nurse's Aide [place] Agency: [facility] Date [place]: 04/19/19 Time HH Agency Contacted: 3 Representative spoke with at [place]: Cassie  Social Determinants of Health ([place]) Interventions     Readmission Risk Interventions No flowsheet data found.

## 2019-04-19 NOTE — Care Management Important Message (Signed)
Important Message  Patient Details  Name: Ruben Hudson MRN: [id number] Date of Birth: [birth year]   Medicare Important Message Given:  Yes     Juliann Pulse A Denzal Meir 04/19/2019, 10:52 AM

## 2019-04-19 NOTE — Progress Notes (Signed)
Pd completed 

## 2019-04-19 NOTE — Plan of Care (Signed)
Patient discharge instructions and prescription given to wife , understanding verbalized.  Belongings taken with wife.  Denies pain or discomfort.  Connected to home 02.

## 2019-04-19 NOTE — Progress Notes (Signed)
[facility], [place] 04/19/19  Subjective:  Patient continues to tolerate peritoneal dialysis well. Wife has been helping the patient at home with peritoneal dialysis. Pain control in the left hip appears to be fair.  Objective:  Vital signs in last 24 hours:  Temp:  [97.6 F (36.4 C)-98.4 F (36.9 C)] 98.4 F (36.9 C) (11/23 0807) Pulse Rate:  [56-70] 67 (11/23 0807) Resp:  [16-18] 17 (11/23 0807) BP: (114-126)/(56-60) 126/60 (11/23 0807) SpO2:  [96 %-100 %] 96 % (11/23 0807)  Weight change:  Filed Weights   04/14/19 0034 04/14/19 1152  Weight: 59.9 kg 59.9 kg    Intake/Output:    Intake/Output Summary (Last 24 hours) at 04/19/2019 1408 Last data filed at 04/19/2019 [id number] Gross per 24 hour  Intake 480 ml  Output 247 ml  Net 233 ml     Physical Exam: General:  No acute distress, sitting up in the chair  HEENT  anicteric, moist oral mucous membranes  Pulm/lungs  coarse breath sounds [place] O2 continuous  CVS/Heart  no rub  Abdomen:   Soft, nontender  Extremities:  Trace edema  Neurologic:  Alert, able to answer questions  Skin:  No acute rashes  Access:  PD catheter       Basic Metabolic Panel:  Recent Labs  Lab 04/14/19 0223 04/15/19 0506 04/16/19 0403 04/18/19 0445  NA 142 142 141 141  K 4.3 4.1 3.7 3.8  CL 103 106 108 102  CO2 28 25 25 29   GLUCOSE 136* 160* 114* 109*  BUN 41* 35* 34* 39*  CREATININE 3.53* 2.92* 2.71* 2.78*  CALCIUM 8.6* 8.3* 8.0* 8.4*  PHOS  --   --  3.6 3.7     CBC: Recent Labs  Lab 04/14/19 0223 04/15/19 0506 04/16/19 0403 04/18/19 0445  WBC 15.3* 10.3 6.9 6.7  NEUTROABS 14.0*  --   --   --   HGB 13.8 11.8* 10.9* 11.4*  HCT 42.5 37.5* 33.5* 35.2*  MCV 98.8 100.3* 100.3* 100.3*  PLT 186 162 150 191      Lab Results  Component Value Date   HEPBSAG Negative 03/17/2017      Microbiology:  Recent Results (from the past 240 hour(s))  SARS CORONAVIRUS 2 (TAT 6-24 HRS) Nasopharyngeal  Nasopharyngeal Swab     Status: None   Collection Time: 04/14/19  3:35 AM   Specimen: Nasopharyngeal Swab  Result Value Ref Range Status   SARS Coronavirus 2 NEGATIVE NEGATIVE Final    Comment: (NOTE) SARS-CoV-2 target nucleic acids are NOT DETECTED. The SARS-CoV-2 RNA is generally detectable in upper and lower respiratory specimens during the acute phase of infection. Negative results do not preclude SARS-CoV-2 infection, do not rule out co-infections with other pathogens, and should not be used as the sole basis for treatment or other patient management decisions. Negative results must be combined with clinical observations, patient history, and epidemiological information. The expected result is Negative. Fact Sheet for Patients: SugarRoll.be Fact Sheet for Healthcare Providers: https://www.woods-mathews.com/ This test is not yet approved or cleared by the [place] FDA and  has been authorized for detection and/or diagnosis of SARS-CoV-2 by FDA under an Emergency Use Authorization (EUA). This EUA will remain  in effect (meaning this test can be used) for the duration of the COVID-19 declaration under Section 56 4(b)(1) of the Act, 21 U.S.C. section 360bbb-3(b)(1), unless the authorization is terminated or revoked sooner. Performed at [facility], [place] [address], [place], [place] [address]  Coagulation Studies: No results for input(s): LABPROT, INR in the last 72 hours.  Urinalysis: No results for input(s): COLORURINE, LABSPEC, PHURINE, GLUCOSEU, HGBUR, BILIRUBINUR, KETONESUR, PROTEINUR, UROBILINOGEN, NITRITE, LEUKOCYTESUR in the last 72 hours.  Invalid input(s): APPERANCEUR    Imaging: No results found.   Medications:   . dialysis solution 1.5% low-MG/low-CA     . acetaminophen  1,000 mg Oral TID  . amiodarone  200 mg Oral Daily  . aspirin EC  81 mg Oral Daily  . Chlorhexidine Gluconate Cloth  6 each Topical  Q0600  . docusate sodium  100 mg Oral BID  . feeding supplement (NEPRO CARB STEADY)  237 mL Oral BID BM  . gentamicin cream  1 application Topical Daily  . heparin injection (subcutaneous)  5,000 Units Subcutaneous Q8H  . levothyroxine  50 mcg Oral Daily  . midodrine  10 mg Oral TID  . multivitamin  1 tablet Oral Daily  . tamsulosin  0.4 mg Oral QPM  . torsemide  20 mg Oral Daily   alum & mag hydroxide-simeth, bisacodyl, heparin, HYDROmorphone (DILAUDID) injection, magnesium citrate, magnesium hydroxide, menthol-cetylpyridinium **OR** phenol, metoCLOPramide **OR** metoCLOPramide (REGLAN) injection, ondansetron **OR** ondansetron (ZOFRAN) IV, oxyCODONE  Assessment/ Plan:  85 y.o. male with end-stage renal disease on peritoneal dialysis, atrial fibrillation, history of GI bleed, cardiomyopathy EF 20% 2019, hypothyroidism, history of stroke presents to hospital with left hip fracture after a fall-status post pinning April 14, 2019 Dr. Rudene Christians.  Principal Problem:   Closed left hip fracture, initial encounter [facility]) Active Problems:   Congestive heart failure (HCC)   ESRD on dialysis [facility])   Atrial fibrillation, chronic (HCC)   Cough productive of clear sputum   #.  End-stage renal disease on peritoneal dialysis Target Wt 62 Kg Total Fill Vol 24 5700 ml PD Type PD with Cycler Total Fills 3 Fills/Cycle @ 1900 ml Patient to be maintained on peritoneal dialysis while at home.  Wife will be performing treatments.   #. Anemia of CKD  Lab Results  Component Value Date   HGB 11.4 (L) 04/18/2019  Continue to monitor CBC as an outpatient.  #. SHPTH     Component Value Date/Time   PTH 56 12/03/2017 1443   Lab Results  Component Value Date   PHOS 3.7 04/18/2019  Phosphorus control was excellent.  Patient does not require binder therapy at home.  Continue to monitor bone metabolism parameters at home.  #.  Left hip fracture Status post pinning November 18 No peritoneal dialysis  available at [facility] . Patient and wife do not want to do hemodialysis as he has done poorly with HD in the past.  Discharge planning currently ongoing for home with home health    LOS: 5 Delia Sitar 11/23/20202:08 PM  [place], [place]

## 2019-04-19 NOTE — Progress Notes (Signed)
Patient discharged via wheelchair by staff. No s/s of distress noted.

## 2019-04-19 NOTE — Progress Notes (Signed)
Patient unable to leave due to vehicle not starting.  While wife worked with security for  assistance, patient was returned to his room.  Patient is not easily redirected with confusion, becoming verbally aggressive and agitated with assistance and with chair pad.  Pulled chair pad from his bottom and threw it on the floor.  CNO at the bedside to speak with patient as Probation officer called wife for update and to calm husband.  Patient calmed as wife spoke, wife reports AAA is on the way.  Tray ordered for patient. Patient apologized for taking his frustration out on staff.  Call bell in reach.  Will continue to assess.

## 2019-04-20 NOTE — Discharge Summary (Signed)
Ruben Hudson at [place] NAME: Ruben Hudson    MR#:  [id number]  DATE OF BIRTH:  [birth year]  DATE OF ADMISSION:  04/14/2019   ADMITTING PHYSICIAN: Rise Patience, MD  DATE OF DISCHARGE: 04/19/2019  3:15 PM  PRIMARY CARE PHYSICIAN: Kirk Ruths, MD   ADMISSION DIAGNOSIS:  Closed fracture of left hip, initial encounter ([place]) [S72.002A] Closed left hip fracture, initial encounter ([place]) [S72.002A] DISCHARGE DIAGNOSIS:  Principal Problem:   Closed left hip fracture, initial encounter ([place]) Active Problems:   Congestive heart failure ([place])   ESRD on dialysis ([place])   Atrial fibrillation, chronic (HCC)   Cough productive of clear sputum  SECONDARY DIAGNOSIS:   Past Medical History:  Diagnosis Date  . Acute pulmonary edema ([place]) 02/26/2013  . Anxiety   . Arthritis    little  . Benign prostatic hyperplasia with urinary obstruction 02/24/2012  . BP (high blood pressure) 10/31/2015  . Cataract extraction status of left eye   . Cataract extraction status of right eye   . Cerebrovascular accident (CVA) ([place]) 04/24/2012   affected right eye, limited peripheral vision  . Chronic kidney disease 07/25/2017   stage 3  . Congestive heart failure ([place]) 10/31/2015  . Dyspnea    uses continuous o2  . Dysrhythmia    AF  . HLD (hyperlipidemia) 10/31/2015  . Hydrocele 02/25/2012  . Myocardial infarction ([place])    per patient, small MI  . Renal insufficiency   . Unstable angina pectoris ([place]) 02/26/2013  . Urge incontinence of urine 05/30/2014  . Urinary urgency 10/31/2015   HOSPITAL COURSE:  Ruben Hudson a 85 y.o.malewithhistory of A. 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 Had a mechanical fall after blowing leaves in yard.  Had immediate hip pain.  Did not lose consciousness. Denied any chest pain or shortness of breath. In the ER CT left hip showed left hip fracture. On-call orthopedic surgeon and nephrologist were consulted. Labs revealed WBC of 15.3 potassium 4.3 bicarb 28.  EKG showed old LBBB.    Acute left hip fracture S/p cannulated pinning of the left hip by Dr. Rudene Christians -appropriate post-op recovery  Delirium superimposed on baseline dementia Seems resolved Delirium precautions:              -Lights and TV off, minimize interruptions at night             -Blinds open and lights on during day             -Glasses/hearing aid with patient             -Frequent reorientation             -Avoid sedation medications/[place] list medications   Atrial fibrillation, chronic Not on anticoagulation due to GI bleed.  Rate controlled -Continue amiodarone    ESRD on PD -Continue every other day home torsemide  Chronic systolic CHF LBBB Chronic respiratory failure with hypoxia EF 20% in 2019.  Appears euvolemic.  Hypothyroidism -Continue levothyroxine  History of stroke -Continue aspirin  Prolonged QT interval ruled out LBBB corrected QTc is 450.  Recurrent syncope -Continue midodrine  Moderate protein calorie malnutrition -Continue Nepro  Acute blood loss anemia on chronic anemia of CKD Stable, expected postoperative anemia  DISCHARGE CONDITIONS:  stable CONSULTS OBTAINED:  Treatment Team:  Hessie Knows, MD DRUG ALLERGIES:   Allergies  Allergen Reactions  . Eliquis [Apixaban] Other (See Comments)    5 hours of nonstop nasal bleeding.  Required OVN stay in [place].  Occurred 04/2017  . Acyclovir And Related Other (See Comments)    Unknown   DISCHARGE MEDICATIONS:   Allergies as of 04/19/2019      Reactions   Eliquis [apixaban] Other (See Comments)   5 hours of nonstop nasal bleeding.  Required OVN  stay in [place].  Occurred 04/2017   Acyclovir And Related Other (See Comments)   Unknown      Medication List    TAKE these medications   amiodarone 200 MG tablet Commonly known as: PACERONE Take 200 mg by mouth daily. Notes to patient: Last dose given today at 9:11 AM   aspirin 81 MG tablet Take 81 mg by mouth daily. Notes to patient: Last dose given today at 9:10 AM   gentamicin cream 0.1 % Commonly known as: GARAMYCIN Apply 1 application topically daily. Apply to stomach catheter when changing. Notes to patient: Last dose given today at 10:10 AM   levothyroxine 50 MCG tablet Commonly known as: SYNTHROID Take 50 mcg by mouth daily. Notes to patient: Last dose given today at 8:27 AM   midodrine 10 MG tablet Commonly known as: PROAMATINE Take 10 mg by mouth 3 (three) times daily. Notes to patient: Last dose given today at 9:11 AM   multivitamin Tabs tablet Take 1 tablet by mouth daily. Notes to patient: Last dose given today at 9:11 AM   oxyCODONE 5 MG immediate release tablet Commonly known as: Oxy IR/ROXICODONE Take 1 tablet (5 mg total) by mouth every 6 (six) hours as needed for moderate pain or severe pain. Notes to patient: Last dose given yesterday at 5:33 AM   tamsulosin 0.4 MG Caps capsule Commonly known as: FLOMAX Take 0.4 mg by mouth every evening. Notes to patient: Last dose given today at 6:13 PM   torsemide 20 MG tablet Commonly known as: DEMADEX Take 40 mg by mouth daily. Notes to patient: Last dose given yesterday at 6:13 PM        DISCHARGE INSTRUCTIONS:  Follow-up at [facility] clinic in 4 weeks for [place] on staple removal 2 weeks post op at home with HHPT  Compression stockings bilateral lower extremities x6 weeks  DVT Prophylaxis - Foot Pumps, TED hose and Heparin Weight-Bearing as tolerated to left leg DIET:  Renal diet DISCHARGE CONDITION:  Stable ACTIVITY:  Activity as tolerated OXYGEN:  Home Oxygen: Yes.    Oxygen  Delivery: 2 liters/min via Patient connected to nasal cannula oxygen DISCHARGE LOCATION:  Home with HHPT   If you experience worsening of your admission symptoms, develop shortness of breath, life threatening emergency, suicidal or homicidal thoughts you must seek medical attention immediately by calling 911 or calling your MD immediately  if symptoms less severe.  You Must read complete instructions/literature along with all the possible adverse reactions/side effects for all the Medicines you take and that have been prescribed to you. Take any new Medicines after you have completely understood and accpet all the possible adverse reactions/side effects.   Please note  You were cared for by a hospitalist during your hospital stay. If you have any questions about your discharge medications or the care you received while  you were in the hospital after you are discharged, you can call the unit and asked to speak with the hospitalist on call if the hospitalist that took care of you is not available. Once you are discharged, your primary care physician will handle any further medical issues. Please note that NO REFILLS for any discharge medications will be authorized once you are discharged, as it is imperative that you return to your primary care physician (or establish a relationship with a primary care physician if you do not have one) for your aftercare needs so that they can reassess your need for medications and monitor your lab values.    On the day of Discharge:  VITAL SIGNS:  Blood pressure 126/60, pulse 67, temperature 98.4 F (36.9 C), temperature source Oral, resp. rate 17, height 5\' 10"  (1.778 m), weight 59.9 kg, SpO2 96 %. PHYSICAL EXAMINATION:  GENERAL:  85 y.o.-year-old patient lying in the bed with no acute distress.  EYES: Pupils equal, round, reactive to light and accommodation. No scleral icterus. Extraocular muscles intact.  HEENT: Head atraumatic, normocephalic. Oropharynx and  nasopharynx clear.  NECK:  Supple, no jugular venous distention. No thyroid enlargement, no tenderness.  LUNGS: Normal breath sounds bilaterally, no wheezing, rales,rhonchi or crepitation. No use of accessory muscles of respiration.  CARDIOVASCULAR: S1, S2 normal. No murmurs, rubs, or gallops.  ABDOMEN: Soft, non-tender, non-distended. Bowel sounds present. No organomegaly or mass.  EXTREMITIES: No pedal edema, cyanosis, or clubbing.  NEUROLOGIC: Cranial nerves II through XII are intact. Muscle strength 5/5 in all extremities. Sensation intact. Gait not checked.  PSYCHIATRIC: The patient is alert and oriented x 3.  SKIN: No obvious rash, lesion, or ulcer.  DATA REVIEW:   CBC Recent Labs  Lab 04/18/19 0445  WBC 6.7  HGB 11.4*  HCT 35.2*  PLT 191    Chemistries  Recent Labs  Lab 04/18/19 0445  NA 141  K 3.8  CL 102  CO2 29  GLUCOSE 109*  BUN 39*  CREATININE 2.78*  CALCIUM 8.4*     Follow-up Information    Duanne Guess, PA-C On 05/14/2019.   Specialties: Orthopedic Surgery, Emergency Medicine Why: for xrays. Home health to remove staples at home 2 weeks post op.; @ 3:15 pm Contact information: [place] [address] [phone]        Kirk Ruths, MD. Schedule an appointment as soon as possible for a visit on 04/27/2019.   Specialty: Internal Medicine Why: @ 1:30 pm Contact information: [address] [place] [address] [phone]           Management plans discussed with the patient, family and they are in agreement.  CODE STATUS: Prior   TOTAL TIME TAKING CARE OF THIS PATIENT: 45 minutes.    Max Sane M.D on 04/20/2019 at 11:37 AM  Between 7am to 6pm - Pager - [phone]  After 6pm go to [email or web address] - password [email or web address]  [facility]   CC: Primary care physician; Kirk Ruths, MD   Note: This dictation was prepared with Dragon dictation along with smaller  phrase technology. Any transcriptional errors that result from this process are unintentional.

## 2019-04-28 IMAGING — CR DG CHEST 1V PORT
1 series · 1 of 1 positions shown · non-contrast
Comparison: 08/23/2016 chest radiograph

CLINICAL DATA: 89 y/o  M; abdominal pain and mild hypoxia.

EXAM:
PORTABLE CHEST 1 VIEW

[dg chest port 1 view]
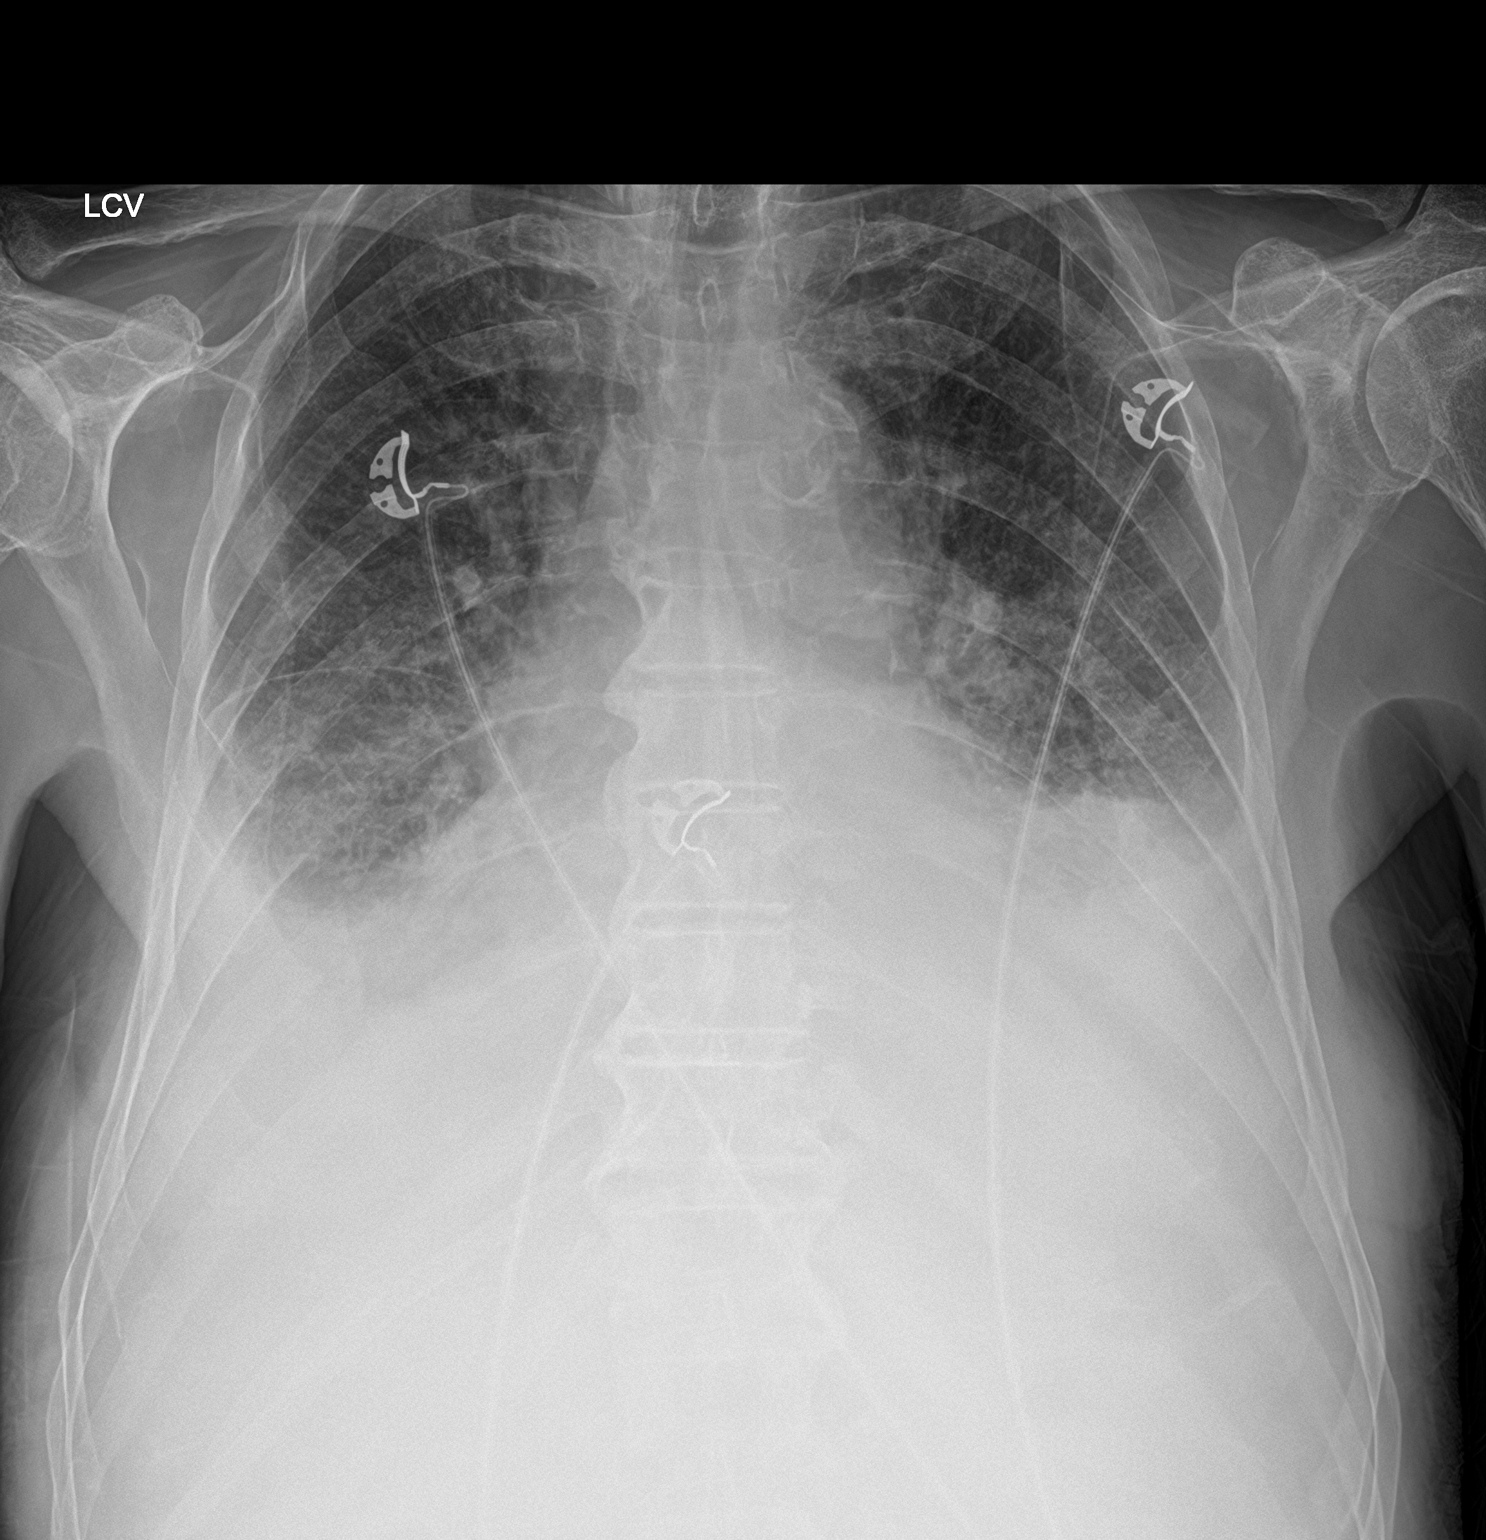

[1 of 1 positions shown; findings below may reference images not displayed]

FINDINGS: Stable cardiomegaly given projection and technique. Aortic
atherosclerosis with calcification. Small bilateral pleural
effusions and bibasilar opacities. Pulmonary vascular congestion. No
acute osseous abnormality is evident.
IMPRESSION: Stable cardiomegaly. Small bilateral pleural effusions and bibasilar
opacities probably representing associated atelectasis. Pulmonary
vascular congestion.

By: Gennaios Mankolli M.D.

## 2019-06-28 ENCOUNTER — Emergency Department: Payer: Medicare Other

## 2019-06-28 ENCOUNTER — Other Ambulatory Visit: Payer: Self-pay

## 2019-06-28 ENCOUNTER — Encounter: Payer: Self-pay | Admitting: Emergency Medicine

## 2019-06-28 ENCOUNTER — Emergency Department
Admission: EM | Admit: 2019-06-28 | Discharge: 2019-06-28 | Disposition: A | Discharge status: Home / Self Care | Payer: Medicare Other | Attending: Emergency Medicine | Admitting: Emergency Medicine

## 2019-06-28 DIAGNOSIS — Z992 Dependence on renal dialysis: Secondary | ICD-10-CM | POA: Diagnosis not present

## 2019-06-28 DIAGNOSIS — N189 Chronic kidney disease, unspecified: Secondary | ICD-10-CM | POA: Insufficient documentation

## 2019-06-28 DIAGNOSIS — K5641 Fecal impaction: Secondary | ICD-10-CM | POA: Diagnosis not present

## 2019-06-28 DIAGNOSIS — K6289 Other specified diseases of anus and rectum: Secondary | ICD-10-CM | POA: Diagnosis present

## 2019-06-28 DIAGNOSIS — I130 Hypertensive heart and chronic kidney disease with heart failure and stage 1 through stage 4 chronic kidney disease, or unspecified chronic kidney disease: Secondary | ICD-10-CM | POA: Diagnosis not present

## 2019-06-28 DIAGNOSIS — Z7982 Long term (current) use of aspirin: Secondary | ICD-10-CM | POA: Insufficient documentation

## 2019-06-28 DIAGNOSIS — I5023 Acute on chronic systolic (congestive) heart failure: Secondary | ICD-10-CM | POA: Insufficient documentation

## 2019-06-28 DIAGNOSIS — Z79899 Other long term (current) drug therapy: Secondary | ICD-10-CM | POA: Insufficient documentation

## 2019-06-28 DIAGNOSIS — Z87891 Personal history of nicotine dependence: Secondary | ICD-10-CM | POA: Insufficient documentation

## 2019-06-28 LAB — COMPREHENSIVE METABOLIC PANEL
ALT: 28 U/L (ref 0–44)
AST: 40 U/L (ref 15–41)
Albumin: 3.9 g/dL (ref 3.5–5.0)
Alkaline Phosphatase: 90 U/L (ref 38–126)
Anion gap: 16 — ABNORMAL HIGH (ref 5–15)
BUN: 49 mg/dL — ABNORMAL HIGH (ref 8–23)
CO2: 24 mmol/L (ref 22–32)
Calcium: 9 mg/dL (ref 8.9–10.3)
Chloride: 100 mmol/L (ref 98–111)
Creatinine, Ser: 3.45 mg/dL — ABNORMAL HIGH (ref 0.61–1.24)
GFR calc Af Amer: 17 mL/min — ABNORMAL LOW (ref >60)
GFR calc non Af Amer: 15 mL/min — ABNORMAL LOW (ref >60)
Glucose, Bld: 118 mg/dL — ABNORMAL HIGH (ref 70–99)
Potassium: 4.3 mmol/L (ref 3.5–5.1)
Sodium: 140 mmol/L (ref 135–145)
Total Bilirubin: 1.1 mg/dL (ref 0.3–1.2)
Total Protein: 7.7 g/dL (ref 6.5–8.1)

## 2019-06-28 LAB — CBC
HCT: 39.9 % (ref 39.0–52.0)
Hemoglobin: 12.9 g/dL — ABNORMAL LOW (ref 13.0–17.0)
MCH: 32 pg (ref 26.0–34.0)
MCHC: 32.3 g/dL (ref 30.0–36.0)
MCV: 99 fL (ref 80.0–100.0)
Platelets: 217 10*3/uL (ref 150–400)
RBC: 4.03 MIL/uL — ABNORMAL LOW (ref 4.22–5.81)
RDW: 14.5 % (ref 11.5–15.5)
WBC: 12.5 10*3/uL — ABNORMAL HIGH (ref 4.0–10.5)
nRBC: 0 % (ref 0.0–0.2)

## 2019-06-28 LAB — LIPASE, BLOOD: Lipase: 36 U/L (ref 11–51)

## 2019-06-28 MED ORDER — MAGNESIUM CITRATE PO SOLN: 1.0000 | Freq: Once | ORAL | 0 refills | Status: AC

## 2019-06-28 MED ORDER — LIDOCAINE 4 % EX CREA
TOPICAL_CREAM | Freq: Once | CUTANEOUS | Status: AC
  Administered 2019-06-28: 1 via TOPICAL
  Filled 2019-06-28: qty 5

## 2019-06-28 NOTE — ED Triage Notes (Signed)
Patient complaining of pain in rectum. States he fell recently and has had pain since. Also complaining of liquid bowel movements and some incontinence of urine. [facility].

## 2019-06-28 NOTE — Discharge Instructions (Addendum)
Please seek medical attention for any high fevers, chest pain, shortness of breath, change in behavior, persistent vomiting, bloody stool or any other new or concerning symptoms.  

## 2019-06-28 NOTE — ED Triage Notes (Signed)
First Nurse Note:  Arrives from [facility] for ED evaluation of probable fecal impaction.

## 2019-06-28 NOTE — ED Notes (Signed)
Patient assisted to bedside commode 25 minutes post enema. Patient is with wife and call light in reach. Patient is instructed not to get up until after this nurse has returned

## 2019-06-28 NOTE — ED Notes (Signed)
Patient assisted back to bed by nurse. Patient has had large amounts of BM post enema

## 2019-06-28 NOTE — ED Provider Notes (Signed)
[facility] Emergency Department Provider Note   ____________________________________________   First MD Initiated Contact with Patient 06/28/19 1716     (approximate)  I have reviewed the triage vital signs and the nursing notes.   HISTORY  Chief Complaint Rectal Pain    HPI Ruben Hudson is a 86 y.o. male here for evaluation of feeling like a ball is in his rectum and feeling like he is having stool around it  Patient reports that for a couple days now has been having pain in his rectum, it feels like there is a pressure or ball of stool there.  He has had liquid stool around it, but feels like he has stool stuck and cannot get out.  He has had no abdominal pain.  No nausea vomiting.  He reports all the pain is in around his rectum.  Also has a hemorrhoid in the area that is causing some pain.  No fevers or chills.  No exposure to Covid.  He is a peritoneal dialysis patient, he has been able to carry that without difficulty.  Denies difficulty urinating, but he has had increased urgency as of late  but only minimally urinates from time to time  Past Medical History:  Diagnosis Date  . Acute pulmonary edema ([place]) 02/26/2013  . Anxiety   . Arthritis    little  . Benign prostatic hyperplasia with urinary obstruction 02/24/2012  . BP (high blood pressure) 10/31/2015  . Cataract extraction status of left eye   . Cataract extraction status of right eye   . Cerebrovascular accident (CVA) ([place]) 04/24/2012   affected right eye, limited peripheral vision  . Chronic kidney disease 07/25/2017   stage 3  . Congestive heart failure ([place]) 10/31/2015  . Dyspnea    uses continuous o2  . Dysrhythmia    AF  . HLD (hyperlipidemia) 10/31/2015  . Hydrocele 02/25/2012  . Myocardial infarction ([place])    per patient, small MI  . Renal insufficiency   . Unstable angina pectoris ([place]) 02/26/2013  . Urge incontinence of urine 05/30/2014  . Urinary urgency 10/31/2015     Patient Active Problem List   Diagnosis Date Noted  . Cough productive of clear sputum   . Closed left hip fracture, initial encounter ([place]) 04/14/2019  . Atrial fibrillation, chronic ([place]) 04/14/2019  . Protein-calorie malnutrition, severe 12/04/2017  . Ventral hernia with bowel obstruction 12/01/2017  . Incarcerated umbilical hernia   . ESRD (end stage renal disease) ([place]) 08/01/2017  . ESRD on dialysis ([place]) 07/09/2017  . Severe epistaxis 04/07/2017  . Palliative care by specialist   . Goals of care, counseling/discussion   . Atrial fibrillation with RVR ([place]) 03/09/2017  . Recurrent umbilical hernia with incarceration   . Small bowel obstruction ([place])   . SOB (shortness of breath) 09/23/2016  . Acute on chronic systolic CHF (congestive heart failure) ([place]) 08/23/2016  . CKD (chronic kidney disease), stage III 08/23/2016  . Elevated troponin 08/23/2016  . Thrombocytopenia ([place]) 08/23/2016  . Hyperkalemia 11/06/2015  . Urinary urgency 10/31/2015  . Chronic kidney disease, stage 3 ([place]) 10/31/2015  . Congestive heart failure ([place]) 10/31/2015  . Hyperlipidemia 10/31/2015  . BP (high blood pressure) 10/31/2015  . Cerebrovascular accident (CVA) ([place]) 10/31/2015  . Phimosis 10/31/2015  . 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 Benign fibroma of prostate 05/30/2014  . Urge incontinence of urine 05/30/2014  . Chronic kidney disease, stage IV (severe) ([place]) 02/16/2014  . Acute pulmonary edema (  [place]) 02/26/2013  . Unstable angina pectoris ([place]) 02/26/2013  . Hydrocele 02/25/2012  . Benign prostatic hyperplasia with urinary obstruction 02/24/2012    Past Surgical History:  Procedure Laterality Date  . ANKLE FRACTURE SURGERY Right 2006   metal in ankle, screws and plate  . CAPD INSERTION N/A 08/01/2017   Procedure: LAPAROSCOPIC INSERTION CONTINUOUS AMBULATORY PERITONEAL DIALYSIS  (CAPD) CATHETER;  Surgeon: Katha Cabal, MD;  Location: [facility] ORS;  Service:  Vascular;  Laterality: N/A;  . CIRCUMCISION N/A 11/13/2015   Procedure: CIRCUMCISION ADULT;  Surgeon: Hollice Espy, MD;  Location: [facility] ORS;  Service: Urology;  Laterality: N/A;  . DIALYSIS/PERMA CATHETER INSERTION N/A 03/17/2017   Procedure: DIALYSIS/PERMA CATHETER INSERTION;  Surgeon: Algernon Huxley, MD;  Location: [place] [facility];  Service: Cardiovascular;  Laterality: N/A;  . DIALYSIS/PERMA CATHETER INSERTION N/A 12/03/2017   Procedure: DIALYSIS/PERMA CATHETER INSERTION;  Surgeon: Katha Cabal, MD;  Location: [place] [facility];  Service: Cardiovascular;  Laterality: N/A;  . DIALYSIS/PERMA CATHETER REMOVAL N/A 09/18/2017   Procedure: DIALYSIS/PERMA CATHETER REMOVAL;  Surgeon: Algernon Huxley, MD;  Location: [place] [facility];  Service: Cardiovascular;  Laterality: N/A;  . DIALYSIS/PERMA CATHETER REMOVAL N/A 01/12/2018   Procedure: DIALYSIS/PERMA CATHETER REMOVAL;  Surgeon: Algernon Huxley, MD;  Location: [place] [facility];  Service: Cardiovascular;  Laterality: N/A;  . EYE SURGERY Bilateral    cataract extractions  . HERNIA REPAIR    . HIP PINNING,CANNULATED Left 04/14/2019   Procedure: CANNULATED HIP PINNING, LEFT;  Surgeon: Hessie Knows, MD;  Location: [facility] ORS;  Service: Orthopedics;  Laterality: Left;  . HYDROCELE EXCISION Bilateral 11/13/2015   Procedure: HYDROCELECTOMY ADULT;  Surgeon: Hollice Espy, MD;  Location: [facility] ORS;  Service: Urology;  Laterality: Bilateral;  . TONSILLECTOMY  1950's  . TONSILLECTOMY    . [place]   with mesh  . VARICOCELECTOMY  1948  . VASECTOMY    . VENTRAL HERNIA REPAIR N/A 12/01/2017   Procedure: HERNIA REPAIR VENTRAL ADULT;  Surgeon: Jules Husbands, MD;  Location: [facility] ORS;  Service: General;  Laterality: N/A;    Prior to Admission medications   Medication Sig Start Date End Date Taking? Authorizing Provider  amiodarone (PACERONE) 200 MG tablet Take 200 mg by mouth daily.    [provider]  aspirin 81 MG  tablet Take 81 mg by mouth daily.  02/25/12   [provider]  gentamicin cream (GARAMYCIN) 0.1 % Apply 1 application topically daily. Apply to stomach catheter when changing. 09/03/17   [provider]  levothyroxine (SYNTHROID) 50 MCG tablet Take 50 mcg by mouth daily. 04/03/19   [provider]  midodrine (PROAMATINE) 10 MG tablet Take 10 mg by mouth 3 (three) times daily.     [provider]  multivitamin (RENA-VIT) TABS tablet Take 1 tablet by mouth daily. 06/17/17   [provider]  oxyCODONE (OXY IR/ROXICODONE) 5 MG immediate release tablet Take 1 tablet (5 mg total) by mouth every 6 (six) hours as needed for moderate pain or severe pain. 04/18/19   Reche Dixon, PA-C  tamsulosin (FLOMAX) 0.4 MG CAPS capsule Take 0.4 mg by mouth every evening.     [provider]  torsemide (DEMADEX) 20 MG tablet Take 40 mg by mouth daily. 03/15/19   [provider]    Allergies Eliquis [apixaban] and Acyclovir and related  Family History  Problem Relation Age of Onset  . Kidney cancer Neg Hx   . Prostate  cancer Neg Hx     Social History Social History   Tobacco Use  . Smoking status: Former Smoker    Packs/day: 1.00    Years: 20.00    Pack years: 20.00    Types: Cigarettes    Quit date: 11/03/1954    Years since quitting: 64.6  . Smokeless tobacco: Never Used  . Tobacco comment: started smoking at 86 years old  Substance Use Topics  . Alcohol use: No    Alcohol/week: 8.0 standard drinks    Types: 7 Shots of liquor, 1 Standard drinks or equivalent per week    Comment: 1 shot of gin a day.no gin x 1 year since dialysis  . Drug use: No    Review of Systems Constitutional: No fever/chills  Cardiovascular: Denies chest pain. Respiratory: Denies shortness of breath. Gastrointestinal: No abdominal pain.  See HPI, rectal discomfort and feels like a ball in his rectum. Genitourinary: Negative for dysuria.  Has had increased urgency to  urinate.  Urinates infrequently. Musculoskeletal: Negative for back pain. Skin: Negative for rash. Neurological: Negative for headaches.    ____________________________________________   PHYSICAL EXAM:  VITAL SIGNS: ED Triage Vitals [06/28/19 1555]  Enc Vitals Group     BP (!) 141/68     Pulse Rate 86     Resp 16     Temp 97.8 F (36.6 C)     Temp Source Oral     SpO2 98 %     Weight 138 lb (62.6 kg)     Height 5\' 9"  (1.753 m)     Head Circumference      Peak Flow      Pain Score 0     Pain Loc      Pain Edu?      Excl. in [place]?     Constitutional: Alert and oriented. Well appearing and in no acute distress. Eyes: Conjunctivae are normal. Head: Atraumatic. Mouth/Throat: Mucous membranes are moist. Neck: No stridor.  Cardiovascular: Normal rate, regular rhythm. Grossly normal heart sounds.  Good peripheral circulation. Respiratory: Normal respiratory effort.  No retractions. Lungs CTAB. Gastrointestinal: Soft and nontender. No distention.  Circumcised normal-appearing penis and testicles.  Peritoneal dialysis catheter clean dry intact. Rectal exam performed with nurse, he does have a small slightly tender external hemorrhoid.  Additional patient has brown formed stool in the rectal vault, large in quantity somewhat soft but difficult to disimpact.  No blood. Musculoskeletal: No lower extremity tenderness nor edema. Neurologic:  Normal speech and language. No gross focal neurologic deficits are appreciated.  Skin:  Skin is warm, dry and intact. No rash noted. Psychiatric: Mood and affect are normal. Speech and behavior are normal.  ____________________________________________   LABS (all labs ordered are listed, but only abnormal results are displayed)  Labs Reviewed  COMPREHENSIVE METABOLIC PANEL - Abnormal; Notable for the following components:      Result Value   Glucose, Bld 118 (*)    BUN 49 (*)    Creatinine, Ser 3.45 (*)    GFR calc non Af Amer 15 (*)     GFR calc Af Amer 17 (*)    Anion gap 16 (*)    All other components within normal limits  CBC - Abnormal; Notable for the following components:   WBC 12.5 (*)    RBC 4.03 (*)    Hemoglobin 12.9 (*)    All other components within normal limits  LIPASE, BLOOD  URINALYSIS, COMPLETE (UACMP) WITH MICROSCOPIC   ____________________________________________  EKG   ____________________________________________  RADIOLOGY  DG Abd 2 Views  Result Date: 06/28/2019 CLINICAL DATA:  Rectal pain, liquid bowel movements. EXAM: ABDOMEN - 2 VIEW COMPARISON:  12/01/2017 FINDINGS: Heart size likely enlarged. Lung bases are clear. No free air beneath either right or left hemidiaphragm on upright view. No signs of air-fluid level on upright view with limited amount of small bowel gas. Signs of stool and gas in the colon and in the rectum. Peritoneal dialysis catheter in place, projecting over low abdomen, tip coiled in right lower quadrant. No acute bone finding. Spinal degenerative changes. Signs of left femoral ORIF and right inguinal herniorrhaphy. IMPRESSION: Limited amount of small bowel gas. No signs of obstruction or free air. Signs of stool and gas in the colon and rectum. Peritoneal dialysis catheter in place. Electronically Signed   By: Zetta Bills M.D.   On: 06/28/2019 18:06    Imaging reviewed, no obstructive pathology noted. ____________________________________________   PROCEDURES  Procedure(s) performed: None  Procedures  Critical Care performed: No  ____________________________________________   INITIAL IMPRESSION / ASSESSMENT AND PLAN / ED COURSE  Pertinent labs & imaging results that were available during my care of the patient were reviewed by me and considered in my medical decision making (see chart for details).   Patient returns for evaluation of symptoms consistent with fecal impaction.  Able to only obtain a very small amount of stool, it is soft but difficult to  disimpact.  Patient also reporting significant pain because of hemorrhoid in the area.  Clinical Course as of Jun 27 2001  Mon Jun 28, 2019  1733 Rectal disimpaction attempted, has stool in the rectal vault, but is soft and not obviously impacted but does appear large in volume.  He is certainly tender in the rectal area though.  Will obtain imaging to further evaluate, may need enema.   [MQ]    Clinical Course User Index [MQ] Delman Kitten, MD   Imaging and symptomatology reassuring for no evidence of obstruction or perforation.  Will trial soapsuds enema.  Lab work reviewed, appears consistent with his known renal function and peritoneal dialysis status.  ----------------------------------------- 8:03 PM on 06/28/2019 -----------------------------------------  Ongoing ER care assigned to Dr. Archie Balboa.  Patient just receiving soapsuds enema, currently allowing it to do well.  Reassess thereafter, treating for fecal impaction.  Anticipate likely discharge, especially if the patient is to have a good bowel movement.  Symptoms all seem likely attributable to this as well as external hemorrhoid. ____________________________________________   FINAL CLINICAL IMPRESSION(S) / ED DIAGNOSES  Final diagnoses:  Fecal impaction in rectum [facility])        Note:  This document was prepared using Dragon voice recognition software and may include unintentional dictation errors       Delman Kitten, MD 06/28/19 2004

## 2019-06-28 NOTE — ED Notes (Signed)
Patient requested to return to bed due to believing he was done with his bowel movement. Patient was assisted to bed where he was cleaned and new briefs were placed for patient. Patient immediately soiled the new linens and brief with stool, patients was again changed and linens were changed. Patient again had a large amount of stool cover himself and bed prior to nurse leaving room. Patient was then recommended by this nurse the return to bedside commode for more time to continue to pass BM. Patient experienced BM while standing going to commode. Floor was cleaned by this nurse

## 2019-07-08 ENCOUNTER — Other Ambulatory Visit: Payer: Self-pay

## 2019-07-08 ENCOUNTER — Encounter: Payer: Self-pay | Admitting: Urology

## 2019-07-08 ENCOUNTER — Ambulatory Visit: Payer: Medicare Other | Admitting: Urology

## 2019-07-08 VITALS — BP 100/49 | HR 55 | Ht 69.0 in | Wt 136.0 lb

## 2019-07-08 DIAGNOSIS — R3915 Urgency of urination: Secondary | ICD-10-CM | POA: Diagnosis not present

## 2019-07-08 DIAGNOSIS — N401 Enlarged prostate with lower urinary tract symptoms: Secondary | ICD-10-CM | POA: Diagnosis not present

## 2019-07-08 DIAGNOSIS — N138 Other obstructive and reflux uropathy: Secondary | ICD-10-CM

## 2019-07-08 DIAGNOSIS — Z8673 Personal history of transient ischemic attack (TIA), and cerebral infarction without residual deficits: Secondary | ICD-10-CM | POA: Insufficient documentation

## 2019-07-08 LAB — MICROSCOPIC EXAMINATION
Bacteria, UA: NONE SEEN
RBC, Urine: NONE SEEN /hpf (ref 0–2)

## 2019-07-08 LAB — URINALYSIS, COMPLETE
Bilirubin, UA: NEGATIVE
Glucose, UA: NEGATIVE
Ketones, UA: NEGATIVE
Leukocytes,UA: NEGATIVE
Nitrite, UA: NEGATIVE
Protein,UA: NEGATIVE
RBC, UA: NEGATIVE
Specific Gravity, UA: 1.02 (ref 1.005–1.030)
Urobilinogen, Ur: 0.2 mg/dL (ref 0.2–1.0)
pH, UA: 7.5 (ref 5.0–7.5)

## 2019-07-08 MED ORDER — TAMSULOSIN HCL 0.4 MG PO CAPS: 0.8000 mg | ORAL_CAPSULE | Freq: Every day | ORAL | 3 refills | Status: DC

## 2019-07-08 NOTE — Progress Notes (Signed)
   07/08/19 4:54 PM   Ruben Hudson [birth year] [id number]  Referring provider: Kirk Ruths, MD [facility] [place],  [place] [address]  CC: Urinary frequency  HPI: I saw Ruben Hudson in urology clinic today for urinary frequency.  He was previously followed by Dr. Erlene Quan and Dr. Pilar Jarvis, and was last seen in our office in July 2018 for urinary retention.  He is an extremely comorbid 86 year old male with history notable for stroke, renal failure on peritoneal dialysis, congestive heart failure, and baseline oxygen use who reports worsening urinary frequency over the last 2 weeks.  He felt like this started in early February when he was seen in the ER for fecal impaction.  He does think that after he left the ER and was disimpacted his urination has improved some, and over the last 48 hours it has improved significantly.  His primary complaint is urinary frequency every 2 hours during the day, weak stream, nocturia 4-5 times per night, and incontinence overnight.  He denies any gross hematuria or flank pain.  He is on Flomax 0.4 mg nightly at baseline.  Urinalysis today is benign with 0-5 WBCs, 0 RBCs, no bacteria, no yeast, nitrite negative.  PVR is mildly elevated at 172ml.   Last cross-sectional imaging was July 2019 which showed a 46 g prostate with no hydronephrosis or other urologic abnormalities.  I had a very long conversation with the patient about his urinary symptoms and likely BPH with incomplete emptying, as well as exacerbation of his urinary symptoms with recent fecal impaction.  We discussed options including increasing his dose of Flomax, addition of finasteride, intermittent catheterization, or chronic Foley.  He is adamantly opposed to a chronic Foley catheter.  We discussed the risks of retention, UTI, incomplete emptying, and sepsis.  We discussed return precautions at length including inability to urinate, gross hematuria, or fevers over  101.3.  Increase Flomax to 0.8 mg nightly, risk of lightheadedness/dizziness discussed RTC 8 weeks for PVR to confirm emptying, sooner if problems  I spent 45 minutes on the day of the encounter including pre-visit review of the medical record, face-to-face time with the patient, and post visit ordering of labs/imaging/tests.  Nickolas Madrid, MD 07/08/2019  [facility] [address], [place], [place] [address] [phone]

## 2019-07-08 NOTE — Patient Instructions (Signed)

## 2019-09-02 ENCOUNTER — Ambulatory Visit: Payer: Self-pay | Admitting: Urology

## 2019-09-08 ENCOUNTER — Encounter: Payer: Self-pay | Admitting: Urology

## 2019-09-08 ENCOUNTER — Other Ambulatory Visit: Payer: Self-pay

## 2019-09-08 ENCOUNTER — Ambulatory Visit: Payer: Medicare Other | Admitting: Urology

## 2019-09-08 VITALS — BP 97/58 | HR 58 | Ht 71.0 in | Wt 141.0 lb

## 2019-09-08 DIAGNOSIS — N401 Enlarged prostate with lower urinary tract symptoms: Secondary | ICD-10-CM | POA: Diagnosis not present

## 2019-09-08 DIAGNOSIS — N138 Other obstructive and reflux uropathy: Secondary | ICD-10-CM | POA: Diagnosis not present

## 2019-09-08 LAB — BLADDER SCAN AMB NON-IMAGING: Scan Result: 35

## 2019-09-08 NOTE — Progress Notes (Signed)
   09/08/2019 5:55 PM   Ruben Hudson 12/03/1925 [id number]  Reason for visit: Follow up urinary symptoms  HPI: I saw Ruben Hudson back in urology clinic for his urinary symptoms.  He is an extremely comorbid 86 year old male with history notable for stroke, renal failure on peritoneal dialysis, congestive heart failure, baseline oxygen use who will has a history of urinary retention.  I originally met him in February 2021 when he was complaining of urinary frequency.  This improved significantly after he underwent fecal disimpaction in the ER.  He was having some bothersome frequency during the day at that time every 2 hours and nocturia 4-5 times per night as well as some incontinence.  His PVR was mildly elevated at that time at 183 mL.  Prior cross-sectional imaging in July 2019 showed a 46 g prostate with no hydronephrosis or other urologic abnormalities.  We had opted for a trial of increasing his Flomax to 0.8 mg nightly.  He is noticed a remarkable difference since increasing his Flomax dose.  He is emptying well with a PVR of 35 mL today, and is not having any further nocturia.  He is minimally bothered by his urinary symptoms aside from some occasional postvoid dribbling.  Considering his age and numerous comorbidities, I think he is doing remarkably well from a urinary perspective.  Continue Flomax 0.8 mg nightly, RTC 1 year for PVR and symptom check, sooner if problems  I spent 30 total minutes on the day of the encounter including pre-visit review of the medical record, face-to-face time with the patient, and post visit ordering of labs/imaging/tests.  Ruben Hudson, [facility] [address] [place], [place] [address] [phone]

## 2019-09-15 ENCOUNTER — Other Ambulatory Visit: Payer: Self-pay

## 2019-09-15 ENCOUNTER — Other Ambulatory Visit: Payer: Self-pay | Admitting: Nephrology

## 2019-09-15 ENCOUNTER — Ambulatory Visit
Admission: RE | Admit: 2019-09-15 | Discharge: 2019-09-15 | Disposition: A | Discharge status: Home / Self Care | Payer: Medicare Other | Source: Ambulatory Visit | Attending: Nephrology | Admitting: Nephrology

## 2019-09-15 DIAGNOSIS — R52 Pain, unspecified: Secondary | ICD-10-CM

## 2019-09-15 DIAGNOSIS — R079 Chest pain, unspecified: Secondary | ICD-10-CM | POA: Insufficient documentation

## 2019-11-05 ENCOUNTER — Other Ambulatory Visit: Payer: Self-pay | Admitting: Urology

## 2019-11-05 DIAGNOSIS — N138 Other obstructive and reflux uropathy: Secondary | ICD-10-CM

## 2020-04-12 ENCOUNTER — Other Ambulatory Visit: Payer: Self-pay | Admitting: Nephrology

## 2020-04-12 ENCOUNTER — Other Ambulatory Visit: Payer: Self-pay

## 2020-04-12 ENCOUNTER — Ambulatory Visit
Admission: RE | Admit: 2020-04-12 | Discharge: 2020-04-12 | Disposition: A | Discharge status: Home / Self Care | Payer: Medicare Other | Attending: Nephrology | Admitting: Nephrology

## 2020-04-12 ENCOUNTER — Ambulatory Visit
Admission: RE | Admit: 2020-04-12 | Discharge: 2020-04-12 | Disposition: A | Discharge status: Home / Self Care | Payer: Medicare Other | Source: Ambulatory Visit | Attending: Nephrology | Admitting: Nephrology

## 2020-04-12 DIAGNOSIS — R0602 Shortness of breath: Secondary | ICD-10-CM | POA: Insufficient documentation

## 2020-04-12 DIAGNOSIS — N186 End stage renal disease: Secondary | ICD-10-CM

## 2020-05-12 ENCOUNTER — Other Ambulatory Visit: Payer: Self-pay

## 2020-05-12 ENCOUNTER — Ambulatory Visit
Admission: RE | Admit: 2020-05-12 | Discharge: 2020-05-12 | Disposition: A | Discharge status: Home / Self Care | Payer: Medicare Other | Source: Ambulatory Visit | Attending: Nephrology | Admitting: Nephrology

## 2020-05-12 ENCOUNTER — Other Ambulatory Visit: Payer: Self-pay | Admitting: Nephrology

## 2020-05-12 DIAGNOSIS — J90 Pleural effusion, not elsewhere classified: Secondary | ICD-10-CM | POA: Insufficient documentation

## 2020-05-12 DIAGNOSIS — N401 Enlarged prostate with lower urinary tract symptoms: Secondary | ICD-10-CM | POA: Insufficient documentation

## 2020-05-12 DIAGNOSIS — R338 Other retention of urine: Secondary | ICD-10-CM | POA: Diagnosis not present

## 2020-05-12 DIAGNOSIS — R339 Retention of urine, unspecified: Secondary | ICD-10-CM

## 2020-05-12 DIAGNOSIS — N261 Atrophy of kidney (terminal): Secondary | ICD-10-CM | POA: Insufficient documentation

## 2020-05-25 ENCOUNTER — Emergency Department
Admission: EM | Admit: 2020-05-25 | Discharge: 2020-05-25 | Disposition: A | Discharge status: Home / Self Care | Payer: Medicare Other | Attending: Emergency Medicine | Admitting: Emergency Medicine

## 2020-05-25 ENCOUNTER — Emergency Department: Payer: Medicare Other

## 2020-05-25 ENCOUNTER — Other Ambulatory Visit: Payer: Self-pay

## 2020-05-25 DIAGNOSIS — Z5321 Procedure and treatment not carried out due to patient leaving prior to being seen by health care provider: Secondary | ICD-10-CM | POA: Insufficient documentation

## 2020-05-25 DIAGNOSIS — R0602 Shortness of breath: Secondary | ICD-10-CM | POA: Diagnosis not present

## 2020-05-25 DIAGNOSIS — R0789 Other chest pain: Secondary | ICD-10-CM | POA: Diagnosis not present

## 2020-05-25 LAB — CBC
HCT: 43.8 % (ref 39.0–52.0)
Hemoglobin: 14.2 g/dL (ref 13.0–17.0)
MCH: 32.1 pg (ref 26.0–34.0)
MCHC: 32.4 g/dL (ref 30.0–36.0)
MCV: 99.1 fL (ref 80.0–100.0)
Platelets: 189 10*3/uL (ref 150–400)
RBC: 4.42 MIL/uL (ref 4.22–5.81)
RDW: 15.7 % — ABNORMAL HIGH (ref 11.5–15.5)
WBC: 7.5 10*3/uL (ref 4.0–10.5)
nRBC: 0 % (ref 0.0–0.2)

## 2020-05-25 LAB — BASIC METABOLIC PANEL
Anion gap: 17 — ABNORMAL HIGH (ref 5–15)
BUN: 39 mg/dL — ABNORMAL HIGH (ref 8–23)
CO2: 25 mmol/L (ref 22–32)
Calcium: 8.5 mg/dL — ABNORMAL LOW (ref 8.9–10.3)
Chloride: 95 mmol/L — ABNORMAL LOW (ref 98–111)
Creatinine, Ser: 5.69 mg/dL — ABNORMAL HIGH (ref 0.61–1.24)
GFR, Estimated: 9 mL/min — ABNORMAL LOW (ref >60)
Glucose, Bld: 93 mg/dL (ref 70–99)
Potassium: 4.4 mmol/L (ref 3.5–5.1)
Sodium: 137 mmol/L (ref 135–145)

## 2020-05-25 LAB — TROPONIN I (HIGH SENSITIVITY)
Troponin I (High Sensitivity): 59 ng/L — ABNORMAL HIGH (ref <18)
Troponin I (High Sensitivity): 56 ng/L — ABNORMAL HIGH (ref <18)

## 2020-05-25 NOTE — ED Triage Notes (Signed)
Pt comes via POV from home with c/o SOB and CP. Pt states he wears 3L . Pt states this started 2 years ago. Pt states mid sternal chest pain. Pt states it is severe.

## 2020-05-25 NOTE — ED Notes (Signed)
Pt placed on hospital O2 [place] and tank at this time.

## 2020-05-28 ENCOUNTER — Other Ambulatory Visit: Payer: Self-pay

## 2020-05-28 ENCOUNTER — Emergency Department: Payer: Medicare Other

## 2020-05-28 ENCOUNTER — Encounter: Payer: Self-pay | Admitting: Emergency Medicine

## 2020-05-28 ENCOUNTER — Emergency Department
Admission: EM | Admit: 2020-05-28 | Discharge: 2020-05-28 | Disposition: A | Discharge status: Home / Self Care | Payer: Medicare Other | Attending: Emergency Medicine | Admitting: Emergency Medicine

## 2020-05-28 DIAGNOSIS — Z79899 Other long term (current) drug therapy: Secondary | ICD-10-CM | POA: Insufficient documentation

## 2020-05-28 DIAGNOSIS — R0789 Other chest pain: Secondary | ICD-10-CM | POA: Diagnosis present

## 2020-05-28 DIAGNOSIS — I132 Hypertensive heart and chronic kidney disease with heart failure and with stage 5 chronic kidney disease, or end stage renal disease: Secondary | ICD-10-CM | POA: Insufficient documentation

## 2020-05-28 DIAGNOSIS — Z992 Dependence on renal dialysis: Secondary | ICD-10-CM | POA: Insufficient documentation

## 2020-05-28 DIAGNOSIS — W19XXXA Unspecified fall, initial encounter: Secondary | ICD-10-CM

## 2020-05-28 DIAGNOSIS — N186 End stage renal disease: Secondary | ICD-10-CM | POA: Diagnosis not present

## 2020-05-28 DIAGNOSIS — I509 Heart failure, unspecified: Secondary | ICD-10-CM | POA: Insufficient documentation

## 2020-05-28 DIAGNOSIS — Y92003 Bedroom of unspecified non-institutional (private) residence as the place of occurrence of the external cause: Secondary | ICD-10-CM | POA: Diagnosis not present

## 2020-05-28 DIAGNOSIS — Z7982 Long term (current) use of aspirin: Secondary | ICD-10-CM | POA: Insufficient documentation

## 2020-05-28 DIAGNOSIS — Z87891 Personal history of nicotine dependence: Secondary | ICD-10-CM | POA: Insufficient documentation

## 2020-05-28 LAB — BASIC METABOLIC PANEL
Anion gap: 12 (ref 5–15)
BUN: 39 mg/dL — ABNORMAL HIGH (ref 8–23)
CO2: 30 mmol/L (ref 22–32)
Calcium: 8.8 mg/dL — ABNORMAL LOW (ref 8.9–10.3)
Chloride: 95 mmol/L — ABNORMAL LOW (ref 98–111)
Creatinine, Ser: 5.71 mg/dL — ABNORMAL HIGH (ref 0.61–1.24)
GFR, Estimated: 9 mL/min — ABNORMAL LOW (ref >60)
Glucose, Bld: 123 mg/dL — ABNORMAL HIGH (ref 70–99)
Potassium: 4.1 mmol/L (ref 3.5–5.1)
Sodium: 137 mmol/L (ref 135–145)

## 2020-05-28 LAB — TROPONIN I (HIGH SENSITIVITY)
Troponin I (High Sensitivity): 57 ng/L — ABNORMAL HIGH (ref <18)
Troponin I (High Sensitivity): 52 ng/L — ABNORMAL HIGH (ref <18)

## 2020-05-28 LAB — CBC
HCT: 43.3 % (ref 39.0–52.0)
Hemoglobin: 14 g/dL (ref 13.0–17.0)
MCH: 32 pg (ref 26.0–34.0)
MCHC: 32.3 g/dL (ref 30.0–36.0)
MCV: 99.1 fL (ref 80.0–100.0)
Platelets: 177 10*3/uL (ref 150–400)
RBC: 4.37 MIL/uL (ref 4.22–5.81)
RDW: 15.6 % — ABNORMAL HIGH (ref 11.5–15.5)
WBC: 7.6 10*3/uL (ref 4.0–10.5)
nRBC: 0 % (ref 0.0–0.2)

## 2020-05-28 MED ORDER — TRAMADOL-ACETAMINOPHEN 37.5-325 MG PO TABS: 1.0000 | ORAL_TABLET | Freq: Four times a day (QID) | ORAL | 0 refills | Status: AC | PRN

## 2020-05-28 MED ORDER — TRAMADOL HCL 50 MG PO TABS
100.0000 mg | ORAL_TABLET | Freq: Once | ORAL | Status: AC
  Administered 2020-05-28: 100 mg via ORAL
  Filled 2020-05-28: qty 2

## 2020-05-28 MED ORDER — ACETAMINOPHEN 325 MG PO TABS
650.0000 mg | ORAL_TABLET | Freq: Once | ORAL | Status: AC
  Administered 2020-05-28: 650 mg via ORAL
  Filled 2020-05-28: qty 2

## 2020-05-28 NOTE — ED Triage Notes (Signed)
Pt in via EMS from [facility] with c/o fall. Pt slid out of chair and fell to the ground. Pt with chest wall pain that is positional. 110/68, HR 60, FSBS 127, Pt does dialysis daily at home and has completed his treatment today.

## 2020-05-28 NOTE — ED Triage Notes (Signed)
Pt to ED via [facility] from home for chest pain. Pt thinks he may have fell but isn't exactly sure. Pt is poor historian. Pt states that he is on O2 at home but not sure how much. Difficulty getting SpO2 reading.

## 2020-05-28 NOTE — ED Provider Notes (Signed)
[facility] Emergency Department Provider Note   ____________________________________________   Event Date/Time   First MD Initiated Contact with Patient 05/28/20 1928     (approximate)  I have reviewed the triage vital signs and the nursing notes.   HISTORY  Chief Complaint Chest Pain    HPI Ruben Hudson is a 87 y.o. male with a past medical history of end-stage renal disease on peritoneal dialysis, hypertension, anxiety/depression, and CHF who presents for chest pain that occurred after a mechanical fall in the bedroom landing on his left side.  Patient now describes aching, 8/10, intermittent, nonradiating anterior lower sternal chest pain that is worsened with laying flat or any movement.  States it is also worsened with palpation.  Patient denies associated lightheadedness, vertigo, nausea/vomiting/diarrhea, shortness of breath, dyspnea on exertion, or lower extremity edema         Past Medical History:  Diagnosis Date  . Acute pulmonary edema ([place]) 02/26/2013  . Anxiety   . Arthritis    little  . Benign prostatic hyperplasia with urinary obstruction 02/24/2012  . BP (high blood pressure) 10/31/2015  . Cataract extraction status of left eye   . Cataract extraction status of right eye   . Cerebrovascular accident (CVA) ([place]) 04/24/2012   affected right eye, limited peripheral vision  . Chronic kidney disease 07/25/2017   stage 3  . Congestive heart failure ([place]) 10/31/2015  . Dyspnea    uses continuous o2  . Dysrhythmia    AF  . HLD (hyperlipidemia) 10/31/2015  . Hydrocele 02/25/2012  . Myocardial infarction ([place])    per patient, small MI  . Renal insufficiency   . Unstable angina pectoris ([place]) 02/26/2013  . Urge incontinence of urine 05/30/2014  . Urinary urgency 10/31/2015    Patient Active Problem List   Diagnosis Date Noted  . History of arterial ischemic stroke 07/08/2019  . Cough productive of clear sputum   . Closed left hip  fracture, initial encounter ([place]) 04/14/2019  . Atrial fibrillation, chronic ([place]) 04/14/2019  . Memory loss due to medical condition 05/26/2018  . Ambulates with cane 01/21/2018  . Chronic respiratory failure with hypoxia ([place]) 12/23/2017  . Frail elderly 12/23/2017  . Protein-calorie malnutrition, severe 12/04/2017  . Ventral hernia with bowel obstruction 12/01/2017  . Incarcerated umbilical hernia   . ESRD (end stage renal disease) ([place]) 08/01/2017  . ESRD on dialysis ([place]) 07/09/2017  . Severe epistaxis 04/07/2017  . Palliative care by specialist   . Goals of care, counseling/discussion   . Atrial fibrillation with RVR ([place]) 03/09/2017  . Recurrent umbilical hernia with incarceration   . Small bowel obstruction ([place])   . SOB (shortness of breath) 09/23/2016  . Acute on chronic systolic CHF (congestive heart failure) ([place]) 08/23/2016  . CKD (chronic kidney disease), stage III ([place]) 08/23/2016  . Elevated troponin 08/23/2016  . Thrombocytopenia ([place]) 08/23/2016  . Hyperkalemia 11/06/2015  . Urinary urgency 10/31/2015  . Chronic kidney disease, stage 3 ([place]) 10/31/2015  . Congestive heart failure ([place]) 10/31/2015  . Hyperlipidemia 10/31/2015  . BP (high blood pressure) 10/31/2015  . Cerebrovascular accident (CVA) ([address]) 10/31/2015  . Phimosis 10/31/2015  . 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 Benign fibroma of prostate 05/30/2014  . Urge incontinence of urine 05/30/2014  . Chronic kidney disease, stage IV (severe) ([place]) 02/16/2014  . Acute pulmonary edema ([place]) 02/26/2013  . Unstable angina pectoris ([place]) 02/26/2013  . Hydrocele 02/25/2012  . Benign prostatic hyperplasia with urinary  obstruction 02/24/2012    Past Surgical History:  Procedure Laterality Date  . ANKLE FRACTURE SURGERY Right 2006   metal in ankle, screws and plate  . CAPD INSERTION N/A 08/01/2017   Procedure: LAPAROSCOPIC INSERTION CONTINUOUS AMBULATORY PERITONEAL DIALYSIS  (CAPD) CATHETER;   Surgeon: Katha Cabal, MD;  Location: [facility];  Service: Vascular;  Laterality: N/A;  . CIRCUMCISION N/A 11/13/2015   Procedure: CIRCUMCISION ADULT;  Surgeon: Hollice Espy, MD;  Location: [facility];  Service: Urology;  Laterality: N/A;  . DIALYSIS/PERMA CATHETER INSERTION N/A 03/17/2017   Procedure: DIALYSIS/PERMA CATHETER INSERTION;  Surgeon: Algernon Huxley, MD;  Location: [place] [facility];  Service: Cardiovascular;  Laterality: N/A;  . DIALYSIS/PERMA CATHETER INSERTION N/A 12/03/2017   Procedure: DIALYSIS/PERMA CATHETER INSERTION;  Surgeon: Katha Cabal, MD;  Location: [place] [facility];  Service: Cardiovascular;  Laterality: N/A;  . DIALYSIS/PERMA CATHETER REMOVAL N/A 09/18/2017   Procedure: DIALYSIS/PERMA CATHETER REMOVAL;  Surgeon: Algernon Huxley, MD;  Location: [place] [facility];  Service: Cardiovascular;  Laterality: N/A;  . DIALYSIS/PERMA CATHETER REMOVAL N/A 01/12/2018   Procedure: DIALYSIS/PERMA CATHETER REMOVAL;  Surgeon: Algernon Huxley, MD;  Location: [place] [facility];  Service: Cardiovascular;  Laterality: N/A;  . EYE SURGERY Bilateral    cataract extractions  . HERNIA REPAIR    . HIP PINNING,CANNULATED Left 04/14/2019   Procedure: CANNULATED HIP PINNING, LEFT;  Surgeon: Hessie Knows, MD;  Location: [facility];  Service: Orthopedics;  Laterality: Left;  . HYDROCELE EXCISION Bilateral 11/13/2015   Procedure: HYDROCELECTOMY ADULT;  Surgeon: Hollice Espy, MD;  Location: [facility];  Service: Urology;  Laterality: Bilateral;  . TONSILLECTOMY  1950's  . TONSILLECTOMY    . [place]   with mesh  . VARICOCELECTOMY  1948  . VASECTOMY    . VENTRAL HERNIA REPAIR N/A 12/01/2017   Procedure: HERNIA REPAIR VENTRAL ADULT;  Surgeon: Jules Husbands, MD;  Location: [facility];  Service: General;  Laterality: N/A;    Prior to Admission medications   Medication Sig Start Date End Date Taking? Authorizing Provider  traMADol-acetaminophen (ULTRACET) 37.5-325 MG  tablet Take 1 tablet by mouth every 6 (six) hours as needed for up to 3 days. 05/28/20 05/31/20 Yes Naaman Plummer, MD  amiodarone (PACERONE) 200 MG tablet Take 200 mg by mouth daily.    [provider]  aspirin 81 MG tablet Take 81 mg by mouth daily.  02/25/12   [provider]  gentamicin cream (GARAMYCIN) 0.1 % Apply 1 application topically daily. Apply to stomach catheter when changing. 09/03/17   [provider]  levothyroxine (SYNTHROID) 50 MCG tablet Take 50 mcg by mouth daily. 04/03/19   [provider]  midodrine (PROAMATINE) 10 MG tablet Take 10 mg by mouth 3 (three) times daily.     [provider]  multivitamin (RENA-VIT) TABS tablet Take 1 tablet by mouth daily. 06/17/17   [provider]  oxyCODONE (OXY IR/ROXICODONE) 5 MG immediate release tablet Take 1 tablet (5 mg total) by mouth every 6 (six) hours as needed for moderate pain or severe pain. Patient not taking: Reported on 09/08/2019 04/18/19   Reche Dixon, PA-C  tamsulosin (FLOMAX) 0.4 MG CAPS capsule Take 0.4 mg by mouth every evening.     [provider]  tamsulosin (FLOMAX) 0.4 MG CAPS capsule TAKE 2 CAPSULES BY MOUTH EVERY DAY 11/05/19   McGowan, Larene [place] A, PA-C  torsemide (DEMADEX) 20 MG tablet Take 40 mg by mouth  daily. 03/15/19   [provider]    Allergies Eliquis [apixaban] and Acyclovir and related  Family History  Problem Relation Age of Onset  . Kidney cancer Neg Hx   . Prostate cancer Neg Hx     Social History Social History   Tobacco Use  . Smoking status: Former Smoker    Packs/day: 1.00    Years: 20.00    Pack years: 20.00    Types: Cigarettes    Quit date: 11/03/1954    Years since quitting: 65.6  . Smokeless tobacco: Never Used  . Tobacco comment: started smoking at 87 years old  Vaping Use  . Vaping Use: Never used  Substance Use Topics  . Alcohol use: No    Alcohol/week: 8.0 standard drinks    Types: 7 Shots of liquor, 1  Standard drinks or equivalent per week    Comment: 1 shot of gin a day.no gin x 1 year since dialysis  . Drug use: No    Review of Systems Constitutional: No fever/chills Eyes: No visual changes. ENT: No sore throat. Cardiovascular: Endorses chest pain. Respiratory: Denies shortness of breath. Gastrointestinal: No abdominal pain.  No nausea, no vomiting.  No diarrhea. Genitourinary: Negative for dysuria. Musculoskeletal: Negative for acute arthralgias Skin: Negative for rash. Neurological: Negative for headaches, weakness/numbness/paresthesias in any extremity Psychiatric: Negative for suicidal ideation/homicidal ideation   ____________________________________________   PHYSICAL EXAM:  VITAL SIGNS: ED Triage Vitals  Enc Vitals Group     BP 05/28/20 0836 (!) 104/58     Pulse Rate 05/28/20 0844 60     Resp 05/28/20 0836 18     Temp 05/28/20 0836 97.6 F (36.4 C)     Temp Source 05/28/20 0836 Oral     SpO2 05/28/20 0844 100 %     Weight 05/28/20 0844 141 lb 1.5 oz (64 kg)     Height 05/28/20 0844 5\' 11"  (1.803 m)     Head Circumference --      Peak Flow --      Pain Score 05/28/20 0844 0     Pain Loc --      Pain Edu? --      Excl. in [place]? --    Constitutional: Alert and oriented. Well appearing and in no acute distress. Eyes: Conjunctivae are normal. PERRL. Head: Atraumatic. Nose: No congestion/rhinnorhea. Mouth/Throat: Mucous membranes are moist. Neck: No stridor Cardiovascular: Grossly normal heart sounds.  Good peripheral circulation. Respiratory: Normal respiratory effort.  No retractions. Gastrointestinal: Soft and nontender. No distention. Musculoskeletal: No obvious deformities.  Tenderness to palpation over anterior lower chest wall Neurologic:  Normal speech and language. No gross focal neurologic deficits are appreciated. Skin:  Skin is warm and dry. No rash noted. Psychiatric: Mood and affect are normal. Speech and behavior are  normal.  ____________________________________________   LABS (all labs ordered are listed, but only abnormal results are displayed)  Labs Reviewed  BASIC METABOLIC PANEL - Abnormal; Notable for the following components:      Result Value   Chloride 95 (*)    Glucose, Bld 123 (*)    BUN 39 (*)    Creatinine, Ser 5.71 (*)    Calcium 8.8 (*)    GFR, Estimated 9 (*)    All other components within normal limits  CBC - Abnormal; Notable for the following components:   RDW 15.6 (*)    All other components within normal limits  TROPONIN I (HIGH SENSITIVITY) - Abnormal; Notable for the following components:  Troponin I (High Sensitivity) 57 (*)    All other components within normal limits  TROPONIN I (HIGH SENSITIVITY) - Abnormal; Notable for the following components:   Troponin I (High Sensitivity) 52 (*)    All other components within normal limits   ____________________________________________  EKG  ED ECG REPORT I, Naaman Plummer, the attending physician, personally viewed and interpreted this ECG.  Date: 05/28/2020 EKG Time: 0828 Rate: 63 Rhythm: normal sinus rhythm QRS Axis: normal Intervals: normal ST/T Wave abnormalities: normal Narrative Interpretation: no evidence of acute ischemia  ____________________________________________  RADIOLOGY  ED MD interpretation: 2 view x-ray of the chest shows stable cardiac enlargement and interstitial edema as well as bilateral pleural effusions  Official radiology report(s): DG Chest 2 View  Result Date: 05/28/2020 CLINICAL DATA:  Chest pain after a fall. EXAM: CHEST - 2 VIEW COMPARISON:  05/25/2020 FINDINGS: Cardiac enlargement with perihilar and basilar interstitial changes, likely edema. Small bilateral pleural effusions with basilar atelectasis. Similar appearance to previous study. No pneumothorax. Calcification of the aorta. Degenerative changes in the spine. IMPRESSION: Cardiac enlargement with interstitial edema. Small  bilateral pleural effusions with basilar atelectasis. Electronically Signed   By: Lucienne Capers M.D.   On: 05/28/2020 20:22    ____________________________________________   PROCEDURES  Procedure(s) performed (including Critical Care):  .1-3 Lead EKG Interpretation Performed by: Naaman Plummer, MD Authorized by: Naaman Plummer, MD     Interpretation: normal     ECG rate:  66   ECG rate assessment: normal     Rhythm: sinus rhythm     Ectopy: none     Conduction: normal       ____________________________________________   INITIAL IMPRESSION / ASSESSMENT AND PLAN / ED COURSE  As part of my medical decision making, I reviewed the following data within the [place] notes reviewed and incorporated, Labs reviewed, EKG interpreted, Old chart reviewed, Radiograph reviewed and Notes from prior ED visits reviewed and incorporated        Workup: ECG, CXR, CBC, BMP, Troponin Findings: ECG: No overt evidence of STEMI. No evidence of Brugadas sign, delta wave, epsilon wave, significantly prolonged QTc, or malignant arrhythmia HS Troponin: Negative x1 Other Labs unremarkable for emergent problems. CXR: Without PTX, PNA, or widened mediastinum Last Stress Test:  2018 Last Heart Catheterization:  2018 HEART Score: 4  Given History, Exam, and Workup I have low suspicion for ACS, Pneumothorax, Pneumonia, Pulmonary Embolus, Tamponade, Aortic Dissection or other emergent problem as a cause for this presentation.   Reassesment: Prior to discharge patients pain was controlled and they were well appearing.  Disposition:  Discharge. Strict return precautions discussed with patient with full understanding. Advised patient to follow up promptly with primary care provider      ____________________________________________   FINAL CLINICAL IMPRESSION(S) / ED DIAGNOSES  Final diagnoses:  Chest wall pain  Fall, initial encounter     ED Discharge Orders          Ordered    traMADol-acetaminophen (ULTRACET) 37.5-325 MG tablet  Every 6 hours PRN        05/28/20 2137           Note:  This document was prepared using Dragon voice recognition software and may include unintentional dictation errors.   Naaman Plummer, MD 05/28/20 2138

## 2020-07-25 DEATH — deceased

## 2020-09-07 ENCOUNTER — Ambulatory Visit: Payer: Self-pay | Admitting: Urology

## 2022-06-01 IMAGING — CR DG CHEST 2V
1 series · 2 of 2 positions shown · non-contrast
Comparison: September 15, 2019.

CLINICAL DATA: Shortness of breath.

EXAM:
CHEST - 2 VIEW

[Series 1: dg chest 2 view · 0.14mm/px · 2 of 2 slices shown]
[im 1/2]
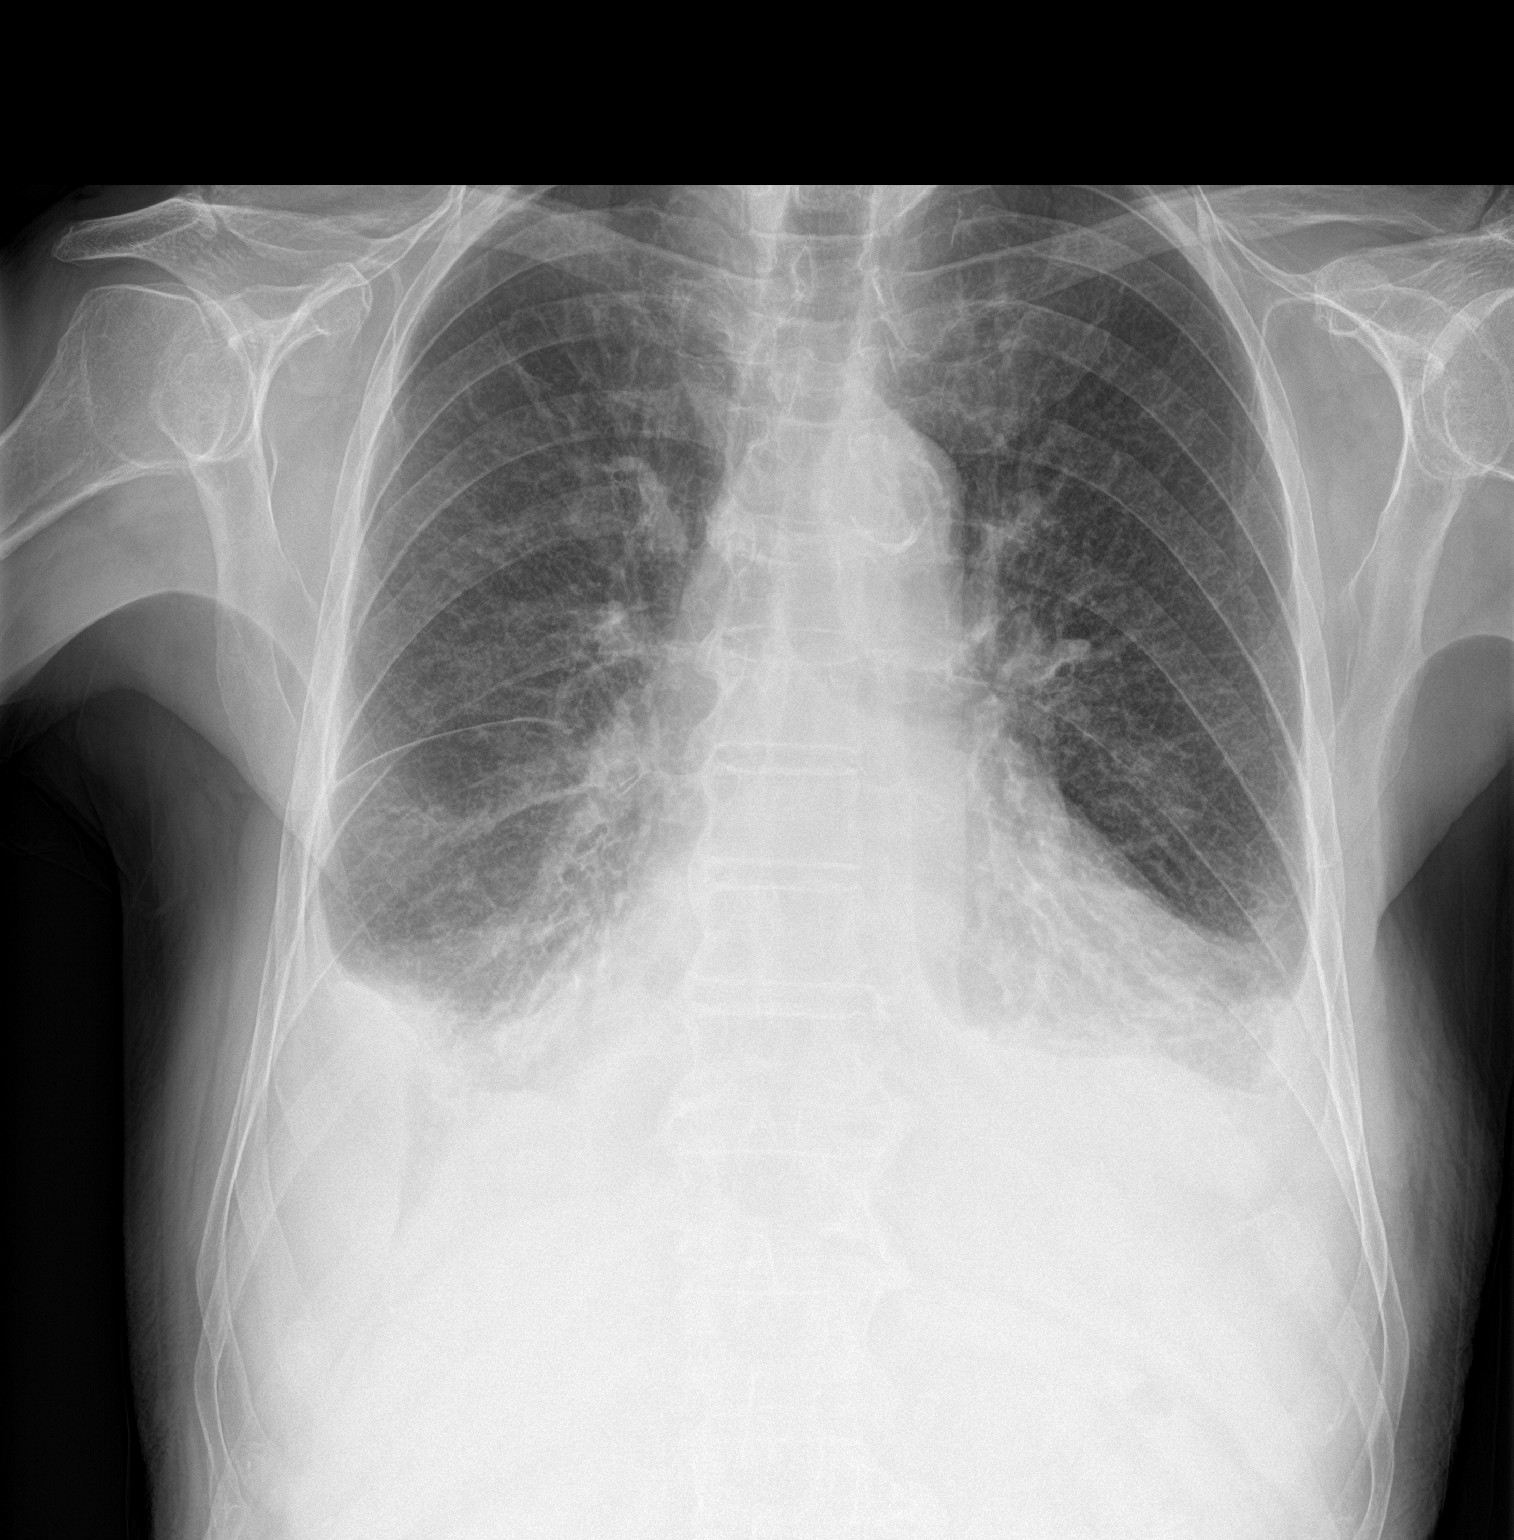
[im 2/2]
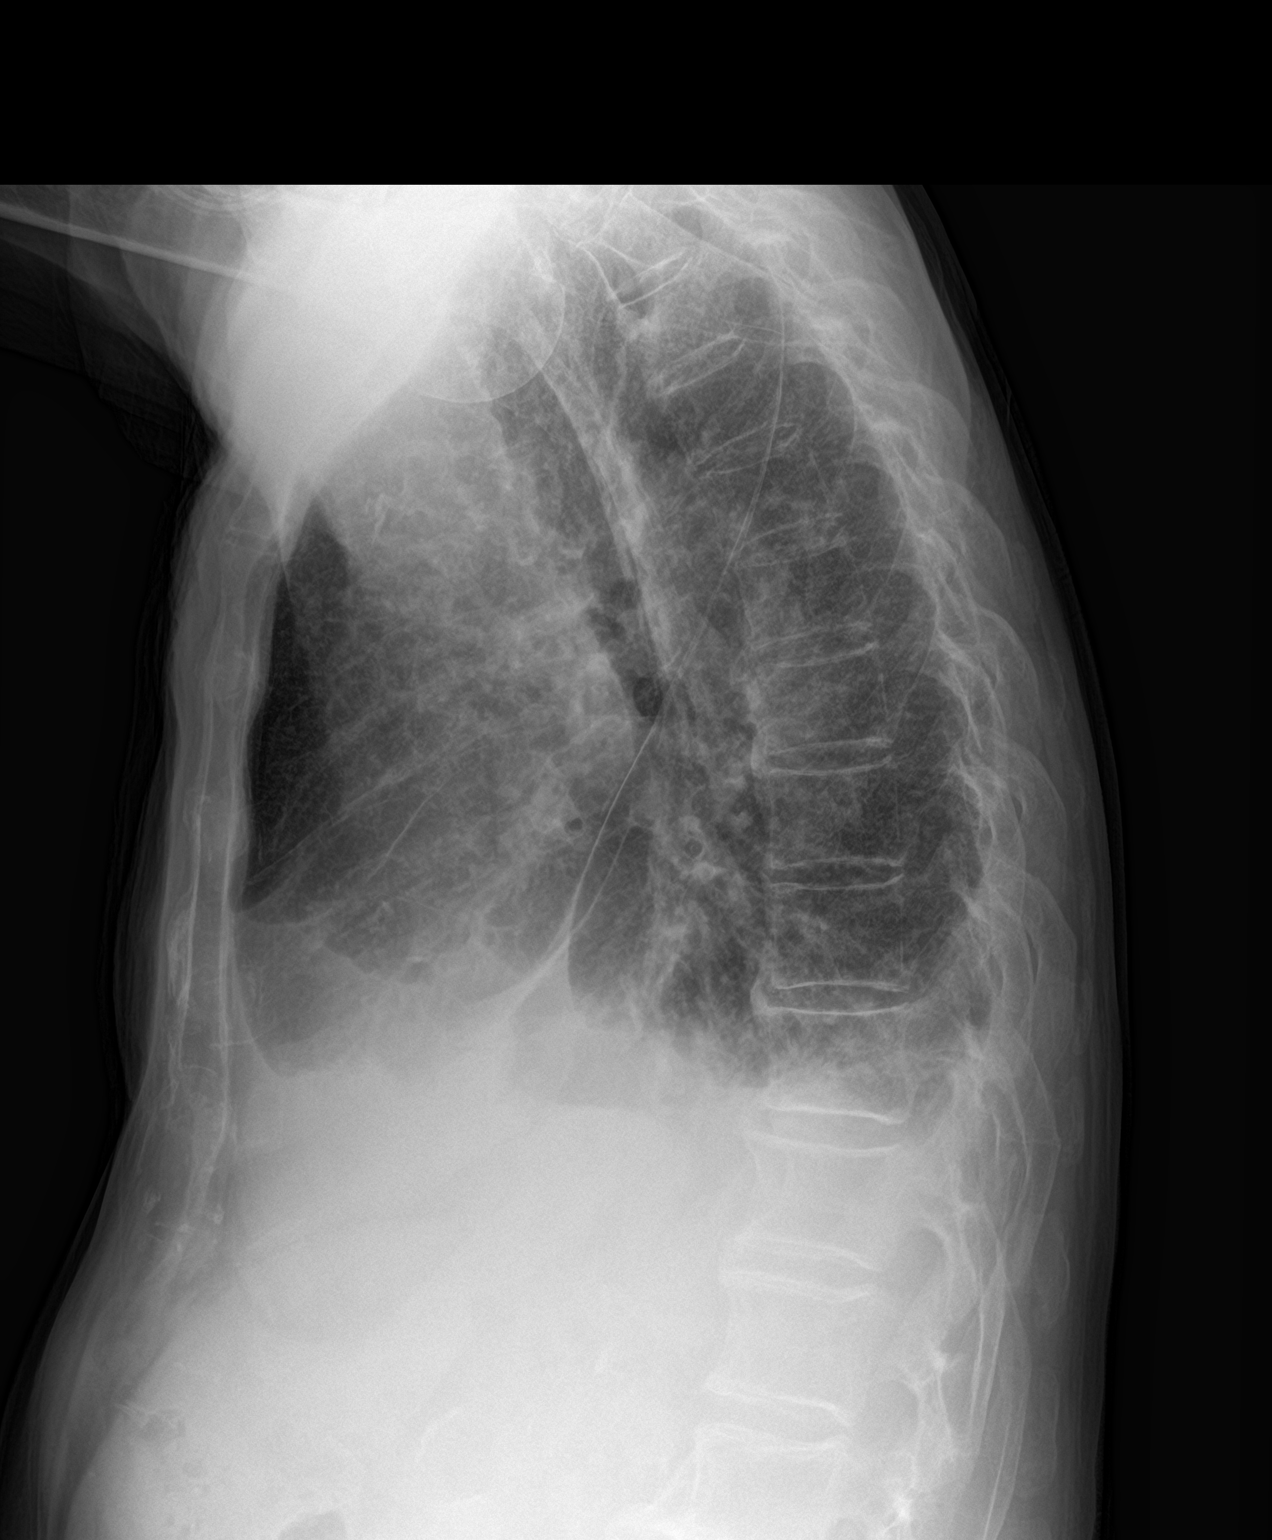

[2 of 2 positions shown; findings below may reference images not displayed]

FINDINGS: Stable cardiomediastinal silhouette. Increased bibasilar edema or
atelectasis is noted with associated increased pleural effusions.
Bony thorax is unremarkable.
IMPRESSION: Increased bibasilar edema or atelectasis is noted with associated
increased pleural effusions.
